# Patient Record
Sex: Male | Born: 1960 | Race: Black or African American | Hispanic: No | Marital: Married | Smoking: Current every day smoker
Health system: Southern US, Community
[De-identification: ages and names within clinical notes are randomized; demographics above are authoritative.]

## PROBLEM LIST (undated history)

## (undated) DIAGNOSIS — G809 Cerebral palsy, unspecified: Secondary | ICD-10-CM

## (undated) DIAGNOSIS — Z9889 Other specified postprocedural states: Secondary | ICD-10-CM

## (undated) DIAGNOSIS — M47812 Spondylosis without myelopathy or radiculopathy, cervical region: Secondary | ICD-10-CM

## (undated) DIAGNOSIS — R7303 Prediabetes: Secondary | ICD-10-CM

## (undated) DIAGNOSIS — Z8719 Personal history of other diseases of the digestive system: Secondary | ICD-10-CM

## (undated) DIAGNOSIS — M419 Scoliosis, unspecified: Secondary | ICD-10-CM

## (undated) DIAGNOSIS — M1990 Unspecified osteoarthritis, unspecified site: Secondary | ICD-10-CM

## (undated) DIAGNOSIS — M5412 Radiculopathy, cervical region: Secondary | ICD-10-CM

## (undated) DIAGNOSIS — M25511 Pain in right shoulder: Secondary | ICD-10-CM

## (undated) DIAGNOSIS — M549 Dorsalgia, unspecified: Secondary | ICD-10-CM

## (undated) DIAGNOSIS — G8929 Other chronic pain: Secondary | ICD-10-CM

## (undated) DIAGNOSIS — R262 Difficulty in walking, not elsewhere classified: Secondary | ICD-10-CM

## (undated) DIAGNOSIS — F17200 Nicotine dependence, unspecified, uncomplicated: Secondary | ICD-10-CM

## (undated) DIAGNOSIS — M791 Myalgia, unspecified site: Secondary | ICD-10-CM

## (undated) DIAGNOSIS — M545 Low back pain, unspecified: Secondary | ICD-10-CM

## (undated) HISTORY — PX: HERNIA REPAIR: SHX51

## (undated) HISTORY — PX: SHOULDER SURGERY: SHX246

## (undated) HISTORY — PX: LEG SURGERY: SHX1003

## (undated) HISTORY — PX: BACK SURGERY: SHX140

## (undated) HISTORY — DX: Unspecified osteoarthritis, unspecified site: M19.90

## (undated) HISTORY — PX: FRACTURE SURGERY: SHX138

## (undated) HISTORY — DX: Dorsalgia, unspecified: M54.9

## (undated) HISTORY — PX: TRACHEOSTOMY: SHX5626

## (undated) HISTORY — PX: HX KNEE SURGERY: 2100001320

## (undated) HISTORY — PX: HX BACK SURGERY: SHX140

---

## 1996-09-26 ENCOUNTER — Emergency Department
Admit: 1996-09-26 | Disposition: A | Discharge status: Home / Self Care | Payer: Self-pay | Admitting: Emergency Medicine

## 1996-12-14 ENCOUNTER — Other Ambulatory Visit: Payer: Self-pay

## 1996-12-14 ENCOUNTER — Ambulatory Visit: Admit: 1996-12-14 | Disposition: A | Discharge status: Home / Self Care | Payer: Self-pay | Admitting: Surgery

## 1997-01-19 ENCOUNTER — Ambulatory Visit: Admit: 1997-01-19 | Disposition: A | Discharge status: Home / Self Care | Payer: Self-pay | Admitting: Family Medicine

## 1997-10-04 ENCOUNTER — Ambulatory Visit: Admit: 1997-10-04 | Disposition: A | Discharge status: Home / Self Care | Payer: Self-pay | Admitting: Urology

## 1997-11-28 ENCOUNTER — Ambulatory Visit: Admit: 1997-11-28 | Disposition: A | Discharge status: Home / Self Care | Payer: Self-pay | Admitting: Urology

## 1998-04-29 ENCOUNTER — Emergency Department: Admit: 1998-04-29 | Disposition: A | Discharge status: Home / Self Care | Payer: Self-pay

## 1999-02-06 ENCOUNTER — Emergency Department
Admit: 1999-02-06 | Disposition: A | Discharge status: Home / Self Care | Payer: Self-pay | Admitting: Emergency Medicine

## 1999-10-07 DIAGNOSIS — Z8719 Personal history of other diseases of the digestive system: Secondary | ICD-10-CM

## 1999-10-07 DIAGNOSIS — Z9889 Other specified postprocedural states: Secondary | ICD-10-CM

## 1999-10-07 HISTORY — DX: Personal history of other diseases of the digestive system: Z87.19

## 1999-10-07 HISTORY — DX: Other specified postprocedural states: Z98.890

## 1999-11-28 ENCOUNTER — Emergency Department
Admit: 1999-11-28 | Disposition: A | Discharge status: Home / Self Care | Payer: Self-pay | Admitting: Emergency Medicine

## 2001-12-23 ENCOUNTER — Emergency Department
Admit: 2001-12-23 | Disposition: A | Discharge status: Home / Self Care | Payer: Self-pay | Admitting: Emergency Medicine

## 2002-03-30 ENCOUNTER — Ambulatory Visit: Admit: 2002-03-30 | Disposition: A | Discharge status: Home / Self Care | Payer: Self-pay | Admitting: Internal Medicine

## 2003-05-24 ENCOUNTER — Ambulatory Visit
Admission: AD | Admit: 2003-05-24 | Disposition: A | Discharge status: Home / Self Care | Payer: Self-pay | Source: Ambulatory Visit | Admitting: Family Medicine

## 2003-12-07 ENCOUNTER — Ambulatory Visit (INDEPENDENT_AMBULATORY_CARE_PROVIDER_SITE_OTHER): Admit: 2003-12-07 | Disposition: A | Discharge status: Home / Self Care | Payer: Self-pay | Source: Ambulatory Visit

## 2004-08-07 ENCOUNTER — Ambulatory Visit (INDEPENDENT_AMBULATORY_CARE_PROVIDER_SITE_OTHER): Admit: 2004-08-07 | Disposition: A | Discharge status: Home / Self Care | Payer: Self-pay | Source: Ambulatory Visit

## 2005-01-28 ENCOUNTER — Ambulatory Visit
Admission: RE | Admit: 2005-01-28 | Disposition: A | Discharge status: Home / Self Care | Payer: Self-pay | Source: Ambulatory Visit | Admitting: Cardiovascular Disease

## 2005-02-12 ENCOUNTER — Ambulatory Visit
Admission: RE | Admit: 2005-02-12 | Disposition: A | Discharge status: Home / Self Care | Payer: Self-pay | Source: Ambulatory Visit

## 2005-03-23 ENCOUNTER — Emergency Department
Admission: EM | Admit: 2005-03-23 | Disposition: A | Discharge status: Home / Self Care | Payer: Self-pay | Source: Ambulatory Visit

## 2005-06-23 ENCOUNTER — Emergency Department
Admission: EM | Admit: 2005-06-23 | Disposition: A | Discharge status: Home / Self Care | Payer: Self-pay | Source: Ambulatory Visit

## 2005-09-11 ENCOUNTER — Ambulatory Visit (INDEPENDENT_AMBULATORY_CARE_PROVIDER_SITE_OTHER): Admit: 2005-09-11 | Disposition: A | Discharge status: Home / Self Care | Payer: Self-pay | Source: Ambulatory Visit

## 2005-09-20 ENCOUNTER — Ambulatory Visit (INDEPENDENT_AMBULATORY_CARE_PROVIDER_SITE_OTHER): Admit: 2005-09-20 | Disposition: A | Discharge status: Home / Self Care | Payer: Self-pay | Source: Ambulatory Visit

## 2005-09-21 ENCOUNTER — Emergency Department
Admission: EM | Admit: 2005-09-21 | Disposition: A | Discharge status: Home / Self Care | Payer: Self-pay | Source: Ambulatory Visit

## 2005-09-22 ENCOUNTER — Emergency Department
Admission: EM | Admit: 2005-09-22 | Disposition: A | Discharge status: Home / Self Care | Payer: Self-pay | Source: Ambulatory Visit

## 2006-04-29 ENCOUNTER — Ambulatory Visit: Admit: 2006-04-29 | Disposition: A | Discharge status: Home / Self Care | Payer: Self-pay | Source: Ambulatory Visit

## 2006-04-29 ENCOUNTER — Ambulatory Visit (INDEPENDENT_AMBULATORY_CARE_PROVIDER_SITE_OTHER): Admit: 2006-04-29 | Disposition: A | Discharge status: Home / Self Care | Payer: Self-pay | Source: Ambulatory Visit

## 2006-06-23 ENCOUNTER — Ambulatory Visit
Admission: RE | Admit: 2006-06-23 | Disposition: A | Discharge status: Home / Self Care | Payer: Self-pay | Source: Ambulatory Visit

## 2008-04-14 ENCOUNTER — Ambulatory Visit: Admit: 2008-04-14 | Disposition: A | Discharge status: Home / Self Care | Payer: Self-pay | Source: Ambulatory Visit

## 2008-09-01 ENCOUNTER — Ambulatory Visit
Admission: RE | Admit: 2008-09-01 | Disposition: A | Discharge status: Home / Self Care | Payer: Self-pay | Source: Ambulatory Visit

## 2009-01-22 ENCOUNTER — Emergency Department
Admission: EM | Admit: 2009-01-22 | Disposition: A | Discharge status: Home / Self Care | Payer: Self-pay | Source: Ambulatory Visit

## 2009-08-15 ENCOUNTER — Ambulatory Visit (INDEPENDENT_AMBULATORY_CARE_PROVIDER_SITE_OTHER): Admit: 2009-08-15 | Disposition: A | Discharge status: Home / Self Care | Payer: Self-pay | Source: Ambulatory Visit

## 2009-11-27 ENCOUNTER — Emergency Department
Admission: EM | Admit: 2009-11-27 | Disposition: A | Discharge status: Home / Self Care | Payer: Self-pay | Source: Ambulatory Visit

## 2010-06-10 ENCOUNTER — Emergency Department
Admission: AD | Admit: 2010-06-10 | Discharge: 2010-06-10 | Disposition: A | Discharge status: Home / Self Care | Payer: Self-pay | Attending: Physician Assistant | Admitting: Physician Assistant

## 2010-08-06 ENCOUNTER — Emergency Department
Admission: EM | Admit: 2010-08-06 | Disposition: A | Discharge status: Home / Self Care | Payer: Self-pay | Source: Ambulatory Visit

## 2010-08-30 ENCOUNTER — Emergency Department
Admission: EM | Admit: 2010-08-30 | Disposition: A | Discharge status: Home / Self Care | Payer: Self-pay | Source: Ambulatory Visit

## 2010-09-10 ENCOUNTER — Ambulatory Visit
Admission: RE | Admit: 2010-09-10 | Disposition: A | Discharge status: Home / Self Care | Payer: Self-pay | Source: Ambulatory Visit

## 2010-10-05 ENCOUNTER — Emergency Department
Admission: EM | Admit: 2010-10-05 | Disposition: A | Discharge status: Home / Self Care | Payer: Self-pay | Source: Ambulatory Visit

## 2011-04-13 ENCOUNTER — Emergency Department
Admission: EM | Admit: 2011-04-13 | Disposition: A | Discharge status: Home / Self Care | Payer: Self-pay | Source: Ambulatory Visit

## 2011-05-24 ENCOUNTER — Emergency Department
Admission: EM | Admit: 2011-05-24 | Disposition: A | Discharge status: Home / Self Care | Payer: Self-pay | Source: Ambulatory Visit

## 2011-10-20 ENCOUNTER — Emergency Department
Admission: EM | Admit: 2011-10-20 | Disposition: A | Discharge status: Home / Self Care | Payer: Self-pay | Source: Ambulatory Visit

## 2011-11-02 ENCOUNTER — Emergency Department
Admission: EM | Admit: 2011-11-02 | Disposition: A | Discharge status: Home / Self Care | Payer: Self-pay | Source: Ambulatory Visit

## 2011-11-03 ENCOUNTER — Emergency Department (HOSPITAL_BASED_OUTPATIENT_CLINIC_OR_DEPARTMENT_OTHER)
Admission: EM | Admit: 2011-11-03 | Discharge: 2011-11-03 | Disposition: A | Discharge status: Home / Self Care | Payer: Self-pay

## 2011-11-03 ENCOUNTER — Emergency Department (HOSPITAL_BASED_OUTPATIENT_CLINIC_OR_DEPARTMENT_OTHER): Payer: Self-pay

## 2011-11-03 ENCOUNTER — Encounter (HOSPITAL_BASED_OUTPATIENT_CLINIC_OR_DEPARTMENT_OTHER): Payer: Self-pay

## 2011-11-03 DIAGNOSIS — W108XXA Fall (on) (from) other stairs and steps, initial encounter: Secondary | ICD-10-CM | POA: Insufficient documentation

## 2011-11-03 DIAGNOSIS — M25559 Pain in unspecified hip: Secondary | ICD-10-CM | POA: Insufficient documentation

## 2011-11-03 MED ORDER — DICLOFENAC SODIUM 75 MG TABLET,DELAYED RELEASE
75.00 mg | DELAYED_RELEASE_TABLET | Freq: Two times a day (BID) | ORAL | Status: DC
Start: 2011-11-03 — End: 2014-09-15

## 2011-11-03 MED ORDER — HYDROCODONE 5 MG-ACETAMINOPHEN 325 MG TABLET
1.00 | ORAL_TABLET | ORAL | Status: DC | PRN
Start: 2011-11-03 — End: 2014-09-15

## 2011-11-03 NOTE — ED Nurses Note (Signed)
NOTIFIED RHONDA IN XRAY

## 2011-11-03 NOTE — ED Nurses Note (Signed)
 Slipped on ice and fell down three steps. Pain to left hip and tailbone. Slight pain in left shoulder. No LOC.

## 2011-11-03 NOTE — ED Nurses Note (Signed)
DISCHARGE INSTRUCTIONS GIVEN TO PATIENT WITH RX AND FOLLOW UP WITH [facility] IF CONDITION WORSENS. PATIENT RECEPTIVE.

## 2011-11-04 NOTE — ED Provider Notes (Signed)
History of Present Illness   Date of Service: 11/04/2011    Patient Identification  Curtis Martin is a 51 y.o. male.    Patient information was obtained from patient  History/Exam limitations: none  Patient presented to the Emergency Department via private vehicle     Chief Complaint   Fall      HPI:    Pt is a 51 yo male who presents to the ED tonight c/o a fall injury today.  Pt tells me that he slipped on some ice and fell down 3 steps today.  He landed on the Left hip area and his buttock region where he is complaining of pain.  He's able to ambulate but with pain.  He denies any numbness, tingling, weakness to the legs.  No loss of bowel or bladder control.  No hematuria or flank pain.            History reviewed. No pertinent past medical history.  Past Surgical History   Procedure Date   . Hx knee surgery      No family history on file.  No current facility-administered medications for this encounter.     Current Outpatient Prescriptions   Medication Sig   . hydrocodone-Acetaminophen (NORCO) 5-325 mg Oral Tablet tablet Take 1-2 Tabs by mouth Every 4 hours as needed for Pain.   . diclofenac sodium (VOLTAREN) 75 mg Oral Tablet, Delayed Release (E.C.) Take 1 Tab (75 mg total) by mouth Twice daily with food. As needed for pain     Not on File  History     Social History   . Marital Status: Single     Spouse Name: N/A     Number of Children: N/A   . Years of Education: N/A     Occupational History   . Not on file.     Social History Main Topics   . Smoking status: Current Everyday Smoker -- 1.0 packs/day   . Smokeless tobacco: Not on file   . Alcohol Use: Yes   . Drug Use: No   . Sexually Active: Not on file     Other Topics Concern   . Not on file     Social History Narrative   . No narrative on file       Review of Systems    All other ROS normal or not mentioned by patient and considered normal unless indicated above within 12 systems.       Physical Exam      BP 154/98   Pulse 68   Temp 36.9 C (98.5 F)   Resp 16   Ht 1.626 m (5\' 4" )   Wt 56.246 kg (124 lb)   BMI 21.28 kg/m2   SpO2 96%    General: Well developed, well nourished; appears in good health    Back:  Examination of the back demonstrates mild diffuse tenderness along the midline lower lumbar spine and the coccyx region.  There is no bruising or swelling or open skin injury noted.  Pt has FROM of the Lumbar spine but with pain at extremes of flexion and extension.  Able to get up/down/ambulate but with some discomfort.       Extremities:  Examination of the left hip demonstrates tenderness over the greater trochanter of the femur.  He does have FROM of the hip and is able to get up/down/ambulate on his own.  He does have a slightly antalgic gait.      Cardiovascular:  pulses 2+  throughout; good cap refill    Skin: Skin warm and dry; no rash    Neurologic:  DTRs grossly normal; sensation intact to lower extremities    Psychiatric: AOx3 and normal mood/affect; behavior nl, thought content nl      Imaging:    Xrays of the Left hip and the Sacrum and Coccyx read by the radiologist demonstrates no acute findings  Xrays of the Lumbar spine read by the radiologist demonstrates:    Warnell Bureau on Accession: [id number] MRN: [id number] OrderingMD: Vanderbilt Ranieri-C Kalayla Shadden D     >>>>>> Prelim: Mild compression T12, age uncertain. Grade I spondylolisthesis L5-S1.      See_Notes     >>>>>> States:  By bsocks @1 /28/2013 5:13:19 PM: Needs Prelim    By jblanco @1 /28/2013 5:24:00 PM: Prelim from Rad - Positive    By Clarene Essex @1 /28/2013 5:27:06 PM: Finding Communicated to Patient/Guardian While in ED      >>>>>> Notes:  By bsocks @1 /28/2013 5:13:29 PM: PT FELL DOWN STAIRS TODAY      >>>>>> Final Communication:           EMERGENCY ROOM COURSE:   I discussed the pt's xray findings with him and re-examined him.  He has little to no tenderness along the lower thoracic spine midline on palpation and is unaware of any previous back injury.      Assessment  1.  Fall injury with Left hip contusion  2.  Sacral contusion  3.  T12 compression fracture - mild - likely chronic  4.  Grade 1 spondylolisthesis of L5-S1      Plan  1.  Ice to areas  2.  Referral to ortho - information given to the patient  3.  Norco 5/325 #20  4.  Voltaren 75mg  #20  5.  Return to ED if symptoms change/worsen            Orders Placed This Encounter   . CANCELED: XR LUMBAR SPINE AP/LAT/SPOT   . XR SACRUM AND COCCYX   . XR HIP LEFT SERIES   . XR LUMBAR SPINE SERIES   . hydrocodone-Acetaminophen (NORCO) 5-325 mg Oral Tablet tablet   . diclofenac sodium (VOLTAREN) 75 mg Oral Tablet, Delayed Release (E.C.)       Medication List  As of 11/04/2011 12:41 AM    START taking these medications           diclofenac sodium 75 mg Tbec    Commonly known as: VOLTAREN    Take 1 Tab (75 mg total) by mouth Twice daily with food. As needed for pain        hydrocodone-Acetaminophen 5-325 mg Tab tablet    Commonly known as: NORCO    Take 1-2 Tabs by mouth Every 4 hours as needed for Pain.               Where to get your medications         Information on where to get these meds is not yet available. Ask your nurse or doctor.           diclofenac sodium 75 mg Tbec    hydrocodone-Acetaminophen 5-325 mg Tab tablet                         Carmela Hurt, PA-C 11/04/2011, 12:41 AM

## 2011-11-08 ENCOUNTER — Emergency Department
Admission: EM | Admit: 2011-11-08 | Disposition: A | Discharge status: Home / Self Care | Payer: Self-pay | Source: Emergency Department | Admitting: Emergency Medicine

## 2012-01-24 ENCOUNTER — Emergency Department
Admission: EM | Admit: 2012-01-24 | Disposition: A | Discharge status: Home / Self Care | Payer: Self-pay | Source: Ambulatory Visit

## 2012-04-25 ENCOUNTER — Emergency Department
Admission: EM | Admit: 2012-04-25 | Disposition: A | Discharge status: Home / Self Care | Payer: Self-pay | Source: Ambulatory Visit

## 2012-05-24 ENCOUNTER — Emergency Department
Admission: EM | Admit: 2012-05-24 | Disposition: A | Discharge status: Home / Self Care | Payer: Self-pay | Source: Ambulatory Visit

## 2012-06-20 ENCOUNTER — Emergency Department
Admission: EM | Admit: 2012-06-20 | Disposition: A | Discharge status: Home / Self Care | Payer: Self-pay | Source: Ambulatory Visit

## 2012-09-03 ENCOUNTER — Ambulatory Visit
Admission: RE | Admit: 2012-09-03 | Disposition: A | Discharge status: Home / Self Care | Payer: Self-pay | Source: Ambulatory Visit

## 2012-10-06 ENCOUNTER — Emergency Department
Admission: EM | Admit: 2012-10-06 | Disposition: A | Discharge status: Home / Self Care | Payer: Self-pay | Source: Ambulatory Visit

## 2012-11-11 LAB — CBC AND DIFFERENTIAL
Basophils %: 0.8 % (ref 0.0–3.0)
Basophils Absolute: 0 10*3/uL (ref 0.0–0.3)
Eosinophils %: 4.9 % (ref 0.0–7.0)
Eosinophils Absolute: 0.2 10*3/uL (ref 0.0–0.8)
Hematocrit: 40.5 % (ref 39.0–52.5)
Hemoglobin: 13.4 gm/dL (ref 13.0–17.5)
Lymphocytes Absolute: 2.5 10*3/uL (ref 0.6–5.1)
Lymphocytes: 52.2 % — ABNORMAL HIGH (ref 15.0–46.0)
MCH: 27 pg — ABNORMAL LOW (ref 28–35)
MCHC: 33 gm/dL (ref 32–36)
MCV: 81 fL (ref 80–100)
MPV: 9.2 fL (ref 6.0–10.0)
Monocytes Absolute: 0.4 10*3/uL (ref 0.1–1.7)
Monocytes: 7.8 % (ref 3.0–15.0)
Neutrophils %: 34.3 % — ABNORMAL LOW (ref 42.0–78.0)
Neutrophils Absolute: 1.7 10*3/uL (ref 1.7–8.6)
PLT CT: 195 10*3/uL (ref 130–440)
RBC: 5.01 10*6/uL (ref 4.00–5.70)
RDW: 12 % (ref 11.0–14.0)
WBC: 4.9 10*3/uL (ref 4.0–11.0)

## 2012-11-11 LAB — COMPREHENSIVE METABOLIC PANEL
ALT: 21 U/L (ref 0–55)
AST (SGOT): 22 U/L (ref 10–42)
Albumin/Globulin Ratio: 1.31 Ratio (ref 0.70–1.50)
Albumin: 3.8 gm/dL (ref 3.5–5.0)
Alkaline Phosphatase: 77 U/L (ref 40–145)
Anion Gap: 13.9 mMol/L (ref 7.0–18.0)
BUN / Creatinine Ratio: 16.7 Ratio (ref 10.0–30.0)
BUN: 15 mg/dL (ref 7–22)
Bilirubin, Total: 0.4 mg/dL (ref 0.1–1.2)
CO2: 24.6 mMol/L (ref 20.0–30.0)
Calcium: 9.4 mg/dL (ref 8.5–10.5)
Chloride: 104 mMol/L (ref 98–110)
Creatinine: 0.9 mg/dL (ref 0.80–1.30)
EGFR: >60 mL/min/{1.73_m2}
Globulin: 2.9 gm/dL (ref 2.0–4.0)
Glucose: 89 mg/dL (ref 70–99)
Osmolality Calc: 278 mOsm/kg (ref 275–300)
Potassium: 3.5 mMol/L (ref 3.5–5.3)
Protein, Total: 6.7 gm/dL (ref 6.0–8.3)
Sodium: 139 mMol/L (ref 136–147)

## 2012-11-11 LAB — URINALYSIS
Bilirubin, UA: NEGATIVE mg/dL
Blood, UA: NEGATIVE mg/dL
Glucose, UA: NEGATIVE mg/dL
Ketones UA: NEGATIVE mg/dL
Leukocyte Esterase, UA: NEGATIVE Leu/uL
Nitrite, UA: NEGATIVE
RBC, UA: 1 /hpf (ref 0–4)
Squam Epithel, UA: <1 /hpf (ref 0–2)
Urine Specific Gravity: 1.017 (ref 1.001–1.040)
Urobilinogen, UA: NORMAL mg/dL
WBC, UA: 1 /hpf (ref 0–4)
pH, Urine: 6.5 pH (ref 5.0–8.0)

## 2012-11-11 LAB — HIV AG/AB 4TH GENERATION: HIV 1/2 Antibody: NONREACTIVE

## 2012-11-11 LAB — THYROID STIMULATING HORMONE (TSH), REFLEX ON ABNORMAL TO FREE T4, SERUM: TSH: 2.73 u[IU]/mL (ref 0.40–4.20)

## 2012-11-11 LAB — LIPID PANEL
Cholesterol: 169 mg/dL (ref 75–199)
Coronary Heart Disease Risk: 3.76
HDL: 45 mg/dL (ref 40–55)
LDL Calculated: 91 mg/dL
Triglycerides: 165 mg/dL — ABNORMAL HIGH (ref 10–150)
VLDL: 33 (ref 0–40)

## 2012-11-11 LAB — HEMOGLOBIN A1C: Hgb A1C, %: 5.5 %

## 2012-11-11 LAB — VH HEPATITIS C RNA QUANTITATIVE PCR
HCV RNA PCR Quantitative: <1.18 log IU/mL (ref <1.18)
HCV RNA, PCR, Quant: <15 IU/mL (ref <15)

## 2012-11-11 LAB — VITAMIN D,25 OH,TOTAL: Vitamin D 25-Hydroxy: 15 ng/mL — ABNORMAL LOW (ref 30–80)

## 2013-05-24 ENCOUNTER — Emergency Department
Admission: EM | Admit: 2013-05-24 | Discharge: 2013-05-24 | Disposition: A | Discharge status: Home / Self Care | Payer: Self-pay | Attending: Emergency Medicine | Admitting: Emergency Medicine

## 2013-05-24 ENCOUNTER — Emergency Department: Payer: Self-pay

## 2013-05-24 DIAGNOSIS — F17200 Nicotine dependence, unspecified, uncomplicated: Secondary | ICD-10-CM | POA: Insufficient documentation

## 2013-05-24 DIAGNOSIS — X58XXXA Exposure to other specified factors, initial encounter: Secondary | ICD-10-CM | POA: Insufficient documentation

## 2013-05-24 DIAGNOSIS — R0789 Other chest pain: Secondary | ICD-10-CM | POA: Insufficient documentation

## 2013-05-24 DIAGNOSIS — S29011A Strain of muscle and tendon of front wall of thorax, initial encounter: Secondary | ICD-10-CM

## 2013-05-24 DIAGNOSIS — S2341XA Sprain of ribs, initial encounter: Secondary | ICD-10-CM | POA: Insufficient documentation

## 2013-05-24 HISTORY — DX: Unspecified osteoarthritis, unspecified site: M19.90

## 2013-05-24 MED ORDER — KETOROLAC TROMETHAMINE 30 MG/ML IJ SOLN
INTRAMUSCULAR | Status: AC
Start: 2013-05-24 — End: ?
  Filled 2013-05-24: qty 2

## 2013-05-24 MED ORDER — DIFLUNISAL 500 MG PO TABS
500.0000 mg | ORAL_TABLET | Freq: Two times a day (BID) | ORAL | Status: DC
Start: 2013-05-24 — End: 2013-05-24

## 2013-05-24 MED ORDER — KETOROLAC TROMETHAMINE 30 MG/ML IJ SOLN
60.0000 mg | Freq: Once | INTRAMUSCULAR | Status: AC
Start: 2013-05-24 — End: 2013-05-24
  Administered 2013-05-24: 60 mg via INTRAMUSCULAR

## 2013-05-24 NOTE — ED Provider Notes (Addendum)
Physician/Midlevel provider first contact with patient: 05/24/13 0907         History     Chief Complaint   Patient presents with   . Rib pain     Patient is a 52 y.o. male presenting with chest pain. The history is provided by the patient.   Chest Pain  Episode onset: patient notes that at 2 AM he awakened to sneeze tried to stay for the sneeze and felt severe right sided rib pain. Chest pain occurs constantly. The chest pain is unchanged. The pain is associated with coughing. The severity of the pain is severe. The quality of the pain is described as sharp. The pain does not radiate. Chest pain is worsened by certain positions and deep breathing (twisting and turning torso). Pertinent negatives for primary symptoms include no fever, no fatigue, no syncope, no shortness of breath, no cough, no wheezing, no palpitations, no abdominal pain, no nausea, no vomiting, no dizziness and no altered mental status. Primary symptoms comment: patient describes an intermittent unchanged smoker's cough   Pertinent negatives for associated symptoms include no claudication, no diaphoresis, no lower extremity edema, no near-syncope, no numbness, no orthopnea, no paroxysmal nocturnal dyspnea and no weakness. He tried nothing for the symptoms. There are no known risk factors.   His past medical history is significant for recent injury.   Pertinent negatives for past medical history include no aneurysm, no anxiety/panic attacks, no aortic aneurysm, no aortic dissection, no CAD, no cancer, no COPD, no CHF, no diabetes, no DVT, no MI, no PE and no seizures. Past medical history comments: patient notes he is chronically disabled from hip and back pain         Past Medical History   Diagnosis Date   . Arthritis        Past Surgical History   Procedure Date   . Hernia repair    . Tracheostomy    . Leg surgery      left leg surgery as child from CP       No family history on file.    Social  History   Substance Use Topics   . Smoking  status: Current Every Day Smoker -- 1.0 packs/day   . Smokeless tobacco: Not on file   . Alcohol Use: No       .     No Known Allergies    Current/Home Medications    NAPROXEN (NAPROSYN) 500 MG TABLET    Take 500 mg by mouth daily as needed.    TRAMADOL (ULTRAM) 50 MG TABLET    Take 50 mg by mouth 2 (two) times daily as needed.        Review of Systems   Constitutional: Negative for fever, diaphoresis and fatigue.   HENT: Negative for ear pain, congestion, sore throat, rhinorrhea and neck stiffness.    Eyes: Negative for discharge and redness.   Respiratory: Negative for cough, chest tightness, shortness of breath and wheezing.    Cardiovascular: Positive for chest pain. Negative for palpitations, orthopnea, claudication, leg swelling, syncope and near-syncope.   Gastrointestinal: Negative for nausea, vomiting, abdominal pain, diarrhea, constipation and blood in stool.   Genitourinary: Negative for dysuria, urgency, frequency, flank pain, decreased urine volume, penile swelling, scrotal swelling and testicular pain.   Musculoskeletal: Negative for myalgias and joint swelling.   Skin: Negative for rash.   Neurological: Negative for dizziness, seizures, weakness, numbness and headaches.   Hematological: Negative for adenopathy. Does not bruise/bleed easily.  Psychiatric/Behavioral: Negative for suicidal ideas.       Physical Exam    BP 111/73  Pulse 78  Temp 98.2 F (36.8 C)  Resp 16  SpO2 100%    Physical Exam   Nursing note and vitals reviewed.  Constitutional: He is oriented to person, place, and time. Vital signs are normal. He appears well-developed and well-nourished. No distress.   HENT:   Head: Normocephalic and atraumatic.   Right Ear: External ear normal.   Left Ear: External ear normal.   Nose: Nose normal.   Mouth/Throat: Oropharynx is clear and moist. No oropharyngeal exudate.   Eyes: Conjunctivae normal and EOM are normal. Pupils are equal, round, and reactive to light. Right eye exhibits no  discharge. Left eye exhibits no discharge. No scleral icterus.   Neck: Normal range of motion. Neck supple. No JVD present. No tracheal deviation present. No mass and no thyromegaly present.   Cardiovascular: Normal rate, regular rhythm, normal heart sounds and intact distal pulses.  Exam reveals no gallop and no friction rub.    No murmur heard.  Pulmonary/Chest: Effort normal and breath sounds normal. No accessory muscle usage or stridor. Not tachypneic. No respiratory distress. He has no wheezes. He has no rales. Chest wall is not dull to percussion. He exhibits tenderness and bony tenderness. He exhibits no mass, no crepitus, no deformity, no swelling and no retraction.       Abdominal: Soft. Bowel sounds are normal. He exhibits no distension and no mass. There is no tenderness. There is no rebound and no guarding.   Musculoskeletal: Normal range of motion. He exhibits no edema and no tenderness.   Lymphadenopathy:     He has no cervical adenopathy.   Neurological: He is alert and oriented to person, place, and time. He has normal strength. No cranial nerve deficit. He exhibits normal muscle tone. Coordination normal.   Skin: Skin is warm and dry. No ecchymosis, no lesion, no petechiae and no rash noted. He is not diaphoretic. No cyanosis or erythema. No pallor. Nails show no clubbing.   Psychiatric: He has a normal mood and affect. His behavior is normal.       MDM and ED Course     ED Medication Orders      Start     Status Ordering Provider    05/24/13 [phone]   ketorolac (TORADOL) injection 60 mg   Once in ED      Route: Intramuscular  Ordered Dose: 60 mg         Last MAR action:  Given Sonia Baller R                 MDM  Number of Diagnoses or Management Options  Acute chest wall pain: new and requires workup  Intercostal muscle strain, initial encounter: new and requires workup  Diagnosis management comments: The patient presented with CP and is clinically well appearing. Symptoms are not suggestive of  pulmonary embolus, cardiac ischemia, aortic dissection, or other serious etiology. These diagnoses have been considered and excluded clinically.  Given the low risk of these diagnoses further testing and evaluation does not appear to be indicated at this time. Diagnostic impression and plan were discussed and agreed upon with the patient and/or family.  Results of lab/radiology tests were reviewed and discussed with the patient and/or family. All questions were answered and concerns addressed. Chest pain precautions were given. I emphasized the need for close follow-up with the patient's primary care physician,  and that should the symptoms worsen or change in anyway that they are to return to the ER immediately for re-evaluation.        Amount and/or Complexity of Data Reviewed  Tests in the radiology section of CPT: ordered and reviewed    Risk of Complications, Morbidity, and/or Mortality  Presenting problems: moderate  Diagnostic procedures: low  Management options: low    Patient Progress  Patient progress: improved        Procedures    Clinical Impression & Disposition     Clinical Impression  Final diagnoses:   Acute chest wall pain   Intercostal muscle strain, initial encounter        ED Disposition     Discharge [name] discharge to home/self care.    Condition at discharge: Good             New Prescriptions    No medications on file               Fabian Sharp, MD  05/24/13 1052    Fabian Sharp, MD  05/24/13 1053

## 2013-05-24 NOTE — Discharge Instructions (Signed)
Chest Pain, Noncardiac    Based on your visit today, the exact cause of your chest pain is not certain. Your condition does not seem serious and your pain does not appear to be coming from your heart. However, sometimes the signs of a serious problem take more time to appear. Therefore, please watch for the warning signs listed below.  Home Care:  1. Rest today and avoid strenuous activity.  2. Take any prescribed medicine as directed.  Follow Up  with your doctor or this facility as instructed or if you do not start to feel better within 24 hours.  Get Prompt Medical Attention  if any of the following occur:   A change in the type of pain: if it feels different, becomes more severe, lasts longer, or begins to spread into your shoulder, arm, neck, jaw or back   Shortness of breath or increased pain with breathing   Cough with dark colored sputum (phlegm) or blood   Weakness, dizziness, or fainting   Fever of 100.4F (38C) or higher, or as directed by your healthcare provider   Swelling, pain or redness in one leg   2000-2014 [employer], [address], [place] [address]. All rights reserved. This information is not intended as a substitute for professional medical care. Always follow your healthcare professional's instructions.      Chest Pain, Uncertain Cause  Chest pain can happen for a number of reasons. Sometimes the cause can not be determined. If yourcondition does not seem serious, and your pain does not appear to be coming from your heart, your doctor may recommend watching it closely. Sometimes the signs of a serious problem take more time to appear. Therefore, watch for the warning signs listed below.  Home care  After your visit, follow these recommendations:   Rest today and avoid strenuous activity.   Take any prescribed medicine as directed.  Follow-up care  Follow up with your doctor or this facility as instructed or if you do not start to feel better within 24 hours.  Call  [phone]  Get immediate medical attention if any of the following occur:   A change in the type of pain: if it feels different, becomes more severe, lasts longer, or begins to spread into your shoulder, arm, neck, jaw or back   Shortness of breath or increased pain with breathing   Weakness, dizziness, or fainting   Rapid heart beat  Get prompt medical ttention  Call your doctor right away if any of the following occur:   Cough with dark colored sputum (phlegm) or blood   Fever of 100.4F(38C) or higher, or as directed by your health care provider   Swelling, pain or redness in one leg   2000-2014 [employer], [address], [place] [address]. All rights reserved. This information is not intended as a substitute for professional medical care. Always follow your healthcare professional's instructions.

## 2013-05-24 NOTE — ED Notes (Signed)
Pt sneezed around 0230 this am and felt a "pop crack" in right rib area, now reports pain in right ribs which increases with movement and deep breathing

## 2013-05-24 NOTE — ED Notes (Signed)
Pt laying on left side with visitor at bedside. Pt reports his pain is improving since his Toradol injection.

## 2013-05-24 NOTE — ED Notes (Signed)
Bed:C11-A<BR> Expected date:<BR> Expected time:<BR> Means of arrival:<BR> Comments:<BR> EMS

## 2013-08-01 ENCOUNTER — Emergency Department
Admission: EM | Admit: 2013-08-01 | Discharge: 2013-08-01 | Disposition: A | Discharge status: Home / Self Care | Payer: Self-pay | Attending: Emergency Medicine | Admitting: Emergency Medicine

## 2013-08-01 ENCOUNTER — Emergency Department: Payer: Self-pay

## 2013-08-01 DIAGNOSIS — F17200 Nicotine dependence, unspecified, uncomplicated: Secondary | ICD-10-CM | POA: Insufficient documentation

## 2013-08-01 DIAGNOSIS — K089 Disorder of teeth and supporting structures, unspecified: Secondary | ICD-10-CM | POA: Insufficient documentation

## 2013-08-01 DIAGNOSIS — K0889 Other specified disorders of teeth and supporting structures: Secondary | ICD-10-CM

## 2013-08-01 HISTORY — DX: Myalgia, unspecified site: M79.10

## 2013-08-01 MED ORDER — PENICILLIN V POTASSIUM 250 MG PO TABS
500.00 mg | ORAL_TABLET | Freq: Once | ORAL | Status: AC
Start: 2013-08-01 — End: 2013-08-01
  Administered 2013-08-01: 500 mg via ORAL

## 2013-08-01 MED ORDER — ACETAMINOPHEN-CODEINE #3 300-30 MG PO TABS
1.00 | ORAL_TABLET | ORAL | Status: DC | PRN
Start: 2013-08-01 — End: 2013-08-26

## 2013-08-01 MED ORDER — PENICILLIN V POTASSIUM 250 MG PO TABS
ORAL_TABLET | ORAL | Status: AC
Start: 2013-08-01 — End: ?
  Filled 2013-08-01: qty 2

## 2013-08-01 MED ORDER — PENICILLIN V POTASSIUM 500 MG PO TABS
500.00 mg | ORAL_TABLET | Freq: Four times a day (QID) | ORAL | Status: AC
Start: 2013-08-01 — End: 2013-08-08

## 2013-08-01 NOTE — Discharge Instructions (Signed)
Dental Pain    A crack or cavity in the tooth, which exposes the sensitive inner area of the tooth can cause tooth pain. An infection in the gum or the root of the tooth can cause pain and swelling. The pain is often made worse by drinking hot or cold fluids, or biting on hard foods. Pain may spread from the tooth to the ear or jaw on the same side.  Home Care:  1. Avoid hot and cold foods and liquids since your tooth may be sensitive to temperature changes.  2. If your tooth is chipped or cracked, or if there is a large open cavity, apply OIL OF CLOVES (available over-the-counter in drug stores) directly to the tooth to reduce pain. Some pharmacies carry an over-the-counter "toothache kit." This contains a paste, which can be applied over the exposed tooth to decrease sensitivity.  3. A cold pack on your jaw over the sore area may help reduce pain.  4. You may use acetaminophen (Tylenol) or ibuprofen (Motrin, Advil) to control pain, unless another medicine was prescribed. [ NOTE: If you have chronic liver or kidney disease or ever had a stomach ulcer or GI bleeding, talk with your doctor before using these medicines.]  5. If you have signs of an infection, an antibiotic will be given. Take it as directed.  Follow-Up  as directed with a dentist. Your pain may go away with the treatment given. However, only a dentist can fully evaluate and treat the cause and prevent the pain from coming back again.  TOOTHACHE IS A SIGN OF DISEASE IN YOUR TOOTH AND SHOULD BE EXAMINED AND TREATED BY A DENTIST.  Get Prompt Medical Attention  if any of the following occur:   Your face becomes swollen or red   Pain worsens or spreads to the neck   Fever over 100.4 F (38.0 C)   Unusual drowsiness; headache or stiff neck; weakness or fainting   Pus drains from the tooth   Difficulty swallowing or breathing   2000-2014 [employer], [address], [place] [address]. All rights reserved. This information is not  intended as a substitute for professional medical care. Always follow your healthcare professional's instructions.

## 2013-08-01 NOTE — ED Provider Notes (Signed)
Physician/Midlevel provider first contact with patient: 08/01/13 1346         [facility]  EMERGENCY DEPARTMENT  History and Physical Exam       Patient Name: [name] [name],[name] [name]  Encounter Date:  08/01/2013  Treating Provider: Boykin Peek, PA-C  Supervising Physician: Raiford Simmonds, MD  PCP: Bobby Rumpf, MD  Patient DOB:  11-21-1960  MRN:  [id number]  Room:  E53/E53-A      History of Presenting Illness     Chief complaint: Dental Pain    HPI/ROS is limited by: none  HPI/ROS given by: patient    Location: L lower 1st bicuspid  Duration: 3 days  Severity: moderate    [name] [name] is a 52 y.o. male who presents with dental pain. Progressive over past 3 days. Radiates to L ear. Contacted [facility] who set up appt with their dental services this Friday. Denies fever, chills or other complaint.       Review of Systems     Review of Systems   Constitutional: Negative for fever and chills.   HENT: Positive for dental problem and ear pain. Negative for facial swelling and trouble swallowing.    Respiratory: Negative for choking.    Musculoskeletal: Negative for neck pain.   Psychiatric/Behavioral: Negative for confusion and agitation.          Allergies & Medications     Pt  has no known allergies.    Current/Home Medications    No medications on file        Past Medical History     Pt has a past medical history of Muscle pain.     Past Surgical History     Pt  has past surgical history that includes Fracture surgery.     Family History     The family history is not on file.     Social History     Pt reports that he has been smoking.  He does not have any smokeless tobacco history on file. He reports that he does not drink alcohol or use illicit drugs.     Physical Exam     Blood pressure 129/81, pulse 99, temperature 97.5 F (36.4 C), resp. rate 20, height 1.6 m, weight 53 kg, SpO2 100.00%.    Constitutional: Well developed, well nourished, active, in no apparent distress.  HENT:   Head: Normocephalic,  atraumatic  Ears: No external lesions.  Nose: No external lesions. No epistaxis or drainage.  Eyes: PERRL. No scleral icterus. No conjunctival injection. EOMI.  Neck: Trachea is midline. No JVD. Normal range of motion. No apparent masses.  Mouth: The L lower 1st premolar is tender to percussion. There is an intact filling in the crown of the tooth. There is no swelling, fluctuance, or drainage from the adjacent periodontal structures. No facial, submandibular, or sublingual swelling noted.   Cardiovascular: Regular rhythm, S1 normal and S2 normal.    No murmur heard.  Pulmonary/Chest: Effort normal. Lungs clear to auscultation bilaterally.   Abdominal: Soft, non-tender, non-distended. No masses.   Genitourinay/Anorectal: Defferred  Musculoskeletal: Normal range of motion. No deformity or apparent injury.   Neurological: Pt is alert. Cranial nerves are grossly intact. Moving all extremities without apparent deficit.   Psychiatric: Affect is appropriate. There is no agitation.   Skin: Skin is warm, dry, well perfused. No rash noted. No cyanosis. No pallor. No apparent wound.       Diagnostic Results     The results of the diagnostic studies  below have been reviewed by myself:    Labs  Results     ** No Results found for the last 24 hours. **          Radiologic Studies  No results found.    EKG: none       Medical Decision Making     Pt will f/u with [facility] dental on Friday as already scheduled.     History and exam suggest pulpitis or possibly periapical abscess. There was no evidence of deep space infection such as trismus, dysphagia, submandibular, sublingual, or pharyngeal swelling, neck pain, or fever. A dental block was offered and was accepted by the patient. The patient was advised to follow up with a dentist as soon as possible, to take antibiotics as directed, and to return for new or worsening symptoms, especially symptoms consistent with abscess development or deep space infection such as trismus, dysphagia,  submandibular, sublingual, or pharyngeal swelling, neck pain, or fever. Pt understands plan and follow up instructions. Questions were invited and answered.     In addition to the above history, please see nursing notes. Allergies, meds, past medical, family, social hx, and the results of the diagnostic studies performed have been reviewed by myself.        This chart was generated by an EMR and may contain errors or omissions not intended by the user.     Procedures / Critical Care     NERVE BLOCK   By Boykin Peek, PA  2:53 PM    Procedure: inferior alveolar nerve block  Location: L inferior alveolar nerve  Indication: severe pain  Ultrasound: no    Verbal consent.  Patient identified and location of block verified.  Appropriate sterile precautions observed with gloves.  Marcaine 0.75% with epi given with 27-ga needle.  Aspiration prior to injection and negative for blood.  Patient tolerated procedure well with no apparent complications.    Good effect       Diagnosis / Disposition     Clinical Impression  1. Pain, dental        Disposition  ED Disposition     Discharge [name] discharge to home/self care.    Condition at disposition: Stable              Follow up for Discharged Patients  [facility]  [address]  [place] [address]      As scheduled      Prescriptions for Discharged Patients  New Prescriptions    ACETAMINOPHEN-CODEINE (TYLENOL #3) 300-30 MG PER TABLET    Take 1 tablet by mouth every 4 (four) hours as needed for Pain.    PENICILLIN V POTASSIUM (VEETID) 500 MG TABLET    Take 1 tablet (500 mg total) by mouth 4 (four) times daily.                  Boykin Peek, [place]  08/01/13 1454

## 2013-08-01 NOTE — ED Notes (Signed)
Pt c/o of LL tooth pain that started 3 days ago. Pt [facility] appt at [facility] on [facility]. Pt states radiates up to ear.

## 2013-08-26 ENCOUNTER — Emergency Department
Admission: EM | Admit: 2013-08-26 | Discharge: 2013-08-26 | Disposition: A | Discharge status: Home / Self Care | Payer: Self-pay | Attending: Emergency Medicine | Admitting: Emergency Medicine

## 2013-08-26 ENCOUNTER — Emergency Department: Payer: Self-pay

## 2013-08-26 DIAGNOSIS — K047 Periapical abscess without sinus: Secondary | ICD-10-CM | POA: Insufficient documentation

## 2013-08-26 MED ORDER — CLINDAMYCIN PHOSPHATE IN D5W 600 MG/50ML IV SOLN
INTRAVENOUS | Status: AC
Start: 2013-08-26 — End: ?
  Filled 2013-08-26: qty 50

## 2013-08-26 MED ORDER — ONDANSETRON HCL 4 MG/2ML IJ SOLN
INTRAMUSCULAR | Status: AC
Start: 2013-08-26 — End: ?
  Filled 2013-08-26: qty 2

## 2013-08-26 MED ORDER — BUPIVACAINE HCL (PF) 0.75 % IJ SOLN
10.00 mL | Freq: Once | INTRAMUSCULAR | Status: AC
Start: 2013-08-26 — End: 2013-08-26
  Administered 2013-08-26: 10 mL via PERINEURAL

## 2013-08-26 MED ORDER — ONDANSETRON HCL 4 MG/2ML IJ SOLN
4.00 mg | Freq: Once | INTRAMUSCULAR | Status: AC
Start: 2013-08-26 — End: 2013-08-26
  Administered 2013-08-26: 4 mg via INTRAVENOUS

## 2013-08-26 MED ORDER — BUPIVACAINE HCL (PF) 0.75 % IJ SOLN
INTRAMUSCULAR | Status: AC
Start: 2013-08-26 — End: ?
  Filled 2013-08-26: qty 10

## 2013-08-26 MED ORDER — CLINDAMYCIN PHOSPHATE IN D5W 600 MG/50ML IV SOLN
600.00 mg | Freq: Once | INTRAVENOUS | Status: AC
Start: 2013-08-26 — End: 2013-08-26
  Administered 2013-08-26: 600 mg via INTRAVENOUS

## 2013-08-26 MED ORDER — KETOROLAC TROMETHAMINE 30 MG/ML IJ SOLN
30.00 mg | Freq: Once | INTRAMUSCULAR | Status: AC
Start: 2013-08-26 — End: 2013-08-26
  Administered 2013-08-26: 30 mg via INTRAVENOUS

## 2013-08-26 MED ORDER — TRAMADOL HCL 50 MG PO TABS
50.00 mg | ORAL_TABLET | Freq: Four times a day (QID) | ORAL | Status: DC | PRN
Start: 2013-08-26 — End: 2014-04-03

## 2013-08-26 MED ORDER — CLINDAMYCIN HCL 300 MG PO CAPS
300.00 mg | ORAL_CAPSULE | Freq: Three times a day (TID) | ORAL | Status: AC
Start: 2013-08-26 — End: 2013-09-02

## 2013-08-26 MED ORDER — KETOROLAC TROMETHAMINE 15 MG/ML IJ SOLN
INTRAMUSCULAR | Status: AC
Start: 2013-08-26 — End: ?
  Filled 2013-08-26: qty 2

## 2013-08-26 NOTE — Discharge Instructions (Signed)
Dental Abscess  An abscess is a sac of pus. A dental abscess forms when a tooth or the tissue around it becomes infected with bacteria. The bacteria can enter through a cavity or a crack in a tooth. It can also infect the gum tissue or bone around a tooth. An untreated abscess can cause the loss of the tooth. It can even spread to other parts of the body and become life threatening.    Symptoms of a Dental Abscess Include:   Toothache, often severe   Tooth pain with hot, cold, or pressure   Pain in the gums, cheek, or jaw   Bad breath or bitter taste in the mouth   Trouble swallowing or opening the mouth   Fever   Swollen or enlarged glands in the neck  Diagnosing a Dental Abscess  An abscess is diagnosed by looking at your teeth and gums. You will be told if any tests, such as dental x-rays, are needed.  Treating a Dental Abscess  Treatments for a dental abscess may include the following:   Antibiotic medications to treat the underlying infection.   Pain relievers to help you feel more comfortable. Your doctor may prescribe a medication for you. Or, use over-the-counter pain relievers such as acetaminophen or ibuprofen.   Warm saltwater rinses to soothe discomfort and help clear away pus.   Root canal surgery if needed to save the tooth. With a root canal, the infected part of the tooth is removed. A special substance is then used to fill the empty space in the tooth.   Drainage of the abscess if needed. Incisions are made to allow the infected material to drain from the tooth.   Removal of the tooth in cases of severe infection that can't be treated another way.  If the infection is severe, has spread, or doesn't respond to treatment, you may need to be admitted to a hospital.      When to Call the Dentist  Call your dentist right away if you have any of the following:   Fever of 100.25F or higher   Increased pain, redness, drainage, or swelling in the treated area   Swelling of the face or  jawbone   Pain that cannot be controlled with medications   Preventing Dental Abscess  To prevent another abscess in the future, keep your teeth clean and healthy. Brush twice a day and floss at least once daily. See your dentist for regular tooth cleanings. And avoid sugary foods and drinks that can lead to tooth decay.   [address] [place], [place] [address]. All rights reserved. This information is not intended as a substitute for professional medical care. Always follow your healthcare professional's instructions.    Local Dental Clinics    [facility]  [address]  [place], [place]  [address]  [phone]      [facility]  [address]  [place], [place]  [address]  [phone]      [facility]  [address] [place], [place] [address]  [phone]    Thank you for choosing [facility] for your emergency care needs. We strive to provide EXCELLENT care to you and your family.      YOUR ACCURATE CONTACT INFORMATION IS VERY IMPORTANT    Before leaving please check with registration to make sure we have an up-to-date contact number. A Toll-free post discharge customer service number is available to  update your registration/insurance information as well as answer any billing questions or concerns. That number is [phone].       IF YOU DO NOT CONTINUE TO IMPROVE OR YOUR CONDITION WORSENS, PLEASE CONTACT YOUR DOCTOR OR RETURN IMMEDIATELY TO THE EMERGENCEY DEPARTMENT.      EXTRA AVAILABLE RESOURCES:    1. DOCTOR REFERRALS  a. Call  our Physician Referral Line @ [phone] If you need  further assistance with referrals to primary care or specialty services our Case Manager may assist at [phone].  2. FREE HEALTH SERVICES  a. www.freemedicalsearch.org  b. http://www.[facility].org  May be utilized if you need help with health or social services, please call 2-1-1 for a free referral to resources in your area. 2-1-1 is a free  service connecting people with information on health insurance, free clinics, pregnancy, mental health, dental care, food assistance, housing, and substance abuse counseling.  3. MEDICAL RECORDS AND TESTS  Certain laboratory test results do not come back the same day, for example: urine cultures may take 3 days. We will attempt to contact you if other important findings are noted. Some lab testing may take 2-5 days. Radiology films are reviewed again to ensure accuracy. If there is any discrepancy, we will notify you. If you have questions or concerns, our Case Manager is available Monday thru Friday, 7:30a-3:00p, for follow up or test result questions. Please call [phone].  4. ED PATIENT [employer] SERVICES: [phone]. Contact for general questions and concerns, daily 11:00a-11:00p.      DISCHARGE MESSAGE:     YOU ARE THE MOST IMPORTANT FACTOR IN YOUR RECOVERY. Follow   the above instructions carefully. Take your medicines as prescribed. Most   important, see your  doctor in follow-up  as recommended by your ED physician.   Call your doctor if your condition worsens or if you have any new, worsening, or   severe symptoms. If you need further care and cannot be seen by your    recommended follow-up doctor, the Emergency Department Case Manager will   be available to help as needed [phone].  If you require immediate    assistance, return to the Emergency Department or call 911.    Pharmacy information  For your convenience please visit [facility] for your prescription needs. [facility] has extended evening and weekend hours, including holidays, to ensure patients can begin taking medications as soon as possible.  Most insurance plans are accepted.    Pharmacy Hours:  Monday - Friday: 8:30a to 8:30p  Saturday: 9a to 1p  Sunday: 9a to 1p and 6p to 9p  Holidays: 9a to 1p    Pharmacy Phone: [phone]      [facility] has been providing home care solutions for independent living since  1984. Servicing [place]. [facility] is a full service home medical provider of home oxygen and respiratory care, medical equipment and supplies. [phone].    Thanks Again, for allowing [facility]   Emergency Department to serve you.

## 2013-08-26 NOTE — ED Provider Notes (Signed)
Physician/Midlevel provider first contact with patient: 08/26/13 [facility]         [facility]  EMERGENCY DEPARTMENT  History and Physical Exam       Patient Name: [name] [name]  Encounter Date:  08/26/2013  Attending Physician: Marcellus Scott, MD  Treating Provider: Rachel Bo, PA  PCP: Bobby Rumpf, MD  Patient DOB:  April 17, 1961  MRN:  [id number]  Room:  E54/E54-A      History of Presenting Illness     Chief complaint: Dental Pain    HPI/ROS is limited by: none  HPI/ROS given by: patient    Location: left lower 1st bicuspid  Duration: worse for 1 day  Severity: severe    [name] [name] is a 52 y.o. male who presents with worsening left lower dental pain and swelling for the past day.  He states he has had pain since his previous ED visit in October.  He did f/u with [facility], but he does not have appointment to get tooth pulled until December.  States he was given refill on Pen VK and has been taking it 4 times daily since 08/01/13.  Denies fever or chills.  No dysphagia, trismus, pharyngitis. C/o left ear pain.         Review of Systems     Review of Systems   Constitutional: Negative for fever and chills.   HENT: Positive for dental problem, ear pain and facial swelling. Negative for drooling, rhinorrhea, sore throat, tinnitus, trouble swallowing and voice change.    Respiratory: Negative for cough and shortness of breath.    Cardiovascular: Negative for chest pain and palpitations.   Gastrointestinal: Negative for nausea, vomiting and abdominal pain.   Musculoskeletal: Negative for arthralgias, myalgias and neck pain.   Skin: Negative for color change and rash.   Neurological: Negative for weakness, numbness and headaches.   Hematological: Negative for adenopathy.          Allergies & Medications     Pt  has no known allergies.    Current/Home Medications    No medications on file         Past Medical, Surgical, Family and Social History     Pt has a past medical history of Arthritis  and Muscle pain.    Pt has past surgical history that includes Hernia repair; TRACHEOSTOMY; Leg Surgery; and Fracture surgery.    The family history is not on file.    Pt reports that he has been smoking.  He does not have any smokeless tobacco history on file. He reports that he does not drink alcohol or use illicit drugs.    In addition to the above history, please see nursing notes.  Allergies, meds, past medical, family and social hx have been REVIEWED BY MYSELF.        Physical Exam     Blood pressure 130/83, pulse 94, temperature 99.1 F (37.3 C), temperature source Oral, resp. rate 18, height 1.626 m, weight 53.5 kg, SpO2 100.00%.    Physical Exam   Nursing note and vitals reviewed.  Constitutional: He is oriented to person, place, and time. He appears well-developed and well-nourished. He is cooperative. He does not appear ill. He appears distressed.        Pt appears to be in moderate discomfort.     HENT:   Head: Normocephalic and atraumatic.   Right Ear: Hearing, tympanic membrane, external ear and ear canal normal.   Left Ear: Hearing, tympanic membrane, external ear  and ear canal normal.   Nose: Nose normal.   Mouth/Throat: Uvula is midline, oropharynx is clear and moist and mucous membranes are normal.            Mild left mandibular swelling without erythema.     Eyes: Conjunctivae normal are normal. Right eye exhibits no discharge. Left eye exhibits no discharge.   Neck: Normal range of motion. Neck supple.   Cardiovascular: Normal rate, regular rhythm, normal heart sounds and intact distal pulses.  Exam reveals no gallop and no friction rub.    No murmur heard.  Pulmonary/Chest: Effort normal and breath sounds normal. No respiratory distress. He has no decreased breath sounds. He has no wheezes. He has no rhonchi. He has no rales. He exhibits no tenderness.   Musculoskeletal: Normal range of motion.   Neurological: He is alert and oriented to person, place, and time. Coordination normal.   Skin:  Skin is warm and dry. No rash noted.   Psychiatric: He has a normal mood and affect.            Diagnostic Results     The results of the diagnostic studies below have been reviewed by myself:    Labs  Results     ** No Results found for the last 24 hours. **          Radiologic Studies  No results found.    EKG: none       Medical Decision Making     Blood pressure 130/83, pulse 94, temperature 99.1 F (37.3 C), temperature source Oral, resp. rate 18, height 1.626 m, weight 53.5 kg, SpO2 100.00%.      Orders Placed This Encounter   Procedures   . Saline lock IV       The results of the diagnostic studies, performed during the timeframe I've seen and evaluated the patient, have been REVIEWED BY MYSELF.    History and exam suggest pulpitis or possibly periapical abscess. There was no evidence of deep space infection such as trismus, dysphagia, submandibular, sublingual, or pharyngeal swelling, neck pain, or fever. A dental block was offered and was accepted by the patient. The patient was advised to follow up with a dentist as soon as possible, to take antibiotics as directed, and to return for new or worsening symptoms, especially symptoms consistent with abscess development or deep space infection such as trismus, dysphagia, submandibular, sublingual, or pharyngeal swelling, neck pain, or fever. Pt understands plan and follow up instructions. Questions were invited and answered.     All questions have been answered. Pt is appreciative of care.      Discussed patient with Marcellus Scott, MD and patient was not directly evaluated by Marcellus Scott, MD.  Marcellus Scott, MD agrees with assessment and plan.         Procedures / Critical Care     NERVE BLOCK   By Rachel Bo, PA  7:10 PM    Procedure: inferior alveolar nerve block  Location: left inferior alveolar nerve  Indication: severe pain  Ultrasound: no    Verbal consent.  Patient identified and location of block verified.  Appropriate sterile precautions  observed with handwashing and gloves.  Marcaine 0.75% given with 27-ga needle.  Aspiration prior to injection and negative for blood.  Patient tolerated procedure well with no apparent complications.       Diagnosis / Disposition     Clinical Impression  1. Dental abscess  Disposition  ED Disposition     Discharge [name] [name] discharge to home/self care.    Condition at disposition: Stable              Follow up for Discharged Patients  [facility]  [address]  [place] [address]    Call in 3 days  Call Monday for appointment as soon as possible    [facility] Emergency Department  [address]  [place] [address]  [phone]    As needed if symptoms worsen      Prescriptions for Discharged Patients  New Prescriptions    CLINDAMYCIN (CLEOCIN) 300 MG CAPSULE    Take 1 capsule (300 mg total) by mouth 3 (three) times daily.          This is note has been created by an Electronic Medical Record that may contain additions or subtractions not intended by myself, Albertine Patricia, PA-C.      Rachel Bo, [place]  08/26/13 1911

## 2014-02-22 ENCOUNTER — Observation Stay: Payer: Self-pay | Admitting: Internal Medicine

## 2014-02-22 ENCOUNTER — Emergency Department: Payer: Self-pay

## 2014-02-22 ENCOUNTER — Encounter: Payer: Self-pay | Admitting: Internal Medicine

## 2014-02-22 ENCOUNTER — Observation Stay
Admission: EM | Admit: 2014-02-22 | Discharge: 2014-02-23 | Disposition: A | Discharge status: Home / Self Care | Payer: Self-pay | Attending: Internal Medicine | Admitting: Internal Medicine

## 2014-02-22 DIAGNOSIS — F17200 Nicotine dependence, unspecified, uncomplicated: Secondary | ICD-10-CM

## 2014-02-22 DIAGNOSIS — R0789 Other chest pain: Principal | ICD-10-CM | POA: Insufficient documentation

## 2014-02-22 DIAGNOSIS — Z833 Family history of diabetes mellitus: Secondary | ICD-10-CM | POA: Insufficient documentation

## 2014-02-22 DIAGNOSIS — R079 Chest pain, unspecified: Secondary | ICD-10-CM | POA: Diagnosis present

## 2014-02-22 HISTORY — DX: Nicotine dependence, unspecified, uncomplicated: F17.200

## 2014-02-22 LAB — ECG 12-LEAD
P Wave Axis: 66 deg
P Wave Axis: 70 deg
P Wave Duration: 108 ms
P Wave Duration: 104 ms
P-R Interval: 136 ms
P-R Interval: 132 ms
Patient Age: 53 years
Patient Age: 53 years
Q-T Dispersion: 18 ms
Q-T Dispersion: 64 ms
Q-T Interval(Corrected): 387 ms
Q-T Interval(Corrected): 425 ms
Q-T Interval: 378 ms
Q-T Interval: 366 ms
QRS Axis: 32 deg
QRS Axis: 42 deg
QRS Duration: 98 ms
QRS Duration: 88 ms
T Axis: 55 deg
T Axis: 57 deg
Ventricular Rate: 63 /min
Ventricular Rate: 81 /min

## 2014-02-22 LAB — CBC AND DIFFERENTIAL
Basophils %: 0.9 % (ref 0.0–3.0)
Basophils Absolute: 0 10*3/uL (ref 0.0–0.3)
Eosinophils %: 4.2 % (ref 0.0–7.0)
Eosinophils Absolute: 0.2 10*3/uL (ref 0.0–0.8)
Hematocrit: 40.2 % (ref 39.0–52.5)
Hemoglobin: 13.9 gm/dL (ref 13.0–17.5)
Lymphocytes Absolute: 1.7 10*3/uL (ref 0.6–5.1)
Lymphocytes: 35.3 % (ref 15.0–46.0)
MCH: 28 pg (ref 28–35)
MCHC: 35 gm/dL (ref 32–36)
MCV: 79 fL — ABNORMAL LOW (ref 80–100)
MPV: 9 fL (ref 6.0–10.0)
Monocytes Absolute: 0.5 10*3/uL (ref 0.1–1.7)
Monocytes: 10.7 % (ref 3.0–15.0)
Neutrophils %: 48.9 % (ref 42.0–78.0)
Neutrophils Absolute: 2.3 10*3/uL (ref 1.7–8.6)
PLT CT: 194 10*3/uL (ref 130–440)
RBC: 5.08 10*6/uL (ref 4.00–5.70)
RDW: 12.4 % (ref 11.0–14.0)
WBC: 4.8 10*3/uL (ref 4.0–11.0)

## 2014-02-22 LAB — PT AND APTT
PT INR: 1 (ref 0.5–1.3)
PT: 10.8 s (ref 9.5–11.5)
aPTT: 27.2 s (ref 24.0–34.0)

## 2014-02-22 LAB — I-STAT CHEM 8 CARTRIDGE
Anion Gap I-Stat: 19 — ABNORMAL HIGH (ref 7–16)
BUN I-Stat: 13 mg/dL (ref 6–20)
Calcium Ionized I-Stat: 4.7 mg/dL (ref 4.35–5.10)
Chloride I-Stat: 105 mMol/L (ref 98–112)
Creatinine I-Stat: 0.7 mg/dL — ABNORMAL LOW (ref 0.90–1.30)
EGFR: >60 mL/min/{1.73_m2}
Glucose I-Stat: 79 mg/dL (ref 70–99)
Hematocrit I-Stat: 39 % (ref 39.0–52.5)
Hemoglobin I-Stat: 13.3 gm/dL (ref 13.0–17.5)
Potassium I-Stat: 3.5 mMol/L (ref 3.5–5.3)
Sodium I-Stat: 140 mMol/L (ref 135–145)
TCO2 I-Stat: 21 mMol/L — ABNORMAL LOW (ref 24–29)

## 2014-02-22 LAB — VH I-STAT CHEM 8 NOTIFICATION

## 2014-02-22 LAB — VH CARDIAC PROF.WITH TROPONIN
Creatine Kinase (CK): 208 U/L (ref 30–230)
Creatinine Kinase MB (CKMB): 2.9 ng/mL (ref 0.1–6.0)
Troponin I: 0.01 ng/mL (ref 0.00–0.02)

## 2014-02-22 LAB — VH I-STAT TROPONIN NOTIFICATION

## 2014-02-22 LAB — I-STAT TROPONIN: Troponin I I-Stat: <0.02 ng/mL (ref 0.00–0.02)

## 2014-02-22 MED ORDER — ASPIRIN 81 MG PO CHEW
CHEWABLE_TABLET | ORAL | Status: AC
Start: 2014-02-22 — End: ?
  Filled 2014-02-22: qty 4

## 2014-02-22 MED ORDER — ASPIRIN 81 MG PO CHEW
324.0000 mg | CHEWABLE_TABLET | Freq: Once | ORAL | Status: AC
Start: 2014-02-22 — End: 2014-02-22
  Administered 2014-02-22: 324 mg via ORAL

## 2014-02-22 MED ORDER — NITROGLYCERIN 0.4 MG SL SUBL
SUBLINGUAL_TABLET | SUBLINGUAL | Status: AC
Start: 2014-02-22 — End: ?
  Filled 2014-02-22: qty 25

## 2014-02-22 MED ORDER — SODIUM CHLORIDE 0.9 % IV BOLUS
1000.0000 mL | Freq: Once | INTRAVENOUS | Status: AC
Start: 2014-02-22 — End: 2014-02-22
  Administered 2014-02-22: 1000 mL via INTRAVENOUS

## 2014-02-22 MED ORDER — NITROGLYCERIN 0.4 MG SL SUBL
0.4000 mg | SUBLINGUAL_TABLET | SUBLINGUAL | Status: AC
Start: 2014-02-22 — End: 2014-02-22
  Administered 2014-02-22 (×3): 0.4 mg via SUBLINGUAL

## 2014-02-22 NOTE — ED Notes (Signed)
Pain 1/10

## 2014-02-22 NOTE — ED Provider Notes (Signed)
[facility] EMERGENCY DEPARTMENT History and Physical Exam      Patient Name: [name] [name],[name] [name]  Encounter Date:  02/22/2014  Attending Physician: Theodora Blow, MD  PCP: Bobby Rumpf, MD  Patient DOB:  04-14-1961  MRN:  [id number]  Room:  N3/N3-A      History of Presenting Illness     Chief complaint: Chest Pain    HPI/ROS is limited by: none  HPI/ROS given by: patient    Location: left chest  Duration: 0330 this morning  Severity: moderate    [name] [name] is a 53 y.o. male who presents with sudden onset of left sided chest discomfort this morning at 0330 while working, which has remained constant since that time. States that discomfort was severe enough upon its onset to cause him to stop working for a short period of time. Describes left chest discomfort as "warm" with "prickling needles" with radiation throughout left upper extremity. No neck pain or back pain. Associated shortness of breath, which has since resolved. No nausea or vomiting. Currently rates his chest discomfort as a 7/10, explaining that his symptoms improved somewhat after taking Aspirin 81 mg at home earlier today. Last cardiac stress test was completed more than 3 years ago.       Review of Systems     Review of Systems   Constitutional: Negative for fever and chills.   HENT: Negative for voice change.    Eyes: Negative for photophobia and discharge.   Respiratory: Positive for shortness of breath. Negative for stridor.    Cardiovascular: Positive for chest pain. Negative for palpitations.   Gastrointestinal: Negative for nausea, vomiting and diarrhea.   Genitourinary: Negative for dysuria and difficulty urinating.   Musculoskeletal: Negative for joint swelling, gait problem and neck stiffness.   Skin: Negative for rash and wound.   Neurological: Negative for seizures and syncope.   Hematological: Negative for adenopathy. Does not bruise/bleed easily.   Psychiatric/Behavioral: Negative for hallucinations and agitation.       Allergies     Pt has No  Known Allergies.    Medications     No current outpatient prescriptions on file.     Past Medical History     Pt has a past medical history of Tobacco dependence.    Past Surgical History     Pt has no past surgical history on file.    Family History     The family history is not on file.    Social History     Pt has no tobacco, alcohol, and drug history on file.    Physical Exam     Blood pressure 101/71, pulse 74, temperature 97.7 F (36.5 C), resp. rate 23, height 1.626 m, weight 50.4 kg, SpO2 100 %.    Physical Exam   Constitutional: He is oriented to person, place, and time. He appears well-developed and well-nourished. No distress.   HENT:   Head: Normocephalic and atraumatic.   Mouth/Throat: Oropharynx is clear and moist.   Eyes: Conjunctivae and EOM are normal. Pupils are equal, round, and reactive to light. Right eye exhibits no discharge. Left eye exhibits no discharge. No scleral icterus.   Neck: Normal range of motion. Neck supple. No JVD present.   Cardiovascular: Normal rate, regular rhythm, normal heart sounds and intact distal pulses.    No murmur heard.  Pulmonary/Chest: Effort normal. He has rales (diffuse rales and rhonchi, which clear with coughing).   Abdominal: Soft. Bowel sounds are normal. He exhibits no distension. There is  no tenderness.   Musculoskeletal: Normal range of motion. He exhibits no edema or tenderness.   Lymphadenopathy:     He has no cervical adenopathy.   Neurological: He is alert and oriented to person, place, and time. No cranial nerve deficit.   Skin: Skin is warm and dry. No rash noted.   Psychiatric: He has a normal mood and affect.   Nursing note and vitals reviewed.       Orders Placed     Orders Placed This Encounter   Procedures   . XR Chest 2 Views   . I-Stat Chem 8   . Cardiac Profile with Troponin   . CBC and differential   . I-Stat Chem 8 Notification   . I-Stat Troponin Notification   . Coag - PT/APTT   . i-Stat Chem 8 CartrIDge   . i-Stat Troponin   . ECG 12  lead   . ECG 12 lead   . Saline lock IV #1   . [facility] ED Bed Request       Diagnostic Results       The results of the diagnostic studies below have been reviewed by myself:    Labs  Results    Procedure Component Value Units Date/Time    Cardiac Profile with Troponin [[id number]] Collected:  02/22/14 2043    Specimen Information:  Plasma Updated:  02/22/14 2206     Creatinine Kinase MB (CKMB) 2.9 ng/mL      Creatine Kinase (CK) 208 U/L      Troponin I 0.01 ng/mL      CKMB Index NI %     Coag - PT/APTT [[id number]] Collected:  02/22/14 2043    Specimen Information:  Blood Updated:  02/22/14 2107     PT 10.8 sec      PT INR 1.0      aPTT 27.2 sec     CBC and differential [[id number]]  (Abnormal) Collected:  02/22/14 2043    Specimen Information:  Blood / Blood Updated:  02/22/14 2103     WBC 4.8 K/cmm      RBC 5.08 M/cmm      Hemoglobin 13.9 gm/dL      Hematocrit 03.4 %      MCV 79 (L) fL      MCH 28 pg      MCHC 35 gm/dL      RDW 74.2 %      PLT CT 194 K/cmm      MPV 9.0 fL      NEUTROPHIL % 48.9 %      Lymphocytes 35.3 %      Monocytes 10.7 %      Eosinophils % 4.2 %      Basophils % 0.9 %      Neutrophils Absolute 2.3 K/cmm      Lymphocytes Absolute 1.7 K/cmm      Monocytes Absolute 0.5 K/cmm      Eosinophils Absolute 0.2 K/cmm      BASO Absolute 0.0 K/cmm     i-Stat Troponin [[id number]] Collected:  02/22/14 2046    Specimen Information:  Blood Updated:  02/22/14 2058     Trop I, ISTAT <0.02 ng/mL     i-Stat Chem 8 CartrIDge [[id number]]  (Abnormal) Collected:  02/22/14 2047    Specimen Information:  Blood Updated:  02/22/14 2051     i-STAT Sodium 140 mMol/L      i-STAT Potassium 3.5 mMol/L      i-STAT Chloride 105  mMol/L      TCO2, ISTAT 21 (L) mMol/L      Ionized Ca, ISTAT 4.70 mg/dL      i-STAT Glucose 79 mg/dL      i-STAT Creatinine 0.70 (L) mg/dL      i-STAT BUN 13 mg/dL      Anion Gap, ISTAT 01.0 (H)      EGFR >60 mL/min/1.14m2      i-STAT Hematocrit 39.0 %      i-STAT Hemoglobin 13.3 gm/dL     I-Stat Chem 8  Notification [[id number]] Collected:  02/22/14 2045    Specimen Information:  ISTAT Updated:  02/22/14 2050     I-STAT Notification Istat Notification     I-Stat Troponin Notification [[id number]] Collected:  02/22/14 2045    Specimen Information:  ISTAT Updated:  02/22/14 2050     I-STAT Notification Istat Notification     I-Stat Chem 8 [[id number]] Collected:  02/22/14 2030    Specimen Information:  ISTAT Updated:  02/22/14 2045     I-STAT Notification Istat Notification           Radiologic Studies  Radiology Results (24 Hour)    Procedure Component Value Units Date/Time    XR Chest 2 Views [[id number]] Collected:  02/22/14 2135    Order Status:  Completed  Updated:  02/22/14 2138    Narrative:      Clinical History:  Chest pain    Examination:  Frontal and lateral views of the chest.    Comparison:  None available.    Findings:  The cardiomediastinal silhouette is within normal limits. A small nodular density projecting over the right base  probably represents a nipple shadow. The lungs appear to be clear. No acute skeletal abnormalities are seen.      Impression:      1.  No acute cardiopulmonary disease.  2.  Probable nipple shadow versus pulmonary nodule: A repeat chest x-ray with nipple markers would be helpful for  verification.    ReadingStation:[id number]          EKG: HR 63. NSR. J point elevation. No acute changes.     Repeat EKG shows:  HR 81. NSR. J point elevation. No acute changes.    ED Course & Treatment     2226: [facility] paged.    2238: Discussed case with [facility], Dr. Barnet Pall, for admission.     MDM / Critical Care     Blood pressure 101/71, pulse 74, temperature 97.7 F (36.5 C), resp. rate 23, height 1.626 m, weight 50.4 kg, SpO2 100 %.    The patient presents with chest pain and appears to be having an acute myocardial infarction.  The differential diagnosis included but was not limited to AAA, MI, dissection, and aneurysm.  Appropriate activation/consultation of the CODE system for this patient was  undertaken either in the field or ER.  Once the EKG was reviewed, the initial evaluation, treatment and stabilization for this patient was started in the Emergency Department and appropriate medications were given.  Contraindications for blood thinners was investigated. The patient was seen by the Cardiologist and taken to the cath lab/ICU for further treatment, management, and stabilization.  The admission plan was discussed with the patient and/or family and they will comply.  The initial/preliminary results of lab/radiology/EKG tests were discussed with the patient and/or family. Questions were answered and concerns addressed.        This chart was generated by an EMR and may contain errors or omissions not  intended by the user.    Procedures     None    Diagnosis / Disposition     Clinical Impression  1. Chest pain        Disposition  ED Disposition    Admit Bed Type: Telemetry [5]  Admitting Physician: Ester Rink [[id number]]  Patient Class: Inpatient [101]            Prescriptions  New Prescriptions    No medications on file       The documentation recorded by my scribe, Kerry Fort, accurately reflects the services I personally performed and the decisions made by me. Theodora Blow MD.                  Theodora Blow, MD  02/23/14 [id number]

## 2014-02-22 NOTE — ED Notes (Signed)
Pain 6/10. Dr Barnet Pall in room with pt.

## 2014-02-22 NOTE — ED Notes (Signed)
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 Pain has been constant. Mild SOB

## 2014-02-22 NOTE — ED Notes (Signed)
Dr Roseanna Rainbow made aware that SBP was 101 after 1st nitro. Ordered to hang NS bolus and give 2nd nitro

## 2014-02-23 ENCOUNTER — Observation Stay: Payer: Self-pay

## 2014-02-23 LAB — VH CARDIAC PROF.WITH TROPONIN
Creatine Kinase (CK): 158 U/L (ref 30–230)
Creatine Kinase (CK): 151 U/L (ref 30–230)
Creatinine Kinase MB (CKMB): 2.1 ng/mL (ref 0.1–6.0)
Creatinine Kinase MB (CKMB): 1.9 ng/mL (ref 0.1–6.0)
Troponin I: 0 ng/mL (ref 0.00–0.02)
Troponin I: 0 ng/mL (ref 0.00–0.02)

## 2014-02-23 LAB — ECG 12-LEAD
P Wave Axis: 73 deg
P Wave Duration: 104 ms
P-R Interval: 142 ms
Patient Age: 53 years
Q-T Interval(Corrected): 367 ms
Q-T Interval: 380 ms
QRS Axis: 55 deg
QRS Duration: 90 ms
T Axis: 65 deg
Ventricular Rate: 56 /min

## 2014-02-23 LAB — LIPID PANEL
Cholesterol: 131 mg/dL (ref 75–199)
Coronary Heart Disease Risk: 3.54
HDL: 37 mg/dL — ABNORMAL LOW (ref 40–55)
LDL Calculated: 77 mg/dL
Triglycerides: 84 mg/dL (ref 10–150)
VLDL: 17 (ref 0–40)

## 2014-02-23 MED ORDER — ACETAMINOPHEN 325 MG PO TABS
650.0000 mg | ORAL_TABLET | Freq: Four times a day (QID) | ORAL | Status: DC | PRN
Start: 2014-02-23 — End: 2014-02-23

## 2014-02-23 MED ORDER — HEPARIN SODIUM (PORCINE) PF 5000 UNIT/0.5ML IJ SOLN
5000.0000 [IU] | Freq: Three times a day (TID) | INTRAMUSCULAR | Status: DC
Start: 2014-02-23 — End: 2014-02-23
  Administered 2014-02-23: 5000 [IU] via SUBCUTANEOUS
  Filled 2014-02-23 (×5): qty 0.5

## 2014-02-23 MED ORDER — ASPIRIN 81 MG PO CHEW
81.0000 mg | CHEWABLE_TABLET | Freq: Every day | ORAL | Status: DC
Start: 2014-02-23 — End: 2014-02-23
  Administered 2014-02-23: 81 mg via ORAL
  Filled 2014-02-23: qty 1

## 2014-02-23 MED ORDER — NITROGLYCERIN 0.4 MG SL SUBL
0.4000 mg | SUBLINGUAL_TABLET | SUBLINGUAL | Status: DC | PRN
Start: 2014-02-23 — End: 2014-02-23

## 2014-02-23 MED ORDER — NICOTINE 21 MG/24HR TD PT24
1.0000 | MEDICATED_PATCH | Freq: Every day | TRANSDERMAL | Status: DC
Start: 2014-02-23 — End: 2014-09-11

## 2014-02-23 MED ORDER — SODIUM CHLORIDE 0.9 % IJ SOLN
3.0000 mL | Freq: Three times a day (TID) | INTRAMUSCULAR | Status: DC
Start: 2014-02-23 — End: 2014-02-23
  Administered 2014-02-23 (×2): 3 mL via INTRAVENOUS

## 2014-02-23 MED ORDER — NITROGLYCERIN 2 % TD OINT
0.5000 [in_us] | TOPICAL_OINTMENT | TRANSDERMAL | Status: DC
Start: 2014-02-23 — End: 2014-02-23
  Filled 2014-02-23 (×4): qty 1

## 2014-02-23 MED ORDER — MORPHINE SULFATE 2 MG/ML IJ/IV SOLN (WRAP)
2.0000 mg | Status: DC | PRN
Start: 2014-02-23 — End: 2014-02-23

## 2014-02-23 NOTE — Discharge Summary (Signed)
DISCHARGE SUMMARY - [facility]    Patient Name: [name] [name],[name] [name]  Attending Physician: Erasmo Leventhal, MD  Primary Care Physician: Bobby Rumpf, MD    Date of Admission: 02/22/2014  Date of Discharge: 02/23/2014  Length of Stay in the Hospital: 1    Discharge Diagnoses:                                                                         Atypical chest pain     Discharge Medications:                                                                         There are no discharge medications for this patient.       Consultations:                                                                                       none    Labs:                                                                                         Recent Labs  Lab 02/22/14  2043   WBC 4.8   RBC 5.08   Hemoglobin 13.9   Hematocrit 40.2   MCV 79*   PLT CT 194         Recent Labs  Lab 02/22/14  2043   PT INR 1.0       Imaging studies:                                                                                       Xr Chest 2 Views    02/22/2014   1.  No acute cardiopulmonary disease. 2.  Probable nipple shadow versus pulmonary nodule: A repeat chest x-ray with nipple markers would be helpful for verification.  ReadingStation:[id number]      Culture results:  Microbiology Results    None          Hospital Course:                                                                                    For full details of the patient's admission please consult the whole medical record including daily progress notes, history and physical, consult notes, lab reports as well as imaging studies.  Briefly Patient was admitted with chest pain and was ruled out for ACS with serial cardiac enzymes. Telemetry did not show any significant arrhythmias. CXR did not show any acute cardio-pulmonary process. Cardiac stress test was done and was reported low risk by  cardiology. Patient's symptoms improved clinically and patient was then discharged home in stable condition.     Discharge Day Exam:  Temp:  [97.4 F (36.3 C)-98 F (36.7 C)] 97.5 F (36.4 C)  Heart Rate:  [56-82] 59  Resp Rate:  [12-23] 17  BP: (96-123)/(55-85) 96/55 mmHg    General: awake, alert, oriented x 3; no acute distress.  Cardiovascular: regular rate and rhythm,S1 and S2 normal.  Respiratory: clear to auscultation bilaterally, no wheezing  Abdomen: soft, Non-tender, non-distended, audible bowel sounds  Extremities: no clubbing, cyanosis, or edema  Neuro: CN II-XII intact, no gross focal deficit    Discharge condition: stable    Discharge instructions:                                                                             Nutrition: Regular diet    Activity: As tolerated     Follow up:  Follow-up Information    Follow up with [facility].    Specialty:  Internal Medicine    Why:  As needed    Contact information:    [address]  [place] [address]  [phone]            Time spent coordinating discharge and reviewing discharge plan: 33 minutes    Signed by:   Deniece Ree Corley Maffeo  02/23/2014  9:34 AM    [facility]    CC: Bobby Rumpf, MD

## 2014-02-23 NOTE — Progress Notes (Signed)
Pt arrived back to rm after stress test. Pt given menu after MD Goyal said okay to feed him. Pt a&o x4 sm chest discomfort. Will cont to monitor.

## 2014-02-23 NOTE — Progress Notes (Signed)
Received pt from ED via w/c in stable condition. Assisted to bed and positioned for comfort. VSS. Denies CP/SOB. Oriented to room, callbell and bed controls. CBIR, Will continue to monitor.

## 2014-02-23 NOTE — H&P (Signed)
02/22/14    PRIMARY CARE PROVIDER: None.    CHIEF COMPLAINT: Chest pain.    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  No particular  aggravating factors, relieved by aspirin and nitroglycerin  sublingually he received in the ER and associated with shortness  of breath and diaphoresis.  The patient states that he had an  exercise stress test done in 2000 at [facility] for chest  pain and he thinks it was normal because there was no other test  done after that.  He has been chest pain-free since then till  now.  He denies any trauma to his chest wall neither has he done  any physical activity involving his upper extremities.  He was  hemodynamically stable in the ER.  Cardiac enzymes are within  normal limits.  A 12-lead EKG did not show any acute ischemic  changes.  Chest pain was mildly relieved by sublingual  nitroglycerin, and [facility] was consulted for  admission and upon review, he will be admitted for cardiac  testing in the morning.    PAST MEDICAL HISTORY: As stated in history of presenting  illness.    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    ALLERGIES: No known drug allergies.    CURRENT OUTPATIENT MEDICATIONS: Reviewed and none.    ER administered medications,    1.  Aspirin 324 mg orally x1.  2.  Sublingual nitroglycerin x1.  3.  One liter normal saline infusion.    FAMILY HISTORY: Reviewed and positive for type 2 diabetes  mellitus in his brother.    SOCIAL HISTORY: The patient currently  smokes one pack of  cigarettes per day.  Has done that since age 51.  Does not drink  alcohol nor use illicit drugs.  He is a FULL CODE with no  advance directive to that effect.  Designated healthcare proxy  is listed as his significant partner, Lurlean Nanny.    REVIEW OF SYSTEMS: Except as stated in the history of presenting  illness, 10-point system reviewed and negative.    PHYSICAL EXAMINATION: VITAL SIGNS:  Blood pressure 122/85, pulse  rate 82, respiratory rate 20, temperature 97.7 degrees  Fahrenheit taken orally, and saturating 100% on room air.  GENERAL:  Younger than stated age, looking African-American  male, lying in bed, not in acute cardiopulmonary, no painful  distress.  HEENT:  Head is normocephalic, atraumatic.  Eyes, pink  conjunctivae, anicteric sclerae.  ENT, moist mucous membranes.  Oropharynx without erythema, exudate, swelling.  NECK:  Supple with range of motion without any jugular venous  distention noted.  No cervical masses palpable.  CARDIOVASCULAR:  S1 and S2 of normal intensity and regular  rhythm without any murmurs, rubs, or gallops appreciated.  No  tenderness to palpation of chest wall.  RESPIRATORY:  Lungs are clear to auscultation bilaterally  without any adventitious sounds heard.  ABDOMEN:  Soft, without tenderness, rebound tenderness,  guarding, or rigidity in any of the quadrants.  No  costovertebral angle tenderness noted.  No organomegaly  appreciated.  Normoactive bowel sounds present.  EXTREMITIES:  2+ pulses without cyanosis, clubbing, or edema.  MUSCULOSKELETAL:  No joint deformities, tenderness, swelling  noted with full range of motion in all the joints examined.  SKIN:  Warm and dry to touch without any lesions seen.  LYMPHATICS:  No lymphadenopathy noted.  NEUROLOGIC:  The patient is awake and alert.  Oriented to time,  place, person, situation.  No gross focal neurological deficit  noted.  PSYCHIATRIC:  Mood and affect normal.    LABORATORY AND DIAGNOSTIC  REVIEW: A 12-lead EKG reviewed by me,  normal sinus rhythm, no acute ischemic changes noted.  Troponin  I 0.01.  Creatine kinase 208.  CK-MB 2.9.  INR of 1.  I-STAT  electrolytes, glucose 79, sodium 140, potassium 3.5, chloride  105, bicarbonate 21, BUN of 13, creatinine 0.7.  WBC 4.8,  hemoglobin 13.9, hematocrit 40.2, platelet count 194.  Chest  x-ray reviewed by me, official report, no acute cardiopulmonary  disease noted.    ASSESSMENT AND PLAN: A 53 year old, African-American male with  medical history as described, presents with chest pain, admitted  to [facility] to be managed as follows:    1.  Chest pain.  The patient has risk factors of gender,      age, and cigarette smoking.  Initial cardiac workup with      respect to cardiac enzymes and 12-lead EKG negative.  The      patient still has mild chest pain.  Would place on      telemetry monitoring class II.  Morphine for chest pain.      Repeat 12-lead EKG, still without any ischemic changes.      Serialize cardiac enzymes and 12-lead EKG.  Oxygen support as      needed.  Nitroglycerin topical and sublingual, aspirin, and      subcutaneous heparin therapy.  Check lipid panel.  Schedule      for cardiac stress testing in the morning and further workup      to depend on results of the stress test.  2.  Tobacco dependence via cigarette smoking.  The patient      counseled on cessation for 3 to 5 minutes.  Will withhold      nicotine replacement therapy because of ongoing chest pain.  3.  Deep venous thrombosis prophylaxis with subcutaneous      heparin.  4.  Peptic ulcer disease prophylaxis.  Not indicated.  5.  Plan of care discussed with the patient and is in      agreement as outlined.  6.  Patient is a FULL CODE.  7.  Patient is seen at 22:40 hours on 02/22/2014.        I hereby certify this patient for hospitalization based upon   medical necessity as noted above.    [id number]  DD: 02/22/2014 23:01:40  DT: 02/23/2014 00:03:44  JOB:  [id number]

## 2014-02-23 NOTE — Discharge Instructions (Signed)
Chest Pain, Uncertain Cause  Chest pain can happen for a number of reasons. Sometimes the cause can not be determined. If yourcondition does not seem serious, and your pain does not appear to be coming from your heart, your doctor may recommend watching it closely. Sometimes the signs of a serious problem take more time to appear. Therefore, watch for the warning signs listed below.  Home care  After your visit, follow these recommendations:   Rest today and avoid strenuous activity.   Take any prescribed medicine as directed.  Follow-up care  Follow up with your doctor or this facility as instructed or if you do not start to feel better within 24 hours.  Call 911  Get immediate medical attention if any of the following occur:   A change in the type of pain: if it feels different, becomes more severe, lasts longer, or begins to spread into your shoulder, arm, neck, jaw or back   Shortness of breath or increased pain with breathing   Weakness, dizziness, or fainting   Rapid heart beat  Get prompt medical ttention  Call your doctor right away if any of the following occur:   Cough with dark colored sputum (phlegm) or blood   Fever of 100.4F(38C) or higher, or as directed by your health care provider   Swelling, pain or redness in one leg   2000-2014 [employer], [address], [place] [address]. All rights reserved. This information is not intended as a substitute for professional medical care. Always follow your healthcare professional's instructions.

## 2014-02-23 NOTE — Progress Notes (Signed)
[facility]   [address]   [place] [address]     INITIAL ASSESSMENT  Case Management / Social Work         Estimated D/C Date: 5/21      PCP/Last visit: [facility]      Home assessment/PLOF: Lives at home with his wife. Pt is independent and works      Recruitment consultant:  No Veterinary surgeon: [facility]       DME's/Supplier: none    Inpatient Plan of Care: Pt was admitted with chest pain. Work up and is now for discharge      CM Interventions: CM met with pt. He states he is likely to afford meds at discharge.       Transportation:family      Barriers to discharge:  none      D/C Plan and Needs:  discharge today to home     Ronette Deter, Charity fundraiser, BSN [facility]  Case Manager  [facility]  Ph [phone]  Fx [phone]

## 2014-02-23 NOTE — Progress Notes (Signed)
MD called for nicotine patch script. Waiting for script to [place] pt.

## 2014-02-23 NOTE — Progress Notes (Signed)
Exercise Stress Test (treadmill only): 7:44min; 88%THR; 8.4 METS; no chest pain; no ST changes; no images pending.    Stanford Scotland, MS

## 2014-02-23 NOTE — UM Notes (Signed)
[facility] Utilization Management Review Sheet    NAME: [name] [name]  MR#: [id number]    CSN#: [id number]    ROOM: 319/319-A AGE: 53 y.o.    ADMIT DATE AND TIME: 02/22/2014  8:32 PM      PATIENT CLASS:    ATTENDING PHYSICIAN: No att. providers found  PAYOR:Payor: /       AUTH #:     DIAGNOSIS:  Chest pain      HISTORY:   Past Medical History   Diagnosis Date   . 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 No particular  aggravating factors, relieved by aspirin and nitroglycerin  sublingually he received in the ER and associated with shortness  of breath and diaphoresis      Admitted to tele serial enzymes which were negative and a stress test was neg  Patient d/c home as obsv  ,

## 2014-02-23 NOTE — Progress Notes (Signed)
Discharge instructions completed with pt, wife and pt have questions. MD Goyal to speak with them. Pt declined a wheel chair and is going to walk out with wife.

## 2014-02-23 NOTE — Plan of Care (Signed)
Problem: Pain  Goal: Patient's pain/discomfort is manageable  Outcome: Progressing

## 2014-03-02 ENCOUNTER — Encounter: Payer: Self-pay | Admitting: Internal Medicine

## 2014-03-02 ENCOUNTER — Ambulatory Visit: Payer: Self-pay | Attending: Family | Admitting: Family

## 2014-03-02 ENCOUNTER — Encounter: Payer: Self-pay | Admitting: Family

## 2014-03-02 VITALS — BP 122/69 | HR 77 | Temp 97.9°F | Resp 20 | Ht 64.0 in | Wt 117.0 lb

## 2014-03-02 DIAGNOSIS — Z720 Tobacco use: Secondary | ICD-10-CM

## 2014-03-02 DIAGNOSIS — Q682 Congenital deformity of knee: Secondary | ICD-10-CM | POA: Insufficient documentation

## 2014-03-02 DIAGNOSIS — Z09 Encounter for follow-up examination after completed treatment for conditions other than malignant neoplasm: Secondary | ICD-10-CM

## 2014-03-02 DIAGNOSIS — R0789 Other chest pain: Secondary | ICD-10-CM | POA: Insufficient documentation

## 2014-03-02 DIAGNOSIS — F17200 Nicotine dependence, unspecified, uncomplicated: Secondary | ICD-10-CM | POA: Insufficient documentation

## 2014-03-02 NOTE — Progress Notes (Signed)
After her recent hospitalization 20th 2015 to Feb 23, 2014 for evaluation of chest pain [name] [name]  is scheduled for follow up care in the [facility] and to help establish care with a PCP.  Chart reviewed to help determine transition to appropriate provider.  Patient lacks PCP and does not have insurance or   Rx coverage. It appears that he was previously seen at the [facility] in 2013  The [facility] financial assistance paper work is incomplete ay need to notify the financial counselor when he arrives for his appointment Discussed w/ the FNP  Plan is for transition to the [facility]) if still eligible   [name] [name] presented for his appointment and he brought with him as FIS and proof of income and reports that he has an appointment at the free medical clinic. The nurse practitioner called and verified that he has an appointment on June 8 at 4 PM. He also reports that his Medicare will be effective in 2016. Copies of his FIS proof of income was sent to the business office the interoffice mail. No additional follow-up is needed in the transition clinic at this time  Chronic Care Management Progress Note     03/02/2014  12:03 PM  Care Team Member: [name] Lombard Zakee Deerman, RN  Patient Active Problem List    Diagnosis Date Noted   . 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 Tobacco dependence 02/22/2014

## 2014-03-02 NOTE — Patient Instructions (Signed)
You need to secure an appointment with a Primary Care Provider (PCP) for your health care follow-up, routine health screenings, and possibly refills on prescriptions.  If you need assistance with this, our Nurse Navigator, Pension scheme manager, RN can assist you.  You can call the Transition Clinic at [phone].       You are currently a patient at the [facility].  They are your primary care provider.  Your next appointment at the [facility] is March 13, 2014 at 4:00pm.  Please arrive 15 minutes early for this appointment and do not miss this appointment.  There is a 10 dollar fee if you don't show for this appointment.  The phone number to the [facility] is :[phone].    Please follow your hospital discharge instructions.    You can call the Transition Clinic if you have questions:  The phone number is:  [phone].    Please review the following:      Insomnia  Insomnia refers to a difficulty going to sleep or staying asleep, or both. Insomnia has many causes, including anxiety, stress, depression, chronic pain, sleeping cycles out of balance due to working night shifts or excess napping during the day, and a condition called "sleep apnea". Insomnia can be a side effect from stimulant medicines such as decongestants, asthma inhalers and pills, diet pills, and illegal drugs such as speed, crank, crack, and PCP.  Home Care:  1. Review your medicines with your doctor or pharmacist to find out if they can cause insomnia.  2. Caffeine, smoking and alcohol also affect sleep. Limit your daily use and do not use these before bedtime. Alcohol may make you sleepy at first, but as its effects wear off, you may awaken a few hours later and have trouble returning to sleep.  3. Do not exercise, eat or drink large amounts of liquid within 2 hours of your bedtime.  4. Improve your sleep habits. Have a fixed bed and wake-up time. Try to keep noise, light and heat in your bedroom at a comfortable  level. Try using earplugs or eyeshades if needed.  5. Avoid watching TV in bed.  6. If you do not fall asleep within 30 minutes, try to relax by reading or listening to soft music.  7. Limit daytime napping to one 30 minute period, early in the day.  8. Get regular exercise. Find other ways to lessen your stress level.  9. If a medicine was prescribed to help reset your sleep patterns, take it as directed. Sleeping pills are intended for short-term use, only. If taken for too long, the effect wears off while the risk of physical addiction and psychological dependence increases.  Follow-Up  with your doctor or as directed by our staff if you feel that your insomnia is not responding to the above measures.  Get Prompt Medical Attention  if any of the following occur:   Extreme restlessness or irritability   Confusion or hallucinations (seeing or hearing things that are not there)   Anxiety, depression   Several days without sleeping   [address] [place], [place] [address]. All rights reserved. This information is not intended as a substitute for professional medical care. Always follow your healthcare professional's instructions.

## 2014-03-02 NOTE — Progress Notes (Signed)
Subjective:    Patient ID: [name] [name] is a 53 y.o. male. He presents to the transition clinic today for assistance with finding a primary care provider. He was recently hospitalized from Feb 22, 2014 through Feb 23, 2014 with chest pain. His cardiac enzymes were negative.  His CV stress test on 02-23-14 showed:  INTERPRETATION: Baseline EKG demonstrates sinus rhythm with normal axis and intervals.   The patient exercised according to a Bruce protocol, achieving an estimated workload of 8.4 METS and 88% maximum age predicted heart rate after 7 minutes. Resting heart rate was 79 beats per   minute increased to 148 beats per minute. Resting blood pressure is 94/60, increased to 160/60.   The patient noted no chest discomfort. There are no ischemic ST-segment changes. No arrhythmias are noted. IMPRESSION: Clinically and electrographically normal exercise stress test at a moderate workload and adequate heart rate.     Since his hospital discharge, he denies any dizziness, falls, chest pain, SOB, or peripheral edema.  He reports if he drinks too much caffeine, he has palpitations; not associated with chest pain or SOB.  He has decreased his caffeine intake to 1 cup of coffee a day, and no longer has the palpitations.    He reports he smokes 1/2 PPD of cigarettes. Reports he quit drinking alcohol two years ago. Denies illicit drug use.      Reports [facility] and thinks that he has an upcoming appointment there.    HPI    Review of Systems   Constitutional: Negative for fever, chills and diaphoresis.        Denies any bleeding.    HENT: Negative for trouble swallowing.    Eyes: Negative for visual disturbance.   Respiratory: Negative for cough, chest tightness, shortness of breath and wheezing.    Cardiovascular: Negative for chest pain and leg swelling.   Gastrointestinal: Negative for nausea, vomiting, abdominal pain, diarrhea, constipation and blood in stool.   Endocrine: Negative  for polyuria.   Genitourinary: Negative for dysuria and hematuria.   Neurological: Negative for dizziness, syncope and light-headedness.        Denies any falls.          Objective:    BP 122/69   Pulse 77   Temp(Src) 97.9 F (36.6 C) (Oral)   Resp 20   Ht 1.626 m (5\' 4" )   Wt 53.071 kg (117 lb)   BMI 20.07 kg/m2     SpO2 100%    On 09-03-12, A1c: 5.5  On 02-23-14: cholesterol: 131; triglycerides: 84; HDL: 37 and LDL:  77    Physical Exam   Constitutional: He is oriented to person, place, and time. He appears well-developed and well-nourished. No distress.   HENT:   Head: Normocephalic and atraumatic.   Neck:   No carotid bruits heard with auscultation.    Cardiovascular: Normal rate, regular rhythm, normal heart sounds and intact distal pulses.    Pulmonary/Chest: Effort normal. No respiratory distress. He has wheezes. He has no rales.   Abdominal: Soft. Bowel sounds are normal. There is no guarding.   Musculoskeletal: Normal range of motion.   Ambulates with a limp; reports congenital knee deformed with surgical repair as a child.   Neurological: He is alert and oriented to person, place, and time.   Skin: Skin is warm and dry. He is not diaphoretic.   Psychiatric: He has a normal mood and affect. His behavior is  normal. Judgment and thought content normal.         Assessment:       1. Tobacco use    2. Hospital discharge follow-up    3. Chest pain, atypical          Plan:       Met with patient.  Reviewed what a primary care provider is, and the rationale for having a PCP for consistent health care follow up, coordination of health care services, lab work, routine health screenings and prescription refills. He verbalizes understanding.  [facility] the [facility] and confirmed that patient is currently a patient there, eligible for services there and has an upcoming appointment scheduled for March 13, 2014 at 4pm.  I reviewed this with the patient; instructed him to arrive 15 minutes early for  his appointment, and to review his recent hospitalization with the [facility].  Instructed on ill effects of nicotine and need for smoking cessation.  Encouraged to limit caffeine intake.  Reviewed signs to seek medical attention. Printed out his AVS; reviewed this with him and provided him with a copy.  Ensured that he has phone number to Transition [facility], in case questions arise.  He verbalizes understanding of the plan of care.     Procedures

## 2014-04-03 ENCOUNTER — Emergency Department: Payer: Self-pay

## 2014-04-03 ENCOUNTER — Emergency Department
Admission: EM | Admit: 2014-04-03 | Discharge: 2014-04-03 | Disposition: A | Discharge status: Home / Self Care | Payer: Self-pay | Attending: Emergency Medicine | Admitting: Emergency Medicine

## 2014-04-03 DIAGNOSIS — S5000XA Contusion of unspecified elbow, initial encounter: Secondary | ICD-10-CM | POA: Insufficient documentation

## 2014-04-03 DIAGNOSIS — S5001XA Contusion of right elbow, initial encounter: Secondary | ICD-10-CM

## 2014-04-03 DIAGNOSIS — F17200 Nicotine dependence, unspecified, uncomplicated: Secondary | ICD-10-CM | POA: Insufficient documentation

## 2014-04-03 DIAGNOSIS — S9031XA Contusion of right foot, initial encounter: Secondary | ICD-10-CM

## 2014-04-03 DIAGNOSIS — S9030XA Contusion of unspecified foot, initial encounter: Secondary | ICD-10-CM | POA: Insufficient documentation

## 2014-04-03 DIAGNOSIS — W19XXXA Unspecified fall, initial encounter: Secondary | ICD-10-CM | POA: Insufficient documentation

## 2014-04-03 MED ORDER — IBUPROFEN 400 MG PO TABS
400.0000 mg | ORAL_TABLET | Freq: Four times a day (QID) | ORAL | Status: DC | PRN
Start: 2014-04-03 — End: 2014-09-11

## 2014-04-03 NOTE — Discharge Instructions (Signed)
Contusion, Elbow  A contusion of your elbow causes local pain, swelling and sometimes bruising. There are no broken bones. This injury takes a few days to a few weeks to heal. A sling may be provided for comfort and arm support  Home Care  :  1. Keep your arm elevated to reduce pain and swelling.This is most important during the first 48 hours after injury.  2. Apply an ice pack (ice cubes in a plastic bag, wrapped in a towel) over the injured area for 20 minutes every 1-2 hours the first day. You should continue with ice packs 3-4 times a day for the next two days. Continue the use of ice packs for relief of pain and swelling as needed.  3. You may use acetaminophen (Tylenol) or ibuprofen (Motrin, Advil) to control pain, unless another pain medicine was prescribed. [NOTE: If you have chronic liver or kidney disease or ever had a stomach ulcer or GI bleeding, talk with your doctor before using these medicines.]  4. If a sling was provided, you may remove it to shower or bathe. Do not wear it for more than one week or it may cause joint stiffness.  Follow Up  with your doctor or as advised by our staff if you are not starting to improve within the nextthree days.  Get Prompt Medical Attention  if any of the following occur:   Pain or swelling increases   Redness, warmth or drainage   Hand or fingers becomes cold, blue, numb or tingly   [address], [address] [place], [place] [address]. All rights reserved. This information is not intended as a substitute for professional medical care. Always follow your healthcare professional's instructions.        Contusion: Foot  You have a CONTUSION of your foot. This causes local pain, swelling and sometimes bruising. There are no broken bones. This injury may take from a few days to a few weeks to heal.  Home Care:  1) Keep your LEG elevated to reduce pain and swelling. This is very important during the first 48 hours. If walking causes pain, stay off  the injured leg until you can walk without pain.  2) If CRUTCHES have been advised, do not bear full weight on the injured leg until you can do so without pain. You may return to sports when you are able to hop and run on the injured leg without pain.  3) Make an ice pack (ice cubes in a plastic bag, wrapped in a towel) and apply for 20 minutes every 1-2 hours the first day. Continue this 3-4 times a day until the swelling goes down.  4) You may use acetaminophen (Tylenol) or ibuprofen (Motrin, Advil) to control pain, unless another pain medicine was prescribed. [ NOTE : If you have chronic liver or kidney disease or ever had a stomach ulcer or GI bleeding, talk with your doctor before using these medicines.]  Follow Up  with your doctor or this facility if you are not starting to improve within the next THREE days.  [NOTE: If X-rays were taken, they will be reviewed by a radiologist. You will be notified of any new findings that may affect your care.]  Get Prompt Medical Attention  if any of the following occur:  -- Pain or swelling increases  -- Toes become cold, blue, numb or tingly  -- Redness, warmth or drainage from the skin   [address] [place], [place]  [address]. All rights reserved. This information is not intended as a substitute for professional medical care. Always follow your healthcare professional's instructions.    Thank you for choosing [facility] for your emergency care needs. We strive to provide EXCELLENT care to you and your family.      YOUR ACCURATE CONTACT INFORMATION IS VERY IMPORTANT    Before leaving please check with registration to make sure we have an up-to-date contact number. A Toll-free post discharge customer service number is available to update your registration/insurance information as well as answer any billing questions or concerns. That number is [phone].       IF YOU DO NOT CONTINUE TO IMPROVE OR YOUR CONDITION WORSENS,  PLEASE CONTACT YOUR DOCTOR OR RETURN IMMEDIATELY TO THE EMERGENCEY DEPARTMENT.      EXTRA AVAILABLE RESOURCES:    1. DOCTOR REFERRALS  a. Call  our Physician Referral Line @ [phone] If you need  further assistance with referrals to primary care or specialty services our Case Manager may assist at [phone].  2. FREE HEALTH SERVICES  a. www.freemedicalsearch.org  b. http://www.[employer].org  May be utilized if you need help with health or social services, please call 2-1-1 for a free referral to resources in your area. 2-1-1 is a free service connecting people with information on health insurance, free clinics, pregnancy, mental health, dental care, food assistance, housing, and substance abuse counseling.  3. MEDICAL RECORDS AND TESTS  Certain laboratory test results do not come back the same day, for example: urine cultures may take 3 days. We will attempt to contact you if other important findings are noted. Some lab testing may take 2-5 days. Radiology films are reviewed again to ensure accuracy. If there is any discrepancy, we will notify you. If you have questions or concerns, our Case Manager is available Monday thru Friday, 7:30a-3:00p, for follow up or test result questions. Please call [phone].  4. ED PATIENT [employer] SERVICES: [phone]. Contact for general questions and concerns, daily 11:00a-11:00p.      DISCHARGE MESSAGE:     YOU ARE THE MOST IMPORTANT FACTOR IN YOUR RECOVERY. Follow   the above instructions carefully. Take your medicines as prescribed. Most   important, see your  doctor in follow-up  as recommended by your ED physician.   Call your doctor if your condition worsens or if you have any new, worsening, or   severe symptoms. If you need further care and cannot be seen by your    recommended follow-up doctor, the Emergency Department Case Manager will   be available to help as needed [phone].  If you require immediate    assistance, return to the Emergency  Department or call 911.    Pharmacy information  For your convenience please visit [facility] for your prescription needs. [facility] has extended evening and weekend hours, including holidays, to ensure patients can begin taking medications as soon as possible.  Most insurance plans are accepted.    Pharmacy Hours:  Monday - Friday: 8:30a to 8:30p  Saturday: 9a to 1p  Sunday: 9a to 1p and 6p to 9p  Holidays: 9a to 1p    Pharmacy Phone: [phone]      [facility] has been providing home care solutions for independent living since 1984. Servicing [place]. [facility] is a full service home medical provider of home oxygen and respiratory care, medical equipment and supplies. [phone].  Thanks Again, for allowing [facility]   Emergency Department to serve you.

## 2014-04-03 NOTE — ED Notes (Signed)
This nurse reviewed discharge instructions with pt verbally and visually.  Pt verbalized understanding of discharge instructions, questions encouraged. Copy of discharge instructions with prescriptions handed to patient by this nurse.

## 2014-04-03 NOTE — ED Provider Notes (Signed)
Physician/Midlevel provider first contact with patient: 04/03/14 1400         [facility]  EMERGENCY DEPARTMENT  History and Physical Exam       Patient Name: [name] [name],[name] [name]  Encounter Date:  04/03/2014  Attending Physician: Ermalene Postin, MD  Treating Provider: Rachel Bo, PA  PCP: Bobby Rumpf, MD (General)  Patient DOB:  1960/12/23  MRN:  [id number]  Room:  E51/E51-A      History of Presenting Illness     Chief complaint: Fall    HPI/ROS is limited by: none  HPI/ROS given by: patient    Location: right elbow and right foot  Duration: 2 days ago  Severity: moderate    [name] [name] is a 53 y.o. male who presents with right elbow pain and pain in his right lateral foot and 5th toe since a fall in his home 2 days ago.  States he was coming up the wooden stairs in his basement after doing laundry when the stair broke causing him to fall onto concrete.  Denies head contusion, LOC, neck or back pain.  States he tried to go to work today, but that he had pain with repetitive movements of his right elbow.  Denies numbness or weakness.       Review of Systems     Review of Systems   Constitutional: Negative for fever and chills.   HENT: Negative for rhinorrhea and sore throat.    Respiratory: Negative for cough and shortness of breath.    Cardiovascular: Negative for chest pain and leg swelling.   Gastrointestinal: Negative for nausea, vomiting and abdominal pain.   Musculoskeletal: Positive for arthralgias. Negative for myalgias, back pain, joint swelling, gait problem and neck pain.   Skin: Negative for color change and wound.   Neurological: Negative for weakness, numbness and headaches.        Allergies & Medications     Pt is allergic to tramadol.    Current/Home Medications    NICOTINE (NICODERM CQ) 21 MG/24HR    Place 1 patch onto the skin daily.         Past Medical, Surgical, Family and Social History     Pt has a past medical history of Arthritis; Muscle pain; and Tobacco dependence.    Pt  has past surgical history that includes TRACHEOSTOMY; Leg Surgery; Fracture surgery; Hernia repair; and Fracture surgery.    The family history includes Diabetes in his brother.    Pt reports that he has been smoking.  He does not have any smokeless tobacco history on file. He reports that he does not drink alcohol or use illicit drugs.    In addition to the above history, please see nursing notes.  Allergies, meds, past medical, family and social hx have been REVIEWED BY MYSELF.        Physical Exam     Blood pressure 113/80, pulse 89, temperature 98.1 F (36.7 C), temperature source Oral, resp. rate 14, weight 51.3 kg, SpO2 100 %.    Physical Exam   Constitutional: He is oriented to person, place, and time. He appears well-developed and well-nourished. No distress.   HENT:   Head: Normocephalic and atraumatic.   Neck: Normal range of motion. Neck supple.   Cardiovascular: Normal rate, regular rhythm, normal heart sounds and intact distal pulses.  Exam reveals no gallop and no friction rub.    No murmur heard.  Pulses:       Radial pulses are 2+ on  the right side, and 2+ on the left side.        Dorsalis pedis pulses are 2+ on the right side, and 2+ on the left side.   Pulmonary/Chest: Effort normal and breath sounds normal. No respiratory distress. He has no decreased breath sounds. He has no wheezes. He has no rhonchi. He has no rales. He exhibits no tenderness.   Musculoskeletal: Normal range of motion. He exhibits tenderness.        Right shoulder: Normal.        Right elbow: He exhibits normal range of motion, no swelling, no effusion, no deformity and no laceration. Tenderness found. Lateral epicondyle and olecranon process tenderness noted. No radial head and no medial epicondyle tenderness noted.        Right wrist: Normal.        Right hand: Normal.        Left hand: Normal.        Right foot: There is tenderness and bony tenderness. There is normal range of motion, no swelling, normal capillary refill,  no crepitus, no deformity and no laceration.        Left foot: Normal.        Feet:    Neurological: He is alert and oriented to person, place, and time. He has normal strength. He exhibits normal muscle tone. Coordination and gait normal.   Skin: Skin is warm, dry and intact. No abrasion, no bruising, no ecchymosis and no rash noted.   Psychiatric: He has a normal mood and affect.   Nursing note and vitals reviewed.           Diagnostic Results     The results of the diagnostic studies below have been reviewed by myself:    Labs  Results    ** No results found for the last 24 hours. **          Radiologic Studies  Xr Elbow Right Ap Lateral And Obliques    04/03/2014   Mild soft tissue thickening at the olecranon. Chronic triceps enthesopathy. Correlation for superimposed avulsion injury or contusion suggested.  ReadingStation:[email or web address]    Xr Foot Right Ap Lateral And Oblique    04/03/2014   No observed fracture or dislocation of the right foot.  ReadingStation:[id number]      EKG: none       Medical Decision Making     Blood pressure 113/80, pulse 89, temperature 98.1 F (36.7 C), temperature source Oral, resp. rate 14, weight 51.3 kg, SpO2 100 %.      Orders Placed This Encounter   Procedures   . XR FOOT RIGHT AP LATERAL AND OBLIQUE   . XR ELBOW RIGHT AP LATERAL AND OBLIQUES       The results of the diagnostic studies, performed during the timeframe I've seen and evaluated the patient, have been REVIEWED BY MYSELF.    This patient presents to the Emergency Department following a fall or traumatic event.   Evaluation, examination and treatment for this patient was performed and revealed that there were no serious injuries identified in the ED.  In addition this patient seems to be very low risk for any delayed serious injury. Many sequelae of their event were considered in the differential diagnosis including fracture, closed head injury, contusion, abrasion, laceration and organ injury. Any serious sequelae that would  require admission were thought unlikely. The patient was felt to be stable for discharge home.  The diagnostic impression and plan and appropriate follow-up were  discussed and agreed upon with the patient and/or family.  If performed the results of lab/radiology tests were reviewed and discussed with the patient and/or family. All questions were answered and concerns addressed.  The patient was warned to return immediately for worsening symptoms or any acute concerns.    Labs / imaging were reviewed and explained to the patient. All questions have been answered. Pt is appreciative of care. Patient understand risks and benefits of the various choices available.    Discussed patient with Ermalene Postin, MD and patient was not directly evaluated by Ermalene Postin, MD.  Ermalene Postin, MD agrees with assessment and plan.         Procedures / Critical Care     none     Diagnosis / Disposition     Clinical Impression  1. Elbow contusion, right, initial encounter    2. Foot contusion, right, initial encounter    3. Fall, initial encounter        Disposition  ED Disposition    Discharge [name] [name] discharge to home/self care.    Condition at disposition: Stable              Follow up for Discharged Patients  Glo Herring, DO  [address]  [place] [address]  [phone]      As needed, If symptoms worsen    Pcp, [facility], MD          [facility] Emergency Department  [address]  [place] [address]  [phone]    As needed, If symptoms worsen      Prescriptions for Discharged Patients  New Prescriptions    IBUPROFEN (ADVIL,MOTRIN) 400 MG TABLET    Take 1 tablet (400 mg total) by mouth every 6 (six) hours as needed for Pain.          This is note has been created by an Electronic Medical Record that may contain additions or subtractions not intended by myself, Albertine Patricia, PA-C.      Rachel Bo, [place]  04/03/14 1628    Ermalene Postin, MD  04/03/14 [phone]

## 2014-05-11 ENCOUNTER — Emergency Department: Payer: Self-pay

## 2014-05-11 ENCOUNTER — Emergency Department
Admission: EM | Admit: 2014-05-11 | Discharge: 2014-05-11 | Disposition: A | Discharge status: Home / Self Care | Payer: Self-pay | Attending: General Practice | Admitting: General Practice

## 2014-05-11 DIAGNOSIS — M25511 Pain in right shoulder: Secondary | ICD-10-CM

## 2014-05-11 DIAGNOSIS — M25519 Pain in unspecified shoulder: Secondary | ICD-10-CM | POA: Insufficient documentation

## 2014-05-11 MED ORDER — HYDROCODONE-ACETAMINOPHEN 5-325 MG PO TABS
1.0000 | ORAL_TABLET | Freq: Four times a day (QID) | ORAL | Status: DC | PRN
Start: 2014-05-11 — End: 2014-09-11

## 2014-05-12 NOTE — ED Provider Notes (Addendum)
Physician/Midlevel provider first contact with patient: 05/11/14 1701         History     Chief Complaint   Patient presents with   . Shoulder Pain     HPI     53 year old male came to emergency room complaining of right shoulder pain. This is a chronic situation he has a muscle tear on the right side. Difficulty moving his right shoulder. Patient is taking Naprosyn and tramadol for pain.     Past Medical History   Diagnosis Date   . Arthritis    . Muscle pain      torn bicep    . Tobacco dependence        Past Surgical History   Procedure Laterality Date   . Tracheostomy     . Leg surgery       left leg surgery as child from CP   . Fracture surgery       hip surgery    . Hernia repair     . Fracture surgery         Family History   Problem Relation Age of Onset   . Diabetes Brother        Social  History   Substance Use Topics   . Smoking status: Current Every Day Smoker -- 1.00 packs/day   . Smokeless tobacco: Not on file   . Alcohol Use: No       .     Allergies   Allergen Reactions   . Naprosyn [Naproxen]    . Tramadol        Discharge Medication List as of 05/11/2014  6:14 PM      CONTINUE these medications which have NOT CHANGED    Details   ibuprofen (ADVIL,MOTRIN) 400 MG tablet Take 1 tablet (400 mg total) by mouth every 6 (six) hours as needed for Pain., Starting 04/03/2014, Until Discontinued, Print      nicotine (NICODERM CQ) 21 MG/24HR Place 1 patch onto the skin daily., Starting 02/23/2014, Until Discontinued, OTC              Review of Systems   All other systems reviewed and are negative.      Physical Exam    BP: 104/84 mmHg, Heart Rate: 84, Temp: 97.5 F (36.4 C), Resp Rate: 20, SpO2: 100 %    Physical Exam   Constitutional: He is oriented to person, place, and time. He appears well-developed and well-nourished. No distress.   HENT:   Head: Normocephalic and atraumatic.   Right Ear: External ear normal.   Left Ear: External ear normal.   Nose: Nose normal.   Mouth/Throat: Oropharynx is clear and  moist. No oropharyngeal exudate.   Neck: Normal range of motion. Neck supple. No JVD present. No tracheal deviation present. No thyromegaly present.   Cardiovascular: Normal rate, regular rhythm and normal heart sounds.    No murmur heard.  Pulmonary/Chest: Effort normal and breath sounds normal. No stridor. No respiratory distress. He has no wheezes. He has no rales. He exhibits no tenderness.   Abdominal: Soft. Bowel sounds are normal. He exhibits no distension and no mass. There is no tenderness. There is no rebound and no guarding.   Musculoskeletal: He exhibits tenderness.   Tender right shoulder deformity upper arm especially upper third. Muscle deformity.   Lymphadenopathy:     He has no cervical adenopathy.   Neurological: He is alert and oriented to person, place, and time.   Skin: Skin  is warm. No rash noted. He is not diaphoretic. No erythema. No pallor.   Nursing note and vitals reviewed.      MDM and ED Course     ED Medication Orders    None           MDM      Procedures    Clinical Impression & Disposition     Clinical Impression  Final diagnoses:   Right shoulder pain        ED Disposition    Discharge [name] discharge to home/self care.    Condition at disposition: Stable             Discharge Medication List as of 05/11/2014  6:14 PM      START taking these medications    Details   HYDROcodone-acetaminophen (NORCO) 5-325 MG per tablet Take 1 tablet by mouth every 6 (six) hours as needed for Pain., Starting 05/11/2014, Until Discontinued, Print                       Buddie Marston, Jean Rosenthal, MD  05/12/14 2020    Fredrik Rigger, MD  05/12/14 2020    Fredrik Rigger, MD  05/12/14 2024

## 2014-05-25 ENCOUNTER — Ambulatory Visit
Admission: RE | Admit: 2014-05-25 | Discharge: 2014-05-25 | Disposition: A | Discharge status: Home / Self Care | Payer: Self-pay | Source: Ambulatory Visit | Attending: Nurse Practitioner | Admitting: Nurse Practitioner

## 2014-05-25 ENCOUNTER — Other Ambulatory Visit: Payer: Self-pay | Admitting: Nurse Practitioner

## 2014-05-25 DIAGNOSIS — M25552 Pain in left hip: Secondary | ICD-10-CM

## 2014-05-25 LAB — LIPID PANEL
Cholesterol: 180 mg/dL (ref 75–199)
Coronary Heart Disease Risk: 3.46
HDL: 52 mg/dL (ref 40–55)
LDL Calculated: 106 mg/dL
Triglycerides: 110 mg/dL (ref 10–150)
VLDL: 22 (ref 0–40)

## 2014-05-25 LAB — URINALYSIS
Bilirubin, UA: NEGATIVE
Blood, UA: NEGATIVE
Glucose, UA: NEGATIVE mg/dL
Ketones UA: NEGATIVE mg/dL
Leukocyte Esterase, UA: NEGATIVE Leu/uL
Nitrite, UA: NEGATIVE
Protein, UR: NEGATIVE mg/dL
RBC, UA: 1 /hpf (ref 0–4)
Squam Epithel, UA: <1 /hpf (ref 0–2)
Urine Specific Gravity: 1.016 (ref 1.001–1.040)
Urobilinogen, UA: NORMAL mg/dL
WBC, UA: 2 /hpf (ref 0–4)
pH, Urine: 5 pH (ref 5.0–8.0)

## 2014-05-25 LAB — BASIC METABOLIC PANEL
Anion Gap: 13.8 mMol/L (ref 7.0–18.0)
BUN / Creatinine Ratio: 16.9 Ratio (ref 10.0–30.0)
BUN: 13 mg/dL (ref 7–22)
CO2: 26.6 mMol/L (ref 20.0–30.0)
Calcium: 9.5 mg/dL (ref 8.5–10.5)
Chloride: 102 mMol/L (ref 98–110)
Creatinine: 0.77 mg/dL — ABNORMAL LOW (ref 0.80–1.30)
EGFR: >60 mL/min/{1.73_m2}
Glucose: 53 mg/dL — ABNORMAL LOW (ref 70–99)
Osmolality Calc: 273 mOsm/kg — ABNORMAL LOW (ref 275–300)
Potassium: 4.4 mMol/L (ref 3.5–5.3)
Sodium: 138 mMol/L (ref 136–147)

## 2014-05-25 LAB — CBC AND DIFFERENTIAL
Basophils %: 1.2 % (ref 0.0–3.0)
Basophils Absolute: 0 10*3/uL (ref 0.0–0.3)
Eosinophils %: 5.4 % (ref 0.0–7.0)
Eosinophils Absolute: 0.2 10*3/uL (ref 0.0–0.8)
Hematocrit: 44 % (ref 39.0–52.5)
Hemoglobin: 14.9 gm/dL (ref 13.0–17.5)
Lymphocytes Absolute: 1.6 10*3/uL (ref 0.6–5.1)
Lymphocytes: 42.4 % (ref 15.0–46.0)
MCH: 27 pg — ABNORMAL LOW (ref 28–35)
MCHC: 34 gm/dL (ref 32–36)
MCV: 80 fL (ref 80–100)
MPV: 9.7 fL (ref 6.0–10.0)
Monocytes Absolute: 0.4 10*3/uL (ref 0.1–1.7)
Monocytes: 11 % (ref 3.0–15.0)
Neutrophils %: 40 % — ABNORMAL LOW (ref 42.0–78.0)
Neutrophils Absolute: 1.5 10*3/uL — ABNORMAL LOW (ref 1.7–8.6)
PLT CT: 157 10*3/uL (ref 130–440)
RBC: 5.51 10*6/uL (ref 4.00–5.70)
RDW: 13.4 % (ref 11.0–14.0)
WBC: 3.9 10*3/uL — ABNORMAL LOW (ref 4.0–11.0)

## 2014-05-25 LAB — THYROID STIMULATING HORMONE (TSH), REFLEX ON ABNORMAL TO FREE T4, SERUM: TSH: 1.18 u[IU]/mL (ref 0.40–4.20)

## 2014-05-25 LAB — VITAMIN D,25 OH,TOTAL: Vitamin D 25-Hydroxy: 27 ng/mL — ABNORMAL LOW (ref 30–80)

## 2014-07-13 ENCOUNTER — Other Ambulatory Visit: Payer: Self-pay | Admitting: Nurse Practitioner

## 2014-07-13 DIAGNOSIS — M48061 Spinal stenosis, lumbar region without neurogenic claudication: Secondary | ICD-10-CM

## 2014-07-15 ENCOUNTER — Ambulatory Visit
Admission: RE | Admit: 2014-07-15 | Discharge: 2014-07-15 | Disposition: A | Discharge status: Home / Self Care | Payer: Self-pay | Source: Ambulatory Visit | Attending: Nurse Practitioner | Admitting: Nurse Practitioner

## 2014-07-15 DIAGNOSIS — M48061 Spinal stenosis, lumbar region without neurogenic claudication: Secondary | ICD-10-CM

## 2014-07-15 DIAGNOSIS — M5134 Other intervertebral disc degeneration, thoracic region: Secondary | ICD-10-CM | POA: Insufficient documentation

## 2014-07-15 DIAGNOSIS — M5116 Intervertebral disc disorders with radiculopathy, lumbar region: Secondary | ICD-10-CM | POA: Insufficient documentation

## 2014-09-11 ENCOUNTER — Emergency Department: Payer: Self-pay

## 2014-09-11 ENCOUNTER — Emergency Department
Admission: EM | Admit: 2014-09-11 | Discharge: 2014-09-11 | Disposition: A | Discharge status: Home / Self Care | Payer: Self-pay | Attending: Emergency Medicine | Admitting: Emergency Medicine

## 2014-09-11 DIAGNOSIS — M25511 Pain in right shoulder: Secondary | ICD-10-CM | POA: Insufficient documentation

## 2014-09-11 DIAGNOSIS — S46111S Strain of muscle, fascia and tendon of long head of biceps, right arm, sequela: Secondary | ICD-10-CM

## 2014-09-11 DIAGNOSIS — G8929 Other chronic pain: Secondary | ICD-10-CM | POA: Insufficient documentation

## 2014-09-11 DIAGNOSIS — X58XXXS Exposure to other specified factors, sequela: Secondary | ICD-10-CM | POA: Insufficient documentation

## 2014-09-11 DIAGNOSIS — S46211S Strain of muscle, fascia and tendon of other parts of biceps, right arm, sequela: Secondary | ICD-10-CM

## 2014-09-11 HISTORY — DX: Pain in right shoulder: M25.511

## 2014-09-11 MED ORDER — HYDROCODONE-ACETAMINOPHEN 5-325 MG PO TABS
ORAL_TABLET | ORAL | Status: AC
Start: 2014-09-11 — End: ?
  Filled 2014-09-11: qty 1

## 2014-09-11 MED ORDER — HYDROCODONE-ACETAMINOPHEN 5-300 MG PO TABS
1.0000 | ORAL_TABLET | Freq: Four times a day (QID) | ORAL | Status: DC | PRN
Start: 2014-09-11 — End: 2014-09-21

## 2014-09-11 MED ORDER — HYDROCODONE-ACETAMINOPHEN 5-325 MG PO TABS
1.0000 | ORAL_TABLET | Freq: Once | ORAL | Status: AC
Start: 2014-09-11 — End: 2014-09-11
  Administered 2014-09-11: 1 via ORAL

## 2014-09-11 NOTE — ED Provider Notes (Signed)
Physician/Midlevel provider first contact with patient: 09/11/14 0052         History     Chief Complaint   Patient presents with   . Shoulder Pain     RIGHT SHOULDER PAIN X 2 YEARS -- PT STATES [facility]    pt asleep upon my entering the room.  Pt awakened, the pt reports pain in the right shoulder.  The pt reports a torn bicep tendon on the right shoulder.  The pt reports excruciating pain.  The pain became worse about 1030 pm.  He was a work Art therapist work).  The states his shoulder was injured in 2012, and was told he had a torn biceps tendon.            Past Medical History   Diagnosis Date   . Arthritis    . Muscle pain      torn bicep    . Tobacco dependence    . Chronic shoulder pain, right        Past Surgical History   Procedure Laterality Date   . Tracheostomy     . Leg surgery       left leg surgery as child from CP   . Fracture surgery       hip surgery    . Hernia repair     . Fracture surgery         Family History   Problem Relation Age of Onset   . Diabetes Brother        Social  History   Substance Use Topics   . Smoking status: Current Every Day Smoker -- 1.00 packs/day   . Smokeless tobacco: Not on file   . Alcohol Use: No       .     Allergies   Allergen Reactions   . Naprosyn [Naproxen]    . Tramadol        Current/Home Medications    No medications on file        Review of Systems   Musculoskeletal:        Chronic right shoulder pain   Neurological: Positive for weakness (right shoulder 2 years).       Physical Exam    BP: 126/68 mmHg, Heart Rate: 86, Temp: 97.9 F (36.6 C), Resp Rate: 18, SpO2: 99 %     Physical Exam   Constitutional: He is oriented to person, place, and time. He appears well-developed and well-nourished.   HENT:   Head: Normocephalic and atraumatic.   Musculoskeletal:   Cristobal Goldmann of proximal biceps tendon rupture which the patient tells me is many years old. Patient has abduction to about 90 unable to raise his arm above that. It is a  good radial pulse.   Neurological: He is alert and oriented to person, place, and time.   Skin: Skin is warm and dry.   Nursing note and vitals reviewed.        MDM and ED Course     ED Medication Orders     Start     Status Ordering Provider    09/11/14 0108  HYDROcodone-acetaminophen (NORCO) 5-325 MG per tablet 1 tablet   Once in ED     Route: Oral  Ordered Dose: 1 tablet     Ordered Brance Dartt, PRESTON              MDM       Procedures    Clinical Impression &  Disposition     Clinical Impression  Final diagnoses:   Chronic right shoulder pain   Biceps tendon rupture, proximal, right, sequela        ED Disposition     Discharge [name] discharge to home/self care.    Condition at disposition: Stable             New Prescriptions    HYDROCODONE-ACETAMINOPHEN 5-300 MG TAB    Take 1 tablet by mouth every 6 (six) hours as needed.                 Yevette Edwards, MD  09/11/14 [phone]

## 2014-09-11 NOTE — Discharge Instructions (Signed)
The Shoulder Joint    The shoulder is made up of bones, muscles, ligaments, and tendons. They work together so you can reach, swing, and lift in comfort. Learning about the parts of the shoulder and joint will help you to understand your shoulder problem.  The Parts of the Joint    The shoulder joint is where the humerus (upper arm bone) meets the scapula (shoulder blade).   Muscles and ligaments help make up the joint. They attach to the shoulder blade and upper arm bone.   At the top of the shoulder blade are two bony knobs called the acromion and coracoid process.   The subacromial space is between the top of the humerus and the acromion. This space is filled with tendons,muscles, and the subacromial bursa.   The bursa is a sac of fluid that cushions shoulder parts as they move.    The supraspinatus muscle and tendon are located in the subacromial space. They help form the rotator cuff and are commonly injured in a rotator cuff tear.       2000-2015 [employer] [address], [place] [address]. All rights reserved. This information is not intended as a substitute for professional medical care. Always follow your healthcare professional's instructions.

## 2014-09-15 ENCOUNTER — Emergency Department (HOSPITAL_BASED_OUTPATIENT_CLINIC_OR_DEPARTMENT_OTHER): Payer: Self-pay

## 2014-09-15 ENCOUNTER — Emergency Department (HOSPITAL_BASED_OUTPATIENT_CLINIC_OR_DEPARTMENT_OTHER)
Admission: EM | Admit: 2014-09-15 | Discharge: 2014-09-15 | Disposition: A | Discharge status: Home / Self Care | Payer: Self-pay | Attending: Emergency Medicine | Admitting: Emergency Medicine

## 2014-09-15 ENCOUNTER — Encounter (HOSPITAL_BASED_OUTPATIENT_CLINIC_OR_DEPARTMENT_OTHER): Payer: Self-pay

## 2014-09-15 DIAGNOSIS — S40011A Contusion of right shoulder, initial encounter: Secondary | ICD-10-CM | POA: Insufficient documentation

## 2014-09-15 DIAGNOSIS — M1612 Unilateral primary osteoarthritis, left hip: Secondary | ICD-10-CM | POA: Insufficient documentation

## 2014-09-15 DIAGNOSIS — M5137 Other intervertebral disc degeneration, lumbosacral region: Secondary | ICD-10-CM | POA: Insufficient documentation

## 2014-09-15 DIAGNOSIS — G809 Cerebral palsy, unspecified: Secondary | ICD-10-CM | POA: Insufficient documentation

## 2014-09-15 DIAGNOSIS — M5135 Other intervertebral disc degeneration, thoracolumbar region: Secondary | ICD-10-CM | POA: Insufficient documentation

## 2014-09-15 DIAGNOSIS — Y92411 Interstate highway as the place of occurrence of the external cause: Secondary | ICD-10-CM | POA: Insufficient documentation

## 2014-09-15 DIAGNOSIS — S39012A Strain of muscle, fascia and tendon of lower back, initial encounter: Secondary | ICD-10-CM | POA: Insufficient documentation

## 2014-09-15 DIAGNOSIS — S7002XA Contusion of left hip, initial encounter: Secondary | ICD-10-CM | POA: Insufficient documentation

## 2014-09-15 DIAGNOSIS — F17210 Nicotine dependence, cigarettes, uncomplicated: Secondary | ICD-10-CM | POA: Insufficient documentation

## 2014-09-15 HISTORY — DX: Cerebral palsy, unspecified (CMS HCC): G80.9

## 2014-09-15 MED ORDER — DICLOFENAC SODIUM 50 MG TABLET,DELAYED RELEASE
50.00 mg | DELAYED_RELEASE_TABLET | Freq: Two times a day (BID) | ORAL | Status: DC
Start: 2014-09-15 — End: 2014-11-07

## 2014-09-15 MED ORDER — CYCLOBENZAPRINE 10 MG TABLET
10.00 mg | ORAL_TABLET | Freq: Three times a day (TID) | ORAL | Status: DC
Start: 2014-09-15 — End: 2014-09-15

## 2014-09-15 MED ORDER — DIAZEPAM 10 MG TABLET
10.00 mg | ORAL_TABLET | Freq: Three times a day (TID) | ORAL | Status: DC | PRN
Start: 2014-09-15 — End: 2014-11-07
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
## 2014-09-15 NOTE — ED Nurses Note (Signed)
Patient discharged home with family.  AVS reviewed with patient/care giver.  A written copy of the AVS and discharge instructions was given to the patient/care giver.  Questions sufficiently answered as needed.  Patient/care giver encouraged to follow up with PCP as indicated.  In the event of an emergency, patient/care giver instructed to call 911 or go to the nearest emergency room.     Pt verbalized understanding of medication administration and of need for follow up with PCP. Denies questions. Pt ambulatory out of room without assistance.        Current Discharge Medication List      START taking these medications.       Details    diazepam 10 mg Tablet   Commonly known as:  VALIUM    10 mg, Oral, EVERY 8 HOURS PRN   Qty:  12 Tab   Refills:  0       diclofenac sodium 50 mg Tablet, Delayed Release (E.C.)   Commonly known as:  VOLTAREN    50 mg, Oral, 2 TIMES DAILY   Qty:  30 Tab   Refills:  0

## 2014-09-15 NOTE — ED Nurses Note (Signed)
Pt ambulatory to bathroom without assistance.
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
## 2014-09-21 ENCOUNTER — Emergency Department
Admission: EM | Admit: 2014-09-21 | Discharge: 2014-09-21 | Disposition: A | Discharge status: Home / Self Care | Payer: Self-pay | Attending: Emergency Medicine | Admitting: Emergency Medicine

## 2014-09-21 ENCOUNTER — Emergency Department: Payer: Self-pay

## 2014-09-21 DIAGNOSIS — G8929 Other chronic pain: Secondary | ICD-10-CM | POA: Insufficient documentation

## 2014-09-21 DIAGNOSIS — M549 Dorsalgia, unspecified: Secondary | ICD-10-CM

## 2014-09-21 DIAGNOSIS — Y9371 Activity, boxing: Secondary | ICD-10-CM | POA: Insufficient documentation

## 2014-09-21 DIAGNOSIS — M545 Low back pain: Secondary | ICD-10-CM | POA: Insufficient documentation

## 2014-09-21 DIAGNOSIS — X58XXXA Exposure to other specified factors, initial encounter: Secondary | ICD-10-CM | POA: Insufficient documentation

## 2014-09-21 MED ORDER — HYDROCODONE-ACETAMINOPHEN 5-325 MG PO TABS
1.0000 | ORAL_TABLET | Freq: Three times a day (TID) | ORAL | Status: DC | PRN
Start: 2014-09-21 — End: 2014-11-19

## 2014-09-21 MED ORDER — DIAZEPAM 5 MG PO TABS
5.0000 mg | ORAL_TABLET | Freq: Once | ORAL | Status: AC
Start: 2014-09-21 — End: 2014-09-21
  Administered 2014-09-21: 5 mg via ORAL

## 2014-09-21 MED ORDER — DIAZEPAM 5 MG PO TABS
ORAL_TABLET | ORAL | Status: AC
Start: 2014-09-21 — End: ?
  Filled 2014-09-21: qty 1

## 2014-09-21 MED ORDER — HYDROCODONE-ACETAMINOPHEN 5-325 MG PO TABS
1.0000 | ORAL_TABLET | Freq: Once | ORAL | Status: AC
Start: 2014-09-21 — End: 2014-09-21
  Administered 2014-09-21: 1 via ORAL

## 2014-09-21 MED ORDER — PREDNISONE 20 MG PO TABS
ORAL_TABLET | ORAL | Status: AC
Start: 2014-09-21 — End: ?
  Filled 2014-09-21: qty 3

## 2014-09-21 MED ORDER — DIAZEPAM 5 MG PO TABS
5.0000 mg | ORAL_TABLET | Freq: Two times a day (BID) | ORAL | Status: DC | PRN
Start: 2014-09-21 — End: 2014-11-19

## 2014-09-21 MED ORDER — PREDNISONE 20 MG PO TABS
ORAL_TABLET | ORAL | Status: DC
Start: 2014-09-21 — End: 2014-10-15

## 2014-09-21 MED ORDER — PREDNISONE 20 MG PO TABS
60.0000 mg | ORAL_TABLET | Freq: Once | ORAL | Status: AC
Start: 2014-09-21 — End: 2014-09-21
  Administered 2014-09-21: 60 mg via ORAL

## 2014-09-21 MED ORDER — HYDROCODONE-ACETAMINOPHEN 5-325 MG PO TABS
ORAL_TABLET | ORAL | Status: AC
Start: 2014-09-21 — End: ?
  Filled 2014-09-21: qty 1

## 2014-09-21 NOTE — Discharge Instructions (Signed)
Chronic Pain  Pain of recent onset ("acute pain") serves an important function. It lets you know something is wrong that needs your attention. When the body heals, acute pain goes away.  When pain lasts longer than six months, it is called "chronic pain." It may be present even after the body has healed. Chronic pain has both a physical and a psychological component. It may cause low self-esteem, depression and irritability. And, it can interfere with daily activities.  Treatment:  Chronic pain is treated with a combination of medicines, therapy and lifestyle changes.  Medicines may include pain relievers and antidepressants. It is best not to rely on regular use of narcotics for chronic pain. This leads to physical addiction. If narcotics are used at all, they are best limited to acute, breakthrough pain. Medicines used for seizures also help in certain types of chronic pain.  Physical Therapy can offer stretching and strengthening activities as well as low-impact exercise. This can reduce certain types of chronic pain.  Occupational Therapy teaches you how to do routine tasks of daily living in ways that minimize your discomfort.  Psychological Therapy can help you deal with the stress in your life so you feel more at ease.  Other Modalities such as meditation, yoga, biofeedback, massage and acupuncture can also help manage chronic pain.  Lifestyle Habits can affect chronic pain. The following should be part of any chronic pain treatment plan.  1. Eat healthy  2. Develop an exercise routine  3. Get enough sleep at night  4. Stop smoking and limit alcohol use  5. Start a weight loss program if you are overweight  Many patients can be free from chronic pain. But at the very least, you should expect your pain to become less severe, occur less often and interfere less with your daily life.  Follow Up  with your doctor or as advised by our staff. Let your doctor know if your current treatment plan is successful or if  changes are needed.  Resources:  American Council for Headache Society www.achenet.org  American Chronic Pain Association www.theacpa.org 800-533-3231   2000-2015 [employer] [address], [place] [address]. All rights reserved. This information is not intended as a substitute for professional medical care. Always follow your healthcare professional's instructions.          Back Pain [Acute Or Chronic]    Back pain is usually caused by an injury to the muscles or ligaments of the spine. Sometimes the disks that separate each bone in the spine may bulge and cause pain by pressing on a nearby nerve. Back pain may also appear after a sudden twisting/bending force (such as in a car accident), after a simple awkward movement, or lifting something heavy with poor body positioning. In either case, muscle spasm is often present and adds to the pain.  Acute back pain usually gets better in one to two weeks. Back pain related to disk disease, arthritis in the spinal joints or spinal stenosis (narrowing of the spinal canal) can become chronic and last for months or years.  Unless you had a physical injury (for example, a car accident or fall) X-rays are usually not ordered for the initial evaluation of back pain. If pain continues and does not respond to medical treatment, x-rays and other tests may be performed at a later time.  Home Care:  6. You may need to stay in bed the first few days. But, as soon as possible, begin sitting   or walking to avoid problems with prolonged bed rest (muscle weakness, worsening back stiffness and pain, blood clots in the legs).  7. When in bed, try to find a position of comfort. A firm mattress is best. Try lying flat on your back with pillows under your knees. You can also try lying on your side with your knees bent up towards your chest and a pillow between your knees.  8. Avoid prolonged sitting. This puts more stress on the lower back than standing or  walking.  9. During the first two days after injury, apply an ICE PACK to the painful area for 20 minutes every 2-4 hours. This will reduce swelling and pain. HEAT (hot shower, hot bath or heating pad) works well for muscle spasm. You can start with ice, then switch to heat after two days. Some patients feel best alternating ice and heat treatments. Use the one method that feels the best to you.  10. You may use acetaminophen (Tylenol) or ibuprofen (Motrin, Advil) to control pain, unless another pain medicine was prescribed. [NOTE: If you have chronic liver or kidney disease or ever had a stomach ulcer or GI bleeding, talk with your doctor before using these medicines.]  11. Be aware of safe lifting methods and do not lift anything over 15 pounds until all the pain is gone.  Follow Up  with your doctor or this facility if your symptoms do not start to improve after one week. Physical therapy may be needed.  [NOTE: If X-rays were taken, they will be reviewed by a radiologist. You will be notified of any new findings that may affect your care.]  Get Prompt Medical Attention  if any of the following occur:   Pain becomes worse or spreads to your legs   Weakness or numbness in one or both legs   Loss of bowel or bladder control   Numbness in the groin or genital area   2000-2015 [employer]. [address], [place] [address]. All rights reserved. This information is not intended as a substitute for professional medical care. Always follow your healthcare professional's instructions.

## 2014-09-21 NOTE — ED Provider Notes (Signed)
[facility] EMERGENCY DEPARTMENT History and Physical Exam      Patient Name: [name] [name],[name] [name]  Encounter Date:  09/21/2014  Attending Physician: Justice Britain, *  Nurse Practitioner: Rulon Sera, NP  PCP: Yevette Edwards, MD  Patient DOB:  May 01, 1961  MRN:  [id number]  Room:  E5/ED5-A      History of Presenting Illness     Chief complaint: Back Pain    HPI/ROS is limited by: none  HPI/ROS given by: patient    Location: mid to lower back pain  Duration: several years, but worse today after helping a friend move some boxes around  Severity: moderate  Quality: "spasms"  Timing: intermittent  Modifying Factors: pain worse with movement  Associated Symptoms: none      [name] [name] is a 53 y.o. male who presents with mid and lower back pain. He has a history of chronic back pain. He is trying to follow up with pain clinic. He is seen by [facility] in [place]. He states he was in the area helping a friend move some boxes and felt a "pull" when he was lifting a box. His pain in similar to pain in the past. He denies loss of bowel or bladder.     Review of Systems   Review of Systems   Constitutional: Negative for fever and chills.   Cardiovascular: Negative for chest pain.   Gastrointestinal: Negative for nausea, vomiting, abdominal pain and diarrhea.   Genitourinary: Negative for dysuria, urgency, frequency and flank pain.   Musculoskeletal: Positive for back pain. Negative for joint pain and falls.        Mid and lower back pain   Skin: Negative for rash.   Neurological: Negative for dizziness and headaches.          Allergies     Pt is allergic to naprosyn and tramadol.    Medications     Current Outpatient Rx   Name  Route  Sig  Dispense  Refill   . diazepam (VALIUM) 5 MG tablet    Oral    Take 1 tablet (5 mg total) by mouth every 12 (twelve) hours as needed.    10 tablet    0     . HYDROcodone-acetaminophen (NORCO) 5-325 MG per tablet    Oral    Take 1 tablet by mouth every 8 (eight) hours as needed for Pain.     12 tablet    0     . predniSONE (DELTASONE) 20 MG tablet        Take 50mg  (2.5 tabs) day 1, take 40mg  (2 tabs) day 2, take 30mg  (1.5 tabs) day 3, take 20mg  (1 tab) day 4, take 10mg  (1/2) day 5 & 6.    8 tablet    0     . DISCONTD: Hydrocodone-Acetaminophen 5-300 MG Tab    Oral    Take 1 tablet by mouth every 6 (six) hours as needed.    12 tablet    0          Past Medical History     Pt has a past medical history of Arthritis; Muscle pain; Tobacco dependence; and Chronic shoulder pain, right.    Past Surgical History     Pt has past surgical history that includes TRACHEOSTOMY; Leg Surgery; Fracture surgery; Hernia repair; and Fracture surgery.    Family History     The family history includes Diabetes in his brother.    Social History  Pt reports that he has been smoking.  He does not have any smokeless tobacco history on file. He reports that he does not drink alcohol or use illicit drugs.    Physical Exam     Blood pressure 135/84, pulse 70, temperature 97.8 F (36.6 C), temperature source Oral, resp. rate 18, height 1.626 m, weight 53.524 kg, SpO2 99 %.    Physical Exam   Constitutional: He is oriented to person, place, and time. He appears well-developed.   HENT:   Head: Normocephalic.   Cardiovascular: Normal rate, regular rhythm and normal heart sounds.    Pulmonary/Chest: Effort normal and breath sounds normal. No respiratory distress.   Musculoskeletal:        Thoracic back: He exhibits decreased range of motion, tenderness and pain. He exhibits no bony tenderness, no swelling, no edema, no deformity, no laceration, no spasm and normal pulse.        Lumbar back: He exhibits decreased range of motion, tenderness and pain. He exhibits no bony tenderness, no swelling, no edema, no deformity, no laceration, no spasm and normal pulse.        Back:    Neurological: He is alert and oriented to person, place, and time.   Skin: Skin is warm and dry.   Psychiatric: He has a normal mood and affect.   Nursing note  and vitals reviewed.       Orders Placed     No orders of the defined types were placed in this encounter.       Diagnostic Results       The results of the diagnostic studies below have been reviewed by myself:    Labs  Results     ** No results found for the last 24 hours. **          Radiologic Studies  Radiology Results (24 Hour)     ** No results found for the last 24 hours. **          EKG: none     MDM / Critical Care     Blood pressure 135/84, pulse 70, temperature 97.8 F (36.6 C), temperature source Oral, resp. rate 18, height 1.626 m, weight 53.524 kg, SpO2 99 %.    I reviewed the vital signs, nursing notes, past medical history, past surgical history, family history and social history.   I have reviewed the patient's previous charts.   O2 sat- saturation: 99 ; Oxygen use: RA Interpretation: Normal    ED Course:    This patient presents with back/neck pain that seems musculoskeletal in origin. Based on my history and examination, several diagnoses including cauda equina syndrome, infection, AAA, kidney stones, have been considered and are unlikely. No life-threatening etiologies of back pain are discernible at this time, and the patient's symptoms will be managed with symptomatic care. I advised the patient to return to the emergency department immediately should they develop worsening pain, fever, weakness, bowel or bladder symptoms, or any acute concerns .  The patient was deemed well enough for discharge.  Diagnostic impression and plan were discussed with the patient and/or family.  If ordered, results of lab/radiology tests were discussed with the patient and/or family. Questions were answered and concerns were addressed.  The patient was encouraged to follow-up with their primary care provider or specialist if not improved.    Patient verbalized understanding and was agreeable to plan.     I discussed this case with the attending physician in the emergency  department and they agree with the  assessment and treatment plan.     Follow-up Information     Follow up with Yevette Edwards, MD. Schedule an appointment as soon as possible for a visit in 3 days.    Specialty:  Emergency Medicine    Why:  As needed    Contact information:    [address] [facility]  Emergency Department  [place] [address]  [phone]            Procedures     Diagnosis / Disposition     Clinical Impression  1. Chronic back pain        Medications Given in ED:  E5-ED5-A - MAR ACTION REPORT  (last 24 hrs)         FITZGERALD, Eda Paschal, RN       Medication Name Action Time Action Site Route Rate Dose Reason Comments User     HYDROcodone-acetaminophen (NORCO) 5-325 MG per tablet 1 tablet 09/21/14 1251 Given  Oral  1 tablet   Burtis Junes, RN     diazepam (VALIUM) tablet 5 mg 09/21/14 1251 Given  Oral  5 mg   Burtis Junes, RN     predniSONE (DELTASONE) tablet 60 mg 09/21/14 1249 Given  Oral  60 mg   Burtis Junes, RN                Disposition  ED Disposition     Discharge [name] discharge to home/self care.    Condition at disposition: Stable            Prescriptions  Discharge Medication List as of 09/21/2014 12:44 PM      START taking these medications    Details   diazepam (VALIUM) 5 MG tablet Take 1 tablet (5 mg total) by mouth every 12 (twelve) hours as needed., Starting 09/21/2014, Until Discontinued, Print      HYDROcodone-acetaminophen (NORCO) 5-325 MG per tablet Take 1 tablet by mouth every 8 (eight) hours as needed for Pain., Starting 09/21/2014, Until Discontinued, Print      predniSONE (DELTASONE) 20 MG tablet Take 50mg  (2.5 tabs) day 1, take 40mg  (2 tabs) day 2, take 30mg  (1.5 tabs) day 3, take 20mg  (1 tab) day 4, take 10mg  (1/2) day 5 & 6., Print               Rulon Sera, NP  12:33 PM      This chart was generated by an EMR and may contain errors, including typographical, or omissions not intended by the user.         Rulon Sera, NP  09/21/14 [phone]

## 2014-10-15 ENCOUNTER — Emergency Department: Payer: Self-pay

## 2014-10-15 ENCOUNTER — Emergency Department
Admission: EM | Admit: 2014-10-15 | Discharge: 2014-10-15 | Disposition: A | Discharge status: Home / Self Care | Payer: Self-pay | Attending: Emergency Medicine | Admitting: Emergency Medicine

## 2014-10-15 DIAGNOSIS — M5442 Lumbago with sciatica, left side: Secondary | ICD-10-CM | POA: Insufficient documentation

## 2014-10-15 DIAGNOSIS — M5440 Lumbago with sciatica, unspecified side: Secondary | ICD-10-CM

## 2014-10-15 DIAGNOSIS — G894 Chronic pain syndrome: Secondary | ICD-10-CM | POA: Insufficient documentation

## 2014-10-15 DIAGNOSIS — M5441 Lumbago with sciatica, right side: Secondary | ICD-10-CM | POA: Insufficient documentation

## 2014-10-15 HISTORY — DX: Other chronic pain: G89.29

## 2014-10-15 MED ORDER — LORAZEPAM 0.5 MG PO TABS
1.0000 mg | ORAL_TABLET | Freq: Once | ORAL | Status: AC
Start: 2014-10-15 — End: 2014-10-15
  Administered 2014-10-15: 1 mg via ORAL

## 2014-10-15 MED ORDER — KETOROLAC TROMETHAMINE 10 MG PO TABS
ORAL_TABLET | ORAL | Status: AC
Start: 2014-10-15 — End: ?
  Filled 2014-10-15: qty 1

## 2014-10-15 MED ORDER — KETOROLAC TROMETHAMINE 60 MG/2ML IM SOLN
60.0000 mg | Freq: Once | INTRAMUSCULAR | Status: DC
Start: 2014-10-15 — End: 2014-10-15

## 2014-10-15 MED ORDER — PREDNISONE 20 MG PO TABS
60.0000 mg | ORAL_TABLET | Freq: Every day | ORAL | Status: AC
Start: 2014-10-15 — End: 2014-10-20

## 2014-10-15 MED ORDER — KETOROLAC TROMETHAMINE 10 MG PO TABS
10.0000 mg | ORAL_TABLET | Freq: Once | ORAL | Status: AC
Start: 2014-10-15 — End: 2014-10-15
  Administered 2014-10-15: 10 mg via ORAL

## 2014-10-15 MED ORDER — LORAZEPAM 0.5 MG PO TABS
ORAL_TABLET | ORAL | Status: AC
Start: 2014-10-15 — End: ?
  Filled 2014-10-15: qty 2

## 2014-10-15 NOTE — Discharge Instructions (Signed)
Back Care Tips    These are things you can do to prevent a recurrence of acute back pain and to reduce symptoms from chronic back pain:   Maintain a healthy weight. If you are overweight, losing weight will help most types of back pain.   Exercise is an important part of recovery from most types of back pain. The back is supported by the muscles behind and in front of the spine. This means both the back muscles and the abdominal muscles must be strengthened to provide better support for your spine.   Swimming and brisk walking are good overall exercises to improve your fitness level.   Practice safe lifting methods (below).   Practice good posture when sitting, standing and walking. Avoid prolonged sitting. This puts more stress on the lower back than standing or walking.   Wear quality shoes with sufficient arch support. Foot and ankle alignment can affect back symptoms. Women should avoid high heels.   Therapeutic massage can help relieve acute and chronic back pain.   During the first two days after an acute injury or flare-up of chronic back pain, apply anice packto the painful area for 20 minutes every 2-4 hours. This will reduce swelling and pain. Heat (hot shower, hot bath, or heating pad) works well for muscle spasm. You can start with ice, then switch to heat after two days. Some patients feel best alternating ice and heat treatments. Use the one method that feels the best to you.   You may use acetaminophen (Tylenol) or ibuprofen (Motrin, Advil) to control pain, unless another medicine was prescribed. [NOTE: If you have chronic liver or kidney disease or ever had a stomach ulcer or GI bleeding, talk with your doctor before using these medicines.]  Lumbar Stretch   Here is a simple stretching exercise that will help relax muscle spasm and keep your back more limber. If exercise makes your back pain worse, don't do it.   Lie on your back with your knees bent and both feet on the  ground.   Slowly raise your left knee to your chest as you flatten your lower back against the floor. Hold for 5 seconds.   Relax and repeat the exercise with your right knee.   Do 10 of these exercises for each leg.  Safe Lifting Method    Don't bend over at the waist to lift an object off the floor. Instead, bend your knees and hips in a squat.   Keep your back and head upright.   Hold the object close to your body, directly in front of you.   Straighten your legs to lift the object.   Lower the object to the floor in the reverse fashion.   If you must slide something across the floor, push it.  Posture Tips   SITTING  Sit in chairs with straight backs or low-back support. Keep your knees a little higher than your hips. If necessary, use a low stool to prop your feet on, so your feet are resting on a solid surface.  When driving, sit up straight. Adjust the seat forward so you are not leaning toward the steering wheel. A small pillow or rolled towel behind your lower back may help if you are driving long distances.  STANDING  When standing for long periods, shift most of your weight to one leg at a time. Alternate legs every few minutes.  SLEEPING  The best way to sleep is on your side with your knees   bent. Put a low pillow under your head to support your neck in a neutral spine position. Avoid thick pillows that bend your neck to one side. Put a pillow between your legs to further relax your lower back. If you sleep on your back, put pillows under your knees to support your legs in a slightly flexed position. Use a firm mattress. If your mattress sags, replace it, or use a 1/2-inch plywood board under the mattress to add support.  Follow Up with your doctor or as directed by our staff.  [NOTE: If X-rays, a CT scan or an MRI scan were taken, they will be reviewed by a radiologist. You will be notified of any new findings that may affect your care.]  Return Promptly or contact your doctor if any of  the following occur:   Pain becomes worse or spreads to your arms or legs   Weakness or numbness in one or both arms or legs   Loss of bowel or bladder control   Numbness in the groin area   2000-2015 [employer] [address], [place] [address]. All rights reserved. This information is not intended as a substitute for professional medical care. Always follow your healthcare professional's instructions.      Thank you for choosing [facility] for your emergency care needs. We strive to provide EXCELLENT care to you and your family.      YOUR ACCURATE CONTACT INFORMATION IS VERY IMPORTANT    Before leaving please check with registration to make sure we have an up-to-date contact number. A Toll-free post discharge customer service number is available to update your registration/insurance information as well as answer any billing questions or concerns. That number is [phone].       IF YOU DO NOT CONTINUE TO IMPROVE OR YOUR CONDITION WORSENS, PLEASE CONTACT YOUR DOCTOR OR RETURN IMMEDIATELY TO THE EMERGENCEY DEPARTMENT.      EXTRA AVAILABLE RESOURCES:    1. DOCTOR REFERRALS  a. Call  our Physician Referral Line @ [phone] If you need  further assistance with referrals to primary care or specialty services our Case Manager may assist at [phone].  2. FREE HEALTH SERVICES  a. www.freemedicalsearch.org  b. http://www.[facility].org  May be utilized if you need help with health or social services, please call 2-1-1 for a free referral to resources in your area. 2-1-1 is a free service connecting people with information on health insurance, free clinics, pregnancy, mental health, dental care, food assistance, housing, and substance abuse counseling.  3. MEDICAL RECORDS AND TESTS  Certain laboratory test results do not come back the same day, for example: urine cultures may take 3 days. We will attempt to contact you if other important findings are noted. Some lab  testing may take 2-5 days. Radiology films are reviewed again to ensure accuracy. If there is any discrepancy, we will notify you. If you have questions or concerns, our Case Manager is available Monday thru Friday, 7:30a-3:00p, for follow up or test result questions. Please call [phone].  4. ED PATIENT ADVOCATE SERVICES: [phone]. Contact for general questions and concerns, daily 11:00a-11:00p.      DISCHARGE MESSAGE:     YOU ARE THE MOST IMPORTANT FACTOR IN YOUR RECOVERY. Follow the above instructions carefully. Take your medicines as prescribed. Most important, see your doctor in follow-up as recommended by your ED physician.  Call your doctor if your condition worsens or if you have any new, worsening, or severe   symptoms. If you need further care and cannot be seen by your recommended follow-up doctor, the Emergency Department Case Manager will be available to help as needed [phone].  If you require immediate assistance, return to the Emergency Department or call 911.    Pharmacy information  For your convenience please visit [facility] for your prescription needs. [facility] has extended evening and weekend hours, including holidays, to ensure patients can begin taking medications as soon as possible.  Most insurance plans are accepted.    Pharmacy Hours:  Monday - Friday: 8:30a to 8:30p  Saturday: 9a to 1p  Sunday: 9a to 1p and 6p to 9p  Holidays: 9a to 1p    Pharmacy Phone: [phone]      [facility] has been providing home care solutions for independent living since 1984. Servicing [place] and [place]. [facility] is a full service home medical provider of home oxygen and respiratory care, medical equipment and supplies. [phone].    Thanks Again, for allowing [facility]   Emergency Department to serve you.

## 2014-10-15 NOTE — ED Provider Notes (Signed)
[name]  EMERGENCY DEPARTMENT  History and Physical Exam       Patient Name: [name] [name]  Encounter Date:  10/15/2014  Treating Provider: Arlice Colt, PA-C  Supervising Physician: [name] Furlong, MD  [name] [name]: [name] Edwards, MD  Patient DOB:  [birth year]  MRN:  [id number]  Room:  E55/E55-A      History of Presenting Illness     Chief complaint: Back Pain      [name] [name] is a 54 y.o. male who presents with chronic back pain.  States that it has gotten worse lately. States pain is from his neck to the tailbone and radiates to bilateral posterior legs to the knee on the left and to the foot on the right. Denies any known recent injury.  Denies weakness, bowel/bladder dysfunction, saddle anesthesia, fever, weight loss, night sweats, IVDA, or personal h/o cancer.          Review of Systems     Review of systems is otherwise negative except as indicated in HPI.        Allergies & Medications     Pt is allergic to naprosyn and tramadol.    Discharge Medication List as of 10/15/2014  7:19 PM      CONTINUE these medications which have NOT CHANGED    Details   diazepam (VALIUM) 5 MG tablet Take 1 tablet (5 mg total) by mouth every 12 (twelve) hours as needed., Starting 09/21/2014, Until Discontinued, Print      HYDROcodone-acetaminophen (NORCO) 5-325 MG per tablet Take 1 tablet by mouth every 8 (eight) hours as needed for Pain., Starting 09/21/2014, Until Discontinued, Print              Past Medical History     Pt has a past medical history of Arthritis; Muscle pain; Tobacco dependence; Chronic shoulder pain, right; and Chronic back pain.  Reviewed and otherwise negative.     Past Surgical History     Pt  has past surgical history that includes TRACHEOSTOMY; Leg Surgery; Fracture surgery; Hernia repair; and Fracture surgery.       Family History     The family history includes Diabetes in his brother.       Social History     Pt reports that he has been smoking.  He does not have any smokeless tobacco  history on file. He reports that he does not drink alcohol or use illicit drugs.       Physical Exam     Blood pressure 111/79, pulse 95, temperature 98.1 F (36.7 C), resp. rate 16, height 1.626 m, weight 51.3 kg, SpO2 100 %.      Constitutional: Well developed, well nourished, active, in no apparent distress.  HEENT:  NCAT.  No oropharyngeal lesions, moist mucous membranes.  Cervical AROM WNL.   Eyes: PERRL. EOMI.  No conjunctivitis or discharge.  Visual acuity grossly intact.    Respiratory: Effort normal without accessory muscle use.   Musculoskeletal: Diffuse TTP throughout cervical, thoracic and lumbar spine.   Strength is 5/5 in quads, hams, iliopsoas, EHL, ankle dorsiflexion and plantarflexion.  Sensation is intact to light touch in L1-S2 dermatomes  Negative straight leg raise.   Patellar reflexes are 2/4 on the right side and 2/4 on the left.  Achilles reflexes are 2/4 on the right side and 2/4 on the left.    Neurological: Pt is alert. CN II-XII grossly intact. Moving all extremities without apparent deficit.   Psychiatric: Affect is appropriate. There is  no agitation.   Skin: Skin is warm and dry.  No cyanosis, jaundice or pallor. No rash or lesions noted.       Diagnostic Results     The results of the diagnostic studies below have been reviewed by myself:    Labs  Results     ** No results found for the last 24 hours. **          Radiologic Studies  No results found.         ED Course and Medical Decision Making     ED Medication Orders     Start     Status Ordering Provider    10/15/14 1926  ketorolac (TORADOL) tablet 10 mg   Once in ED     Route: Oral  Ordered Dose: 10 mg     Last MAR action:  Given [name] [name]    10/15/14 1923     Once in ED,   Status:  Discontinued     Route: Intramuscular  Ordered Dose: 60 mg     Discontinued [name] [name]    10/15/14 1923  LORazepam (ATIVAN) tablet 1 mg   Once in ED     Route: Oral  Ordered Dose: 1 mg     Last MAR action:  Given [name] [name]               Differential diagnosis includes mechanical back pain, lumbosacral radiculopathy, vertebral fracture, epidural compression syndrome/cauda equina syndrome, myelopathy, epidural abscess, diskitis, vertebral osteomyelitis, renal colic, pyelonephritis, AAA, psoas abscess, malignancy.    History and exam consistent with mechanical low back pain. No suggestion of malignancy, infection, fracture, epidural compression syndrome or other diseases considered above. Pt advised to avoid activities that exacerbate pain, but to remain as active as possible. Advised to return for new or worsening symptoms, bowel/bladder dysfunction, weakness. Pt understands and agrees with treatment plan.     In addition to the above history, please see nursing notes. Allergies, meds, past medical, family, social hx, and the results of the diagnostic studies performed have been reviewed by myself.  This chart was generated by an EMR and may contain errors or omissions not intended by the user.     Procedures / Critical Care     None     Diagnosis / Disposition     Clinical Impression  1. Bilateral low back pain with sciatica, sciatica laterality unspecified    2. Chronic pain syndrome        Disposition  ED Disposition     Discharge [name] [name] discharge to home/self care.    Condition at disposition: Stable              Follow up for Discharged Patients  [name] Edwards, MD  [address] [name]  Emergency Department  [place] [address]  [phone]          [name]  [address]  [place] [address]  [phone]        [name]  [address]  [place] [address]  [phone]    As needed      Prescriptions for Discharged Patients  Discharge Medication List as of 10/15/2014  7:19 PM                   [name] [name], [place]  10/16/14 1627

## 2014-10-15 NOTE — ED Notes (Signed)
Pt states, "I have back problems". Pt reports a chronic back problem that his PCP "usualy gives me Norco for but I don't have insurance so I can't go to him". Pt states that he took a long drive today and it made his back pin worse. Pain 8/10. FULL ROM of back with pain according to pt.

## 2014-10-17 ENCOUNTER — Emergency Department
Admission: EM | Admit: 2014-10-17 | Discharge: 2014-10-18 | Disposition: A | Discharge status: Home / Self Care | Payer: Self-pay | Attending: Emergency Medicine | Admitting: Emergency Medicine

## 2014-10-17 ENCOUNTER — Emergency Department: Payer: Self-pay

## 2014-10-17 DIAGNOSIS — W19XXXA Unspecified fall, initial encounter: Secondary | ICD-10-CM | POA: Insufficient documentation

## 2014-10-17 DIAGNOSIS — R0781 Pleurodynia: Secondary | ICD-10-CM | POA: Insufficient documentation

## 2014-10-17 MED ORDER — HYDROMORPHONE HCL 2 MG/ML IJ SOLN
INTRAMUSCULAR | Status: AC
Start: 2014-10-17 — End: ?
  Filled 2014-10-17: qty 1

## 2014-10-17 MED ORDER — ONDANSETRON 4 MG PO TBDP
ORAL_TABLET | ORAL | Status: AC
Start: 2014-10-17 — End: ?
  Filled 2014-10-17: qty 1

## 2014-10-17 MED ORDER — LORAZEPAM 0.5 MG PO TABS
1.0000 mg | ORAL_TABLET | Freq: Once | ORAL | Status: DC
Start: 2014-10-17 — End: 2014-10-18

## 2014-10-17 MED ORDER — VH HYDROMORPHONE HCL PF 1 MG/ML CARPUJECT
0.5000 mg | Freq: Once | INTRAMUSCULAR | Status: DC
Start: 2014-10-17 — End: 2014-10-18

## 2014-10-17 MED ORDER — VH HYDROMORPHONE HCL PF 1 MG/ML CARPUJECT
2.0000 mg | Freq: Once | INTRAMUSCULAR | Status: AC
Start: 2014-10-17 — End: 2014-10-17
  Administered 2014-10-17: 2 mg via INTRAMUSCULAR

## 2014-10-17 MED ORDER — VH HYDROMORPHONE HCL 1 MG/ML (NARRATOR)
INTRAMUSCULAR | Status: AC
Start: 2014-10-17 — End: ?
  Filled 2014-10-17: qty 1

## 2014-10-17 MED ORDER — ONDANSETRON 4 MG PO TBDP
8.0000 mg | ORAL_TABLET | Freq: Once | ORAL | Status: AC
Start: 2014-10-17 — End: 2014-10-17
  Administered 2014-10-17: 8 mg via ORAL

## 2014-10-17 NOTE — ED Notes (Signed)
Pt resting w/ eyes closed, aroused to be medicated; VS updated;

## 2014-10-17 NOTE — ED Notes (Signed)
Contacted Darl Pikes at [facility] for referral for this patient to have ESI through [facility] or [facility].  Faxed her MRI report and record to schedule ESI for patient.

## 2014-10-17 NOTE — ED Notes (Signed)
Pt w/ hx chronic back pain; reports he fell tonight on icy steps, increasing pain in Left lower side of back;

## 2014-10-17 NOTE — ED Provider Notes (Signed)
[facility]  EMERGENCY DEPARTMENT  History and Physical Exam     Patient Name: [name] [name],[name] [name]  Encounter Date:  10/17/2014  Attending Physician: Elmon Else. Grace Isaac, M.D.   Room:  C19/C19-A  Patient DOB:  05/31/1961  Age: 54 y.o. male  MRN:  [id number]  PCP: Rosaura Carpenter, NP      MDM:  Diagnosis / Disposition:     10:53 PM -- Advised of assessment and of treatment plan to be employed while in ER. Answered all questions.    This patient presents to the Emergency Department following a fall or traumatic event.   Evaluation, examination and treatment for this patient was performed and revealed that there were no serious injuries identified in the ED.  In addition this patient seems to be very low risk for any delayed serious injury. Many sequelae of their event were considered in the differential diagnosis including fracture, closed head injury, contusion, abrasion, laceration and organ injury. Any serious sequelae that would require admission were thought unlikely. The patient was felt to be stable for discharge home.  The diagnostic impression and plan and appropriate follow-up were discussed and agreed upon with the patient and/or family.  If performed the results of lab/radiology tests were reviewed and discussed with the patient and/or family. All questions were answered and concerns addressed.  MVA precautions have been given and the patient was warned to return immediately for worsening symptoms or any acute concerns.    In addition to the above history, please see nursing notes. Allergies, meds, past medical, family, social hx, and the results of the diagnostic studies performed have been reviewed by myself.      Final Impression  1. Fall, initial encounter    2. Rib pain on left side        Disposition  ED Disposition     Discharge [name] discharge to home/self care.    Condition at disposition: Stable            Follow up  Rosaura Carpenter, NP  [address]  [place]  [address]  [phone]            Prescriptions  Discharge Medication List as of 10/18/2014  1:25 AM      START taking these medications    Details   albuterol (PROVENTIL HFA;VENTOLIN HFA) 108 (90 BASE) MCG/ACT inhaler Inhale 1-2 puffs into the lungs every 4 (four) hours as needed for Wheezing or Shortness of Breath., Starting 10/18/2014, Until Discontinued, Print      !! HYDROcodone-acetaminophen (NORCO) 5-325 MG per tablet Take 1 tablet by mouth every 6 (six) hours as needed for Pain., Starting 10/18/2014, Until Discontinued, Print      lidocaine (LIDODERM) 5 % Place 1 patch onto the skin daily. Remove & Discard patch within 12 hours or as directed by MD, Starting 10/18/2014, Until Tue 10/24/14, Print       !! - Potential duplicate medications found. Please discuss with provider.            History of Presenting Illness:     Vitals: Blood pressure 110/64, pulse 70, temperature 98.1 F (36.7 C), temperature source Oral, resp. rate 20, height 1.626 m, weight 51 kg, SpO2 99 %.    Chief complaint: Rib pain and Fall    HPI/ROS is limited by: none  HPI/ROS given by: patient    [name] [name] is a 54 y.o. male who presents to the ED with complaint of fall.  Patient was walking down icy steps tonight when he slipped and fell.  Patient hit his left ribs.  Patient notes severe pain.  Patient's significant other states that the patient usually does not complain about pain.  Patient was seen two days ago for chronic back pain.  Patient notes no relief with the side pain.        Review of Systems:  Physical Exam:     Review of Systems   Constitutional: Negative for fever, chills and diaphoresis.   HENT: Negative for congestion, ear pain, rhinorrhea and sore throat.    Eyes: Negative.    Respiratory: Positive for shortness of breath. Negative for cough, chest tightness and wheezing.    Cardiovascular: Positive for chest pain. Negative for palpitations and leg swelling.   Gastrointestinal: Negative for nausea, vomiting, abdominal pain,  diarrhea and blood in stool.   Endocrine: Negative.    Genitourinary: Negative for dysuria, urgency, frequency, hematuria, flank pain, scrotal swelling, penile pain and testicular pain.   Musculoskeletal: Negative for myalgias, back pain and neck pain.   Skin: Negative for rash and wound.   Allergic/Immunologic: Negative.    Neurological: Negative for dizziness, weakness, light-headedness and headaches.   Psychiatric/Behavioral: Negative for suicidal ideas, self-injury and dysphoric mood. The patient is not nervous/anxious.    All other systems reviewed and are negative.      Physical Exam   Constitutional: He is oriented to person, place, and time. He appears well-developed and well-nourished. No distress.   HENT:   Head: Normocephalic and atraumatic.   Nose: No rhinorrhea.   Eyes: Right eye exhibits no discharge. Left eye exhibits no discharge. Right conjunctiva is not injected. Left conjunctiva is not injected. No scleral icterus. Right pupil is round. Left pupil is round. Pupils are equal.   Pulmonary/Chest: Effort normal. He exhibits tenderness.       Abdominal: He exhibits no distension.   Musculoskeletal: Normal range of motion.   Neurological: He is alert and oriented to person, place, and time.   Skin: Skin is warm and dry. No rash noted. He is not diaphoretic. No erythema. No pallor.   Psychiatric: He has a normal mood and affect. His behavior is normal. Judgment and thought content normal.   Nursing note and vitals reviewed.         Allergies / Medications:     Pt is allergic to naprosyn and tramadol.    Discharge Medication List as of 10/18/2014  1:25 AM      CONTINUE these medications which have NOT CHANGED    Details   predniSONE (DELTASONE) 20 MG tablet Take 3 tablets (60 mg total) by mouth daily., Starting 10/15/2014, Until Fri 10/20/14, Print      diazepam (VALIUM) 5 MG tablet Take 1 tablet (5 mg total) by mouth every 12 (twelve) hours as needed., Starting 09/21/2014, Until Discontinued, Print      !!  HYDROcodone-acetaminophen (NORCO) 5-325 MG per tablet Take 1 tablet by mouth every 8 (eight) hours as needed for Pain., Starting 09/21/2014, Until Discontinued, Print       !! - Potential duplicate medications found. Please discuss with provider.            Past History:     Medical: Pt has a past medical history of Arthritis; Muscle pain; Tobacco dependence; Chronic shoulder pain, right; and Chronic back pain.    Surgical: Pt  has past surgical history that includes TRACHEOSTOMY; Leg Surgery; Fracture surgery; Hernia repair; and Fracture surgery.  Family: The family history includes Diabetes in his brother.    Social: Pt reports that he has been smoking.  He does not have any smokeless tobacco history on file. He reports that he does not drink alcohol or use illicit drugs.        Diagnostic Results:     The results of the diagnostic studies have been reviewed by myself:    Radiologic Studies  US Abdomen Complete    10/17/2014    Unremarkable complete abdominal ultrasound. No sonographic evidence of cholelithiasis, acute cholecystitis, nephrolithiasis, or hydronephrosis.  ReadingStation:[id number]      Lab Studies  Labs Reviewed - No data to display        Procedure / EKG:     none        ATTESTATIONS     This chart was generated by an EMR and may contain errors or additions/omissions not intended by the user.    Elmon Else. Grace Isaac, M.D.         Etter Sjogren, MD  10/18/14 [phone]

## 2014-10-18 MED ORDER — ALBUTEROL SULFATE HFA 108 (90 BASE) MCG/ACT IN AERS
1.0000 | INHALATION_SPRAY | RESPIRATORY_TRACT | Status: DC | PRN
Start: 2014-10-18 — End: 2014-11-19

## 2014-10-18 MED ORDER — LIDOCAINE 5 % EX PTCH
1.0000 | MEDICATED_PATCH | Freq: Every day | CUTANEOUS | Status: AC
Start: 2014-10-18 — End: 2014-10-24

## 2014-10-18 MED ORDER — HYDROCODONE-ACETAMINOPHEN 5-325 MG PO TABS
1.0000 | ORAL_TABLET | Freq: Four times a day (QID) | ORAL | Status: DC | PRN
Start: 2014-10-18 — End: 2014-12-15

## 2014-10-18 NOTE — Discharge Instructions (Signed)
Mechanical Fall  You have had a fall today. It appears that the cause is "mechanical". That means that you slipped, tripped or lost your balance. If your fall had been due to fainting or a seizure, further tests would be required.  Home Care:  1. Rest today and resume your normal activities when you are feeling back to normal.  2. If you were injured during the fall, follow the advice from your doctor regarding care of your injury.  3. You may use acetaminophen (Tylenol) or ibuprofen (Motrin, Advil) to control pain, unless another pain medicine was prescribed. [NOTE: If you have chronic liver or kidney disease or ever had a stomach ulcer or GI bleeding, talk with your doctor before using these medicines.]  Fall Prevention:   Was there anything that caused your fall that can be fixed, removed, or replaced?   Make your home safe by keeping walkways clear of objects you may trip over.   Use non-slip pads under rugs.   Do not walk in poorly lit areas.   Do not stand on chairs or wobbly ladders.   Use caution when reaching overhead or looking upward. This position can cause a loss of balance.   Be sure your shoes fit properly, have non-slip bottoms and are in good condition.   Be cautious when going up and down curbs, and walking on uneven sidewalks.   If your balance is poor, consider using a cane or walker.   Stay as active as you can. Balance, flexibility, strength, and endurance all come from exercise. They all play a role in preventing falls.  Follow Up  with your doctor or as advised by our staff.  Get Prompt Medical Attention  if any of the following occur:   Repeated mechanical falls, or unexplained falls   Dizziness, fainting or seizure   Severe headache   Chest pain or shortness of breath   Palpitations (very rapid or very slow or irregular heartbeat)   Blood in vomit, stools (black or red color)   Weakness of an arm or leg or one side of the face   Difficulty with speech or vision    2000-2015 [employer] [address], [place] [address]. All rights reserved. This information is not intended as a substitute for professional medical care. Always follow your healthcare professional's instructions.

## 2014-10-18 NOTE — ED Notes (Signed)
Pt returned from xray; ambulated to BR w/out difficulty;

## 2014-10-30 ENCOUNTER — Emergency Department: Payer: Self-pay

## 2014-10-30 ENCOUNTER — Emergency Department
Admission: EM | Admit: 2014-10-30 | Discharge: 2014-10-30 | Disposition: A | Discharge status: Home / Self Care | Payer: Self-pay | Attending: Emergency Medicine | Admitting: Emergency Medicine

## 2014-10-30 DIAGNOSIS — M545 Low back pain, unspecified: Secondary | ICD-10-CM

## 2014-10-30 DIAGNOSIS — W001XXA Fall from stairs and steps due to ice and snow, initial encounter: Secondary | ICD-10-CM | POA: Insufficient documentation

## 2014-10-30 DIAGNOSIS — F17200 Nicotine dependence, unspecified, uncomplicated: Secondary | ICD-10-CM | POA: Insufficient documentation

## 2014-10-30 DIAGNOSIS — S3992XA Unspecified injury of lower back, initial encounter: Secondary | ICD-10-CM | POA: Insufficient documentation

## 2014-10-30 MED ORDER — OXYCODONE-ACETAMINOPHEN 5-325 MG PO TABS
ORAL_TABLET | ORAL | Status: DC
Start: 2014-10-30 — End: 2014-11-19

## 2014-10-30 MED ORDER — METAXALONE 800 MG PO TABS
800.0000 mg | ORAL_TABLET | Freq: Three times a day (TID) | ORAL | Status: DC
Start: 2014-10-30 — End: 2014-11-19

## 2014-10-30 NOTE — Discharge Instructions (Signed)
Back Pain NOS     You have been seen for back pain.     Back pain can happen anywhere from the neck down to the low back. Back pain has many different causes. Some of the more common are: Bone pain, muscle strain, muscle spasm, pain from overuse, and pinched nerves. Other problems can cause what feels like back pain. But the pain is really coming from another organ. A kidney infection can cause lower back pain.     Your doctor did not find any pain over the bones in your back (even though you might have pain in the muscles of the back). This means it is very unlikely that you have a broken bone (fracture) in your back. Your doctor did not think it was necessary to take an x-ray.     The doctor still does not know the exact cause of your pain. Your problem does not seem to be from a dangerous cause. It is OK for you to go home today.     Some things you can try to help your back feel better are:  · Apply a warm damp washcloth to the back where you have pain for 20 minutes at a time, at least 4 times per day.  · Have someone massage the sore parts of your back.  · Don t do any heavy lifting or bending. You can go back to normal daily activities if they don t make the pain worse.  · You can use anti-inflammatory pain medicine for your pain. This could be Ibuprofen (Advil® or Motrin®). You can buy these at most stores. Follow the directions on the package.     This pain may last for the next few days. If your pain gets better, you probably do not need to see a doctor. However, if your symptoms get worse or you have new symptoms, you should return here or go to the nearest Emergency Department.     Call your doctor or go to the nearest Emergency Department if you your pain does not improve within 4 weeks or your pain is bad enough to seriously limit your normal activities.     YOU SHOULD SEEK MEDICAL ATTENTION IMMEDIATELY, EITHER HERE OR AT THE NEAREST EMERGENCY DEPARTMENT, IF ANY OF THE FOLLOWING OCCURS:  · You think  the pain is coming from somewhere other than your back. This can include chest pain. This is sometimes from angina (heart pains) or other dangerous causes.  · You have shortness of breath, sweating, chest pain (or pressure, heaviness, indigestion, etc).  · You have abdominal (belly) pain that goes through to your back.  · Your arms and legs tingle or get numb (lose feeling).  · Your arms or legs are weak.  · You lose control of your bladder or bowels. If this were to happen, it may cause you to wet or soil yourself.  · You have problems urinating (peeing).  · You have fever (temperature higher than 100.4ºF / 38ºC).  · Your pain gets worse.

## 2014-10-30 NOTE — ED Provider Notes (Signed)
Physician/Midlevel provider first contact with patient: 10/30/14 2207         History     Chief Complaint   Patient presents with   . Fall   . Rib pain   . Back Pain     Historian: Patient    Chief Complaint: Back pain, left side pain   Location:  Back and left side  Timing:  Just prior to arrival  Severity:  Moderate  Quality:  Aching  Modifying Factors:  Worse with movement  Associated signs and symptoms:  No bowel bladder incontinence, no motor or sensory deficits  Context:      HPI:  54 year old male presented with complaint of low back pain and left side pain after slipping and falling again today.  Patient also has similar injury on October 17, 2014 and was seen at [facility] at that time.  Patient has had an MRI in October 2015 lumbar spine indicating multiple areas of bulging disc with foraminal narrowing.  Denies any motor or sensory deficits at this time.  Patient took Motrin prior to arrival without significant relief in symptoms.          Past Medical History   Diagnosis Date   . Arthritis    . Muscle pain      torn bicep    . Tobacco dependence    . Chronic shoulder pain, right    . Chronic back pain        Past Surgical History   Procedure Laterality Date   . Tracheostomy     . Leg surgery       left leg surgery as child from CP   . Fracture surgery       hip surgery    . Hernia repair     . Fracture surgery         Family History   Problem Relation Age of Onset   . Diabetes Brother        Social  History   Substance Use Topics   . Smoking status: Current Every Day Smoker -- 1.00 packs/day   . Smokeless tobacco: Not on file   . Alcohol Use: No       .     Allergies   Allergen Reactions   . Flexeril [Cyclobenzaprine]    . Naprosyn [Naproxen]    . Tramadol        Discharge Medication List as of 10/30/2014 10:38 PM      CONTINUE these medications which have NOT CHANGED    Details   ibuprofen (ADVIL,MOTRIN) 200 MG tablet Take 200 mg by mouth every 6 (six) hours as needed for Pain., Until  Discontinued, Historical Med      albuterol (PROVENTIL HFA;VENTOLIN HFA) 108 (90 BASE) MCG/ACT inhaler Inhale 1-2 puffs into the lungs every 4 (four) hours as needed for Wheezing or Shortness of Breath., Starting 10/18/2014, Until Discontinued, Print      diazepam (VALIUM) 5 MG tablet Take 1 tablet (5 mg total) by mouth every 12 (twelve) hours as needed., Starting 09/21/2014, Until Discontinued, Print      !! HYDROcodone-acetaminophen (NORCO) 5-325 MG per tablet Take 1 tablet by mouth every 8 (eight) hours as needed for Pain., Starting 09/21/2014, Until Discontinued, Print      !! HYDROcodone-acetaminophen (NORCO) 5-325 MG per tablet Take 1 tablet by mouth every 6 (six) hours as needed for Pain., Starting 10/18/2014, Until Discontinued, Print       !! - Potential duplicate medications  found. Please discuss with provider.           Review of Systems   Constitutional: Negative for fever and chills.   Respiratory: Negative for cough and shortness of breath.    Cardiovascular: Negative for chest pain and palpitations.   Gastrointestinal: Negative for nausea, vomiting and abdominal pain.   Genitourinary: Negative for dysuria and frequency.   Musculoskeletal: Positive for back pain. Negative for neck pain and neck stiffness.   Neurological: Negative for weakness and numbness.       Physical Exam    BP: 119/77 mmHg, Heart Rate: 82, Temp: 97.8 F (36.6 C), Resp Rate: 18, SpO2: 99 %, Weight: 52.164 kg     Physical Exam   Constitutional: He is oriented to person, place, and time. He appears well-developed and well-nourished.   HENT:   Head: Normocephalic and atraumatic.   Eyes: Right eye exhibits no discharge. Left eye exhibits no discharge. No scleral icterus.   Neck: No JVD present. No tracheal deviation present.   Cardiovascular: Normal rate and regular rhythm.  Exam reveals no gallop and no friction rub.    No murmur heard.  Pulmonary/Chest: No stridor. No respiratory distress. He has no wheezes. He has no rales. He  exhibits no tenderness.   Abdominal: Soft. He exhibits no distension. There is no tenderness. There is no rebound and no guarding.   Musculoskeletal:        Back:    Neurological: He is alert and oriented to person, place, and time.   Skin: Skin is warm and dry.   Psychiatric: He has a normal mood and affect. His behavior is normal. Judgment and thought content normal.         MDM and ED Course     ED Medication Orders     None              MDM  Number of Diagnoses or Management Options  Midline low back pain without sciatica:   Diagnosis management comments: I, Earline Mayotte MD, have been the primary provider for [name] [name] during this Emergency Dept visit.      DDx includes, but is not limited to: Low back pain, strain, contusion    Initial Plan: Analgesia, muscle relaxants, follow-up with PCP           Procedures    Clinical Impression & Disposition     Clinical Impression  Final diagnoses:   Midline low back pain without sciatica        ED Disposition     Discharge [name] [name] discharge to home/self care.    Condition at disposition: Stable             Discharge Medication List as of 10/30/2014 10:38 PM      START taking these medications    Details   metaxalone (SKELAXIN) 800 MG tablet Take 1 tablet (800 mg total) by mouth 3 (three) times daily., Starting 10/30/2014, Until Discontinued, Print      oxyCODONE-acetaminophen (PERCOCET) 5-325 MG per tablet 1-2 tablets by mouth every 4-6 hours as needed for pain;  Do not drive or operate machinery while taking this medicine, Print                       Earline Mayotte, MD  10/30/14 2257

## 2014-10-30 NOTE — ED Notes (Signed)
Pt states he was shovelling snow and slipped and fell down approx 7 cement steps at 2030. Pt c/o lt side, rib and back pain. Ambulatory to triage. Denies shortness of breath.

## 2014-11-07 ENCOUNTER — Emergency Department (HOSPITAL_BASED_OUTPATIENT_CLINIC_OR_DEPARTMENT_OTHER)
Admission: EM | Admit: 2014-11-07 | Discharge: 2014-11-07 | Disposition: A | Discharge status: Home / Self Care | Payer: Self-pay | Attending: Physician Assistant | Admitting: Physician Assistant

## 2014-11-07 ENCOUNTER — Encounter (HOSPITAL_BASED_OUTPATIENT_CLINIC_OR_DEPARTMENT_OTHER): Payer: Self-pay

## 2014-11-07 ENCOUNTER — Emergency Department (HOSPITAL_BASED_OUTPATIENT_CLINIC_OR_DEPARTMENT_OTHER): Payer: Self-pay

## 2014-11-07 DIAGNOSIS — M25519 Pain in unspecified shoulder: Secondary | ICD-10-CM | POA: Insufficient documentation

## 2014-11-07 DIAGNOSIS — G8929 Other chronic pain: Secondary | ICD-10-CM | POA: Insufficient documentation

## 2014-11-07 DIAGNOSIS — S61210A Laceration without foreign body of right index finger without damage to nail, initial encounter: Secondary | ICD-10-CM | POA: Insufficient documentation

## 2014-11-07 DIAGNOSIS — F17210 Nicotine dependence, cigarettes, uncomplicated: Secondary | ICD-10-CM | POA: Insufficient documentation

## 2014-11-07 MED ORDER — DIPHTH,PERTUSSIS(ACELL),TETANUS 2.5 LF UNIT-8 MCG-5 LF/0.5 ML IM SUSP
0.5000 mL | INTRAMUSCULAR | Status: AC
Start: 2014-11-07 — End: 2014-11-07
  Administered 2014-11-07: 0.5 mL via INTRAMUSCULAR
  Filled 2014-11-07: qty 0.5

## 2014-11-07 MED ORDER — CYCLOBENZAPRINE 10 MG TABLET
10.00 mg | ORAL_TABLET | Freq: Three times a day (TID) | ORAL | Status: DC | PRN
Start: 2014-11-07 — End: 2016-04-15

## 2014-11-07 MED ORDER — DICLOFENAC SODIUM 75 MG TABLET,DELAYED RELEASE
75.00 mg | DELAYED_RELEASE_TABLET | Freq: Two times a day (BID) | ORAL | Status: DC
Start: 2014-11-07 — End: 2016-04-15

## 2014-11-07 NOTE — Discharge Instructions (Signed)
Shoulder Pain  The shoulder is the joint that connects your arms to your body. The bones that form the shoulder joint include the upper arm bone (humerus), the shoulder blade (scapula), and the collarbone (clavicle). The top of the humerus is shaped like a ball and fits into a rather flat socket on the scapula (glenoid cavity). A combination of muscles and strong, fibrous tissues that connect muscles to bones (tendons) support your shoulder joint and hold the ball in the socket. Small, fluid-filled sacs (bursae) are located in different areas of the joint. They act as cushions between the bones and the overlying soft tissues and help reduce friction between the gliding tendons and the bone as you move your arm. Your shoulder joint allows a wide range of motion in your arm. This range of motion allows you to do things like scratch your back or throw a ball. However, this range of motion also makes your shoulder more prone to pain from overuse and injury.  Causes of shoulder pain can originate from both injury and overuse and usually can be grouped in the following four categories:  · Redness, swelling, and pain (inflammation) of the tendon (tendinitis) or the bursae (bursitis).  · Instability, such as a dislocation of the joint.  · Inflammation of the joint (arthritis).  · Broken bone (fracture).  HOME CARE INSTRUCTIONS   · Apply ice to the sore area.  · Put ice in a plastic bag.  · Place a towel between your skin and the bag.  · Leave the ice on for 15-20 minutes, 3-4 times per day for the first 2 days, or as directed by your health care provider.  · Stop using cold packs if they do not help with the pain.  · If you have a shoulder sling or immobilizer, wear it as long as your caregiver instructs. Only remove it to shower or bathe. Move your arm as little as possible, but keep your hand moving to prevent swelling.  · Squeeze a soft ball or foam pad as much as possible to help prevent swelling.  · Only take  over-the-counter or prescription medicines for pain, discomfort, or fever as directed by your caregiver.  SEEK MEDICAL CARE IF:   · Your shoulder pain increases, or new pain develops in your arm, hand, or fingers.  · Your hand or fingers become cold and numb.  · Your pain is not relieved with medicines.  SEEK IMMEDIATE MEDICAL CARE IF:   · Your arm, hand, or fingers are numb or tingling.  · Your arm, hand, or fingers are significantly swollen or turn white or blue.  MAKE SURE YOU:   · Understand these instructions.  · Will watch your condition.  · Will get help right away if you are not doing well or get worse.     This information is not intended to replace advice given to you by your health care provider. Make sure you discuss any questions you have with your health care provider.     Document Released: 07/02/2005 Document Revised: 07/11/2014 Document Reviewed: 09/06/2011  ExitCare® Patient Information ©2015 [employer].  Shoulder Range of Motion Exercises  The shoulder is the most flexible joint in the human body. Because of this it is also the most unstable joint in the body. All ages can develop shoulder problems. Early treatment of problems is necessary for a good outcome. People react to shoulder pain by decreasing the movement of the joint. After a brief period of time,   the shoulder can become "frozen". This is an almost complete loss of the ability to move the damaged shoulder. Following injuries your caregivers can give you instructions on exercises to keep your range of motion (ability to move your shoulder freely), or regain it if it has been lost.   EXERCISES  EXERCISES TO MAINTAIN THE MOBILITY OF YOUR SHOULDER:  Codman's Exercise or Pendulum Exercise  · This exercise may be performed in a prone (face-down) lying position or standing while leaning on a chair with the opposite arm. Its purpose is to relax the muscles in your shoulder and slowly but surely increase the range of motion and to relieve  pain.  ¨ Lie on your stomach close to the side edge of the bed. Let your weak arm hang over the edge of the bed. Relax your shoulder, arm and hand. Let your shoulder blade relax and drop down.  ¨ Slowly and gently swing your arm forward and back. Do not use your neck muscles; relax them. It might be easier to have someone else gently start swinging your arm.  ¨ As pain decreases, increase your swing. To start, arm swing should begin at 15 degree angles. In time and as pain lessens, move to 30-45 degree angles. Start with swinging for about 15 seconds, and work towards swinging for 3 to 5 minutes.  · This exercise may also be performed in a standing/bent over position.  ¨ Stand and hold onto a sturdy chair with your good arm. Bend forward at the waist and bend your knees slightly to help protect your back. Relax your weak arm, let it hang limp. Relax your shoulder blade and let it drop.  ¨ Keep your shoulder relaxed and use body motion to swing your arm in small circles.  ¨ Stand up tall and relax.  ¨ Repeat motion and change direction of circles.  ¨ Start with swinging for about 30 seconds, and work towards swinging for 3 to 5 minutes.  STRETCHING EXERCISES:  · Lift your arm out in front of you with the elbow bent at 90 degrees. Using your other arm gently pull the elbow forward and across your body.  · Bend one arm behind you with the palm facing outward. Using the other arm, hold a towel or rope and reach this arm up above your head, then bend it at the elbow to move your wrist to behind your neck. Grab the free end of the towel with the hand behind your back. Gently pull the towel up with the hand behind your neck, gradually increasing the pull on the hand behind the small of your back. Then, gradually pull down with the hand behind the small of your back. This will pull the hand and arm behind your neck further. Both shoulders will have an increased range of motion with repetition of this  exercise.  STRENGTHENING EXERCISES:  · Standing with your arm at your side and straight out from your shoulder with the elbow bent at 90 degrees, hold onto a small weight and slowly raise your hand so it points straight up in the air. Repeat this five times to begin with, and gradually increase to ten times. Do this four times per day. As you grow stronger you can gradually increase the weight.  · Repeat the above exercise, only this time using an elastic band. Start with your hand up in the air and pull down until your hand is by your side. As you grow stronger, gradually increase   the amount you pull by increasing the number or size of the elastic bands. Use the same amount of repetitions.  · Standing with your hand at your side and holding onto a weight, gradually lift the hand in front of you until it is over your head. Do the same also with the hand remaining at your side and lift the hand away from your body until it is again over your head. Repeat this five times to begin with, and gradually increase to ten times. Do this four times per day. As you grow stronger you can gradually increase the weight.     This information is not intended to replace advice given to you by your health care provider. Make sure you discuss any questions you have with your health care provider.     Document Released: 06/21/2003 Document Revised: 09/27/2013 Document Reviewed: 09/22/2005  ExitCare® Patient Information ©2015 [employer].

## 2014-11-07 NOTE — ED Nurses Note (Signed)
Non tramatic right shoulder pain with extension, posterior movement.

## 2014-11-07 NOTE — ED Provider Notes (Signed)
Pryor MontesMelinda R Angelina Venard, PA-C  [facility]  Emergency Department Visit Note    Date: 11/07/2014  Primary care provider: Pcp Not In System  Means of arrival: private car  History obtained by: patient  History limited by: none      Chief Complaint: shoulder pain    History of Present Illness     Curtis Martin, date of birth 10/25/1960, is a 54 y.o. male who presents to the Emergency Department complaining of shoulder pain.     The patient relates that 2 days ago he started noticing recurrence of pain in his right shoulder.  He notes that in 2011 he was diagnosed with a torn biceps tendon.  He did not have any insurance at that point in time to have the surgery done and the doctor at that point told him that he would have periodic pain.  He denies any new trauma or injury.  He does lift boxes sometimes and crates, but does not recall specifically having discomfort when he lifted one.  He also notes that he cut his right index finger a couple days ago and he is unsure of his last tetanus, so this will be updated for him today.  He describes the pain as a throbbing sensation.  He rates it a 9/10.  This is worse with lifting.  He indicates that the right anterior shoulder is the primary area of discomfort.  He has tried some over-the-counter Aleve without much improvement.  He does have an appointment to see his primary care provider on November 13, 2014, and [facility] his back and sciatica on November 30, 2014.  Currently, he is not on any medications.  He denies any fevers, chills, chest pain, difficulty breathing or belly pain.          Review of Systems     The pertinent positive and negative symptoms are as per HPI. All other systems reviewed and are negative.    Patient History      Past Medical History:  Past Medical History   Diagnosis Date    CP (cerebral palsy)            Past Surgical History:  Past Surgical History   Procedure Laterality Date     Hx knee surgery             Family History:  No family history on file.        Social History:  History   Substance Use Topics    Smoking status: Current Every Day Smoker -- 1.00 packs/day    Smokeless tobacco: Not on file    Alcohol Use: No      Comment: hasn't drank for three years.     History   Drug Use No       Medications:  Previous Medications    No medications on file       Allergies:   No Known Allergies    Physical Exam     Vital Signs:  Filed Vitals:    11/07/14 1200   BP: 112/70   Pulse: 78   Temp: 36.6 C (97.9 F)   Resp: 18   SpO2: 99%       The initial visit vital signs are reviewed as above.     Pulse Ox: 99% on None (Room Air); interpreted by me ZO:XWRUEAas:Normal    GENERAL:  This is a well appearing  54 y.o.  male  who is interactive, appropriate and showing no outward signs of distress.    HEAD:  Atraumatic, normocephalic.  HEENT:  Conjunctiva normal in appearance, no gross deformity noted.   NECK:  Supple, no meningeal signs.  No cervical adenopathy.  HEART:  Regular rate and rhythm without murmurs, rubs, or gallops.  LUNGS:  Clear to ascultation bilaterally  SKIN:  Warm, good color.  No rashes noted. Small healing laceration to the right index finger.  No evidence of infection.   NEURO/PSYCH:  Patient is interactive, appropriate and grossly intact.  VASCULAR: Radial pulses are intact and symmetrical.    EXTREMITIES:  Right shoulder examination reveals no obvious deformities.  It is tender along the anterior shoulder where the biceps tendon would insert.  He does have some right upper trapezius tenderness as well.  He actually has fairly good range of motion, but indicates discomfort with full abduction as well as extension.  He declines to try the supraspinatus testing as he states he knows that will hurt too much.  Other rotator cuff muscle testing is intact.  Pincer strength intact.  He has no bony tenderness to the elbow, wrist or hand.  Deltoid sensation is intact.            Diagnostics          Labs:  No results found for any visits on 11/07/14.  Labs reviewed and interpreted by me.    Radiology:   XR SHOULDER RIGHT SERIES  Interpreted by radiologist and independently reviewed by me.  no acute findings.       ED Progress Note/Medical Decision Making     Discussed with the patient at length that at this point in time we are not seeing anything on the x-ray.  I recommended followup with the primary care provider for further evaluation and potential physical therapy.  In the meantime, we will start him on some anti-inflammatories and muscle relaxers.  I encouraged him to do gentle range of motion exercises.  Certainly should things change or worsen in any way, he may return here for reevaluation.  The patient verbalized understanding and had no questions or concerns.          Orders Placed This Encounter    XR SHOULDER RIGHT SERIES    diclofenac sodium (VOLTAREN) 75 mg Oral Tablet, Delayed Release (E.C.)    cyclobenzaprine (FLEXERIL) 10 mg Oral Tablet       Pre-Disposition Vitals:  Filed Vitals:    11/07/14 1200   BP: 112/70   Pulse: 78   Temp: 36.6 C (97.9 F)   Resp: 18   SpO2: 99%       Clinical Impression      1. Acute exacerbation of chronic shoulder pain.  2. Acute laceration to the right index finger- no repair required, healing well.     Plan/Disposition     Discharged    Prescriptions:  New Prescriptions    CYCLOBENZAPRINE (FLEXERIL) 10 MG ORAL TABLET    Take 1 Tab (10 mg total) by mouth Three times a day as needed for Muscle spasms    DICLOFENAC SODIUM (VOLTAREN) 75 MG ORAL TABLET, DELAYED RELEASE (E.C.)    Take 1 Tab (75 mg total) by mouth Twice daily       Follow Up:        your regular physician.       Condition on Disposition: Stable    Supervising physician: Dr. Guinevere Scarlet.

## 2014-11-07 NOTE — ED Nurses Note (Signed)
Discharge instructions reviewed with patient, no questions asked by patient. Patient states understanding of instructions.      Current Discharge Medication List      START taking these medications.       Details    cyclobenzaprine 10 mg Tablet   Commonly known as:  FLEXERIL    10 mg, Oral, 3 TIMES DAILY PRN   Qty:  20 Tab   Refills:  0       diclofenac sodium 75 mg Tablet, Delayed Release (E.C.)   Commonly known as:  VOLTAREN    75 mg, Oral, 2 TIMES DAILY   Qty:  15 Tab   Refills:  0

## 2014-11-19 ENCOUNTER — Emergency Department: Payer: Worker's Comp, Other unspecified

## 2014-11-19 ENCOUNTER — Emergency Department: Payer: Self-pay

## 2014-11-19 ENCOUNTER — Emergency Department
Admission: EM | Admit: 2014-11-19 | Discharge: 2014-11-19 | Disposition: A | Discharge status: Home / Self Care | Payer: Worker's Comp, Other unspecified | Attending: Emergency Medicine | Admitting: Emergency Medicine

## 2014-11-19 DIAGNOSIS — S0181XA Laceration without foreign body of other part of head, initial encounter: Secondary | ICD-10-CM | POA: Insufficient documentation

## 2014-11-19 DIAGNOSIS — W010XXA Fall on same level from slipping, tripping and stumbling without subsequent striking against object, initial encounter: Secondary | ICD-10-CM | POA: Insufficient documentation

## 2014-11-19 DIAGNOSIS — S40012A Contusion of left shoulder, initial encounter: Secondary | ICD-10-CM | POA: Insufficient documentation

## 2014-11-19 DIAGNOSIS — S0990XA Unspecified injury of head, initial encounter: Secondary | ICD-10-CM | POA: Insufficient documentation

## 2014-11-19 MED ORDER — LET SOLUTION
Freq: Once | TOPICAL | Status: AC
Start: 2014-11-19 — End: 2014-11-19
  Administered 2014-11-19: 3 mL via TOPICAL

## 2014-11-19 MED ORDER — ACETAMINOPHEN 325 MG PO TABS
650.0000 mg | ORAL_TABLET | Freq: Once | ORAL | Status: AC
Start: 2014-11-19 — End: 2014-11-19
  Administered 2014-11-19: 650 mg via ORAL

## 2014-11-19 MED ORDER — HYDROCODONE-ACETAMINOPHEN 5-325 MG PO TABS
1.0000 | ORAL_TABLET | Freq: Four times a day (QID) | ORAL | Status: AC | PRN
Start: 2014-11-19 — End: 2014-11-21

## 2014-11-19 MED ORDER — ACETAMINOPHEN 325 MG PO TABS
ORAL_TABLET | ORAL | Status: AC
Start: 2014-11-19 — End: ?
  Filled 2014-11-19: qty 2

## 2014-11-19 MED ORDER — LET SOLUTION
TOPICAL | Status: AC
Start: 2014-11-19 — End: ?
  Filled 2014-11-19: qty 3

## 2014-11-19 MED ORDER — LIDOCAINE-EPINEPHRINE 1 %-1:100000 IJ SOLN
INTRAMUSCULAR | Status: AC
Start: 2014-11-19 — End: ?
  Filled 2014-11-19: qty 20

## 2014-11-19 NOTE — ED Notes (Signed)
Pt states "I fell on a wet floor at work coming from the bathroom.  My left shoulder hurts and my head, I busted it open."

## 2014-11-19 NOTE — ED Notes (Signed)
Pt discharged awaiting registration to finish

## 2014-11-19 NOTE — Discharge Instructions (Signed)
Shoulder Contusion  You have a shoulder injury called a contusion. This causes pain, swelling, and sometimes bruising on the skin. You don't have any broken bones. This injury will take from a few days to 6 weeks to heal, depending on how severe it is. Moderate to severe shoulder contusions are treated with a sling or shoulder immobilizer. Minor contusions can be treated without any special support.  Home care  Follow these tips when caring for yourself at home:   If you were given a sling to use, leave it in place for the time advised by your health care provider. If you aren't sure how long to wear it, ask for advice. If the sling becomes loose, adjust it so that your forearm is level with the ground. Your shoulder should feel well supported.   Put an ice pack on the injured area for 20 minutes every 1 to 2 hours the first day. You can make your own ice pack by putting ice cubes in a plastic bag. Wrap the bag in a thin towel. Continue with ice packs 3 to 4 times a day for the next 2 days. Then use the pack as needed to ease pain and swelling.   You may use acetaminophen or ibuprofen to control pain, unless another pain medicine was prescribed.If you have chronic liver or kidney disease, talk with your health care provider before using these medicines. Also talk with your provider if you've had a stomach ulcer or GI bleeding.   Shoulder joints become stiff if left in a sling for too long. You should start range of motion exercises about 10 days after the injury. Talk with your provider to find out what type of exercises to do and how soon to start.   Unless your provider told you otherwise, you can take the sling off to shower or bathe.  Follow-up care  Follow up with your health care provider if you don't start getting better in the next 5 days.  When to seek medical advice  Call your health care provider right awayif any of these occur:   Pain or swelling gets worseor continues for more than a few  days   Large amount of bruising on your shoulder or upper arm   Your hand or fingers become cold, blue, numb, or tingly   Difficulty moving your hand or fingers   Weakness in your hand or fingers   Your shoulder becomes stiff   Your shoulder feels like it is popping out   You aren't able to do your daily activities     2000-2015 [employer] [address], [place] [address]. All rights reserved. This information is not intended as a substitute for professional medical care. Always follow your healthcare professional's instructions.

## 2014-11-19 NOTE — ED Provider Notes (Signed)
[facility] EMERGENCY DEPARTMENT   History and Physical Exam      Patient Name: [name] [name],[name] [name]  Encounter Date:  11/19/2014  Attending Physician: Marsa Aris, MD  PCP: Rosaura Carpenter, NP  Patient DOB:  January 17, 1961  MRN:  [id number]  Room:  V40/J81-X      History of Presenting Illness     Chief complaint: Facial Laceration    HPI/ROS is limited by: none  HPI/ROS given by: patient    Location: Just above left eye  Duration: just PTA  Severity: moderate          Nursing Notes reviewed and acknowledged.    [name] [name] is a 54 y.o. male who presents with laceration. Pt states he slipped while coming to or from the bathroom and landed on his left side. He is unsure exactly what happened, just stating it happened very fast. He has a laceration right above his left eye and is c/o some left shoulder pain but full ROM. He has a hx of torn bicep tendon in his left shoulder. Pt's tetanus is UTD.      Review of Systems     Review of Systems   Constitutional: Negative for fever, chills, diaphoresis, activity change and appetite change.   HENT: Negative for congestion, ear pain, rhinorrhea, sinus pressure, sneezing, sore throat and trouble swallowing.    Eyes: Negative for pain, discharge, itching and visual disturbance.   Respiratory: Negative for cough, chest tightness, shortness of breath and wheezing.    Cardiovascular: Negative for chest pain and leg swelling.   Gastrointestinal: Negative for nausea, vomiting, abdominal pain, diarrhea and abdominal distention.   Genitourinary: Negative for dysuria, frequency, hematuria, flank pain, decreased urine volume, difficulty urinating and testicular pain.   Musculoskeletal: Positive for arthralgias (left shoulder). Negative for myalgias, back pain and neck pain.   Skin: Positive for wound. Negative for color change, pallor and rash.   Neurological: Negative for dizziness, weakness, light-headedness, numbness and headaches.   Hematological: Does not bruise/bleed easily.    Psychiatric/Behavioral: Negative for suicidal ideas and behavioral problems.   All other systems reviewed and are negative.       Allergies     Pt is allergic to flexeril; naprosyn; and tramadol.    Medications     Current Outpatient Rx   Name  Route  Sig  Dispense  Refill   . naproxen sodium (ANAPROX) 220 MG tablet    Oral    Take 220 mg by mouth 2 (two) times daily with meals.             Marland Kitchen oxyCODONE-acetaminophen (PERCOCET) 10-325 MG per tablet    Oral    Take 1 tablet by mouth every 4 (four) hours as needed for Pain.             Marland Kitchen HYDROcodone-acetaminophen (NORCO) 5-325 MG per tablet    Oral    Take 1 tablet by mouth every 6 (six) hours as needed for Pain.    18 tablet    0     . HYDROcodone-acetaminophen (NORCO) 5-325 MG per tablet    Oral    Take 1 tablet by mouth every 6 (six) hours as needed.    8 tablet    0     . DISCONTD: albuterol (PROVENTIL HFA;VENTOLIN HFA) 108 (90 BASE) MCG/ACT inhaler    Inhalation    Inhale 1-2 puffs into the lungs every 4 (four) hours as needed for Wheezing or Shortness of Breath.  1 Inhaler    0     . DISCONTD: diazepam (VALIUM) 5 MG tablet    Oral    Take 1 tablet (5 mg total) by mouth every 12 (twelve) hours as needed.    10 tablet    0     . DISCONTD: HYDROcodone-acetaminophen (NORCO) 5-325 MG per tablet    Oral    Take 1 tablet by mouth every 8 (eight) hours as needed for Pain.    12 tablet    0     . DISCONTD: ibuprofen (ADVIL,MOTRIN) 200 MG tablet    Oral    Take 200 mg by mouth every 6 (six) hours as needed for Pain.             Marland Kitchen DISCONTD: metaxalone (SKELAXIN) 800 MG tablet    Oral    Take 1 tablet (800 mg total) by mouth 3 (three) times daily.    21 tablet    0     . DISCONTD: oxyCODONE-acetaminophen (PERCOCET) 5-325 MG per tablet        1-2 tablets by mouth every 4-6 hours as needed for pain;  Do not drive or operate machinery while taking this medicine    15 tablet    0          Past Medical History     Pt has a past medical history of Arthritis; Muscle pain;  Tobacco dependence; Chronic shoulder pain, right; and Chronic back pain.    Past Surgical History     Pt has past surgical history that includes TRACHEOSTOMY; Leg Surgery; Fracture surgery; Hernia repair; and Fracture surgery.    Family History     The family history includes Diabetes in his brother.    Social History     Pt reports that he has been smoking.  He does not have any smokeless tobacco history on file. He reports that he does not drink alcohol or use illicit drugs.    Physical Exam     Blood pressure 122/79, pulse 62, temperature 97.9 F (36.6 C), resp. rate 20, height 1.524 m, weight 51.3 kg, SpO2 100 %.    Constitutional: Vital signs reviewed. Well appearing. Well nourished. No acute distress  Head: Normocephalic, 2 CM HORIZONTAL LAC ABOVE LEFT EYE NOT IN EYEBROW.   Eyes: PERRL. EOMI. Conjunctiva and sclera are normal.  No injection or discharge.  Ears, Nose, Throat:  Airway Patent. Normal external examination of the nose and ears. TMs WNL bilaterally. No tonsillar edema, erythema or exudates. Uvula midline.  Neck: Supple. Trachea midline. Normal range of motion. Non-tender.   Back: No deformity, No CVAT, No Spinal or Paraspinous tenderness to palpation.  Extremities: TENDERNESS LEFT SHOULDER W/O DEFORMITY OR CREPITUS. FULL ROM. No cyanosis. No edema. Positive pulses with good capillary refill bilaterally and symmetrically.  Neurological: Alert and conversant.  Normal mental status. Visual fields intact. Face symmetric.  Speech fluid.  No drift.  Strength 5/5 UE and LE bilaterally.  Normal finger-nose-finger.  2+LE DTRs.  Normal FNF.  Skin: Warm, dry and intact. No rash.  Psychiatric: Alert and conversant.  Normal affect.  Normal insight.    Orders Placed     Orders Placed This Encounter   Procedures   . Apply sling   . CT Head WO-Headache (Rad read)   . CT Cervical Spine WO   . XR Shoulder Left   . Suture Tray Set up       Diagnostic Results  The results of the diagnostic studies below have been  reviewed by myself:    Labs  Results     ** No results found for the last 24 hours. **          Radiologic Studies  Radiology Results (24 Hour)     Procedure Component Value Units Date/Time    XR Shoulder Left [[id number]] Collected:  11/19/14 1221    Order Status:  Completed Updated:  11/19/14 1224    Narrative:      Clinical History:  Pain    Examination:  AP internal rotation, AP external rotation and scapular Y views of the left shoulder.    Comparison:  March 23, 2005    Findings:  There is no evidence of fracture or dislocation. The a.c. joint is normal in appearance. Glenohumeral joint appears  unremarkable.      Impression:      Normal left shoulder.    ReadingStation:[id number]    CT Cervical Spine WO [[id number]] Collected:  11/19/14 1155    Order Status:  Completed Updated:  11/19/14 1201    Narrative:      Clinical History:  54 year old with neck pain following fall and head injury.    Examination:  CT cervical spine without contrast November 19, 2014.    Technique:  Noncontrast CT of the cervical spine was performed with contiguous axial images. Multiplanar reformations are provided.    Comparison:  Correlation with radiographs June 20, 2012 and November 27, 2009.    Findings:  There is straightening of the normal cervical lordosis, associated with marked loss of disc height from C3-4 to C6-7.  There is diffuse endplate sclerosis, accompanied by osteophytes and subchondral cysts. There is vacuum phenomenon within  the disc spaces at C4-5 and C5-6, and in the superior endplates at C5 and C7. Vertebral body heights are preserved, with  no evidence of acute fracture or new subluxation. The dens is intact. The craniocervical and cervicothoracic junctions  are intact.    There is ventral CSF effacement, most pronounced at C3-4. There is relative central canal narrowing. There is no  evidence of epidural hematoma. There is a small amount of gas associated with vacuum phenomenon extending into  the  anterior epidural space at C4-5. There is moderate degenerative change of the facet joints, most pronounced on the right  at C2-3. This contributes to significant right foraminal stenosis.    Paravertebral soft tissues are unremarkable. There is no evidence of apical pneumothorax. There is pleural and  parenchymal scarring.      Impression:      Advanced cervical spondylosis without evidence of acute fracture.        ReadingStation:[id number]    CT Head WO-Headache (Rad read) [[id number]] Collected:  11/19/14 1149    Order Status:  Completed Updated:  11/19/14 1156    Narrative:      Clinical History:  54 year old with headache. Technologist states fall with laceration above left eye. Loss of consciousness.    Examination:  CT head without contrast, November 19, 2014.    Technique:  Noncontrast head CT was performed with 2.5 mm axial images. Multiplanar reformations are provided.    Comparison:  March 23, 2005    Findings:  There is ventral soft tissue convexity in the left supraorbital region, which may correspond with reported laceration.  There is a bandage or dressing on the skin surface in this region. There is no soft tissue gas or radiopaque foreign  body. Tiny subcutaneous calcifications  over the frontal sinuses are unchanged since 2006. The globes and orbits are  symmetric and unremarkable. No sinus air-fluid levels are seen. The mastoid air cells are clear. There is no acute skull  fracture, and no sutural diastasis.    There is no acute intracranial hemorrhage, mass effect, or territorial infarct. The sulci, ventricles, and cisterns are  symmetric and unchanged. Midline structures are in normal position. The gray-white matter differentiation is preserved.  The brainstem and posterior fossa are grossly unremarkable.      Impression:      1.  Left supraorbital soft tissue thickening with bandage or dressing in place.  2.  No evidence of acute skull fracture or intracranial hemorrhage.    ReadingStation:[id number]          EKG: none        Medications Given In ED       ED Medication Orders     Start     Status Ordering Provider    11/19/14 1212  acetaminophen (TYLENOL) tablet 650 mg   Once in ED     Route: Oral  Ordered Dose: 650 mg     Last MAR action:  Given Gopal Malter MARIE    11/19/14 1120  LET solution (lidocaine-epi-tetracaine)   Once     Route: Topical     Last MAR action:  Given Bev Drennen MARIE              MDM and Assessment   He was given an arm sling for his left shoulder injury. He was advised on RICE. He will need to have his sutures removed in 5 days. Patient will follow up with their PCP. If symptoms worsen, they will return to the ED. Patient is in agreement with this plan.            Procedures     LACERATION REPAIR   By Marsa Aris, MD  12:22 PM    Location: Just above left eye, not intruding into eyebrow  Length: 2 cm  Layers: single  Contamination: clean  Suture(s): #5 x 6-0 nylon    Verbal consent.  Patient identified and location of wound verified.  Tetanus is up-to-date.  Appropriate sterile precautions observed with Shur-Clens, gloves and sterile drape.  LAT gel and Lidocaine 1% with epi given locally.  Wound and surrounding structures checked and no further trauma found.  Patient tolerated procedure well with no apparent complications.      Diagnosis / Disposition     Clinical Impression  1. Facial laceration, initial encounter    2. Closed head injury, initial encounter    3. Contusion of left shoulder, initial encounter        Disposition  ED Disposition     Discharge [name] discharge to home/self care.    Condition at disposition: Stable            Prescriptions  New Prescriptions    HYDROCODONE-ACETAMINOPHEN (NORCO) 5-325 MG PER TABLET    Take 1 tablet by mouth every 6 (six) hours as needed.       Scribe Attestation statement    I, Marsa Aris, personally performed the services documented. Reinaldo Berber is scribing for me  on [name] [name],[name] [name]. I reviewed and confirm the accuracy of the information in this medical record.           Richardo Hanks, MD  Marsa Aris, MD  11/19/14 [phone]

## 2014-11-19 NOTE — ED Notes (Signed)
Pt left arm placed in sling.  Pt tol well instructed to take arm out of sling to keep moving it several times a day to avoid "frozen shoulder'

## 2014-11-19 NOTE — ED Notes (Signed)
MD to bedside to suture

## 2014-11-25 ENCOUNTER — Emergency Department: Payer: Worker's Comp, Other unspecified

## 2014-11-25 ENCOUNTER — Emergency Department
Admission: EM | Admit: 2014-11-25 | Discharge: 2014-11-25 | Disposition: A | Discharge status: Home / Self Care | Payer: Worker's Comp, Other unspecified | Attending: Emergency Medicine | Admitting: Emergency Medicine

## 2014-11-25 DIAGNOSIS — Z4802 Encounter for removal of sutures: Secondary | ICD-10-CM | POA: Insufficient documentation

## 2014-11-25 MED ORDER — IBUPROFEN 600 MG PO TABS
600.0000 mg | ORAL_TABLET | Freq: Once | ORAL | Status: AC
Start: 2014-11-25 — End: 2014-11-25
  Administered 2014-11-25: 600 mg via ORAL

## 2014-11-25 MED ORDER — IBUPROFEN 600 MG PO TABS
ORAL_TABLET | ORAL | Status: AC
Start: 2014-11-25 — End: ?
  Filled 2014-11-25: qty 1

## 2014-11-25 NOTE — ED Notes (Signed)
Sutures over l eye intact without any sign of infection

## 2014-11-25 NOTE — ED Provider Notes (Signed)
EMERGENCY DEPARTMENT  History and Physical Exam       Patient Name: [name] [name],[name] [name]  Encounter Date:  11/25/2014  Attending Physician: Rosalyn Gess. Grace Isaac, M.D.  PCP: Rosaura Carpenter, NP  Patient DOB:  [birth year]  MRN:  [id number]  Room:  W40/W40-A    Medical Decision Making     Here for suture removal. Patient has no concerns or complications. Wound is healing well. Sutures removed by others. Advised to apply antibiotic ointment twice a day for a day or 2.    In addition to the above history, please see nursing notes. Allergies, meds, past medical, family, social hx, and the results of the diagnostic studies performed have been reviewed by myself.         Diagnosis / Disposition     Clinical Impression  1. Visit for suture removal        Disposition  ED Disposition     Discharge [name] [name] discharge to home/self care.    Condition at disposition: Stable              Follow up for Discharged Patients  Rosaura Carpenter, NP  [address]  [place] [address]  [phone]      As needed. Continue putting ibuprofen on the area as directed.      Prescriptions for Discharged Patients  New Prescriptions    No medications on file                   History of Presenting Illness     Chief complaint: Suture / Staple Removal    HPI/ROS is limited by: none  HPI/ROS given by: patient    [name] [name] is a 54 y.o. male who presents to the ED with complaint of needing stitches removed from on left eyebrow. Patient denies any problems with the area.       Review of Systems   Constitutional: No fever.  No chills.     All other pertinent findings and ROS in HPI.     Allergies & Medications     Pt is allergic to flexeril; naprosyn; and tramadol.    Current/Home Medications    HYDROCODONE-ACETAMINOPHEN (NORCO) 5-325 MG PER TABLET    Take 1 tablet by mouth every 6 (six) hours as needed for Pain.        Past Medical History     Pt   Past Medical History   Diagnosis Date   . Arthritis    . Muscle pain      torn bicep    .  Tobacco dependence    . Chronic shoulder pain, right    . Chronic back pain         Past Surgical History     Pt   Past Surgical History   Procedure Laterality Date   . Tracheostomy     . Leg surgery       left leg surgery as child from CP   . Fracture surgery       hip surgery    . Hernia repair     . Fracture surgery          Family History     The family history includes Diabetes in his brother.     Social History     Pt reports that he has been smoking.  He does not have any smokeless tobacco history on file. He reports that he does not drink alcohol or use illicit drugs.  Physical Exam     Temperature 98.8 F (37.1 C).    Vitals signs reviewed.    Constitutional:  Well-appearing.  Well-nourished. No acute distress.  Skin:  Warm.  Dry.  No pallor.  No rashes.  No lesions.  No bruises. Well healing sutured laceration on left eyebrow.   Neurological:  Alert. Oriented to person,place, time.          Diagnostic Results     The results of the diagnostic studies have been reviewed by myself:    Radiologic Studies  No results found.         Consultation          Procedures / EKG     None      Rosalyn Gess. Grace Isaac, M.D.      ATTESTATIONS      The documentation recorded by my scribe, Charlsie Merles, accurately reflects the services I personally performed and the decisions made by me.  Rosalyn Gess Grace Isaac, M.D.       Tori Milks, MD  11/25/14 [phone]

## 2014-11-25 NOTE — Discharge Instructions (Signed)
Suture Removal(No Complication)  You were seen today for a suture removal. Your wound is healing as expected. It is unlikely that you will have any further problem.  Home Care:   Keep the wound clean and dry. Use a Band-Aid, if needed, to keep the wound from getting dirty for the next week.   Wash the wound carefully with soap and water each day during the next week.   You may shower and bathe as usual. Swimming is now permitted.  Follow Up  for any problems with your own doctor.  Get Prompt Medical Attention  if any of the following occur:   Increasing pain in the wound   Redness, swelling or pus coming from the wound   Fever of 100.4F (38C) or higher, or as directed by your healthcare provider   If the wound edges re-open   2000-2015 [employer] [address], [place] [address]. All rights reserved. This information is not intended as a substitute for professional medical care. Always follow your healthcare professional's instructions.

## 2014-11-26 ENCOUNTER — Emergency Department: Payer: Self-pay

## 2014-11-26 ENCOUNTER — Emergency Department
Admission: EM | Admit: 2014-11-26 | Discharge: 2014-11-26 | Disposition: A | Discharge status: Home / Self Care | Payer: Self-pay | Attending: General Practice | Admitting: General Practice

## 2014-11-26 DIAGNOSIS — W1830XA Fall on same level, unspecified, initial encounter: Secondary | ICD-10-CM | POA: Insufficient documentation

## 2014-11-26 DIAGNOSIS — M545 Low back pain, unspecified: Secondary | ICD-10-CM

## 2014-11-26 DIAGNOSIS — R0781 Pleurodynia: Secondary | ICD-10-CM | POA: Insufficient documentation

## 2014-11-26 MED ORDER — HYDROCODONE-ACETAMINOPHEN 5-325 MG PO TABS
ORAL_TABLET | ORAL | Status: AC
Start: 2014-11-26 — End: ?
  Filled 2014-11-26: qty 3

## 2014-11-26 MED ORDER — HYDROCODONE-ACETAMINOPHEN 5-325 MG PO TABS
ORAL_TABLET | Freq: Once | ORAL | Status: AC
Start: 2014-11-26 — End: 2014-11-26

## 2014-11-26 NOTE — ED Notes (Signed)
States ribs have been hurting all day and right hip hurting all the way to calf and denies any recent injuries

## 2014-11-26 NOTE — Discharge Instructions (Signed)
Neck/Back Pain [General]    Both neck and back pain are usually caused by injury to the muscles or ligaments of the spine. Sometimes the disks that separate each bone of the spine may cause pain by putting pressure on a nearby nerve. Back and neck pain may appear after a sudden twisting/bending force (such as in a car accident), or sometimes after a simple awkward movement. In either case, muscle spasm is often present and adds to the pain.  Acute neck and back pain usually gets better in one to two weeks. Pain related to disk disease, arthritis in the spinal joints or spinal stenosis (narrowing of the spinal canal) can become chronic and last for months or years.  Home Care:   FOR NECK PAIN:Use a comfortable pillow that supports the head and keeps the spine in a neutral position. The position of the head should not be tilted forward or backward.   FOR BACK PAIN: You may need to stay in bed the first few days. But, as soon as possible, begin sitting or walking to avoid problems with prolonged bed rest (muscle weakness, worsening back stiffness and pain, blood clots in the legs).   When in bed, try to find a position of comfort. A firm mattress is best. Try lying flat on your back with pillows under your knees. You can also try lying on your side with your knees bent up towards your chest and a pillow between your knees.   Avoid prolonged sitting. This puts more stress on the lower back than standing or walking.   During the first two days after injury, apply an ICE PACK to the painful area for 20 minutes every 2-4 hours. This will reduce swelling and pain. HEAT (hot shower, hot bath or heating pad) works well for muscle spasm. You can start with ice, then switch to heat after two days. Some patients feel best alternating ice and heat treatments. Use the one method that feels the best to you.   You may use acetaminophen (Tylenol) or ibuprofen (Motrin, Advil) to control pain, unless another pain medicine was  prescribed. [NOTE: If you have chronic liver or kidney disease or ever had a stomach ulcer or GI bleeding, talk with your doctor before using these medicines.]   Be aware of safe lifting methods and do not lift anything over 15 pounds until all the pain is gone.  Follow Up  with your physician or this facility if your symptoms do not start to improve after one week. Physical therapy or further tests may be needed.  [NOTE: If X-rays were taken, they will be reviewed by a radiologist. You will be notified of any new findings that may affect your care.]  Get Prompt Medical Attention  if any of the following occur:   Pain becomes worse or spreads into your arms or legs   Weakness, numbness or pain in one or both arms or legs   Loss of bowel or bladder control   Numbness in the groin area   Difficulty walking   Fever of 100.4F (38C) or higher, or as directed by your healthcare provider   2000-2015 [employer] [address], [place] [address]. All rights reserved. This information is not intended as a substitute for professional medical care. Always follow your healthcare professional's instructions.

## 2014-11-26 NOTE — ED Provider Notes (Signed)
Physician/Midlevel provider first contact with patient: 11/26/14 2044         History     Chief Complaint   Patient presents with   . Leg Pain   . Rib pain     HPI   54 year old male came to emergency room because of right sided back pain and right hip pain. Patient fell 3 weeks ago and landed on his right side. Patient still complaining of pain pain starts from his right side of back and radiated to his head and his leg. No GI or urinary symptoms. Patient has history of cerebral palsy and chronic back problem.  Nursing (triage) note reviewed for the following pertinent information:    Pt fell 3 weeks, on right side, still having pain in right hip radating down legs,  right side rib pain as well; pain is much worse today    Past Medical History   Diagnosis Date   . Arthritis    . Muscle pain      torn bicep    . Tobacco dependence    . Chronic shoulder pain, right    . Chronic back pain        Past Surgical History   Procedure Laterality Date   . Tracheostomy     . Leg surgery       left leg surgery as child from CP   . Fracture surgery       hip surgery    . Hernia repair     . Fracture surgery         Family History   Problem Relation Age of Onset   . Diabetes Brother        Social  History   Substance Use Topics   . Smoking status: Current Every Day Smoker -- 1.00 packs/day   . Smokeless tobacco: Not on file   . Alcohol Use: No       .     Allergies   Allergen Reactions   . Flexeril [Cyclobenzaprine]    . Naprosyn [Naproxen]    . Tramadol        Discharge Medication List as of 11/26/2014 10:22 PM      CONTINUE these medications which have NOT CHANGED    Details   HYDROcodone-acetaminophen (NORCO) 5-325 MG per tablet Take 1 tablet by mouth every 6 (six) hours as needed for Pain., Starting 10/18/2014, Until Discontinued, Print              Review of Systems   Constitutional: Positive for activity change.   Musculoskeletal: Positive for myalgias and back pain.       Physical Exam    BP: 137/76 mmHg, Heart Rate: 80,  Temp: 97.9 F (36.6 C), Resp Rate: 16, SpO2: 100 %, Weight: 52.164 kg    Physical Exam   Constitutional: He is oriented to person, place, and time. He appears well-developed and well-nourished. No distress.   HENT:   Head: Normocephalic.   Right Ear: External ear normal.   Left Ear: External ear normal.   Mouth/Throat: Oropharynx is clear and moist.   Neck: Normal range of motion. Neck supple. No JVD present. No tracheal deviation present. No thyromegaly present.   Cardiovascular: Normal rate, regular rhythm and normal heart sounds.    Pulmonary/Chest: Effort normal and breath sounds normal. No stridor. No respiratory distress. He has no wheezes. He has no rales. He exhibits tenderness.   Tender right side of chest wall. No deformity   Abdominal: Soft.  Bowel sounds are normal. He exhibits no distension and no mass. There is no tenderness. There is rebound. There is no guarding. No hernia.   Musculoskeletal: He exhibits tenderness. He exhibits no edema.   Tender right paravertebral area from L1-S1. No deformity.   Lymphadenopathy:     He has no cervical adenopathy.   Neurological: He is alert and oriented to person, place, and time.   Skin: Skin is warm. No rash noted. He is not diaphoretic. No erythema. No pallor.   Nursing note and vitals reviewed.        MDM and ED Course     ED Medication Orders     Start     Status Ordering Provider    11/26/14 2223  HYDROcodone-acetaminophen (NORCO) 5-325 MG 3 tablet (take home medication)   Once     Route: Oral     Last MAR action:  Meds to Go - ED Use Only Hatim Homann, Jean Rosenthal              MDM    x-ray was reviewed with patient and patient was advised to rest. He was advised to continue with his pain medication.    Procedures    Clinical Impression & Disposition     Clinical Impression  Final diagnoses:   Right-sided low back pain without sciatica   Rib pain on right side        ED Disposition     Discharge [name] discharge to home/self care.    Condition at disposition:  Stable             Discharge Medication List as of 11/26/2014 10:22 PM                      Rhegan Trunnell, Jean Rosenthal, MD  11/28/14 [phone]

## 2014-12-15 ENCOUNTER — Emergency Department
Admission: EM | Admit: 2014-12-15 | Discharge: 2014-12-15 | Disposition: A | Discharge status: Home / Self Care | Payer: Self-pay | Attending: Emergency Medicine | Admitting: Emergency Medicine

## 2014-12-15 ENCOUNTER — Emergency Department: Payer: Self-pay

## 2014-12-15 DIAGNOSIS — N451 Epididymitis: Secondary | ICD-10-CM | POA: Insufficient documentation

## 2014-12-15 DIAGNOSIS — N508 Other specified disorders of male genital organs: Secondary | ICD-10-CM | POA: Insufficient documentation

## 2014-12-15 LAB — VH URINALYSIS WITH MICROSCOPIC AND CULTURE IF INDICATED
Bilirubin, UA: NEGATIVE
Blood, UA: NEGATIVE
Glucose, UA: NEGATIVE mg/dL
Ketones UA: NEGATIVE mg/dL
Leukocyte Esterase, UA: NEGATIVE Leu/uL
Nitrite, UA: NEGATIVE
Protein, UR: NEGATIVE mg/dL
Urine Specific Gravity: 1.013 (ref 1.001–1.040)
Urobilinogen, UA: NORMAL mg/dL
pH, Urine: 7 pH (ref 5.0–8.0)

## 2014-12-15 MED ORDER — CIPROFLOXACIN HCL 500 MG PO TABS
ORAL_TABLET | ORAL | Status: AC
Start: 2014-12-15 — End: ?
  Filled 2014-12-15: qty 1

## 2014-12-15 MED ORDER — CIPROFLOXACIN HCL 500 MG PO TABS
500.0000 mg | ORAL_TABLET | Freq: Two times a day (BID) | ORAL | Status: AC
Start: 2014-12-15 — End: 2014-12-25

## 2014-12-15 MED ORDER — IBUPROFEN 600 MG PO TABS
ORAL_TABLET | ORAL | Status: AC
Start: 2014-12-15 — End: ?
  Filled 2014-12-15: qty 1

## 2014-12-15 MED ORDER — CIPROFLOXACIN HCL 500 MG PO TABS
500.0000 mg | ORAL_TABLET | Freq: Once | ORAL | Status: AC
Start: 2014-12-15 — End: 2014-12-15
  Administered 2014-12-15: 500 mg via ORAL

## 2014-12-15 MED ORDER — IBUPROFEN 600 MG PO TABS
600.0000 mg | ORAL_TABLET | Freq: Once | ORAL | Status: AC
Start: 2014-12-15 — End: 2014-12-15
  Administered 2014-12-15: 600 mg via ORAL

## 2014-12-15 MED ORDER — HYDROCODONE-ACETAMINOPHEN 5-325 MG PO TABS
1.0000 | ORAL_TABLET | Freq: Four times a day (QID) | ORAL | Status: DC | PRN
Start: 2014-12-15 — End: 2015-01-05

## 2014-12-15 NOTE — ED Notes (Signed)
Patient reports constant right testicular pain. States any time he stands, walks, or shifts in his seats he is in severe pain. Denies any itching. States he has been urinating more often, but denies burning sensation. Reports urgency, but states he has had that for years. Reports chills. Denies fever, nausea, vomiting, or diarrhea. Also reports right flank pain. States he normally takes Norco for pain, but has ran out. States he is to have back surgery on the 30th.

## 2014-12-15 NOTE — ED Provider Notes (Addendum)
Physician/Midlevel provider first contact with patient: 12/15/14 2034         History     Chief Complaint   Patient presents with   . Testicle Pain     HPI     Patient here for evaluation of testicular pain. Patient states that for last 2 days she's had right testicle pain. Hurts to move her to palpation. It's in the upper right testicle. No obvious trauma. It was fairly insidious in onset. No obvious swelling or redness. No fevers or chills no difficulty urinating. Never had any trouble before although he states that his father had one of his testicles cut off for no apparent reason.        Past Medical History   Diagnosis Date   . Arthritis    . Muscle pain      torn bicep    . Tobacco dependence    . Chronic shoulder pain, right    . Chronic back pain        Past Surgical History   Procedure Laterality Date   . Tracheostomy     . Leg surgery       left leg surgery as child from CP   . Fracture surgery       hip surgery    . Hernia repair     . Fracture surgery         Family History   Problem Relation Age of Onset   . Diabetes Brother        Social  History   Substance Use Topics   . Smoking status: Current Every Day Smoker -- 1.00 packs/day   . Smokeless tobacco: Not on file   . Alcohol Use: No       .     Allergies   Allergen Reactions   . Flexeril [Cyclobenzaprine]    . Naprosyn [Naproxen]    . Tramadol        Home Medications     Last Medication Reconciliation Action:  In Progress Lanny Cramp, RN 12/15/2014  8:39 PM                                  Review of Systems   Constitutional: Negative for fever and chills.   HENT: Negative for sore throat.    Gastrointestinal: Negative for nausea, vomiting and abdominal pain.   Endocrine: Negative for polyphagia and polyuria.   Genitourinary: Positive for testicular pain. Negative for dysuria, urgency, frequency, hematuria, flank pain, decreased urine volume, discharge, penile swelling, scrotal swelling, difficulty urinating, genital sores and penile pain.    Musculoskeletal: Negative for back pain.   Skin: Negative for color change, rash and wound.   Allergic/Immunologic: Negative for immunocompromised state.   Neurological: Negative for weakness and numbness.   Hematological: Negative for adenopathy. Does not bruise/bleed easily.   Psychiatric/Behavioral: Negative for behavioral problems.       Physical Exam    BP: (!) 125/95 mmHg, Heart Rate: 91, Temp: 98.1 F (36.7 C), Resp Rate: 18, SpO2: 98 %, Weight: 54 kg    Physical Exam   Constitutional: He is oriented to person, place, and time. He appears well-developed and well-nourished. He is active and cooperative. No distress.   HENT:   Head: Normocephalic and atraumatic.   Right Ear: Hearing, tympanic membrane and ear canal normal.   Left Ear: Hearing, tympanic membrane and ear canal normal.   Nose:  Nose normal.   Mouth/Throat: Uvula is midline, oropharynx is clear and moist and mucous membranes are normal.   Eyes: Conjunctivae, EOM and lids are normal. Pupils are equal, round, and reactive to light.   Neck: Trachea normal, normal range of motion, full passive range of motion without pain and phonation normal. Neck supple.   Cardiovascular: Normal rate, regular rhythm, normal heart sounds and intact distal pulses.    No murmur heard.  Pulmonary/Chest: Effort normal and breath sounds normal. He has no wheezes.   Abdominal: Soft. Normal appearance and bowel sounds are normal. He exhibits no distension. There is no tenderness. There is no rebound and no guarding.   Genitourinary: Rectum normal and penis normal. No penile tenderness.         Musculoskeletal: Normal range of motion. He exhibits no edema.   Neurological: He is alert and oriented to person, place, and time. He has normal strength. No cranial nerve deficit.   Skin: Skin is warm, dry and intact. No rash noted.   Psychiatric: He has a normal mood and affect. His behavior is normal.   Nursing note and vitals reviewed.        MDM and ED Course     ED Medication  Orders     Start     Status Ordering Provider    12/15/14 2232  ibuprofen (ADVIL,MOTRIN) tablet 600 mg   Once in ED     Route: Oral  Ordered Dose: 600 mg     Last MAR action:  Given Leshia Kope K    12/15/14 2229  ciprofloxacin (CIPRO) tablet 500 mg   Once in ED     Route: Oral  Ordered Dose: 500 mg     Last MAR action:  Given Evalyse Stroope K         Results for orders placed or performed during the hospital encounter of 12/15/14   Urinalysis w Microscopic and Culture if Indicated   Result Value Ref Range    Color, UA Straw Colorless,Yellow,Straw    Clarity, UA Clear Clear    Specific Gravity, UR 1.013 1.001 - 1.040    pH, Urine 7.0 5.0 - 8.0 pH    Protein, UR Negative Negative mg/dL    Glucose, UA Negative Negative mg/dL    Ketones UA Negative Negative,5 mg/dL    Bilirubin, UA Negative Negative    Blood, UA Negative Negative    Nitrite, UA Negative Negative    Urobilinogen, UA Normal Normal mg/dL    Leukocyte Esterase, UA Negative Negative Leu/uL     Results for orders placed or performed during the hospital encounter of 12/15/14   US SCROTUM AND TESTICLES    Narrative    Ultrasound scrotum and testicles    Indication: Right testicular pain    Technique:  Wallace Cullens scale, color and pulsed Doppler imaging were utilized.    Findings:  Right testicle measures 3.5 x 1.6 x 2.2 cm, and demonstrates normal echotexture without focal masses. Left testicle  measures 3.2 x 2.0 x 2.5 cm, and demonstrates normal echotexture without focal masses. Normal and symmetric flow is  demonstrated in both testicles.     The epididymal heads appear unremarkable and symmetric with normal and symmetric blood flow. No hydrocele is identified.  Left-sided varicocele is present. Scrotal skin appears somewhat thickened, but symmetric bilaterally.        Impression    Impression:     1. No sonographic evidence of testicular torsion or epididymitis/orchitis.  2. Left-sided varicocele.    ReadingStation:[id number]          MDM   NO TORSION, NO ORCHITIS,  NO TUMOR, NO HERNIA, NO UTI, NO PROATATITIS, PROBABLE EPIDIMYTIS, MARRIED, MONOGAMOUS.    Procedures    Clinical Impression & Disposition     Clinical Impression  Final diagnoses:   Epididymitis        ED Disposition     Discharge [name] discharge to home/self care.    Condition at disposition: Stable             Discharge Medication List as of 12/15/2014 10:29 PM                      Wyona Almas, MD  12/15/14 2125    Wyona Almas, MD  12/17/14 2244

## 2014-12-15 NOTE — Discharge Instructions (Signed)
Treating Epididymitis and Orchitis  You have inflammation of the epididymis (epididymitis) and testicle (orchitis). This is likely due to an infection. In many cases, epididymitis and orchitis occur along with urinary tract infections. Treatment includes medication to get rid of the infection. It also includes medication and other methods to relieve symptoms.    Possible treatments   Antibiotics. Acute epididymitis is most often treated with oral antibiotics. You may also be given an injection of antibiotics. Be sure to take all of your medication until it is gone, even if you feel better.   Anti-inflammatories. You may be prescribed medication to reduce swelling and tenderness.   Rest. You will most likely need to rest for 3 to 4 days until swelling and fever are gone. When you are able, lie down with a towel folded under the scrotum to raise it slightly. This can help relieve discomfort.   Scrotal support. If your testicles are swollen, wear an athletic supporter (jockstrap) or spandex shorts. This may help control swelling and ease symptoms.   Ice and heat. To relieve swelling, use an ice pack wrapped in a thin towel on the scrotum. Once swelling is gone, sit in a warm bath to increase blood flow to the area.  After treatment  The inflammation will go away with treatment. But you may have an achy feeling in the testicles for 2 to 4 weeks. This does not mean the infection has come back. The testicles just take time to heal. But if you feel a lump in a testicle after treatment, see your doctor. Once the inflammation is gone, you can be active again.     2000-2015 [employer] [address], [place] [address]. All rights reserved. This information is not intended as a substitute for professional medical care. Always follow your healthcare professional's instructions.

## 2014-12-18 ENCOUNTER — Ambulatory Visit: Payer: Self-pay | Attending: Neurological Surgery

## 2014-12-18 DIAGNOSIS — Z01818 Encounter for other preprocedural examination: Secondary | ICD-10-CM | POA: Insufficient documentation

## 2014-12-18 LAB — BASIC METABOLIC PANEL
Anion Gap: 10.6 mMol/L (ref 7.0–18.0)
BUN / Creatinine Ratio: 18.6 Ratio (ref 10.0–30.0)
BUN: 13 mg/dL (ref 7–22)
CO2: 27.5 mMol/L (ref 20.0–30.0)
Calcium: 9.3 mg/dL (ref 8.5–10.5)
Chloride: 104 mMol/L (ref 98–110)
Creatinine: 0.7 mg/dL — ABNORMAL LOW (ref 0.80–1.30)
EGFR: >60 mL/min/{1.73_m2}
Glucose: 84 mg/dL (ref 70–99)
Osmolality Calc: 275 mOsm/kg (ref 275–300)
Potassium: 4.1 mMol/L (ref 3.5–5.3)
Sodium: 138 mMol/L (ref 136–147)

## 2014-12-18 LAB — ECG 12-LEAD
P Wave Axis: 60 deg
P Wave Duration: 104 ms
P-R Interval: 140 ms
Patient Age: 53 years
Q-T Dispersion: 58 ms
Q-T Interval(Corrected): 379 ms
Q-T Interval: 396 ms
QRS Axis: 30 deg
QRS Duration: 84 ms
T Axis: 60 deg
Ventricular Rate: 55 /min

## 2014-12-18 LAB — PT AND APTT
PT INR: 1.1 (ref 0.5–1.3)
PT: 11.1 s (ref 9.5–11.5)
aPTT: 26.9 s (ref 24.0–34.0)

## 2014-12-18 LAB — URINALYSIS WITH CULTURE REFLEX
Bilirubin, UA: NEGATIVE
Blood, UA: NEGATIVE
Glucose, UA: NEGATIVE mg/dL
Ketones UA: NEGATIVE mg/dL
Leukocyte Esterase, UA: NEGATIVE Leu/uL
Nitrite, UA: NEGATIVE
Protein, UR: NEGATIVE mg/dL
RBC, UA: 2 /hpf (ref 0–4)
Urine Specific Gravity: 1.014 (ref 1.001–1.040)
Urobilinogen, UA: NORMAL mg/dL
WBC, UA: <1 /hpf (ref 0–4)
pH, Urine: 6 pH (ref 5.0–8.0)

## 2014-12-18 LAB — CBC
Hematocrit: 40 % (ref 39.0–52.5)
Hemoglobin: 13.9 gm/dL (ref 13.0–17.5)
MCH: 29 pg (ref 28–35)
MCHC: 35 gm/dL (ref 32–36)
MCV: 82 fL (ref 80–100)
MPV: 9.3 fL (ref 6.0–10.0)
PLT CT: 166 10*3/uL (ref 130–440)
RBC: 4.86 10*6/uL (ref 4.00–5.70)
RDW: 12.6 % (ref 11.0–14.0)
WBC: 3.4 10*3/uL — ABNORMAL LOW (ref 4.0–11.0)

## 2014-12-18 NOTE — Pre-Procedure Instructions (Signed)
STRESS TEST FROM 02/2014 IN [facility].

## 2014-12-18 NOTE — H&P (Signed)
ADMISSION HISTORY AND PHYSICAL EXAM    Date Time: 12/18/2014 1:10 PM  Patient Name: [name] [name],[name] [name]  Attending Physician: Haskel Khan, MD  Primary Care Provider:  Lovena Neighbours, NP        Assessment:   Lumbar spondylolisthesis    Plan:   Patient is scheduled to undergo a L5-S1 PLIF by Dr. Azzie Almas on 01/03/2015.    History of Present Illness:   [name] [name] is a pleasant 54 y.o. male suffering from low back pain and left leg radicular pain for the last 2 years. Pain radiates down to the left calf. Pain worsens as he attempts to walk. He has cerebral palsy and does have frequent falls. He advises the back and leg pain has been worse since his most recent fall. According to Dr. Azzie Almas' note, imaging reveals spondylolisthesis at L5-S1. He has failed conservative treatments such as physical therapy, oral steroids, NSAIDs, and activity modification. He presents today for a preop history and physical in anticipation of having the above-named surgery.    Past Medical History:     Past Medical History   Diagnosis Date   . Arthritis    . Muscle pain      torn bicep    . Tobacco dependence    . Chronic shoulder pain, right    . Chronic back pain    . Low back pain    . Difficulty walking        Past Surgical History:     Past Surgical History   Procedure Laterality Date   . Tracheostomy     . Leg surgery       left leg surgery as child from CP   . Fracture surgery       hip surgery    . Hernia repair     . Fracture surgery         Family History:   Mother- alive at 7 with a history of osteoarthritis.  Father- died at 40 from esophageal cancer.      Social History:   Tobacco- he has smoked half a pack of cigarettes per day for the last 30 years.  ETOH- denies  Illicit Drugs- denies  Married- yes  Children- no  Occupation- prep cook  His wife will be available to help him after surgery.      Allergies:     Allergies   Allergen Reactions   . Flexeril [Cyclobenzaprine]    . Naprosyn [Naproxen]    . Tramadol        Medications:      Current/Home Medications    CHOLECALCIFEROL (VITAMIN D) 1000 UNIT TABLET    Take 1,000 Units by mouth daily.    CIPROFLOXACIN (CIPRO) 500 MG TABLET    Take 1 tablet (500 mg total) by mouth 2 (two) times daily.    HYDROCODONE-ACETAMINOPHEN (NORCO) 5-325 MG PER TABLET    Take 1-2 tablets by mouth every 6 (six) hours as needed for Pain.    OMEGA-3 FATTY ACIDS (OMEGA-3 FISH OIL) 500 MG CAP    Take by mouth.       Review of Systems:   A comprehensive review of systems was:     INFECTIOUS DISEASE:   There is no history of MSSA, MRSA, or VRE infections.      ANESTHESIA REVIEW:    There is no personal or family history of anesthesia problems in the past. Patient can achieve 5-6 METs of activity.    SLEEP APNEA:    There  is no history of sleep apnea, pt does not snore, and denies gasping for breath while asleep.     GENERAL:  There has not been weight gain or loss of >10# in the last six months.   Pt denies fever, chills, or excessive fatigue.    SKIN:  Denies open sores, skin lesions or rashes.  HEENT:  Denies dental pain, bleeding gums, headache, visual changes, dizziness, nosebleeds,  sore throat.  RESPIRATORY:  Denies cough, dyspnea, wheezing or hemoptysis. There have been no PFTs in the last five years.  CARDIOVASCULAR:  Denies chest pain, palpitations or syncope. Stress test from May 2015 was read as normal.  ENDOCRINE:  Denies hx of thyroid disease, diabetes, heat or cold intolerance, polyuria, polydipsia or polyphagia.   GASTROINTESTINAL:  Denies nausea, vomiting, diarrhea, constipation, abdominal pain, dysphagia, gastric reflux, melena or hematochezia.  GENITOURINARY:  Denies dysuria, frequency, hematuria, urgency, nocturia, urinary incontinence, hx of urolithiasis or hx of recurrent urinary tract infections.   HEMATOLOGICAL:  Denies bleeding disorders, blood clots, blood transfusions  MUSCULOSKELETAL:  Positive for low back pain and left leg radicular pain.  NEUROLOGICAL:  Denies seizures, strokes. Positive for  intermittent numbness in the left arm and left leg.  PSYCHOLOGICAL:  Denies depression, anxiety or difficulty with memory.  ONCOLOGICAL:  Denies hx of cancer     Physical Exam:     Patient Vitals for the past 24 hrs:   BP Height Weight   12/18/14 1151 114/72 mmHg 1.6 m (5' 2.99") 52.4 kg (115 lb 8.3 oz)       Body mass index is 20.47 kg/(m^2).        Mental status - alert, oriented to person, place, and time  HEENT - Head is atraumatic and normocephalic, Pupils are PERRLA, EOMs intact. Neck is supple, trachea midline. No JVD, thyromegaly, or carotid bruits. There are a few teeth missing from the lower jaw.  Lymphatics - not examined  Lungs - clear to auscultation bilat, no wheezes, rales or rhonchi.   Heart - normal rate, regular rhythm, normal S1, S2, no murmurs, rubs, clicks or gallops  Abdomen - soft, nontender, nondistended, normoactive bowel sounds.  GU exam deferred  Extremities - 2+ bilat radial pulses, 2+ bilat posterior tibial pulses, without clubbing, cyanosis, or edema. Left leg is shorter than the right.  Neurological - Cranial nerves II-XII intact, strength 5/5 bilat in upper and lower extremities, sensation intact to light touch globally.   Skin -  skin over the lumbar spine is warm, dry, intact and clear of any scratches, excoriations, lesions or rashes.        Signed by:  Paulla Dolly III, CFNP

## 2014-12-20 ENCOUNTER — Ambulatory Visit
Admission: RE | Admit: 2014-12-20 | Discharge: 2014-12-20 | Disposition: A | Discharge status: Home / Self Care | Payer: Self-pay | Source: Ambulatory Visit | Attending: Neurological Surgery | Admitting: Neurological Surgery

## 2014-12-20 DIAGNOSIS — Z87891 Personal history of nicotine dependence: Secondary | ICD-10-CM | POA: Insufficient documentation

## 2014-12-20 DIAGNOSIS — Z01818 Encounter for other preprocedural examination: Secondary | ICD-10-CM | POA: Insufficient documentation

## 2014-12-20 DIAGNOSIS — M4306 Spondylolysis, lumbar region: Secondary | ICD-10-CM | POA: Insufficient documentation

## 2015-01-03 ENCOUNTER — Inpatient Hospital Stay: Payer: Self-pay

## 2015-01-03 ENCOUNTER — Inpatient Hospital Stay: Payer: Self-pay | Admitting: Neurological Surgery

## 2015-01-03 ENCOUNTER — Inpatient Hospital Stay: Payer: Self-pay | Admitting: Anesthesiology

## 2015-01-03 ENCOUNTER — Encounter
Admission: RE | Disposition: A | Discharge status: Home Health | Payer: Self-pay | Source: Home / Self Care | Attending: Neurological Surgery

## 2015-01-03 ENCOUNTER — Inpatient Hospital Stay
Admission: RE | Admit: 2015-01-03 | Discharge: 2015-01-08 | DRG: 460 | Disposition: A | Discharge status: Home Health | Payer: Self-pay | Attending: Neurological Surgery | Admitting: Neurological Surgery

## 2015-01-03 ENCOUNTER — Inpatient Hospital Stay (HOSPITAL_COMMUNITY): Payer: Self-pay | Admitting: Anesthesiology

## 2015-01-03 DIAGNOSIS — M47816 Spondylosis without myelopathy or radiculopathy, lumbar region: Secondary | ICD-10-CM | POA: Diagnosis present

## 2015-01-03 DIAGNOSIS — Z8261 Family history of arthritis: Secondary | ICD-10-CM

## 2015-01-03 DIAGNOSIS — M4317 Spondylolisthesis, lumbosacral region: Secondary | ICD-10-CM

## 2015-01-03 DIAGNOSIS — Z79891 Long term (current) use of opiate analgesic: Secondary | ICD-10-CM

## 2015-01-03 DIAGNOSIS — F17210 Nicotine dependence, cigarettes, uncomplicated: Secondary | ICD-10-CM | POA: Diagnosis present

## 2015-01-03 DIAGNOSIS — G8929 Other chronic pain: Secondary | ICD-10-CM | POA: Diagnosis present

## 2015-01-03 DIAGNOSIS — Z886 Allergy status to analgesic agent status: Secondary | ICD-10-CM

## 2015-01-03 DIAGNOSIS — M1990 Unspecified osteoarthritis, unspecified site: Secondary | ICD-10-CM | POA: Diagnosis present

## 2015-01-03 DIAGNOSIS — M25511 Pain in right shoulder: Secondary | ICD-10-CM | POA: Diagnosis present

## 2015-01-03 DIAGNOSIS — Z800 Family history of malignant neoplasm of digestive organs: Secondary | ICD-10-CM

## 2015-01-03 HISTORY — DX: Cerebral palsy, unspecified: G80.9

## 2015-01-03 HISTORY — DX: Low back pain, unspecified: M54.50

## 2015-01-03 HISTORY — PX: LAMINECTOMY, POSTERIOR LUMBAR, DECOMPRESSION, FUSION LEVEL 1: SHX4458

## 2015-01-03 HISTORY — DX: Difficulty in walking, not elsewhere classified: R26.2

## 2015-01-03 LAB — TYPE AND SCREEN
AB Screen: NEGATIVE
ABO Rh: B POS

## 2015-01-03 SURGERY — LAMINECTOMY, POSTERIOR LUMBAR, DECOMPRESSION, FUSION LEVEL1
Anesthesia: Anesthesia General | Site: Spine Lumbar | Wound class: Clean

## 2015-01-03 MED ORDER — BACITRACIN ZINC 500 UNIT/GM EX OINT
TOPICAL_OINTMENT | CUTANEOUS | Status: DC | PRN
Start: 2015-01-03 — End: 2015-01-03
  Administered 2015-01-03: 1 g via TOPICAL

## 2015-01-03 MED ORDER — SENNOSIDES-DOCUSATE SODIUM 8.6-50 MG PO TABS
2.0000 | ORAL_TABLET | Freq: Every day | ORAL | Status: DC
Start: 2015-01-03 — End: 2015-01-04
  Administered 2015-01-03: 2 via ORAL
  Filled 2015-01-03 (×3): qty 2

## 2015-01-03 MED ORDER — FENTANYL CITRATE 0.05 MG/ML IJ SOLN
INTRAMUSCULAR | Status: DC | PRN
Start: 2015-01-03 — End: 2015-01-03
  Administered 2015-01-03: 25 ug via INTRAVENOUS
  Administered 2015-01-03: 50 ug via INTRAVENOUS
  Administered 2015-01-03: 25 ug via INTRAVENOUS

## 2015-01-03 MED ORDER — SODIUM CHLORIDE 0.9 % IV SOLN
INTRAVENOUS | Status: DC | PRN
Start: 2015-01-03 — End: 2015-01-04

## 2015-01-03 MED ORDER — MEPERIDINE HCL 25 MG/ML IJ SOLN
25.0000 mg | Freq: Once | INTRAMUSCULAR | Status: DC | PRN
Start: 2015-01-03 — End: 2015-01-03

## 2015-01-03 MED ORDER — BACITRACIN ZINC 500 UNIT/GM EX OINT
TOPICAL_OINTMENT | CUTANEOUS | Status: AC
Start: 2015-01-03 — End: ?
  Filled 2015-01-03: qty 28.35

## 2015-01-03 MED ORDER — ACETAMINOPHEN 10 MG/ML IV SOLN
INTRAVENOUS | Status: DC | PRN
Start: 2015-01-03 — End: 2015-01-03
  Administered 2015-01-03: 1000 mg via INTRAVENOUS

## 2015-01-03 MED ORDER — LIDOCAINE HCL (PF) 2 % IJ SOLN
INTRAMUSCULAR | Status: DC | PRN
Start: 2015-01-03 — End: 2015-01-03
  Administered 2015-01-03: 50 mg via INTRAVENOUS

## 2015-01-03 MED ORDER — VANCOMYCIN HCL 1000 MG IV SOLR
INTRAVENOUS | Status: DC | PRN
Start: 2015-01-03 — End: 2015-01-03
  Administered 2015-01-03: 1 g

## 2015-01-03 MED ORDER — ALBUTEROL SULFATE 1.25 MG/3ML IN NEBU
1.2500 mg | INHALATION_SOLUTION | Freq: Once | RESPIRATORY_TRACT | Status: DC
Start: 2015-01-03 — End: 2015-01-03

## 2015-01-03 MED ORDER — NALOXONE HCL 0.4 MG/ML IJ SOLN
0.4000 mg | INTRAMUSCULAR | Status: DC | PRN
Start: 2015-01-03 — End: 2015-01-04

## 2015-01-03 MED ORDER — STERILE WATER FOR INJECTION IJ SOLN
INTRAMUSCULAR | Status: AC
Start: 2015-01-03 — End: ?
  Filled 2015-01-03: qty 50

## 2015-01-03 MED ORDER — HYDROMORPHONE HCL 1 MG/ML IJ SOLN
INTRAMUSCULAR | Status: AC
Start: 2015-01-03 — End: ?
  Filled 2015-01-03: qty 1

## 2015-01-03 MED ORDER — HYDROMORPHONE HCL 1 MG/ML IJ SOLN
INTRAMUSCULAR | Status: DC | PRN
Start: 2015-01-03 — End: 2015-01-03
  Administered 2015-01-03: .1 mg via INTRAVENOUS
  Administered 2015-01-03 (×2): .2 mg via INTRAVENOUS
  Administered 2015-01-03 (×2): .1 mg via INTRAVENOUS

## 2015-01-03 MED ORDER — STERILE WATER FOR INJECTION IJ SOLN
INTRAMUSCULAR | Status: DC | PRN
Start: 2015-01-03 — End: 2015-01-03
  Administered 2015-01-03: 50 mL

## 2015-01-03 MED ORDER — THROMBIN 5000 UNITS EX SOLR
CUTANEOUS | Status: DC | PRN
Start: 2015-01-03 — End: 2015-01-03
  Administered 2015-01-03: 5000 [IU] via TOPICAL

## 2015-01-03 MED ORDER — CEFAZOLIN SODIUM 1 G IJ SOLR
INTRAMUSCULAR | Status: AC
Start: 2015-01-03 — End: ?
  Filled 2015-01-03: qty 2000

## 2015-01-03 MED ORDER — ONDANSETRON HCL 4 MG/2ML IJ SOLN
4.0000 mg | Freq: Once | INTRAMUSCULAR | Status: DC | PRN
Start: 2015-01-03 — End: 2015-01-03

## 2015-01-03 MED ORDER — VH BACITRACIN POLYMIXIN B 50000-1000000 UNITS/1000 ML NS IRRIGATION
Status: AC
Start: 2015-01-03 — End: ?
  Filled 2015-01-03: qty 2000

## 2015-01-03 MED ORDER — LACTATED RINGERS IV SOLN
INTRAVENOUS | Status: DC
Start: 2015-01-03 — End: 2015-01-03

## 2015-01-03 MED ORDER — ACETAMINOPHEN 10 MG/ML IV SOLN
INTRAVENOUS | Status: AC
Start: 2015-01-03 — End: ?
  Filled 2015-01-03: qty 100

## 2015-01-03 MED ORDER — MORPHINE SULFATE 2 MG/ML IJ/IV SOLN (WRAP)
2.0000 mg | Status: DC | PRN
Start: 2015-01-03 — End: 2015-01-03

## 2015-01-03 MED ORDER — MAGNESIUM HYDROXIDE 400 MG/5ML PO SUSP
10.0000 mL | ORAL | Status: DC | PRN
Start: 2015-01-03 — End: 2015-01-08

## 2015-01-03 MED ORDER — VANCOMYCIN HCL 1000 MG IV SOLR
INTRAVENOUS | Status: AC
Start: 2015-01-03 — End: ?
  Filled 2015-01-03: qty 1000

## 2015-01-03 MED ORDER — VH PHENYLEPHRINE INFUSION 20 MG/250 ML (SIMPLE)
INTRAVENOUS | Status: DC | PRN
Start: 2015-01-03 — End: 2015-01-03
  Administered 2015-01-03: 25 ug/min via INTRAVENOUS

## 2015-01-03 MED ORDER — VITAMIN D 1000 UNITS PO TABS
1000.0000 [IU] | ORAL_TABLET | Freq: Every day | ORAL | Status: DC
Start: 2015-01-04 — End: 2015-01-08
  Administered 2015-01-04 – 2015-01-08 (×5): 1000 [IU] via ORAL
  Filled 2015-01-03 (×5): qty 1

## 2015-01-03 MED ORDER — VH HYDROMORPHONE 1 MG/ML BOLUS FROM PCA 50 ML
1.0000 mg | Freq: Once | Status: DC
Start: 2015-01-03 — End: 2015-01-04
  Filled 2015-01-03: qty 1

## 2015-01-03 MED ORDER — CEFAZOLIN SODIUM 1 G IJ SOLR
1.0000 g | Freq: Three times a day (TID) | INTRAMUSCULAR | Status: DC
Start: 2015-01-04 — End: 2015-01-05
  Administered 2015-01-04 – 2015-01-05 (×5): 1 g via INTRAVENOUS
  Filled 2015-01-03 (×2): qty 1000
  Filled 2015-01-03 (×2): qty 100
  Filled 2015-01-03 (×2): qty 1000
  Filled 2015-01-03 (×3): qty 100
  Filled 2015-01-03: qty 1000

## 2015-01-03 MED ORDER — STERILE WATER FOR INJECTION IJ SOLN
INTRAMUSCULAR | Status: AC
Start: 2015-01-03 — End: ?
  Filled 2015-01-03: qty 20

## 2015-01-03 MED ORDER — HYDROCODONE-ACETAMINOPHEN 7.5-325 MG PO TABS
1.0000 | ORAL_TABLET | ORAL | Status: DC | PRN
Start: 2015-01-03 — End: 2015-01-05
  Administered 2015-01-04: 2 via ORAL
  Filled 2015-01-03 (×2): qty 2

## 2015-01-03 MED ORDER — ACETAMINOPHEN 325 MG PO TABS
650.0000 mg | ORAL_TABLET | ORAL | Status: DC | PRN
Start: 2015-01-03 — End: 2015-01-08
  Administered 2015-01-04 – 2015-01-05 (×3): 650 mg via ORAL
  Filled 2015-01-03 (×3): qty 2

## 2015-01-03 MED ORDER — BENZOCAINE-MENTHOL 15-3.6 MG MT LOZG
1.0000 | LOZENGE | OROMUCOSAL | Status: DC | PRN
Start: 2015-01-03 — End: 2015-01-08
  Filled 2015-01-03: qty 18

## 2015-01-03 MED ORDER — DIPHENHYDRAMINE HCL 25 MG PO CAPS
25.0000 mg | ORAL_CAPSULE | Freq: Four times a day (QID) | ORAL | Status: DC | PRN
Start: 2015-01-03 — End: 2015-01-04
  Administered 2015-01-04: 25 mg via ORAL
  Filled 2015-01-03: qty 1

## 2015-01-03 MED ORDER — SODIUM CHLORIDE 0.9 % IJ SOLN
3.0000 mL | Freq: Three times a day (TID) | INTRAMUSCULAR | Status: DC
Start: 2015-01-03 — End: 2015-01-08
  Administered 2015-01-03 – 2015-01-08 (×8): 3 mL via INTRAVENOUS

## 2015-01-03 MED ORDER — MIDAZOLAM HCL 2 MG/2ML IJ SOLN
INTRAMUSCULAR | Status: DC | PRN
Start: 2015-01-03 — End: 2015-01-03
  Administered 2015-01-03: 2 mg via INTRAVENOUS

## 2015-01-03 MED ORDER — DIAZEPAM 5 MG PO TABS
5.0000 mg | ORAL_TABLET | Freq: Three times a day (TID) | ORAL | Status: DC | PRN
Start: 2015-01-03 — End: 2015-01-08
  Administered 2015-01-04 – 2015-01-08 (×6): 5 mg via ORAL
  Filled 2015-01-03 (×7): qty 1

## 2015-01-03 MED ORDER — SODIUM CHLORIDE 0.9 % IV SOLN
INTRAVENOUS | Status: DC
Start: 2015-01-03 — End: 2015-01-05

## 2015-01-03 MED ORDER — PROPOFOL 10 MG/ML IV EMUL
INTRAVENOUS | Status: AC
Start: 2015-01-03 — End: ?
  Filled 2015-01-03: qty 20

## 2015-01-03 MED ORDER — FENTANYL CITRATE 0.05 MG/ML IJ SOLN
INTRAMUSCULAR | Status: AC
Start: 2015-01-03 — End: ?
  Filled 2015-01-03: qty 2

## 2015-01-03 MED ORDER — ONDANSETRON HCL 4 MG/2ML IJ SOLN
4.0000 mg | Freq: Three times a day (TID) | INTRAMUSCULAR | Status: DC | PRN
Start: 2015-01-03 — End: 2015-01-08

## 2015-01-03 MED ORDER — ROCURONIUM BROMIDE 50 MG/5ML IV SOLN
INTRAVENOUS | Status: DC | PRN
Start: 2015-01-03 — End: 2015-01-03
  Administered 2015-01-03: 50 mg via INTRAVENOUS
  Administered 2015-01-03: 10 mg via INTRAVENOUS

## 2015-01-03 MED ORDER — KETAMINE HCL 10 MG/ML IJ SOLN
INTRAMUSCULAR | Status: AC
Start: 2015-01-03 — End: ?
  Filled 2015-01-03: qty 20

## 2015-01-03 MED ORDER — VH BACITRACIN POLYMIXIN B 50000-1000000 UNITS/1000 ML NS IRRIGATION
Status: DC | PRN
Start: 2015-01-03 — End: 2015-01-03
  Administered 2015-01-03 (×2): 1000 mL

## 2015-01-03 MED ORDER — SODIUM CHLORIDE 0.9 % IV SOLN
INTRAVENOUS | Status: DC | PRN
Start: 2015-01-03 — End: 2015-01-03
  Administered 2015-01-03: 50 mL

## 2015-01-03 MED ORDER — VH KETAMINE HCL 200 MG/20 ML IV (NARRATOR)
INTRAMUSCULAR | Status: DC | PRN
Start: 2015-01-03 — End: 2015-01-03
  Administered 2015-01-03 (×2): 10 mg via INTRAVENOUS

## 2015-01-03 MED ORDER — THROMBIN 5000 UNITS EX SOLR
CUTANEOUS | Status: AC
Start: 2015-01-03 — End: ?
  Filled 2015-01-03: qty 5000

## 2015-01-03 MED ORDER — CEFAZOLIN SODIUM-DEXTROSE 2-3 GM-% IV SOLR
2.0000 g | Freq: Once | INTRAVENOUS | Status: AC
Start: 2015-01-03 — End: 2015-01-03
  Administered 2015-01-03: 2 g via INTRAVENOUS
  Administered 2015-01-03: 1 g via INTRAVENOUS
  Filled 2015-01-03: qty 50

## 2015-01-03 MED ORDER — VH HYDROMORPHONE PCA 1 MG/ML 50 ML
INTRAVENOUS | Status: DC
Start: 2015-01-03 — End: 2015-01-04
  Administered 2015-01-03: 50 mg via INTRAVENOUS
  Filled 2015-01-03: qty 50

## 2015-01-03 MED ORDER — PROPOFOL 10 MG/ML IV EMUL
INTRAVENOUS | Status: DC | PRN
Start: 2015-01-03 — End: 2015-01-03
  Administered 2015-01-03: 100 mg via INTRAVENOUS
  Administered 2015-01-03 (×2): 50 mg via INTRAVENOUS

## 2015-01-03 MED ORDER — MIDAZOLAM HCL 2 MG/2ML IJ SOLN
INTRAMUSCULAR | Status: AC
Start: 2015-01-03 — End: ?
  Filled 2015-01-03: qty 2

## 2015-01-03 MED ORDER — ONDANSETRON HCL 4 MG/2ML IJ SOLN
INTRAMUSCULAR | Status: DC | PRN
Start: 2015-01-03 — End: 2015-01-03
  Administered 2015-01-03: 4 mg via INTRAVENOUS

## 2015-01-03 MED ORDER — BISACODYL 10 MG RE SUPP
10.0000 mg | Freq: Every day | RECTAL | Status: DC | PRN
Start: 2015-01-03 — End: 2015-01-08

## 2015-01-03 MED ORDER — NON FORMULARY
Status: DC | PRN
Start: 2015-01-03 — End: 2015-01-03
  Administered 2015-01-03: 2 mL

## 2015-01-03 MED ORDER — FLEET ENEMA 7-19 GM/118ML RE ENEM
1.0000 | ENEMA | Freq: Once | RECTAL | Status: DC | PRN
Start: 2015-01-03 — End: 2015-01-08

## 2015-01-03 MED ORDER — FENTANYL CITRATE 0.05 MG/ML IJ SOLN
25.0000 ug | INTRAMUSCULAR | Status: DC | PRN
Start: 2015-01-03 — End: 2015-01-03
  Administered 2015-01-03: 25 ug via INTRAVENOUS
  Filled 2015-01-03: qty 2

## 2015-01-03 SURGICAL SUPPLY — 46 items
BAG PLASTIC 9X16 10PLY (10/PK) (Supply) ×2 IMPLANT
BRUSH SCRUB (Supply) ×2 IMPLANT
BURR MATCH 3MM 14MH30 (Supply) ×2 IMPLANT
CAGE CNCRD BULLET PAR 9X10X23 ×2 IMPLANT
CANNISTER 1200CC W/LOCK LID (Supply) ×2 IMPLANT
DRAIN BLAKE 7MM FLAT (TDC (Tubes, Draines, Catheters)) ×1
DRAIN BLAKE 7MM FLAT (TDC (Tubes, Drains, Catheters)) ×1 IMPLANT
DRAPE BACK TABLE COVER (Supply) ×2 IMPLANT
DRAPE C-ARM X-RAY (Supply) ×2 IMPLANT
DRAPE C-ARMOUR (Supply) ×2 IMPLANT
DRSG SURGICEL 2 X 14 (Dressings) ×2 IMPLANT
DRSG TELFA ISLAND  2 X 3 (Dressings) ×2 IMPLANT
DRSG TELFA ISLAND 4 X 3 (Dressings) ×2 IMPLANT
FILTER NEPTUNE 4 PORT (Supply) ×2 IMPLANT
GLOVE BIOGEL UND PI IND SZ 8 (Supply) ×2 IMPLANT
GLOVE BIOGEL UT M PI SUR SZ7.5 (Supply) ×2 IMPLANT
GOWN STD LG W/TOWEL REUSE (Supply) ×4 IMPLANT
GOWN WARMING  STANDARD BAIRPAW (Supply) IMPLANT
GOWN XLARGE #2708 (X-PROT) (Supply) ×2 IMPLANT
HEMOSTATIS BONE OSTENE 2G (Supply) ×2 IMPLANT
KIT SURGI FLO (Medication) ×2 IMPLANT
MILL BONE DISP BM200 (Supply) ×2 IMPLANT
PACK NEURO LAMINECTOMY (Supply) ×2 IMPLANT
PAD ARMBOARD 20 X 8 (Supply) ×2 IMPLANT
PAD JACKSON TABLE #5808 (Supply) ×2 IMPLANT
RESERVOIR J-VAC BULB 100CC (TDC (Tubes, Draines, Catheters)) ×1
RESERVOIR J-VAC BULB 100CC (TDC (Tubes, Drains, Catheters)) ×1 IMPLANT
ROD EXPEDIUM 35MM ×4 IMPLANT
SCREW EXPEDIUM  POLYAXIAL 6X40 ×4 IMPLANT
SCREW EXPEDIUM  POLYAXIAL 6X45 ×2 IMPLANT
SCREW EXPEDIUM  POLYAXIAL 6X50 ×2 IMPLANT
SEALER BIPOLAR AQUAMANTYS 2.3 (Supply) ×2 IMPLANT
SET SCREW INNER ×8 IMPLANT
SOL WATER STERILE IRRG 1000ML (Solutions) ×2 IMPLANT
SPONGE GAUZE 4 X 4 RAYTEC (Supply) IMPLANT
SPONGE GAUZE 4 X 4 STERILE (Dressings) ×2 IMPLANT
STAPLER SKIN DISP35WIDE#PXW35 (Supply) ×2 IMPLANT
SUT VCRYL PLUS 2-0 VIO VCP775D (Supply) ×4 IMPLANT
SUT VCRYL PLUS VIO BR VCP718T (Supply) ×6 IMPLANT
SUT VICRYL 1 J718T (Supply) IMPLANT
SUT VICRYL 2-0 J775D (Supply) IMPLANT
TAPE MEDIPORE 4"X2YDS (Dressings) ×2 IMPLANT
TOWEL (FAN-FOLD) 6/PK (Supply) ×6 IMPLANT
TRAY URINEMETER FOLEY LATEXFRE (TDC (Tubes, Draines, Catheters)) ×1
TRAY URINEMETER FOLEY LATEXFRE (TDC (Tubes, Drains, Catheters)) ×1 IMPLANT
VIVIGEN 5CC ×2 IMPLANT

## 2015-01-03 NOTE — Progress Notes (Signed)
Dr. Orvan Falconer in to see patient. Patient is sleeping. Is aware that he states pain  Level is a 7. Okay to go to room with PCA

## 2015-01-03 NOTE — OR Nursing (Signed)
WIFE, MICHELLE, UPDATED ON CASE PROGRESS AND PATIENT STATUS.

## 2015-01-03 NOTE — Anesthesia Preprocedure Evaluation (Signed)
Anesthesia Evaluation    AIRWAY    Mallampati: I    TM distance: >3 FB  Neck ROM: full  Mouth Opening:full   CARDIOVASCULAR    cardiovascular exam normal       DENTAL           PULMONARY    pulmonary exam normal     OTHER FINDINGS                      Anesthesia Plan    ASA 3     general                                 informed consent obtained    ECG reviewed  pertinent labs reviewed

## 2015-01-03 NOTE — Op Note (Signed)
[facility]   Neurosurgery Operative Note    Date Time: 01/03/2015 4:49 PM    Patient Name:   [name] [name]    Date of Operation:   01/03/2015    Providers Performing:   Haskel Khan, MD    Assistant (s):   Dr. Patrick [place]    Note: An assistant was felt to be necessary in order to assist with the handling of multiple highly specialized and technical surgical instruments.  This was done to decrease the operative time, decrease intraoperative blood loss, and decrease operative morbidity.  This also decreased the likelihood of perioperative morbidity and improved the likelihood of successful surgical outcome.      Preoperative Diagnosis:   Pre-Op Diagnosis Codes:     * Spondylolisthesis of lumbosacral region [M43.17]    Postoperative Diagnosis:   Same as Pre-Op Diagnosis Codes:     * Spondylolisthesis of lumbosacral region [M43.17]  Operative Procedure:   Posterior Lumbar Interbody Fusion                      Level:                L5-S1                      Side:                  Bilateral                          CPT# 22633:     Arthrodesis, posterior interbody technique, including                                                  laminectomy and/or discectomy to prepare interspace                                                  (other than for decompression), single interspace;                                                  lumbar                             [address]-60:    Laminectomy, facetectomy and foraminotomy (unilateral                                                  or bilateral with decompression of spinal cord, cauda                                                  equina and/or nerve root(s), (eg, spinal or lateral recess  stenosis)), single vertebral segment; lumbar                                [phone]:        each additional segment, cervical, thoracic, or lumbar                                +22851:   Application of intervertebral  biomechanical device(s)                                                 (eg, synthetic cage(s), threaded bone dowel(s),                                                 methylmethacrylate) to vertebral defect or interspace                                +22840:   Posterior non-segmental instrumentation (eg, Harrington                                                 rod technique, pedicle fixation across one interspace,                                                atlantoaxial transarticular screw fixation, sublaminar                                                wiring at C1, facet screw fixation)                                +11914:  Autograft for spine surgery only (includes harvesting the                                                graft); local (eg, ribs spinous process, or laminar                                                 fragments) obtained from same incision                          Anesthesia:   General    Estimated Blood Loss:   150cc    Implants:     Implant Name Type Inv. Item Serial No.  Manufacturer Lot No. LRB No. Used Action   VIVIGEN 5CC - [id number]  VIVIGEN 5CC  VHLIFENET [id number] N/A 1 Implanted   CAGE CNCRD BULLET PAR [id number] - [id number]  CAGE CNCRD BULLET PAR [id number]  VHJ and J  N/A 1 Implanted   SET SCREW INNER - [id number]  SET SCREW INNER  VHDEPUY  N/A 4 Implanted   SCREW EXPEDIUM  POLYAXIAL 6X40 - [id number]  SCREW EXPEDIUM  POLYAXIAL 6X40  VHDEPUY  N/A 2 Implanted   SCREW EXPEDIUM  POLYAXIAL 6X45 - [id number]  SCREW EXPEDIUM  POLYAXIAL 6X45  VHDEPUY  N/A 1 Implanted   SCREW EXPEDIUM  POLYAXIAL 6X50 - [id number]  SCREW EXPEDIUM  POLYAXIAL 6X50  VHDEPUY  N/A 1 Implanted   ROD EXPEDIUM - [id number]   ROD EXPEDIUM   VHJ and J   N/A 2 Implanted       Complications:   none   Specimen:   none       Clinical History:   See dictated H&P for details.  [name] [name] is a 54 y.o. male with symptomatic lumbar spondylosis and concern for instability.  The differential diagnosis and  treatment options were discussed in great detail. We talked about nonsurgical options and surgical alternatives.  The proposed surgery was described as well as the expected outcomes and potential complications.   He seemed to understand the indications, alternatives, risks, and benefits of the surgical plan and requested to move forward with the operation.     Findings:   Severe foraminal stenosis at L5-1 Left > right.  Spondylolisthesis at L5-1.    Procedure in Detail:     CPT# 25956-38, +75643: Decompressive Laminectomies              Positioning and Exposure:  Following informed consent, the patient was brought to the operative suite.  After induction of general anesthesia, the patient was positioned prone on the [place] table.  All pressure points were appropriately padded.   A "time out" procedure was performed.  A preoperative localizing xray was taken and the surgical level was confirmed.  The patient received preoperative antibiotics.  The back was prepped and draped in standard sterile fashion.  The incision was marked.  The incision was made.  Dissection was carried down to the fascia with monopolar cautery.  Hemostasis was maintained with bipolar cauter.  The fascia was incised and dissection was carried down along the spinous processes and laminae above and below the interspace.   Dissection was carried out lateral to the facets and a self-retaining retractor was placed to maintain exposure.  A second localizing xray was taken to confirm the appropriate level.  Laminectomy, facetectomy, and foraminotomy for decompression x 2:  After confirmation of the level, the spinous processes and laminae of the segment above and the segment below the interspace were removed with Leksell and Kerrison rongeurs.  The ligamentum flavum was released from the bone edges with an  upgoing curette.  The rostral and caudal extent of the ligamentum flavum was exposed and the ligament was removed with a kerrison rongeur  exposing the epidural space.  Central decompression was performed until there was no more mass effect on the thecal sac. The lateral recess and nerve roots were then decompressed on both sides.  A complete facetectomy and foramenotomy was performed bilaterally.  Rongeurs were used to remove the inferior and superior facet processes down to the level of the pedicles.  This resulted in complete posterior decompression of the L5  and S1 nerve roots bilaterally. The bone taken from the decompression was broken into small pieces for autograft.                 CPT# 22633: Arthrodesis, Posterior Interbody Technique                          22851: Application of Intervertebral Synthetic Cage                         20936: Local Autograft  The disc space was then prepared for arthrodesis.  A scalpel was used to incise the annulus.  The disc was removed using a combination of curettage and pituitary rongeur.  Serial shavers were used to remove remaining disc material from the endplates.  A box curette was used to scrape along the endplates until there was no remaining disc material along the palpated surfaces. Serial sizers were used to determine the size of the cage.  The cage was then packed with the local autograft mixed with bone extender.  The interspace was flushed with antibiotic solution. The disc space was prepared and then packed with autograft and bone extender. The cage was then tamped into the interspace and slightly countersunk beyond the posterior vertebral body              CPT# 22840: Posterior Non-Segmental Instrumentation/Pedicle Screws  Pedicle screws were then placed in the vertebral bodies above and below the interspace.  The pedicles were palpated from within the spinal canal with a Woodson probe and the site of entry was chosen on the lateral surface of the superior facets.  The outer cortex was punctured with an awl.  A pedicle finder was then passed through the pedicle to a depth of 40mm.  A ball tip  probe was used to confirm solid bone on all surfaces of the hole.  The hole was then tapped.  After each hole was tapped, the pedicle screws were placed.  A rod was then passed through the polyaxial screw heads and secured with the locking caps.  A torque screwdriver was used for final tightening of the caps.  A final xray was taken to confirm good position of the pedicle screws and graft.  Closure:  The surgical site was irrigated with antibiotic solution.  The self-retaining retractors were removed and meticulous hemostasis was confirmed with bipolar cautery.  A subfascial blake drain was placed and connected to bulb suction.  The fascia was reapproximated with interrupted 0 vicryls.  The subcutaneous tissues were reapproximated with interrupted 2.0 vicryls.  The skin was reapproximated with staples.  A sterile non-stick dressing was applied (telfa).  All sponge, needle, and instrument counts were correct at the end of the case.  The patient was extubated and taken to the PACU in good condition.    Signed by: Haskel Khan, MD                                                                             [facility] MA[facility]

## 2015-01-03 NOTE — OR Nursing (Signed)
ATTEMPTED TO CONTACT FAMILY WITH CASE UPDATE, NO ANSWER IN OR WAITING ROOM OR ON CELL PHONE.

## 2015-01-03 NOTE — Transfer of Care (Signed)
Anesthesia Transfer of Care Note    Patient: [name] [name]    Last vitals:   Filed Vitals:    01/03/15 1712   BP: 130/74   Pulse: 89   Temp: 37 C (98.6 F)   Resp: 18   SpO2: 100%       Oxygen: Non-rebreather Mask     Mental Status:lethargic    Airway: Natural    Cardiovascular Status:  stable

## 2015-01-03 NOTE — OR Nursing (Signed)
WIFE, MICHELLE, UPDATED WITH CASE START AND PATIENT STATUS.

## 2015-01-03 NOTE — Plan of Care (Signed)
Problem: Health Promotion  Goal: Vaccination Screening  All patients will be screened for current vaccination status on each admission.   Outcome: Completed Date Met:  01/03/15  Goal: Risk control - tobacco abuse  Actions to eliminate or reduce tobacco use.   Outcome: Progressing    Problem: Safety  Goal: Patient will be free from injury during hospitalization  Outcome: Progressing    Problem: Pain  Goal: Patient's pain/discomfort is manageable  Outcome: Progressing

## 2015-01-03 NOTE — Progress Notes (Signed)
NEUROSURGERY PACU NOTE    Date/Time: 01/03/2015 5:10 PM  Patient Name: Fleig,Konnor  Length of Stay: Day 0  Post-op Day: 0  For questions page: Shirline Frees NP-C pager # [phone] or Dr. Azzie Almas pager # [phone]    Subjective:   Complaints: Post anesthetic    Vitals:   Temp:  [97.2 F (36.2 C)] 97.2 F (36.2 C)  Heart Rate:  [70] 70  Resp Rate:  [16] 16  BP: (120)/(84) 120/84 mmHg     Medications:     Current Facility-Administered Medications   Medication Dose Route Frequency       Labs:     Results     Procedure Component Value Units Date/Time    Type and screen [[id number]] Collected:  01/03/15 1047    Specimen Information:  Blood Updated:  01/03/15 1220     ABO Rh B Positive      AB Screen NEGATIVE           Radiology:   Fl Fluoro < 1 Hour    01/03/2015   Fluoroscopy provided in the operating room for 3 hour 15 minutes  ReadingStation:[email or web address]    Xr Lumbar Spine Lateral    01/03/2015   Metal probes are pointing towards the pedicles of L4 and L5.  ReadingStation:[email or web address]    Xr Lumbar Spine Lateral    01/03/2015   Impression: Needle marker at the posterior spinous process of L4. ReadingStation:[email or web address]      Exam:   Mental Status: Very drowsy. Follows commands. No acute distress  Motor: MAE  Sensory: Intact globally  Incision: clean/dry/intact    Assessment/Plan:   Doing well, stable condition  Pain control- PCA ordered  Routine post op care  Transfer to floor when stable    Signed by: Fiora Weill A. Janean Eischen NP-C  Neurosurgery

## 2015-01-03 NOTE — Anesthesia Postprocedure Evaluation (Signed)
Anesthesia Post Evaluation    Patient: [name] [name]    Procedures performed: Procedure(s) with comments:  LAMINECTOMY, POSTERIOR LUMBAR, DECOMPRESSION, FUSION LEVEL 1 - L5-S1 PLIF    Anesthesia type: General ETT    Patient location:Phase I PACU    Last vitals:   Filed Vitals:    01/03/15 1745   BP: 107/54   Pulse: 84   Temp:    Resp: 15   SpO2: 100%       Post pain: Continue adjustment of pain medication; will start PCA     Mental Status:awake    Respiratory Function: tolerating nasal cannula    Cardiovascular: stable    Nausea/Vomiting: patient not complaining of nausea or vomiting    Hydration Status: adequate    Post assessment: no apparent anesthetic complications

## 2015-01-03 NOTE — Interval H&P Note (Signed)
I have reviewed the H&P, examined the patient, and there are no significant changes.  Caron Ode MD

## 2015-01-04 MED ORDER — SODIUM CHLORIDE 0.9 % IJ SOLN
0.4000 mg | INTRAMUSCULAR | Status: DC | PRN
Start: 2015-01-04 — End: 2015-01-05

## 2015-01-04 MED ORDER — SODIUM CHLORIDE 0.9 % IV SOLN
INTRAVENOUS | Status: DC | PRN
Start: 2015-01-04 — End: 2015-01-05

## 2015-01-04 MED ORDER — HYDROXYZINE HCL 10 MG PO TABS
10.0000 mg | ORAL_TABLET | Freq: Four times a day (QID) | ORAL | Status: DC | PRN
Start: 2015-01-04 — End: 2015-01-08
  Administered 2015-01-04: 10 mg via ORAL
  Filled 2015-01-04: qty 1

## 2015-01-04 MED ORDER — VH MORPHINE 1 MG/ML BOLUS FROM PCA 50 ML
1.0000 mg | Freq: Once | Status: AC
Start: 2015-01-04 — End: 2015-01-04
  Administered 2015-01-04: 1 mg via INTRAVENOUS
  Filled 2015-01-04: qty 1

## 2015-01-04 MED ORDER — VH MORPHINE 1 MG/ML 50 ML
INTRAVENOUS | Status: DC
Start: 2015-01-04 — End: 2015-01-05
  Administered 2015-01-04: 50 mg via INTRAVENOUS
  Filled 2015-01-04: qty 50

## 2015-01-04 NOTE — Progress Notes (Signed)
Report received; assumed care of patient.  Patient c/o significant itching and inadequate pain control.  Discussed switch to PO pain meds currently ordered.  Will try PO meds with PCA for breakthrough pain and reassess.

## 2015-01-04 NOTE — Progress Notes (Signed)
[facility]               [address], [place] [address]              Case Management / Social Work       [phone]      Estimated D/C Date: 4/2      PCP[facility]Last visit: [facility]      Home assessment/PLOF: Lives with       Financials and Insurance:  NA      Community Services:       DME's/Supplier: cane      Inpatient Plan of Care: Pt admitted for elective surgery, Posterior Lumbar Interbody FusionL5-S1, PT and OT to work with.       CM Interventions: CM met with pt and spouse, Pt will have family support on d/c and will follow up with the free clinic      Transportation:spouse      Barriers to discharge:  pain control      D/C Plan and Needs:  anticipate home with spouse    Reviewed chart notes and discussed d/c plan with MD, bedside RN, therapy and patient and family when available during inpatient course of treatment.      Leonides Grills, RN, BSN  Nurse Case Manager  Neurology, Neurosurgery  Ph [phone]  Fx [phone]

## 2015-01-04 NOTE — Progress Notes (Signed)
Patient is resting quietly at this time, snoring.  SPO2 90% with 2L blowby O2 (patient removed nasal canula).

## 2015-01-04 NOTE — PT Eval Note (Signed)
[facility]: [facility]  Department of Rehabilitation Services: [phone]  [name] [name]    CSN: [id number]    NEURO SURGICAL   [id number]    Physical Therapy Evaluation    Time of treatment:  Time Calculation  PT Received On: 01/04/15  Start Time: 1458  Stop Time: 1526  Time Calculation (min): 28 min    Visit#: 1                                                                                 Precautions and Contraindications:   Spinal Precautions (no bending, lifting or twisting)  Falls    Assessment:      [name] [name] was admitted 01/03/2015 with lumbar spondylolisthesis requiring L5-S1 PLIF. Pt prior to admission was complaining of pain that radiates down into calf. Pt with history of CP. Lives with wife in single level home with 4 STE with HR. Pt works currently and is required to be on feet for 6 hours. Pt was Amb with SPC at community distances. Today on evaluation pt was Min A for rolling to the left and to perform supine to sit. Mod A to perform sit to supine for LE management.  Pt performed sit to stand with FWW and CGAx1. Pt Amb 20' with supervision with FWW with no significant increase in pain.     Patient presenting with the following PT Impairments:decreased ROM, decreased strength, decreased safety/judgement during functional mobility, decreased activity tolerance, decreased functional mobility, decreased balance, gait deficits, pain, decreased sensation    Patient will benefit from skilled PT services in order to maximize function and safety to return patient to PLOF.     Rehabilitation Potential:Good    Discussed risk, benefits and Plan of Care with: Patient    Goals:    Patient to achieve in 3-5 visits:  1) Pt will be Mod I with log rolling for increased time in order to change position. NEW  2) Pt will be Mod I supine to/from sit for increased time in order to prepare for OOB activities. NEW   3) Pt will be Mod I for sit to stand transfers with FWW in order to perform Ambulation  activities. NEW  4) Pt will Amb 100' with FWW and supervision in order to safely navigate the home. NEW  5) Pt will ascend/descend 4 steps with HR and Min A in order to safely enter and exit the home. NEW    Further goals to be formulated based on patient's progress       Plan:   Treatment/interventions: Exercise, Gait training, Stair training, Neuromuscular re-education, Functional transfer training, LE strengthening/ROM, Patient/caregiver training, Bed mobility    Treatment Frequency: 4-5x/wk    DISCHARGE RECOMMENDATIONS   DME recommended for Discharge:   Front wheeled walker    Discharge Recommendations:   Home with home health PT 24 hour supervision needed, 24 hour physical assistance needed    History of Present Illness:     Medical Diagnosis: Spondylolisthesis of lumbosacral region [M43.17]    [name] [name] is a 54 y.o. male admitted on 01/03/2015 with low back pain and left leg radicular pain for the last 2 years. Pain radiates down to  the left calf. Pain worsens as he attempts to walk. He has cerebral palsy and does have frequent falls. He advises the back and leg pain has been worse since his most recent fall. According to Dr. Azzie [name]' note, imaging reveals spondylolisthesis at L5-S1. He has failed conservative treatments such as physical therapy, oral steroids, NSAIDs, and activity modification.     Patient Active Problem List   Diagnosis   . Tobacco dependence   . Knee deformity, congenital   . Acquired spondylolisthesis of lumbosacral region          Past Medical/Surgical History:  Past Medical History   Diagnosis Date   . Arthritis    . Muscle pain      torn bicep    . Tobacco dependence    . Chronic shoulder pain, right    . Chronic back pain    . Low back pain    . Difficulty walking    . Cerebral palsy       Past Surgical History   Procedure Laterality Date   . Tracheostomy     . Leg surgery       left leg surgery as child from CP   . Fracture surgery       hip surgery    . Hernia repair     . 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 Nursing clears patient for therapy.     Patient/caregiver goal for PT: To return home     Pain:  At Rest: 8/10  With Activity: 8/10  Location: Back:  Lower  Interventions: Medication (see eMAR)    Objective:   Patient's medical condition is appropriate for Physical therapy intervention at this time    Observation of patient  Patient is in bed with dressings, continuous pulse oximeter, peripheral IV, O2 at 1.5 liters/minute via nasal cannula, JP Drain,  in place.    Vital Signs:  BP Supine:  115/60 mmHg  HR Supine: 85 bpm  SpO2 at rest: 98% on 1.5 L O2 via NC  SpO2 with activity: 85% on room air, after sitting returned back to 89%, soon after application of O2 returned to 96%    Skin Inspection: Intact, dressing in place over surgical wound   Sensation: diminished, to light touch, on left lower extremity    Cognition:  Oriented to: Oriented x4  Command following: Follows 1 step commands consistently  Alertness/Arousal: Delayed responses to stimuli   Safety Awareness: minimal verbal instruction  Insights: Fully aware of deficits    Musculoskeletal Examination:     Range of motion:  Right LE: Grossly WFL  Left LE: WFL except knee extension limited by 50%       Strength  Right LE: Grossly WFL  Left LE: Grossly [facility]  Balance:   Balance:  Static Sitting:  Fair+  Dynamic Sitting:  Fair mod postural  sway without UE support   Static Standing:  Fair with UE support with walker  Dynamic Standing:  Fair- with UE support with walker, no LOB noted            Functional Mobility:   Bed Mobility:   Rolling to Left:  Minimal assist.   Cues for Log rolling., Cues for Sequencing., Cues for Hand placement.  Supine to Sit:   Minimal assist at trunk to come to up right position .   Cues for Sequencing., Cues for Hand placement.  Sit to Supine:   Moderate assist for LE management.   Cues for Sequencing.  Seated Scooting:   Supervision    Transfers:  Sit to Stand:  CGA  with Front wheeled walker.    Cues for sequencing., Cues for hand placement.  Stand to Sit:  Supervision.    Cues for sequencing., Cues for hand placement., Cues for foot placement    Locomotion:  LEVEL AMBULATION:  Distance: 30'   Assistance level:  CGA   Device:  Front wheeled walker  Pattern:  patient demo step to pattern with flexed knee on left, weightbearing through left toes due to flexed knee. decreased cadance    Participation and Activity Tolerance   Participation effort: Good  Activity Tolerance: Tolerates 10-20 minutes exercise with multiple rests    G-codes:   G-codes applicable (current status: Inpatient): no                     Treatment Interventions this session:   Evaluation  Patient/family/caregiver education spinal precautions     Education Provided:   TOPICS: role of physical therapy, plan of care, goals of therapy and safety with mobility and ADLs, benefits of activity, bed mobility with use of adaptive equipment or strategy, activity with nursing    Learner educated: Patient  Method: Explanation  Response to education: Verbalized understanding    Patient Position at End of Treatment:   Supine, in bed, Needs in reach and No distress O2 in place via NC    Team Communication:     Spoke to : RN  Regarding: Pre-session re: patient status, Patient position at end of session, Patient participation with Therapy, Vital signs, Further  recommendations  Whiteboard updated: No  PT/PTA communication: via written note and verbal communication as needed.      Recommend patient OOB as tolerated and to walk in room with walker and assist from nursing outside of PT sessions.    Jackey Loge, SPT  Alexis Mizuno P Lourie Retz PT

## 2015-01-04 NOTE — Progress Notes (Signed)
Pt has been A&O x4, with left foot flexions slightly weaker than right foot flexions.  Grips equal.  Pt states numbness and tingling in his toes, right foot worse than left.  Pt utilizing the PCA pump appropriately, and states that his pain is well managed with it.  Pt tolerated a box lunch last night without nausea.  Pt c/o itching on his back this morning; prn Benadryl given.  VSS.  Pt currently sleeping comfortably.  Will continue to monitor.

## 2015-01-04 NOTE — Progress Notes (Addendum)
NEUROSURGERY DAILY PROGRESS NOTE    Date/Time: 01/04/2015 8:15 AM  Patient Name: [name] [name],[name] [name]  Length of Stay: Day 1  Post-op Day: 1  Procedure: L5-S1 PLIF  For questions page: Shirline Frees NP-C pager # [phone] or Dr. Azzie Almas pager # [phone]    Subjective:   Complaints: Some residual left leg pain.  Severe back pain and itching.    Vitals:     Filed Vitals:    01/03/15 2323 01/03/15 2343 01/04/15 0430 01/04/15 0731   BP: 100/52  102/65 114/76   Pulse: 86  74 87   Temp: 98.1 F (36.7 C)  100.1 F (37.8 C) 97.8 F (36.6 C)   TempSrc: Oral  Oral Oral   Resp: 18  16 16    Height:       Weight:       SpO2: 100% 98% 100% 98%        Medications:     Current Facility-Administered Medications   Medication Dose Route Frequency   . ceFAZolin  1 g Intravenous Q8H   . HYDROmorphone *PCA*  1 mg Intravenous Once   . senna-docusate  2 tablet Oral Daily   . sodium chloride (PF)  3 mL Intravenous Q8H   . Vitamin D  1,000 Units Oral Daily       Labs:         Invalid input(s): ADIFF, REFLX, CANCL, BAND, ABAND                                    Radiology:   Fl Fluoro < 1 Hour    01/03/2015   Fluoroscopy provided in the operating room for 3 hour 15 minutes  ReadingStation:[email or web address]    Xr Lumbar Spine Lateral    01/03/2015   Metal probes are pointing towards the pedicles of L4 and L5.  ReadingStation:[email or web address]    Xr Lumbar Spine Lateral    01/03/2015   Impression: Needle marker at the posterior spinous process of L4. ReadingStation:[email or web address]      Exam:   Mental Status: Awake and alert. No acute distress  Orientation: Oriented to Name/Place/Year  Cranial Nerves: Not tested  Motor: 5/5 throughout; chronic LLE weakness due to CP  Sensory: Intact globally  Incision: clean/dry/intact; approximated  JP intact. 235cc bloody drainage in the last 8 hours.    Assessment/Plan:   Dilaudid not being tolerating.  Causing pruritis.  Will change to MS PCA and change to Atarax.  Mobilize with PT/OT if feeling better later today  Continue JP drain.  Will plan to d/c  tomorrow.  Post op x-rays when tolerated  Routine post op care  D/C Planning per [facility]    Signed by: Akaysha Cobern A. Shruti Arrey NP-C  Neurosurgery

## 2015-01-04 NOTE — UM Notes (Signed)
[facility] Utilization Management Review Sheet    NAME: [name] [name]  MR#: [id number]    CSN#: [id number]    ROOM: 487/487-A AGE: 54 y.o.    ADMIT DATE AND TIME: 01/03/2015  9:22 AM      PATIENT CLASS:    ATTENDING PHYSICIAN: Haskel Khan, MD  PAYOR:Payor: /       AUTH #:     DIAGNOSIS: Active Problems:    Acquired spondylolisthesis of lumbosacral region          HISTORY:   Past Medical History   Diagnosis Date   . Arthritis    . Muscle pain      torn bicep    . Tobacco dependence    . Chronic shoulder pain, right    . Chronic back pain    . Low back pain    . Difficulty walking    . Cerebral palsy        DATE OF REVIEW: 01/04/2015    VITALS: BP 91/57 mmHg  Pulse 80  Temp(Src) 97.4 F (36.3 C) (Oral)  Resp 16  Ht 1.575 m (5' 2.01")  Wt 50.2 kg (110 lb 10.7 oz)  BMI 20.24 kg/m2  SpO2 97%    Elective admission    Preoperative Diagnosis:   Pre-Op Diagnosis Codes:   * Spondylolisthesis of lumbosacral region [M43.17]    Postoperative Diagnosis:   Same as Pre-Op Diagnosis Codes:   * Spondylolisthesis of lumbosacral region [M43.17]  Operative Procedure:   Posterior Lumbar Interbody Fusion   Level: L5-S1   Side: Bilateral    CPT# 22633: Arthrodesis, posterior interbody technique, including   laminectomy and/or discectomy to prepare interspace   (other than for decompression), single interspace;   lumbar   82956-21: Laminectomy, facetectomy and foraminotomy (unilateral   or bilateral with decompression of spinal cord, cauda   equina and/or nerve root(s), (eg, spinal or lateral recess   stenosis)), single  vertebral segment; lumbar   +30865: each additional segment, cervical, thoracic, or lumbar   +22851: Application of intervertebral biomechanical device(s)    (eg, synthetic cage(s), threaded bone dowel(s),   methylmethacrylate) to vertebral defect or interspace   +22840: Posterior non-segmental instrumentation (eg, Harrington   rod technique, pedicle fixation across one interspace,   atlantoaxial transarticular screw fixation, sublaminar   wiring at C1, facet screw fixation)   +78469: Autograft for spine surgery only (includes harvesting the   graft); local (eg, ribs spinous process, or laminar    fragments) obtained from same incision         Post-op: Neuro floor, VS q 4 hr with continuous pulse ox, O2 2 lpm, I&O q shift, NV checks q 4 hr, drain care, PT/OT consults, Ancef 1 g IV q 8 hr, IV NS @ 75 cc/hr, Morphine PCA IV continuous.        Brent General  Utilization Management  [phone]

## 2015-01-04 NOTE — OT Eval Note (Signed)
[facility]: [facility]  Department of Rehabilitation Services: [phone]    [name]    CSN#: [id number]  NEURO SURGICAL 487/487-A    Occupational Therapy Evaluation    Consult received for [name] [name] for OT Evaluation and Treatment.  Patient's medical condition is appropriate for Occupational therapy intervention at this time.    Time of treatment:   Time Calculation  OT Received On: 01/04/15  Start Time: 1502  Stop Time: 1526  Time Calculation (min): 24 min    Visit#: 01    Precautions and Contraindications:   Spinal Precautions (no bending, lifting or twisting)  Falls    Assessment:      [name] [name] is a 54 y.o. male admitted 01/03/2015 presenting with "low back pain and left leg radicular pain for the last 2 years. Pain radiates down to the left calf. Pain worsens as he attempts to walk. He has cerebral palsy and does have frequent falls. He advises the back and leg pain has been worse since his most recent fall. According to Dr. Azzie Almas' note, imaging reveals spondylolisthesis at L5-S1. He has failed conservative treatments such as physical therapy, oral steroids, NSAIDs, and activity modification." (from Neurology)    Patient underwent L5-S1 posterior lumbar interbody fusion with laminectomy and decompression on 3/30.     Patient's current impairments include: decreased independence with ADLs, decreased independence with IADLs, decreased activity tolerance.     Patient will benefit from skilled OT services for maximizing safety and return of function..    Rehabilitation Potential: Good    Risks/benefits/POC discussed: Patient    Plan:   Treatment/interventions: ADL retraining, functional transfer training, endurance training, equipment eval/education, compensatory technique education    Treatment Frequency: OT Frequency Recommended: 6-7x/wk    Goals:     1.  Patient will be min A for donning/doffing pants, using AT/AE p.r.n.  =  GOAL NEW    2.  Patient will be min A for donning/doffing  socks, using AT/AE p.r.n.  =  GOAL NEW    3.  Patient will be (S) with toilet transfer, using DME p.r.n.  =  GOAL NEW    LTG:    1.  Patient will be (S) - min A for ADLs & functional transfers while maintaining spinal precautions 100% of the time, using AT/AE/DME p.r.n.  =  GOAL NEW       DISCHARGE RECOMMENDATIONS   DME recommended for Discharge:   TBD closer to d/c  Reacher  Sock aid  Long handled shoe horn  Long handled sponge  Shower chair    Discharge Recommendations:   Home with supervision       History of Present Illness:   Medical Diagnosis: Spondylolisthesis of lumbosacral region [M43.17]    [name] [name] is a 54 y.o. male admitted on 01/03/2015 with "low back pain and left leg radicular pain for the last 2 years. Pain radiates down to the left calf. Pain worsens as he attempts to walk. He has cerebral palsy and does have frequent falls. He advises the back and leg pain has been worse since his most recent fall. According to Dr. Azzie Almas' note, imaging reveals spondylolisthesis at L5-S1. He has failed conservative treatments such as physical therapy, oral steroids, NSAIDs, and activity modification." (from Neurology)    Patient underwent L5-S1 posterior lumbar interbody fusion with laminectomy and decompression on 3/30.    Patient Active Problem List   Diagnosis   . Tobacco dependence   . Knee deformity, congenital   .  Acquired spondylolisthesis of lumbosacral region        Past Medical/Surgical History:  Past Medical History   Diagnosis Date   . Arthritis    . Muscle pain      torn bicep    . Tobacco dependence    . Chronic shoulder pain, right    . Chronic back pain    . Low back pain    . Difficulty walking    . Cerebral palsy       Past Surgical History   Procedure Laterality Date   . Tracheostomy     . Leg surgery       left leg surgery as child from CP   . Fracture surgery       hip surgery    . Hernia repair     . Fracture surgery           X-Rays/Tests/Labs:  No significant findings    Social History:    Home Living Arrangements  Living Arrangements: Spouse/significant other  Type of Home: House  Home Layout: Two level, Able to live on main level with bedroom and bathroom, Basement  Bathroom:  standard toilet;  tub/shower unit  DME Currently at Home: Single point cane    Prior Level of Function    Mobility: Community ambulation  Mobility:  Modified independence  with  Single point cane, some of the time -- using cane more frequently in last few weeks  Fall History: had some falls on the ice over the winter but no significant injuries     ADLs   Feeding: Independent  Grooming: Independent  UB dressing: Independent  LB dressing: Minimal assist for socks and shoes  Bathing: Independent  Toileting: Independent    IADLs:   Driving: Independent  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  Patient is agreeable to participation in the therapy session. Nursing clears patient for therapy.     Patient/caregiver goal for OT: not stated    Pain:  At Rest: 8 /10  With Activity: 8/10  Location: Back:  Lower  Interventions: Repositioned, has PCA pump    OBJECTIVE:   Observation of Patient:    Patient is in bed with dressings, peripheral IV, continuous pulse ox, O2 at 1.5 liters/minute via nasal cannula in place.     Vital Signs:   BP Supine:  115/60 mmHg  HR Supine: 88 bpm  SpO2 at rest: 98% on 1.5 L/min  SpO2 with activity: 84% on room air; increased to 90% within 30 seconds with seated rest break and cues for deep breathing; placed back on 1.5 L/min at end of session and increased to 97%     Command following: Follows 1 step commands consistently  Alertness/Arousal: Appropriate responses to stimuli   Attention Span:Appears intact  Safety Awareness: minimal verbal instruction  Insights: Fully aware of deficits  Behavior: Cooperative    Musculoskeletal Examination:   Range of motion:  Right  UE: Grossly WFL  Left UE: Grossly WFL with increased time and effort bilaterally due to increased back pain with arm movement       Strength:  Right Grip Strength: 4+/5  Right Elbow Flexion:  at least 4/5 (deferred further testing due to patient reporting increased back pain)  Left Grip Strength: 4+/5  Left Elbow Flexion:  at least 4/5 (deferred further  testing due to patient reporting increased back pain)         Sensory/Oculomotor Examination:   Auditory:  WFL=intact  Tactile-Light Touch:  WFL=intact  Visual Acuity:  WFL=intact           Activities of Daily Living:   Eating:  Independent;   Grooming: Supervision, assist for set up, seated  UB dressing:  Supervision, assist for set up, seated  LB dressing: Maximal assist, Simulated  Bathing:  Moderate assist, Simulated;  Toileting:  Supervision, Simulated;    Began education on managing ADL tasks while maintaining spinal precautions, but deferred hands-on training today due to patient's pain level while seated edge of bed.    Functional Mobility:   Bed Mobility:  Supine to Sit:   Minimal assist to the left.   Cues for Log rolling., Cues for Sequencing.  Sit to Supine:   Moderate assist.   Cues for Log rolling., Cues for Sequencing.    Transfers:  Sit to Stand:  Minimal assist with Front wheeled walker, and cues for safety  Stand to Sit:  Minimal assist  Functional mobility/ambulation: Contact guard assist for safety with Front wheeled walker    Balance:   Static Sitting:  Good  Dynamic Sitting:  Good  Static Standing:  Fair+ with walker  Dynamic Standing:  Fair+ with walker    Participation and Activity Tolerance   Participation effort: Good  Activity Tolerance: Tolerates 10-20 minutes exercise with multiple rests  G-codes:   G-codes applicable (current status: Inpatient): no   AM-PACT "6 Clicks" Daily Activity Inpatient Short Form  Inpatient AM-PACT Performed?: yes  Put On/Take Off Lower Body Clothing: A lot  Assist with Bathing: A lot  Assist with Toileting: A  little  Put On/Take Off Upper Body Clothing: A little  Assist with Grooming: A little  Assist with Eating: None  OT Daily Activity Raw Score: 17  CMS 0-100% Score: 50.11%     Other Treatment Interventions:   Treatment Activities:   Not applicable          Education Provided:   Topics: Role of occupational therapy, plan of care, goals of therapy and safety with mobility and ADLs, benefits of activity, spine precautions, use of adaptive equipment.    Individuals educated: Patient.  Method: Explanation and Demonstration.  Response to education: Verbalized understanding.    Team Communication:   OT communicated with: RN/LPN - Bjorn Loser; co-eval with PT student Samuel Bouche  OT communicated regarding: Pre-session re: patient status, Patient position at end of session, Patient participation with Therapy  OT/COTA communication: via written note and verbal communication as needed.    Supine, in bed and Needs in reach    Recommend client continue to ambulate with walker and one person assist outside of and in addition to OT session.    Dayton Scrape Jacklynn Dehaas, OTR/L

## 2015-01-05 ENCOUNTER — Encounter: Payer: Self-pay | Admitting: Neurological Surgery

## 2015-01-05 ENCOUNTER — Inpatient Hospital Stay: Payer: Self-pay

## 2015-01-05 MED ORDER — MORPHINE SULFATE 2 MG/ML IJ/IV SOLN (WRAP)
1.0000 mg | Status: DC | PRN
Start: 2015-01-05 — End: 2015-01-08

## 2015-01-05 MED ORDER — DIAZEPAM 5 MG PO TABS
5.0000 mg | ORAL_TABLET | Freq: Three times a day (TID) | ORAL | Status: DC | PRN
Start: 2015-01-05 — End: 2015-01-08

## 2015-01-05 MED ORDER — OXYCODONE-ACETAMINOPHEN 5-325 MG PO TABS
1.0000 | ORAL_TABLET | ORAL | Status: DC | PRN
Start: 2015-01-05 — End: 2015-01-08
  Administered 2015-01-05 – 2015-01-08 (×14): 2 via ORAL
  Filled 2015-01-05 (×14): qty 2

## 2015-01-05 MED ORDER — OXYCODONE-ACETAMINOPHEN 5-325 MG PO TABS
1.0000 | ORAL_TABLET | ORAL | Status: DC | PRN
Start: 2015-01-05 — End: 2015-05-16

## 2015-01-05 NOTE — Plan of Care (Signed)
Problem: Safety  Goal: Patient will be free from injury during hospitalization  Outcome: Progressing    Problem: Pain  Goal: Patient's pain/discomfort is manageable  Outcome: Progressing    Problem: Psychosocial and Spiritual Needs  Goal: Demonstrates ability to cope with hospitalization/illness  Outcome: Progressing

## 2015-01-05 NOTE — Progress Notes (Signed)
[facility]             [address]         [place], [place] [address]              Case Management / Social Work        [phone]     Est d/c date:4/3    Assessment:PT working with pt rec Home Health on d/c. CM sent request to business office for Home health visits.    Discharge plan:Home with spouse and HH with walker    Barriers:    Reviewed chart notes and discussed d/c plan with MD, bedside RN, therapy and patient and family when available during inpatient course of treatment.    Leonides Grills, RN, BSN  Case Manager, Neurology  [phone]

## 2015-01-05 NOTE — Progress Notes (Signed)
NEUROSURGERY DAILY PROGRESS NOTE    Date/Time: 01/05/2015 9:16 AM  Patient Name: [name] [name],[name] [name]  Length of Stay: Day 2  Post-op Day: 2  Procedure: L5-S1 PLIF  For questions page: Shirline Frees NP-C pager # [phone] or Dr. Azzie Almas pager # [phone]    Subjective:   Complaints: No leg pain.  Reports back/incisonal pain, not as severe as yesterday.    Vitals:     Filed Vitals:    01/05/15 0131 01/05/15 0400 01/05/15 0415 01/05/15 0731   BP:  100/66  122/64   Pulse: 83 84  69   Temp: 99.9 F (37.7 C) 98.7 F (37.1 C)  99.4 F (37.4 C)   TempSrc: Oral Oral  Oral   Resp:  16 17 16    Height:       Weight:       SpO2:  92% 100% 99%        Medications:     Current Facility-Administered Medications   Medication Dose Route Frequency   . ceFAZolin  1 g Intravenous Q8H   . sodium chloride (PF)  3 mL Intravenous Q8H   . Vitamin D  1,000 Units Oral Daily       Labs:         Invalid input(s): ADIFF, REFLX, CANCL, BAND, ABAND                                    Radiology:   No results found.    Exam:   Mental Status: Awake and alert. No acute distress  Orientation: Oriented to Name/Place/Year  Cranial Nerves: Not tested  Motor: 5/5 throughout, chronic LLE weakness- at baseline per patient.  Sensory: Intact globally  Incision: clean/dry/intact; approximated  JP intact. 50cc bloody drainage in the last 8 hours.    Assessment/Plan:   Doing much better, stable condition  Continue to mobilize with PT/OT  Temp of 102.3 last night.  Encouraged to use IS!!  Pain control- doing better.  Will change Norco to Percocet and d/c PCA  JP drain removed  Routine post op care  D/C Planning per [facility].  Possible discharge tomorrow but most likely Sunday.    Signed by: Gweneth Dimitri. Elianah Karis NP-C  Neurosurgery

## 2015-01-05 NOTE — Progress Notes (Signed)
Neurosurgery Update Note      Assessment/Plan:   He continues to be in 2L O2.   If he is not able to wean with increased use of IS tomorrow, may consider spiral CT to rule out PE.    Vitals     Filed Vitals:    01/05/15 0731   BP: 122/64   Pulse: 69   Temp: 99.4 F (37.4 C)   Resp: 16   SpO2: 99%       Labs:         Invalid input(s): ADIFF, REFLX, CANCL, BAND, ABAND                                  Microbiology     Microbiology Results     None            Radiology     No results found.      [name] [name] A. Tyniah Kastens NP-C  Neurosurgery

## 2015-01-05 NOTE — OT Progress Note (Signed)
[facility]  [facility]  [address]  [place] [address]  Department of Rehabilitation Services  [phone]  Amy Belloso CSN#:  [id number]  NEURO SURGICAL 487/487-A    Occupational Therapy Treatment Note     Time of treatment: Time Calculation  OT Received On: 01/05/15  Start Time: 1020  Stop Time: 1100  Time Calculation (min): 40 min    OT Visit Number: 2    Medical Diagnosis/Pertinent medical/surgical details: L5-S1 PLIF  Date of Operation:    01/03/2015      Providers Performing:    Haskel Khan, MD      Assistant (s):    Dr. Patrick [place]    Note: An assistant was felt to be necessary in order to assist with the handling of multiple highly specialized and technical surgical instruments.  This was done to decrease the operative time, decrease intraoperative blood loss, and decrease operative morbidity.  This also decreased the likelihood of perioperative morbidity and improved the likelihood of successful surgical outcome.        Preoperative Diagnosis:    Pre-Op Diagnosis Codes:     * Spondylolisthesis of lumbosacral region [M43.17]      Postoperative Diagnosis:    Same as Pre-Op Diagnosis Codes:     * Spondylolisthesis of lumbosacral region [M43.17]  Operative Procedure:    Posterior Lumbar Interbody Fusion                      Level:                L5-S1                      Side:                  Bilateral                              Procedures performed: Procedure(s) with comments:  LAMINECTOMY, POSTERIOR LUMBAR, DECOMPRESSION, FUSION LEVEL 1 - L5-S1 PLIF    Precautions and Contraindications:    Precautions  Spinal Precautions: no bending;no twisting;no lifting  Other Precautions:  (falls, spinal precautions)    Assessment:   [name] [name] is a 54 y.o. male admitted 01/03/2015 presenting with "low back pain and left leg radicular pain for the last 2 years. Pain radiates down to the left calf. Pain worsens as he attempts to walk. He has cerebral palsy and does have  frequent falls. He advises the back and leg pain has been worse since his most recent fall.  Pt s/p L5-S1 PLIF.  Patient participated with therapy today.  OT provided education re: spinal precautions with ADLs (LB dressing using AD) and transfers using walker.  Handout provided to patient with spinal precautions and dressing techniques. Pt will benefit from continued skilled OT to maximize i'ence with ADLs following spinal precautions.         Plan:        Treatment Interventions: ADL retraining;Functional transfer training;Patient/Family training;Equipment eval/education     OT Frequency Recommended: 6-7x/wk    Goals:    1.  Patient will be min A for donning/doffing pants, using AT/AE p.r.n.  = MET    2.  Patient will be min A for donning/doffing socks, using AT/AE p.r.n.  =  ongoing    3.  Patient will be (S) with toilet transfer, using DME p.r.n.  =  ongoing    LTG:    1.  Patient will be (S) - min A for ADLs & functional transfers while maintaining spinal precautions 100% of the time, using AT/AE/DME p.r.n.  = ongoing    DISCHARGE RECOMMENDATIONS        DME Recommended for Discharge: Reacher;Sock aid;Grab bars (shower chair/transfer tub bench, grab bars for toilet)    Subjective:    "I am feeling better today."  Pt stated   .    Patient's medical condition is appropriate for Occupational Therapy intervention at this time.  Patient is agreeable to participation in the therapy session.    Pain:  Pain Assessment  Pain Assessment: Numeric Scale (0-10)  Pain Score: 6-moderate pain           Objective:     Observation of Patient/Vital Signs:  Patient is in bed with IV, O2 2 L in place.    Vital Signs: O2 2 L 100%, RA 94-95%- left off with RN to monitor  BP Supine  mmHg   BP Sitting  mmHg   BP Standing  mmHg   BP c activity  mmHg   HR Supine  bpm   HR Sitting  bpm   HR standing  bpm   HR c activity  bpm   SpO2 at rest     SpO2 c activity        Cognition/Neuro Status  Arousal/Alertness: Appropriate responses to  stimuli  Attention Span: Appears intact  Orientation Level: Oriented X4  Memory: Appears intact  Following Commands: Follows all commands and directions without difficulty  Insights: Educated in Information systems manager Mobility  Supine to Sit Transfers: Minimal Assist  Sit to Supine Transfers: Moderate Assist  Sit to Stand Transfers: Minimal Assist (with front wheeled walker), slow with movement. Pt did walk at least 6 feet with front wheeled walker in room with min A   Stand to Sit Transfers: Supervision (with front wheeled walker)  Bed to Bedside Commode Transfers: Minimal Assist (with front wheeled walker)  Bed to Chair Transfers: Minimal Assist (with front wheeled walker)      Self-care and Home Management  Eating: Independent;Setup  Grooming: Independent;setup  Bathing: Moderate Assist  LB Dressing:  Patient was mod A c donning/doffing socks using reacher and sock aide. Pt was min A c donning pants over feet using reacher.  S/min to stand and arrange clothing using walker.  Educated re: spinal precautions and handout provided.  Toileting: Moderate Assist/min A  Functional Transfers: Minimal Assist (with front wheeled walker, transfers )    Treatment Activities:    "X" All That Apply    Self Care/ADLs x   Therapeutic Activity x   Therapeutic Exercise    Cognitive Development    Neuromuscular Re-education    Brace Fitting    Pt/Family Education x   Other (please specify)       Educated the patient to role of occupational therapy, plan of care, goals of therapy and safety with mobility and ADLs, spine precautions.    Individuals Educated :  (X in all that apply)           Patient x           Spouse/Significant Other            Family            Caregiver            Nursing Staff  Other (specify)    Response:             Verbalized Understanding x           Demonstrated Understanding            Questionable Understanding            No Understanding            Refused Education      Position at end  of session: Client left in supine with call bell in reach.  Bed alarm on    OT Team Communication: (please place "x" in all that apply)  Spoke c RN/CNA x   Spoke c RNCM/SW    Spoke c MD/NP/PA    Re: Pt position    Re: D/C needs    Re: Pt participation with Therapy x   Re: Vital Signs    Re: Further Recommendations     White board updated  x       RN notified of session outcome.  Recommend client to be up with front wheeled walker outside of and in addition to OT session. CM obtained walker for patient.       Donell Sievert OTR/L

## 2015-01-05 NOTE — Discharge Instructions (Signed)
[facility]  Postoperative Instructions following Back/Neck Surgery  Blanchard Mane MD    Your follow up appointment is scheduled with Dr. Azzie Almas on 02/15/15 at 2 pm.  Please have your post op x-rays complete prior to your appointment.       EXPECTATIONS:  After surgery, you can expect to feel a little weak and tired, but you should become stronger every day. It is normal to feel some discomfort around your incision, as well as in your (arms/legs). There may be some residual numbness and weakness after surgery, this may take months to go away, and may not ever completely go away. Call us if the symptoms suddenly worsen after surgery.     ACTIVITY:  No bending or lifting anything greater than about 10-15 lbs until you are seen in the office at your follow up appointment. It is very important to get up and moving at home, but it is equally important to not overdo activities. Plan to have some assistance at home for the first few days. You will be able to walk, take stairs (carefully and slowly), use the bathroom, and get up from sitting to standing using the arms of the chair as support.   You can start walking the day you get home from surgery, and slowly increase your activity back to baseline. For example, increase from a short, less than 5 minute walk the first few days, to around the block and up to 15-20 minutes after a couple weeks. Do not bend, twist or lean to pick things up or change positions. Remember good back mechanics and use your legs to sit down and stand up.    DIET:  You may resume your home diet as tolerated.    DRESSING CARE:  If you have a dressing over your incision, you may remove the dressing 2 days after surgery and leave it uncovered. If your surgery was on a Tuesday, then on Thursday evening you can remove the dressing.  You will need someone to help you remove the dressing.     INCISION CARE:  There are several ways that an incision can be closed in the operating room.    We will let you know before you are discharged how your incision was closed.  Depending on how it was closed will determine when you shower, etc.    Dermabond-Sometimes stitches are placed under the skin.  These will dissolve and will not need to be removed.  In this case, a water proof liquid bandage, called DermaBond,  is applied on the skin and allowed to dry.  It looks like superglue.  This does not need to be removed but will simply wear off over the first couple weeks after surgery.  Because it is waterproof, you can shower immediately after surgery.  However, do not soak in a tub.  Simply take a luke warm shower and allow the water to run over the DermaBond.  You can pat this area dry.    Staples-In this instance, metal staples are used to close the skin.  It will be very obvious to you if you have staples.  When staples are used, a cloth dressing is applied over the incision.  You may remove the dressing 2 days after the surgery and leave it uncovered.  If your surgery was on a Tuesday, then on Thursday evening you can remove the dressing.  You will need someone to help you remove the dressing.    You may begin  showering on the third day after surgery. (If your surgery was on a Tuesday, you may begin showering on Friday)  You will need to cover your incision with a dressing when you shower.  The Nurses at the hospital will show you how to cover it for showering before you leave the hospital.  However, it is fairly simple.  Just apply a dressing before going into the shower and remove it when you get out.  Continue this process until the staples are removed.    You may remove your brace or collar (if you have one) while in the shower, but avoid bending, flexing or leaning over. You may need assistance.     STAPLE REMOVAL:  You will need to return to the office in 7-14 days for their removal.  You do not need an appointment for this.  Simply show up at the office between 1:00 and 3:00 and tell the receptionist  that you are here for staple removal.  The nurses will inspect your incision and remove the staples.  If you were given a staple remover by the hospital at discharge, please remember to bring it with you.      If you notice redness, swelling or drainage around your incision, or develop a fever >101.15F, please call the office immediately [phone] for instructions.    MEDICATION:  You will receive a prescription for pain medication and a muscle relaxant. You may have pain after the surgery but should not need to take narcotic pain medication for more than two - four weeks. Keep in mind there is a narcotic component to the pain medication and you should avoid driving or operating any vehicle after taking the medication. It can cause drowsiness as well as constipation.  You may need to start an over the counter  stool softener to avoid any complications. If you use all of your pain medication and desire a refill, call so that we can evaluate your situation. You may take Tylenol (acetaminophen) for pain, but keep in mind there is acetaminophen in your narcotic pain medication, so do not take both.     Racheal or Dr. Michiel Sites will manage your prescription pain medication for no more than 3 months.  You will be notified if this is your last script that will be provided.  If you are still having pain requiring narcotics at your follow up, you may be referred to pain management for long term care.      You may resume your home medications as indicated on your discharge form. You should NOT resume your blood thinning medication such as coumadin, heparin, aggrenox, plavix or pradaxa until instructed to do so by your surgeon.     If you have had a fusion you should not take any ibuprofen, naproxen, Aleve, Motrin etc. until directed by your surgical team.    PHYSICAL THERAPY:  We will discuss PT at your first follow up visit. You will not begin therapy until after your first follow up appointment.    SEXUAL ACTIVITY:  Sexual  activity is not advised until you have followed up with [place] in the clinic. The jarring mechanism should be avoided for a short time post operatively. After 10 days- 2 weeks you may resume activity with your back/neck supported by the bed/surface.    DRIVING:  You may resume driving after 2 weeks. For neck surgery, if you were placed in a collar you need to wait until you have followed up with the clinic and  are out of the cervical collar. If you were not placed in a collar, then you may resume driving when you are off of narcotic pain medication.    WORK:  Do not return to work until you have been advised to do so by staff. Generally we recommend taking 4-6 weeks off of work for recovery, but each individual situation will be reviewed independently.    HOUSEWORK:  Avoid vacuuming or laundry until you have followed up in the clinic. The bending and lifting motion can place pressure on your back and neck    PROBLEMS or CONCERNS:  Please feel free to call the office at any time for problems, concerns, or questions you may have. We are always available to help you or answer a question. In an emergency, please call 911 or go to the nearest Emergency Department.      [facility]  [address]  [place], [place] [address]  [phone]    Claudie Fisherman (secretary)  Shirline Frees NP (nurse practitioner)  [facility] [phone] will call for first visit

## 2015-01-05 NOTE — Progress Notes (Signed)
Uneventful shift.  Patient has walked several and is doing well Anticipate discharge to home tomorrow or Sunday. Wife at bedside.

## 2015-01-05 NOTE — Progress Notes (Signed)
Pt's temperature was 102.3 at start of the shift.  Medicated with prn Tylenol twice, and temperature is 98.7 orally this morning.  Neuro assessment remains unchanged- pt with left foot/leg weakness and numbness/tingling in his toes.  Pt encouraged to use his incentive spirometer while awake, and he has been doing so appropriately.  States his back pain is always a 6/7, but is always asleep when entering the room.  Voiding without difficulty.  Denies any itching or other discomforts.  Encouraged pt to transition off of the PCA pump and attempt to use oral narcotics for pain relief.  Pt states that he would like to do that today.  Bed alarm on.  Will continue to monitor.

## 2015-01-05 NOTE — Plan of Care (Signed)
Problem: Safety  Goal: Patient will be free from injury during hospitalization  Outcome: Progressing    Problem: Pain  Goal: Patient's pain/discomfort is manageable  Outcome: Progressing    Problem: Moderate/High Fall Risk Score >5  Goal: Patient will remain free of falls  Outcome: Progressing

## 2015-01-05 NOTE — PT Progress Note (Signed)
PT Progress Note  VHS: [facility]  Department of Rehabilitation Services: [phone]  Elman Dettman    CSN: [id number]    NEURO SURGICAL   487/487-A    Physical Therapy Treatment Note    Time of treatment:   Time Calculation  PT Received On: 01/05/15  Start Time: 1405  Stop Time: 1437  Time Calculation (min): 32 min    Visit#: 2    Last seen by Physical therapist vs. PTA: 01/05/15    Medical Diagnosis/Pertinent medical/surgical details: lumbar spondylolisthesis requiring L5-S1 PLIF 01/03/15, pt with cerebral palsy    Precautions and Contraindications:  Spinal Precautions (no bending, lifting or twisting)  Falls    Assessment:   Patient's progress towards established goals: excellent progress today, increasing gait distance from  30' to 200' with walker, and was able to demo mod I supine to sit transfer.  Pain is less today.    Patient continues to have the following impairments: impaired motor control , decreased functional mobility, decreased balance, gait deficits, pain    Patient will continue to benefit from skilled PT services in order to maximize function and safety to return patient to PLOF       Goals:   Patient to achieve in 3-5 visits:  1) Pt will be Mod I with log rolling for increased time in order to change position. MET  2) Pt will be Mod I supine to/from sit for increased time in order to prepare for OOB activities. ONGOING (Met for supine to sit)  3) Pt will be Mod I for sit to stand transfers with FWW in order to perform Ambulation activities. ONGOING  4) Pt will Amb 100' with FWW and supervision in order to safely navigate the home. MET  5) Pt will ascend/descend 4 steps with HR and Min A in order to safely enter and exit the home. ONGOING    Further goals to be formulated based on patient's progress      Plan:   Treatment/interventions: Exercise, Gait training, Stair training, Functional transfer training, Patient/caregiver training, Bed mobility    Treatment Frequency:  7x/wk    DISCHARGE RECOMMENDATIONS   DME recommended for Discharge:   Front wheeled walker    Discharge Recommendations:   Home with supervision       Subjective:   Pt says he feels better, denied fatigue while walking, asking to walk further.   Patient is agreeable to participation in the therapy session. Nursing clears patient for therapy.     Pain:  At Rest: 7 /10  With Activity: 7/10  Location: Back:  Lower  Interventions: Medication (see eMAR)    OBJECTIVE:   Observation of Patient/Vital Signs:   Patient is in bed with no medical equipment in place.  Patient's medical condition is appropriate for Physical therapy intervention at this time.    Vital Signs:  HR Sitting:  104 bpm  HR after activity: 106 bpm  SpO2 at rest: 94%  SpO2 with activity: 94%  on room air    Oriented to: Oriented x4  Command following: Follows ALL commands and directions without difficulty  Insights: Fully aware of deficits    Musculoskeletal and Balance Details:   I sitting balance EOB;  Good standing balance with walker         Functional Mobility:   Bed Mobility:   Rolling to Left:  Modified independence .   using rail, no cues needed  Supine to Sit:   Modified independence .  using rail, cues for sequencing  Seated Scooting:   Modified independence with extra time    Transfers:  Sit to Stand:  Supervision with Front wheeled walker.    pt places B hands on walker, asked pt to try to push up from bed with one hand, pt feels more comfortable putting B hands on walker and did not pull on it  Stand to Sit:  Supervision.    onto bed    Locomotion:  Pt walked with FWW 200' with supervision, PT lowered walker 1" to better fit patient;  Only needed cuing initially to avoid flexed posture, pt then walked with good posture, L heel not reaching floor but this is normal for patient when not wearing built-up shoe on L.  Gait speed increased with distance.    Participation and Activity Tolerance   Participation effort: Excellent  Activity Tolerance:  Activity tolerance does not limit participation    Other Treatment Interventions this session:   Therapeutic activity  Gait training  Patient/family/caregiver education     Education Provided:   TOPICS: role of physical therapy, plan of care, goals of therapy and safety with mobility and ADLs, benefits of activity, spine precautions, activity with nursing, how to use IS properly    Learner educated: Patient, Spouse/Significant other  Method: Explanation  Response to education: Verbalized understanding    Patient Position at End of Treatment:   Sitting, at the edge of the bed, Family/visitors present, Needs in reach and No distress    Team Communication:   Spoke to : RN/LPN - Alice  Regarding: Pre-session re: patient status, Patient participation with Therapy  Whiteboard updated: Yes  PT/PTA communication: via written note and verbal communication as needed.      Recommend patient walk in hall with family and/or nursing supervision, sit in chair for 20 minute intervals for meals, outside of PT sessions.    Barrington Ellison Bernardine Langworthy, PT

## 2015-01-05 NOTE — Plan of Care (Signed)
Problem: Safety  Goal: Patient will be free from injury during hospitalization  Outcome: Progressing  Fall precautions in effect. Hourly rounding on pt.     Problem: Pain  Goal: Patient's pain/discomfort is manageable  Outcome: Progressing    Problem: Moderate/High Fall Risk Score >5  Goal: Patient will remain free of falls  Outcome: Progressing  Fall precautions in effect. Hourly rounding on pt.

## 2015-01-06 NOTE — Progress Notes (Addendum)
NEUROSURGERY DAILY PROGRESS NOTE    Date/Time: 01/06/2015 12:41 PM  Patient Name: [name] [name],[name] [name]  Length of Stay: Day 3  Post-op Day: 3  Procedure: L5S1 PLIF  For questions page: Anastasia Pall NP-C pager # [phone]  or Dr.Marticia Reifschneider pager # [phone]    Subjective:   Complaints: Denies headache, nausea, abd discomfort, leg pain, numbness, tingling or weakness. Reports back and incisional pain.    Vitals:     Filed Vitals:    01/05/15 2340 01/06/15 0036 01/06/15 0532 01/06/15 0759   BP:   98/61 101/54   Pulse:   79 80   Temp:  99 F (37.2 C) 99 F (37.2 C) 98.8 F (37.1 C)   TempSrc:   Oral Oral   Resp:    16   Height:       Weight:       SpO2: 100%  99% 99%        Medications:     Current Facility-Administered Medications   Medication Dose Route Frequency   . sodium chloride (PF)  3 mL Intravenous Q8H   . Vitamin D  1,000 Units Oral Daily       Labs:         Invalid input(s): ADIFF, REFLX, CANCL, BAND, ABAND                                    Radiology:   Xr Lumbar Spine Ap And Lateral    01/05/2015    1. Patient status post recent L5-S1 posterior decompression and fusion without immediate complicating feature. ReadingStation:[id number]      Exam:   Mental Status: Awake and alert. No acute distress  Orientation: Oriented to Name/Place/Year  Cranial Nerves: CN 2-12 grossly intact  Motor: 5/5 throughout except left leg which is old  Sensory: Intact globally  Abd: Slightly rounded, BS all quadrants, soft & nontender  Incision: clean/dry/intact; approximated      Assessment/Plan:   Stable condition; continues with low temp; lungs clear; O2 sat 99 on rm air  Pain control  Ambulating independently with walker  Routine post op care  D/C Planning    Signed by: [name] Mclean Catlett, NP-C                      Neurosurgery  Patient doing reasonably well with improvement in back and leg pain, ambulating with PT. Continue current treatment.    Discharge planning    [name] Fearing B. Jazzlene Huot, MD  Division of Neurosurgery  [facility]

## 2015-01-06 NOTE — Plan of Care (Signed)
Problem: Safety  Goal: Patient will be free from injury during hospitalization  Outcome: Progressing

## 2015-01-06 NOTE — Plan of Care (Signed)
Problem: Safety  Goal: Patient will be free from injury during hospitalization  Outcome: Progressing    Problem: Pain  Goal: Patient's pain/discomfort is manageable  Outcome: Progressing    Problem: Tissue integrity  Goal: Damaged tissue is healing and protected  Outcome: Progressing    Problem: Moderate/High Fall Risk Score >5  Goal: Patient will remain free of falls  Outcome: Progressing

## 2015-01-06 NOTE — PT Progress Note (Signed)
VHS: [facility]  Department of Rehabilitation Services: [phone]  Sang Blount    CSN: [id number]    NEURO SURGICAL   487/487-A    Physical Therapy Treatment Note    Time of treatment:   Time Calculation  PT Received On: 01/06/15  Start Time: 1002  Stop Time: 1040  Time Calculation (min): 38 min    Visit#: 3    Last seen by Physical therapist vs. PTA: 01/05/2015    Medical Diagnosis/Pertinent medical/surgical details: lumbar spondylolisthesis requiring L5-S1 PLIF 01/03/15, pt with cerebral palsy    Precautions and Contraindications:  Spinal Precautions (no bending, lifting or twisting)  Falls  HR 50-120, SBP 90-180    Assessment:   Patient's progress towards established goals: Pt able to participate in stair training for the first time since admission. Pt appears highly motivated to participate in therapy session.     Patient continues to have the following impairments: decreased ROM, decreased strength, decreased safety/judgement during functional mobility, impaired motor control , decreased functional mobility, decreased balance, gait deficits, pain    Patient will continue to benefit from skilled PT services in order to address above impairments to facilitate return to PLOF.        Goals:   Patient to achieve in 3-5 visits:  1) Pt will be Mod I with log rolling for increased time in order to change position. MET  2) Pt will be Mod I supine to/from sit for increased time in order to prepare for OOB activities. ONGOING   3) Pt will be Mod I for sit to stand transfers with FWW in order to perform Ambulation activities. ONGOING  4) Pt will Amb 100' with FWW and supervision in order to safely navigate the home. MET  5) Pt will ascend/descend 4 steps with HR and Min A in order to safely enter and exit the home. ONGOING (met with B HRs)    Further goals to be formulated based on patient's progress      Plan:   Treatment/interventions: Exercise, Gait training, Stair training, Functional transfer  training, Patient/caregiver training, Bed mobility    Treatment Frequency: 7x/wk    DISCHARGE RECOMMENDATIONS   DME recommended for Discharge:   Front wheeled walker    Discharge Recommendations:   Home with supervision    Subjective:   "I had to get out of bed, I can't take staying in bed that much.That is what is making me hurt more."  Patient is agreeable to participation in the therapy session. Nursing clears patient for therapy.     Pain:  At Rest: 7 /10  With Activity: 7/10  Location: Back:  Lower, Leg:  right  Interventions: Medication (see eMAR), Repositioned    OBJECTIVE:   Observation of Patient/Vital Signs:   Patient is sitting on the couch with dressings in place.  Patient's medical condition is appropriate for Physical therapy intervention at this time.    Vital Signs:  BP after activity: 107/64 mmHg  HR after activity: 96 bpm    Oriented to: Oriented x4  Command following: Follows ALL commands and directions without difficulty  Alertness/Arousal: Appropriate responses to stimuli   Attention Span:Appears intact  Memory: Decreased recall of precautions, pt recall 2 spinal precautions independently  Safety Awareness: minimal verbal instruction  Insights: Fully aware of deficits    Musculoskeletal and Balance Details:               Functional Mobility:   Bed Mobility:   Rolling to Left:  Supervision.   Cues for Hand placement., HOB flat, bed rails down  Supine to Sit:   Supervision.   Cues for Hand placement., HOB flat, bed rails down  Sit to Supine:   Minimal assist.   Cues for Log rolling., Cues for Sequencing., Cues for Hand placement., Comments: HOB flat, bed rails down, A at L LE    Transfers:  Sit to Stand:  Supervision with Front wheeled walker, Gait belt.    Cues for hand placement.  Stand to Sit:  Supervision.    Cues for hand placement.    Locomotion:  LEVEL AMBULATION:  Distance: 200'   Assistance level:  Supervision  Device:  Front wheeled walker, Gait belt  Pattern:  Step through, Narrow base  of support, Decreased cadence, Decreased step length:  bilaterally, Decreased clearance:  bilaterally  Comments: No LOB noted. VCs given to encourage pt to maintain an upright posture .  STAIR MANAGEMENT:  Number of steps: 4  Minimal assist  Gait belt  Two rails  Step-to pattern  Comments: No LOB noted. VCs given for sequence.    Pt required maximal assist to don shoes prior to ambulation.   Participation and Activity Tolerance   Participation effort: Excellent  Activity Tolerance: Tolerates 30 minutes of exercise without rest breaks    Other Treatment Interventions this session:   Therapeutic activity  Herbalist education     Education Provided:   TOPICS: role of physical therapy, plan of care, goals of therapy and safety with mobility and ADLs, benefits of activity, energy conservation techniques, spine precautions, home safety, activity with nursing    Learner educated: Patient  Method: Explanation  Response to education: Verbalized understanding and Demonstrated understanding    Patient Position at End of Treatment:   Sitting, in a chair, Staff present: CNA to set pt up for a shower, Needs in reach and No distress, pt in reclined geri chair    Team Communication:   Spoke to : RN/LPN Fulton Mole, CNA   Regarding: Pre-session re: patient status, Patient position at end of session, Patient participation with Therapy, Further recommendations  Whiteboard updated: Yes  PT/PTA communication: via written note and verbal communication as needed.      Recommend patient sit in chair for meals (limit sitting to 20 min increments), walk in hall with walker and assistance 3x/day outside of PT sessions.    Yajahira Tison [employer]

## 2015-01-06 NOTE — Plan of Care (Signed)
Problem: Pain  Goal: Patient's pain/discomfort is manageable  Outcome: Progressing

## 2015-01-06 NOTE — OT Progress Note (Signed)
VHS: [facility]  Department of Rehabilitation Services: [phone]    Major [place]    CSN#: [id number]  NEURO SURGICAL 487/487-A    Occupational Therapy Progress Note    Patient's medical condition is appropriate for Occupational therapy intervention at this time.    Time of treatment:   Time Calculation  OT Received On: 01/06/15  Start Time: 1557  Stop Time: 1624  Time Calculation (min): 27 min    Visit#: 3    Medical Diagnosis/Pertinent medical/surgical details: L5-S1 PLIF    Precautions and Contraindications:   Spinal Precautions (no bending, lifting or twisting)  Falls    Assessment:   Patient's progress towards established goals: pt with limited progress this date 2* increased RLE pain.  Please refer to ADL/functional activities portion    Patient continues to have the following impairments: decreased ROM, decreased strength, decreased independence with ADLs, decreased independence with IADLs, decreased activity tolerance, decrease safety awareness    Patient will continue to benefit from skilled OT services in order to improve functional independence       Goals:   1. Patient will be min A for donning/doffing pants, using AT/AE p.r.n. =MET   2. Patient will be min A for donning/doffing socks, using AT/AE p.r.n. = ongoing   3. Patient will be (S) with toilet transfer, using DME p.r.n. =met    LTG:   1. Patient will be (S) - min A for ADLs & functional transfers while maintaining spinal precautions 100% of the time, using AT/AE/DME p.r.n. =ongoing      Plan:   Treatment/interventions: ADL retraining, functional transfer training, UE strengthening/ROM, endurance training, patient/family training    Treatment Frequency: OT Frequency Recommended: 6-7x/wk     DISCHARGE RECOMMENDATIONS   DME recommended for Discharge:   Has needed equipment    Discharge Recommendations:   Home with supervision       Subjective:   "My leg hurts worse now than it ever has, I think I need to  stop"  Patient is agreeable to participation in the therapy session.     Pain:  At Rest: 2 /10  With Activity: 8/10  Location: Leg:  right  Interventions: RN/LPN aware    Objective:   Observation of Patient:    Patient is seated in a bedside chair with dressings in place.     Vital Signs:   Stable with no signs/symptoms of distress     Oriented to: Oriented x4  Behavior: Cooperative        Activities of Daily Living:   TOILETING:  Supervision for perineal hygiene; standard toilet      Functional Mobility:   Bed Mobility:       Transfers:  Sit to Stand:  Supervision with Front wheeled walker  Chair: Supervision with Front wheeled walker    Participation and Activity Tolerance   Participation effort: Fair  Activity Tolerance: Tolerates less than exercise with no significant change in vital signs    Other Treatment Interventions:   Treatment Activities:   Pt able to ambulate in the hall for 10 feet as that is closer to the distance he will have to ambulate to get to his bathroom at home. Pt then began to c/o RLE pain and returned to sitting at EOB.            Education Provided:   Topics: Role of occupational therapy, plan of care, goals of therapy and safety with mobility and ADLs, benefits of activity, energy conservation  techniques.    Individuals educated: Patient.  Method: Explanation and Demonstration.  Response to education: Verbalized understanding.    Team Communication:   OT communicated with: RN/LPN -  Alice  OT communicated regarding: Patient participation with Therapy  OT/COTA communication: via written note and verbal communication as needed.    Sitting, at the edge of the bed and Needs in reach    Recommend client participate in functional tasks outside of and in addition to OT session.    [place] April

## 2015-01-06 NOTE — Progress Notes (Signed)
Uneventful shift.  Pain is controled at this time.  Nurse is staying on top of pain medication.  Plans are for him to go home with wife tomorrow providing he does not spike a temp tonight.

## 2015-01-06 NOTE — Progress Notes (Addendum)
Pt has rested well on shift. Pt has c/o back/leg pain; pt medicated with Percocet as needed for pain. Percocet was effective in pain reduction. Pt had a temp of 101 & was given Tylenol; Tylenol reduced temp to 99. Pt was also encouraged to leave his blankets off/take some layers off as well as use his IS while when awake. Pt's 02 sats were also sating at 89-90 on room air. Pt was put on 2L NC throughout the night. Pt has used the urinal throughout the night & states he is having no issues with urination. Pt has urinated 867ml's on shift. Pt's dressing has remained C/D/I. Neuro assessment remains unchanged. Pt is currently resting. Bed alarm on, call bell within reach & will continue to monitor.

## 2015-01-07 NOTE — Progress Notes (Signed)
Uneventful shift.  Complaint of  Pain, medication was given.  Patient has been up and ambulating in room with walker.  Has not been ambulating as much today.

## 2015-01-07 NOTE — Plan of Care (Signed)
Problem: Safety  Goal: Patient will be free from injury during hospitalization  Outcome: Progressing    Problem: Pain  Goal: Patient's pain/discomfort is manageable  Outcome: Progressing    Problem: Moderate/High Fall Risk Score >5  Goal: Patient will remain free of falls  Outcome: Progressing

## 2015-01-07 NOTE — Plan of Care (Signed)
Problem: Safety  Goal: Patient will be free from injury during hospitalization  Outcome: Progressing    Problem: Pain  Goal: Patient's pain/discomfort is manageable  Outcome: Progressing    Problem: Psychosocial and Spiritual Needs  Goal: Demonstrates ability to cope with hospitalization/illness  Outcome: Progressing

## 2015-01-07 NOTE — PT Plan of Care Note (Signed)
[facility]  [facility]  [address]  [place] [address]  Department of Rehabilitation Services  [phone]  Arin Peral CSN: [id number]  NEURO SURGICAL 487/487-A    Physical Therapy General Note  Time: 1124    Last seen by a Physical Therapist vs. PTA: 01/07/2015    Attempted to see pt for PT tx, H/R "too much leg pain 10/10.    Team Communication: told patient's nurse and she will give patient pain pill.    Plan: Will follow up this afternoon per POC.     Nino Glow

## 2015-01-07 NOTE — Progress Notes (Signed)
Received report from night shift.  Patient has complained of pain throughout the night as well as complaints of leg cramping.  Patient is resting.  NP at bedside.

## 2015-01-07 NOTE — Progress Notes (Addendum)
NEUROSURGERY DAILY PROGRESS NOTE    Date/Time: 01/07/2015 7:34 AM  Patient Name: [name] [name],[name] [name]  Length of Stay: Day 4  Post-op Day: 4  Procedure: L5S1 PLIF  For questions page: Anastasia Pall NP-C pager # [phone]  or Dr.Jamilla Galli pager # [phone]    Subjective:   Complaints: Denies headache, nausea, abd discomfort, numbness, tingling or weakness. Reports back and incisional pain 9/10 & right leg spasms    Vitals:     Filed Vitals:    01/06/15 0532 01/06/15 0759 01/06/15 2256 01/07/15 0729   BP: 98/61 101/54 93/52 121/67   Pulse: 79 80 66 91   Temp: 99 F (37.2 C) 98.8 F (37.1 C) 99.3 F (37.4 C) 99.4 F (37.4 C)   TempSrc: Oral Oral Oral    Resp:  16 16 16    Height:       Weight:       SpO2: 99% 99% 99% 96%        Medications:     Current Facility-Administered Medications   Medication Dose Route Frequency   . sodium chloride (PF)  3 mL Intravenous Q8H   . Vitamin D  1,000 Units Oral Daily       Labs:         Invalid input(s): ADIFF, REFLX, CANCL, BAND, ABAND                                    Radiology:   No results found.    Exam:   Mental Status: Awake and alert. No acute distress  Orientation: Oriented to Name/Place/Year  Cranial Nerves: CN 2-12 grossly intact  Motor: 5/5 throughout except left leg  Sensory: Intact globally  Abd: Slightly rounded, BS all quadrants, soft & nontender  Incision: clean/dry/intact; approximated    Assessment/Plan:   Stable condition  Pain control: continues very painful  Mobilize with PT/OT  Routine post op care    Signed by: Caprice Renshaw, NP-C                      Neurosurgery      Continue current treatment and assess for rehabilitation/placement  Will discuss with Dr. Azzie Almas see if imaging would be useful given his continued leg pain. Most recent imaging including x-rays appear stable when compared to intraoperative images.    Barbera Setters. Bellamy Judson, MD  Division of Neurosurgery  [facility]

## 2015-01-07 NOTE — OT Progress Note (Signed)
VHS: [facility]  Department of Rehabilitation Services: [phone]    [name] [name]    CSN#: [id number]  NEURO SURGICAL 487/487-A    Occupational Therapy Progress Note    Patient's medical condition is appropriate for Occupational therapy intervention at this time.    Time of treatment:   Time Calculation  OT Received On: 01/07/15  Start Time: 1457  Stop Time: 1510  Time Calculation (min): 13 min    Visit#: 4    Medical Diagnosis/Pertinent medical/surgical details: L5-S1 PLIF     Precautions and Contraindications:   Spinal Precautions (no bending, lifting or twisting)  Falls    Assessment:   Patient's progress towards established goals: Pt met remaining goal to don/doff socks with Min A (completed with supervision) with use of AE. Pt has met all goals for OT, reports no concerns re: completion of self-care upon [place] with use of AE/compensatory techniques.      Patient continues to have the following impairments: appears to be at baseline for ADLs with use of compensatory techniques and AE     Goals:   1. Patient will be min A for donning/doffing pants, using AT/AE p.r.n. =MET   2. Patient will be min A for donning/doffing socks, using AT/AE p.r.n. = MET    3. Patient will be (S) with toilet transfer, using DME p.r.n. =MET     LTG:   1. Patient will be (S) - min A for ADLs & functional transfers while maintaining spinal precautions 100% of the time, using AT/AE/DME p.r.n. =MET      Plan:   Treatment/interventions: no skilled interventions needed at this time    [place] OT - Pt has met all OT goals     DISCHARGE RECOMMENDATIONS   DME recommended for Discharge:   Reacher  Sock aid  Shower chair    Discharge Recommendations:     Home with supervision     Subjective:   "I think I'll be good once I get home"     Patient is agreeable to participation in the therapy session. Nursing clears patient for therapy.     Pain:  At Rest: 7 /10  With Activity: 7/10  Location: back  Interventions: None  required    Objective:   Observation of Patient:    Patient is seated in a bedside chair with peripheral IV in place.     Vital Signs:   Stable with no signs/symptoms of distress     Oriented to: Oriented x4  Command following: Follows ALL commands and directions without difficulty  Alertness/Arousal: Appropriate responses to stimuli   Behavior: Cooperative           Activities of Daily Living:   LB DRESSING: Supervision to don/doff socks with use of AE while seated in chair     Functional Mobility:   Bed Mobility:  Not Tested due to patient out of bed for duration of session     Transfers:  Not tested     Participation and Activity Tolerance   Participation effort: Excellent  Activity Tolerance: Tolerates 10-20 minutes exercise without rest breaks    Other Treatment Interventions:   Treatment Activities:   Not applicable          Education Provided:   Topics: Role of occupational therapy, plan of care, goals of therapy and safety with mobility and ADLs, benefits of activity, spine precautions, use of adaptive equipment.    Individuals educated: Patient.  Method: Explanation.  Response to education: Verbalized  understanding.    Team Communication:   OT communicated with: RN/LPN   OT communicated regarding: Pre-session re: patient status, Patient participation with Therapy  OT/COTA communication: via written note and verbal communication as needed.    Sitting, in a chair, Needs in reach and No distress    Recommend client sit up for meals, participate in as much of own self-care as able while maintaining safety.     Juan Quam  , MSOT, OT License Applicant

## 2015-01-07 NOTE — PT Plan of Care Note (Signed)
VHS: [facility]  Department of Rehabilitation Services: [phone]  Zacariah Belue    CSN: [id number]    NEURO SURGICAL   487/487-A    Physical Therapy Treatment Note    Time of treatment:   Time Calculation  PT Received On: 01/07/15  Start Time: 1301  Stop Time: 1333  Time Calculation (min): 32 min    Visit#: 4    Last seen by Physical therapist vs. PTA: 01/07/2015    Medical Diagnosis/Pertinent medical/surgical details: lumbar spondylolisthesis requiring L5-S1 PLIF 01/03/15, pt with cerebral palsy    Precautions and Contraindications:  Spinal Precautions (no bending, lifting or twisting)  Falls  HR 50-120, SBP 90-180    Assessment:   Patient's progress towards established goals: Patient not able to walk as far today due to increased R LE pain and muscle spasm compared to yesterday.     Patient continues to have the following impairments: decreased ROM, decreased strength, decreased activity tolerance, decreased functional mobility, decreased balance, gait deficits, pain    Patient will continue to benefit from skilled PT services in order to restore PLOF and return home.       Goals:   Patient to achieve in 3-5 visits:  1) Pt will be Mod I with log rolling for increased time in order to change position. MET  2) Pt will be Mod I supine to/from sit for increased time in order to prepare for OOB activities. MET    3) Pt will be Mod I for sit to stand transfers with FWW in order to perform Ambulation activities. MET  4) Pt will Amb 100' with FWW and supervision in order to safely navigate the home. MET  5) Pt will ascend/descend 4 steps with HR and Min A in order to safely enter and exit the home. ONGOING (met with B HRs)     Plan:   Treatment/interventions: Exercise, Gait training, Stair training    Treatment Frequency: 6-7x/wk    DISCHARGE RECOMMENDATIONS   DME recommended for Discharge:   Front wheeled walker    Discharge Recommendations:   Home with outpatient PT 24 hour supervision  needed    Subjective:   Patient reports that he is feeling better this afternoon than this a.m. But that the pain pills do not stop his R LE muscle spasms   Patient is agreeable to participation in the therapy session.     Pain:  At Rest:   /10  With Activity: 6/10  Location: Back:  Lower, Leg:  right  Interventions: None required    OBJECTIVE:   Observation of Patient/Vital Signs:   Patient is in bed with no medical equipment in place.  Patient's medical condition is appropriate for Physical therapy intervention at this time.    Vital Signs:  Stable with no signs/symptoms of distress         Oriented to: Oriented x4  Command following: Follows ALL commands and directions without difficulty  Alertness/Arousal: Appropriate responses to stimuli   Attention Span:Appears intact  Memory: Appears intact  Safety Awareness: Independent  Insights: Fully aware of deficits  Problem Solving: supervision    Musculoskeletal and Balance Details:             Functional Mobility:   Bed Mobility:   Rolling to Left:  Modified independence .        Supine to Sit:   Independent.        Sit to Supine:   Independent.  Seated Scooting:   Modified independence     Transfers:  Sit to Stand:  Modified independence  with Front wheeled walker.         Stand to Sit:  Modified independence .         Stand Pivot:   Modified independence  with Front wheeled walker.          Locomotion:  LEVEL AMBULATION:  Distance: 150 ft   Assistance level:  Supervision  Device:  Front wheeled walker  Pattern:  Step to, Decreased cadence, Decreased step length:  bilaterally    Participation and Activity Tolerance   Participation effort: Excellent  Activity Tolerance: Tolerates 30 minutes of exercise without rest breaks    Other Treatment Interventions this session:   Therapeutic exercise:  Sitting:  Hamstring stretch:  with DF for R LE to stretch nerve and muscles   Gait training     Education Provided:   TOPICS: role of physical therapy, plan of care, goals  of therapy and HEP, safety with mobility and ADLs, benefits of activity, spine precautions, re-emphasized importance of limiting sitting to no more than 20 minutes at a time.  Encouraged patient to change positions throughout the day.    Learner educated: Patient  Method: Explanation and Demonstration  Response to education: Verbalized understanding and Demonstrated understanding    Patient Position at End of Treatment:   Supine, in bed, Needs in reach and No distress    Team Communication:   Spoke to : RN/LPN -    Regarding: Patient participation with Therapy  Whiteboard updated: No  PT/PTA communication: via written note and verbal communication as needed.      Recommend patient change positions and limit sitting to no more than 20 minutes taking shorter more frequent walks with nsg or famiy outside of PT sessions.    Nino Glow

## 2015-01-08 MED ORDER — TIZANIDINE HCL 2 MG PO TABS
2.0000 mg | ORAL_TABLET | Freq: Four times a day (QID) | ORAL | Status: DC | PRN
Start: 2015-01-08 — End: 2015-06-01

## 2015-01-08 MED ORDER — TIZANIDINE HCL 2 MG PO TABS
2.0000 mg | ORAL_TABLET | Freq: Four times a day (QID) | ORAL | Status: DC | PRN
Start: 2015-01-08 — End: 2015-01-08
  Administered 2015-01-08: 2 mg via ORAL
  Filled 2015-01-08 (×2): qty 2

## 2015-01-08 NOTE — PT Plan of Care Note (Signed)
[facility]  [facility]  [address]  [place] [address]  Department of Rehabilitation Services  [phone]  Kaiser Belluomini CSN: [id number]  NEURO SURGICAL 487/487-A    Physical Therapy General Note  Time: 1    Last seen by a Physical Therapist vs. PTA: 01/07/15    Attempted to see pt for PT tx  H/R;pt. Politely deferred the need for IPPT services at this time;reporting he's I in room and amb. In hall C FWW;hoping to [place] home today and denies any [place] needs or concerns.  Team Communication: pt. And nurse Jodee    Plan: Will leave on schedule should [place] plans change per POC.     Irven Coe

## 2015-01-08 NOTE — Discharge Summary (Signed)
Neurosurgery Discharge Summary  [facility]  Dr. Blanchard Mane    Date/Time: 01/08/2015 12:34 PM  Patient Name: [name],[name]    Date of Admission:   01/03/2015    Date of Discharge:   01/08/2015      Reason for Admission:   Spondylolisthesis of lumbosacral region [M43.17]    Procedure(s):   01/03/2015  Procedure(s):  L5-S1 Posterior Lumbar Interbody Fusion    Hospital Course:   Please see admission and daily notes for further details.  [name] was admitted on 01/03/2015.  The hospital course was uncomplicated.  Prior to discharge, pain was well-controlled and the neurological exam was stable.  He remained afebrile, tolerated PO intake, and ambulated with minimal assistance with a FWW.  On the day of discharge he was doing well and deemed ready for discharge to Home with home health in good condition.     Discharge Medications:     Current Discharge Medication List      START taking these medications    Details   oxyCODONE-acetaminophen (PERCOCET) 5-325 MG per tablet Take 1-2 tablets by mouth every 4 (four) hours as needed.      tiZANidine (ZANAFLEX) 2 MG tablet Take 1-2 tablets (2-4 mg total) by mouth every 6 (six) hours as needed (Start with 2 mg intially, may increase to 4 mg.).         CONTINUE these medications which have NOT CHANGED    Details   Cholecalciferol (VITAMIN D) 1000 UNIT tablet Take 1,000 Units by mouth daily.      Omega-3 Fatty Acids (OMEGA-3 FISH OIL) 500 MG Cap Take by mouth.         STOP taking these medications       HYDROcodone-acetaminophen (NORCO) 5-325 MG per tablet              Discharge Instructions:   Discharge instructions were given.  The patient is to followup with Dr. Blanchard Mane.  The patient was instructed to call or return for fever, wound redness/drainage/swelling, new or progressive neurological symptoms, or other concerns.      Mathis Bud, NP

## 2015-01-08 NOTE — Progress Notes (Signed)
Uneventful shift. Patient medicated twice with PRN Percocet and once with PRN Valium for pain. Patient did not ambulate this shift d/t PT advising pt that he needs to rest and that muscle spasms with decrease with rest.

## 2015-01-08 NOTE — Progress Notes (Signed)
Neurosurgery Update Note      Assessment/Plan:   His muscle spasms are much better with the Zanaflex.  He is ready to be discharged home.    Vitals     Filed Vitals:    01/08/15 0843   BP: 112/67   Pulse: 72   Temp: 98.5 F (36.9 C)   Resp: 16   SpO2: 99%       Labs:         Invalid input(s): ADIFF, REFLX, CANCL, BAND, ABAND                                  Microbiology     Microbiology Results     None            Radiology     No results found.      Aubra Pappalardo A. Jadynn Epping NP-C    Neurosurgery

## 2015-01-08 NOTE — Plan of Care (Signed)
Problem: Pain  Goal: Patient's pain/discomfort is manageable  Outcome: Progressing

## 2015-01-08 NOTE — Progress Notes (Addendum)
NEUROSURGERY DAILY PROGRESS NOTE    Date/Time: 01/08/2015 7:49 AM  Patient Name: [name] [name],[name] [name]  Length of Stay: Day 5  Post-op Day: 5  Procedure: L5-S1 PLIF  For questions page: Shirline Frees NP-C pager # [phone] or Dr. Azzie Almas pager # [phone]    Subjective:   Complaints: Reports back/incisonal pain.  Reports right leg cramping/spasms.     Vitals:     Filed Vitals:    01/06/15 2256 01/07/15 0729 01/07/15 1523 01/07/15 2313   BP: 93/52 121/67 90/55 96/56    Pulse: 66 91 90 86   Temp: 99.3 F (37.4 C) 99.4 F (37.4 C) 98 F (36.7 C) 99.9 F (37.7 C)   TempSrc: Oral   Oral   Resp: 16 16 16 16    Height:       Weight:       SpO2: 99% 96% 93% 98%        Medications:     Current Facility-Administered Medications   Medication Dose Route Frequency   . sodium chloride (PF)  3 mL Intravenous Q8H   . Vitamin D  1,000 Units Oral Daily       Labs:         Invalid input(s): ADIFF, REFLX, CANCL, BAND, ABAND                                    Radiology:   No results found.    Exam:   Mental Status: Awake and alert. No acute distress  Orientation: Oriented to Name/Place/Year  Cranial Nerves: Not tested  Motor: 5/5 throughout, LLE chronic weakness- at baseline  Sensory: Intact globally  Incision: clean/dry/intact; approximated    Assessment/Plan:   Doing well, stable condition  Continue mobilize with PT/OT  Pain control-Right leg pain is more consistent with muscle spasms.  No paresthesias.  Not radicular in nature.  No swelling or tenderness. No need for repeat imaging at this time. Will change Valium to Tizanidine to see if that helps muscle spasms.  Routine post op care.  Low grade temp.  Using IS.  On RA- 100%.  If doing better this afternoon, will discharge home.    Signed by: Gweneth Dimitri. Linc Renne NP-C  Neurosurgery

## 2015-01-08 NOTE — Progress Notes (Addendum)
Pt states Zanaflex was effective.  Ready for d/c per NP.

## 2015-02-12 ENCOUNTER — Emergency Department: Payer: Self-pay

## 2015-02-12 ENCOUNTER — Encounter: Payer: Self-pay | Admitting: Emergency Medicine

## 2015-02-12 ENCOUNTER — Emergency Department
Admission: EM | Admit: 2015-02-12 | Discharge: 2015-02-12 | Disposition: A | Discharge status: Home / Self Care | Payer: Self-pay | Attending: Emergency Medicine | Admitting: Emergency Medicine

## 2015-02-12 DIAGNOSIS — R0602 Shortness of breath: Secondary | ICD-10-CM | POA: Insufficient documentation

## 2015-02-12 DIAGNOSIS — J45909 Unspecified asthma, uncomplicated: Secondary | ICD-10-CM | POA: Insufficient documentation

## 2015-02-12 MED ORDER — PREDNISONE 10 MG PO TABS
ORAL_TABLET | ORAL | Status: AC
Start: 2015-02-12 — End: ?
  Filled 2015-02-12: qty 3

## 2015-02-12 MED ORDER — METHYLPREDNISOLONE 4 MG PO TBPK
ORAL_TABLET | ORAL | Status: DC
Start: 2015-02-12 — End: 2015-06-01

## 2015-02-12 MED ORDER — PREDNISONE 20 MG PO TABS
30.0000 mg | ORAL_TABLET | Freq: Once | ORAL | Status: AC
Start: 2015-02-12 — End: 2015-02-12
  Administered 2015-02-12: 30 mg via ORAL

## 2015-02-12 MED ORDER — SULFAMETHOXAZOLE-TRIMETHOPRIM 800-160 MG PO TABS
2.0000 | ORAL_TABLET | Freq: Two times a day (BID) | ORAL | Status: AC
Start: 2015-02-12 — End: 2015-02-26

## 2015-02-12 MED ORDER — ALBUTEROL-IPRATROPIUM 2.5-0.5 (3) MG/3ML IN SOLN
3.0000 mL | Freq: Once | RESPIRATORY_TRACT | Status: AC
Start: 2015-02-12 — End: 2015-02-12
  Administered 2015-02-12: 3 mL via RESPIRATORY_TRACT

## 2015-02-12 MED ORDER — AZITHROMYCIN 250 MG PO TABS
ORAL_TABLET | ORAL | Status: DC
Start: 2015-02-12 — End: 2015-02-12

## 2015-02-12 MED ORDER — BENZONATATE 100 MG PO CAPS
100.0000 mg | ORAL_CAPSULE | Freq: Three times a day (TID) | ORAL | Status: DC | PRN
Start: 2015-02-12 — End: 2015-06-01

## 2015-02-12 MED ORDER — ALBUTEROL-IPRATROPIUM 2.5-0.5 (3) MG/3ML IN SOLN
RESPIRATORY_TRACT | Status: AC
Start: 2015-02-12 — End: ?
  Filled 2015-02-12: qty 3

## 2015-02-12 MED ORDER — ALBUTEROL SULFATE HFA 108 (90 BASE) MCG/ACT IN AERS
1.0000 | INHALATION_SPRAY | RESPIRATORY_TRACT | Status: DC | PRN
Start: 2015-02-12 — End: 2015-06-01

## 2015-02-12 NOTE — Discharge Instructions (Signed)
What is Acute Bronchitis?    Acute or short-term bronchitis last for days or weeks. Itoccurs when the bronchial tubes (airways in the lungs) are irritated by a virus, bacteria, or allergen. This causes a cough that produces yellow or greenish mucus.  Inside healthy lungs  Air travels in and out of the lungs through the airways. The linings of these airways produce sticky mucus. This mucus traps particles that enter the lungs. Tiny structures called cilia then sweep the particles out of the airways.     Lungs with bronchitis  Bronchitis often occurs when a cold or the flu virus. The airways become inflamed (red and swollen).There isa deep "hacking" cough from the extra mucus. Other symptoms may include:   Wheezing   Chest discomfort   Shortness of breath   Mild fever  A second infection, this time due to bacteria, may then occur. And, airways irritated by allergens or smoke are more likely to get infected.     Making a diagnosis  A physical exam, medical history, and certain tests help your health care provider make the diagnosis.  Medicalhistory  Your health care providerwillask youabout your symptoms.  The exam  Your provider listens toyour chest for signs of congestion. He or she may also checkyour ears, nose, and throat.  Possible tests   A sputum testfor bacteria. This requires a sample of mucus from the lungs.   A nasal or throat swabfor bacterial infection.   A chest X-rayif your health care provider suspects pneumonia.   Tests to check for an underlying condition,such as allergies, asthma, or COPD. You may be referred to a specialist for further lung function testing.  Treating a cough  The main treatment for bronchitis is easing symptoms. Avoiding smoke, allergens, and other things that trigger coughing can often help. If the infection is bacterial, antibiotics may be used. During the illness, it's important to get plenty of sleep. To ease symptoms:   Don't smoke, and avoid secondhand  smoke.   Use a humidifier, or breathe in steam from a hot shower. This may help loosen mucus.   Drink a lot of water and juice. They can soothe the throat and may help thin mucus.   Sit up or use extra pillows when in bed to help lessen coughing and congestion.   Ask your providerabout usingcough medicine, pain and fever medication, or a decongestant.  Antibiotics  Most cases of bronchitis are caused by cold or flu viruses. Antibiotics don't treat viral illness. Taking antibiotics when they are not needed increases your risk of getting an infection later that is antibiotic-resistant.Your provider will prescribe antibiotics if the infection is caused by bacteria. If they are prescribed:   Take the medication until it is used up, even if symptoms have improved. If you don't, the bronchitis may come back.   Take them as directed. For instance, some medications should be taken with food.   Ask your provider or pharmacist what side effects are common, and what to do about them.  Follow-up care  You should go see your provider again in 2 to 3 weeks. By this time, symptoms should have improved. An infection that lasts longer may signal a more serious problem.  Prevention   Avoid tobacco smoke. If you smoke, quit. Stay away from smoky places. Ask friends and family not to smoke around you, or in your home or car.   Get checked forallergies.   Ask your provider about getting a yearly flu shot, and   pneumoccocal or pneumonia shots.   Wash your hands often. This helps reduce the chance of picking up viruses that cause colds and flu.       2000-2015 [employer] [address], [place] [address]. All rights reserved. This information is not intended as a substitute for professional medical care. Always follow your healthcare professional's instructions.

## 2015-02-12 NOTE — ED Provider Notes (Signed)
Physician/Midlevel provider first contact with patient: 02/12/15 0514         History     Chief Complaint   Patient presents with   . Cough     HPI Comments: 54 y/o male pt c/o cough that he has had for 4 days, non-productive. He has Hx of bronchitis, is concerned he might have this again. He has had occasional SOB.     Patient is a 54 y.o. male presenting with cough. The history is provided by the patient. No language interpreter was used.   Cough  Cough characteristics:  Non-productive  Severity:  Moderate  Onset quality:  Gradual  Duration:  4 days  Timing:  Constant  Progression:  Unchanged  Chronicity:  New  Associated symptoms: shortness of breath (occasional)    Associated symptoms: no chills, no fever, no headaches, no rash and no sore throat             Past Medical History   Diagnosis Date   . Arthritis    . Muscle pain      torn bicep    . Tobacco dependence    . Chronic shoulder pain, right    . Chronic back pain    . Low back pain    . Difficulty walking    . Cerebral palsy        Past Surgical History   Procedure Laterality Date   . Tracheostomy     . Leg surgery       left leg surgery as child from CP   . Fracture surgery       hip surgery    . Hernia repair     . Fracture surgery     . Laminectomy, posterior lumbar, decompression, fusion level 1 N/A 01/03/2015     Procedure: LAMINECTOMY, POSTERIOR LUMBAR, DECOMPRESSION, FUSION LEVEL 1;  Surgeon: Haskel Khan, MD;  Location: [facility];  Service: Neurosurgery;  Laterality: N/A;  L5-S1 PLIF       Family History   Problem Relation Age of Onset   . Diabetes Brother    . No known problems Mother    . Esophageal cancer Father        Social  History   Substance Use Topics   . Smoking status: Current Every Day Smoker -- 1.00 packs/day     Types: Cigarettes   . Smokeless tobacco: Never Used   . Alcohol Use: No       .     Allergies   Allergen Reactions   . Codeine Nausea And Vomiting   . Flexeril [Cyclobenzaprine]    . Naprosyn [Naproxen]    .  Toradol [Ketorolac Tromethamine] Nausea And Vomiting   . Tramadol        Home Medications     Last Medication Reconciliation Action:  In Progress Laurine Blazer, RN 02/12/2015  5:23 AM                  Cholecalciferol (VITAMIN D) 1000 UNIT tablet     Take 1,000 Units by mouth daily.     Omega-3 Fatty Acids (OMEGA-3 FISH OIL) 500 MG Cap     Take by mouth.     oxyCODONE-acetaminophen (PERCOCET) 5-325 MG per tablet     Take 1-2 tablets by mouth every 4 (four) hours as needed.     tiZANidine (ZANAFLEX) 2 MG tablet     Take 1-2 tablets (2-4 mg total) by mouth every 6 (six) hours as  needed (Start with 2 mg intially, may increase to 4 mg.).           Review of Systems   Constitutional: Negative for fever and chills.   HENT: Negative for sore throat.    Respiratory: Positive for cough and shortness of breath (occasional).    Gastrointestinal: Negative for nausea, vomiting and abdominal pain.   Skin: Negative for rash.   Neurological: Negative for headaches.   All other systems reviewed and are negative.      Physical Exam    BP: 127/87 mmHg, Heart Rate: 86, Temp: 98 F (36.7 C), Resp Rate: 18, SpO2: 100 %, Weight: 51.6 kg    Physical Exam   Constitutional: He appears well-developed and well-nourished. No distress.   Pt is very thin   HENT:   Head: Normocephalic and atraumatic.   Right Ear: External ear normal.   Left Ear: External ear normal.   Nose: Nose normal.   Mouth/Throat: Oropharynx is clear and moist. No oropharyngeal exudate.   Eyes: Conjunctivae and EOM are normal. Right eye exhibits no discharge. Left eye exhibits no discharge. No scleral icterus.   Neck: Neck supple. No tracheal deviation present. No thyromegaly present.   Cardiovascular: Normal rate and regular rhythm.  Exam reveals no gallop and no friction rub.    No murmur heard.  Pulmonary/Chest: Effort normal. No stridor. No respiratory distress. He has wheezes. He has no rales. He exhibits no tenderness.   Abdominal: Bowel sounds are normal. He exhibits no  distension and no mass. There is no tenderness. There is no rebound and no guarding.   Musculoskeletal: He exhibits no edema or tenderness.   Limited ROM of back   Lymphadenopathy:     He has no cervical adenopathy.   Neurological: He is alert. He exhibits normal muscle tone. Coordination normal.   Skin: No rash noted. He is not diaphoretic. No erythema. No pallor.   Psychiatric: He has a normal mood and affect. His behavior is normal. Thought content normal.   Nursing note and vitals reviewed.        MDM and ED Course     ED Medication Orders     Start Ordered     Status Ordering Provider    02/12/15 [phone] 02/12/15 0527  albuterol-ipratropium (DUO-NEB) 2.5-0.5(3) mg/3 mL nebulizer 3 mL   RT - Once     Route: Nebulization  Ordered Dose: 3 mL     Last MAR action:  Given Jjesus Dingley, Kristine Garbe    02/12/15 0528 02/12/15 0527  predniSONE (DELTASONE) tablet 30 mg   Once in ED     Route: Oral  Ordered Dose: 30 mg     Last MAR action:  Given Ardella Chhim C         No results found.      MDM  Number of Diagnoses or Management Options  Asthmatic bronchitis, unspecified asthma severity, uncomplicated:   Diagnosis management comments: The differential diagnosis includes, but is not limited to febrile illness, otitis media, otitis externa, viral syndrome, allergic rhinitis, pneumonia, bronchitis, asthma, bronchospasm, sinusitis, pharyngitis/ tonsilitis, influenza, mono, neutropenia    Corrie Dandy was my scribe today and assisted me with documentation only.  He was present during the questioning and physical examination of this patient and documented what he observed and was instructed to document.  This note accurately reflects work and decisions made by me.         Amount and/or Complexity of Data Reviewed  Tests in  the radiology section of CPT: ordered and reviewed              Procedures    Clinical Impression & Disposition     Clinical Impression  Final diagnoses:   Asthmatic bronchitis, unspecified asthma severity,  uncomplicated        ED Disposition     Discharge [name] discharge to home/self care.    Condition at disposition: Stable             New Prescriptions    ALBUTEROL (PROVENTIL HFA;VENTOLIN HFA) 108 (90 BASE) MCG/ACT INHALER    Inhale 1-2 puffs into the lungs every 4 (four) hours as needed for Wheezing.    BENZONATATE (TESSALON PERLES) 100 MG CAPSULE    Take 1 capsule (100 mg total) by mouth 3 (three) times daily as needed for Cough. Swallow whole; do not chew    METHYLPREDNISOLONE (MEDROL DOSPACK) 4 MG TABLET    follow package directions    SULFAMETHOXAZOLE-TRIMETHOPRIM (BACTRIM DS,SEPTRA DS) 800-160 MG PER TABLET    Take 2 tablets by mouth 2 (two) times daily.        Treatment Team: Scribe: Kem Parkinson, MD  02/12/15 [phone]

## 2015-02-12 NOTE — ED Notes (Signed)
Patient's peak flow meter reading was 200 L/min

## 2015-03-09 ENCOUNTER — Other Ambulatory Visit (INDEPENDENT_AMBULATORY_CARE_PROVIDER_SITE_OTHER): Payer: Self-pay | Admitting: Orthopaedic Surgery of the Spine

## 2015-03-09 DIAGNOSIS — M25511 Pain in right shoulder: Secondary | ICD-10-CM

## 2015-03-15 ENCOUNTER — Ambulatory Visit (INDEPENDENT_AMBULATORY_CARE_PROVIDER_SITE_OTHER)
Admission: RE | Admit: 2015-03-15 | Discharge: 2015-03-15 | Disposition: A | Discharge status: Home / Self Care | Payer: Medicare Other | Source: Ambulatory Visit | Attending: Orthopaedic Surgery of the Spine | Admitting: Orthopaedic Surgery of the Spine

## 2015-03-15 DIAGNOSIS — M25511 Pain in right shoulder: Secondary | ICD-10-CM

## 2015-03-29 ENCOUNTER — Other Ambulatory Visit: Payer: Self-pay | Admitting: Neurological Surgery

## 2015-03-29 ENCOUNTER — Ambulatory Visit
Admission: RE | Admit: 2015-03-29 | Discharge: 2015-03-29 | Disposition: A | Discharge status: Home / Self Care | Payer: Medicare Other | Source: Ambulatory Visit | Attending: Family | Admitting: Family

## 2015-03-29 ENCOUNTER — Other Ambulatory Visit: Payer: Self-pay | Admitting: Family

## 2015-03-29 ENCOUNTER — Ambulatory Visit
Admission: RE | Admit: 2015-03-29 | Discharge: 2015-03-29 | Disposition: A | Discharge status: Home / Self Care | Payer: Medicare Other | Source: Ambulatory Visit | Attending: Neurological Surgery | Admitting: Neurological Surgery

## 2015-03-29 DIAGNOSIS — M1712 Unilateral primary osteoarthritis, left knee: Secondary | ICD-10-CM | POA: Insufficient documentation

## 2015-03-29 DIAGNOSIS — M25551 Pain in right hip: Secondary | ICD-10-CM

## 2015-03-29 DIAGNOSIS — M545 Low back pain: Secondary | ICD-10-CM

## 2015-03-29 DIAGNOSIS — M25562 Pain in left knee: Secondary | ICD-10-CM

## 2015-03-29 DIAGNOSIS — M5135 Other intervertebral disc degeneration, thoracolumbar region: Secondary | ICD-10-CM | POA: Insufficient documentation

## 2015-05-04 ENCOUNTER — Other Ambulatory Visit: Payer: Self-pay | Admitting: Nurse Practitioner

## 2015-05-04 DIAGNOSIS — M4317 Spondylolisthesis, lumbosacral region: Secondary | ICD-10-CM

## 2015-05-07 ENCOUNTER — Ambulatory Visit
Admission: RE | Admit: 2015-05-07 | Discharge: 2015-05-07 | Disposition: A | Discharge status: Home / Self Care | Payer: Medicare Other | Source: Ambulatory Visit | Attending: Nurse Practitioner | Admitting: Nurse Practitioner

## 2015-05-07 DIAGNOSIS — M4317 Spondylolisthesis, lumbosacral region: Secondary | ICD-10-CM

## 2015-05-15 ENCOUNTER — Ambulatory Visit
Admission: RE | Admit: 2015-05-15 | Discharge: 2015-05-15 | Disposition: A | Discharge status: Home / Self Care | Payer: Medicare Other | Source: Ambulatory Visit | Attending: Nurse Practitioner | Admitting: Nurse Practitioner

## 2015-05-15 DIAGNOSIS — M4317 Spondylolisthesis, lumbosacral region: Secondary | ICD-10-CM | POA: Insufficient documentation

## 2015-05-15 MED ORDER — GADOBUTROL 1 MMOL/ML IV SOLN
6.0000 mL | Freq: Once | INTRAVENOUS | Status: AC | PRN
Start: 2015-05-15 — End: 2015-05-15
  Administered 2015-05-15: 6 mmol via INTRAVENOUS

## 2015-05-16 ENCOUNTER — Emergency Department
Admission: EM | Admit: 2015-05-16 | Discharge: 2015-05-16 | Disposition: A | Discharge status: Home / Self Care | Payer: Medicare Other | Attending: Emergency Medicine | Admitting: Emergency Medicine

## 2015-05-16 ENCOUNTER — Emergency Department: Payer: Medicare Other

## 2015-05-16 ENCOUNTER — Ambulatory Visit
Admission: RE | Admit: 2015-05-16 | Discharge: 2015-05-16 | Disposition: A | Discharge status: Home / Self Care | Payer: Medicare Other | Source: Ambulatory Visit | Attending: Nurse Practitioner | Admitting: Nurse Practitioner

## 2015-05-16 DIAGNOSIS — M545 Low back pain: Secondary | ICD-10-CM | POA: Insufficient documentation

## 2015-05-16 DIAGNOSIS — M4317 Spondylolisthesis, lumbosacral region: Secondary | ICD-10-CM | POA: Insufficient documentation

## 2015-05-16 DIAGNOSIS — G8929 Other chronic pain: Secondary | ICD-10-CM | POA: Insufficient documentation

## 2015-05-16 DIAGNOSIS — Z981 Arthrodesis status: Secondary | ICD-10-CM | POA: Insufficient documentation

## 2015-05-16 DIAGNOSIS — M25511 Pain in right shoulder: Secondary | ICD-10-CM | POA: Insufficient documentation

## 2015-05-16 MED ORDER — OXYCODONE-ACETAMINOPHEN 5-325 MG PO TABS
1.0000 | ORAL_TABLET | Freq: Four times a day (QID) | ORAL | Status: DC | PRN
Start: 2015-05-16 — End: 2015-06-01

## 2015-05-16 NOTE — ED Notes (Signed)
Pt states he is having worsening pain of a right bicep tear. Pt states he has an appt. with pain management in two weeks

## 2015-05-16 NOTE — ED Provider Notes (Signed)
Physician/Midlevel provider first contact with patient: 05/16/15 1430         History     Chief Complaint   Patient presents with   . Shoulder Pain       Patient is a 54 yo male with a history of multiple orthopedic injuries including biceps tendon tear under the care of Dr. Blase Mess and a lumbar fusion under the care of  Dr. Azzie Almas.  He  presents with acute exacerbation of pain and requests a refill of pain meds.  He has follow up appts scheduled.           Past Medical History   Diagnosis Date   . Arthritis    . Muscle pain      torn bicep    . Tobacco dependence    . Chronic shoulder pain, right    . Chronic back pain    . Low back pain    . Difficulty walking    . Cerebral palsy        Past Surgical History   Procedure Laterality Date   . Tracheostomy     . Leg surgery       left leg surgery as child from CP   . Fracture surgery       hip surgery    . Hernia repair     . Fracture surgery     . Laminectomy, posterior lumbar, decompression, fusion level 1 N/A 01/03/2015     Procedure: LAMINECTOMY, POSTERIOR LUMBAR, DECOMPRESSION, FUSION LEVEL 1;  Surgeon: Haskel Khan, MD;  Location: [facility];  Service: Neurosurgery;  Laterality: N/A;  L5-S1 PLIF       Family History   Problem Relation Age of Onset   . Diabetes Brother    . No known problems Mother    . Esophageal cancer Father        Social  Social History   Substance Use Topics   . Smoking status: Current Every Day Smoker -- 1.00 packs/day     Types: Cigarettes   . Smokeless tobacco: Never Used   . Alcohol Use: No       .     Allergies   Allergen Reactions   . Codeine Nausea And Vomiting   . Flexeril [Cyclobenzaprine]    . Naprosyn [Naproxen]    . Toradol [Ketorolac Tromethamine] Nausea And Vomiting   . Tramadol        Home Medications     Last Medication Reconciliation Action:  Complete Lurena Joiner, RN 05/16/2015  2:33 PM                  albuterol (PROVENTIL HFA;VENTOLIN HFA) 108 (90 BASE) MCG/ACT inhaler     Inhale 1-2 puffs into the lungs  every 4 (four) hours as needed for Wheezing.     benzonatate (TESSALON PERLES) 100 MG capsule     Take 1 capsule (100 mg total) by mouth 3 (three) times daily as needed for Cough. Swallow whole; do not chew     Cholecalciferol (VITAMIN D) 1000 UNIT tablet     Take 1,000 Units by mouth daily.     methylPREDNISolone (MEDROL DOSPACK) 4 MG tablet     follow package directions     Omega-3 Fatty Acids (OMEGA-3 FISH OIL) 500 MG Cap     Take by mouth.     tiZANidine (ZANAFLEX) 2 MG tablet     Take 1-2 tablets (2-4 mg total) by mouth every 6 (six) hours as  needed (Start with 2 mg intially, may increase to 4 mg.).                     Review of Systems   Constitutional: Negative.    Musculoskeletal: Positive for myalgias and arthralgias.   Skin: Negative.        Physical Exam    BP: 127/88 mmHg, Heart Rate: 87, Temp: 97.9 F (36.6 C), Resp Rate: 16, SpO2: 100 %, Weight: 50.2 kg     Physical Exam   Constitutional: He appears well-developed. No distress.   Musculoskeletal: Normal range of motion. He exhibits tenderness.   Right shoulder tenderness  Lumbar paraspinal tenderness   Skin: Skin is warm and dry. No erythema.   Nursing note and vitals reviewed.        MDM and ED Course     ED Medication Orders     None             MDM  Number of Diagnoses or Management Options  Acute exacerbation of chronic low back pain: established and worsening  Risk of Complications, Morbidity, and/or Mortality  Presenting problems: minimal  Management options: minimal    Patient Progress  Patient progress: stable            Procedures    Clinical Impression & Disposition     Clinical Impression  Final diagnoses:   Acute exacerbation of chronic low back pain        ED Disposition     Discharge [name] discharge to home/self care.    Condition at disposition: Stable             New Prescriptions    OXYCODONE-ACETAMINOPHEN (PERCOCET) 5-325 MG PER TABLET    Take 1-2 tablets by mouth every 6 (six) hours as needed for Pain.                    Blanca Friend, MD  05/18/15 [phone]

## 2015-05-16 NOTE — Discharge Instructions (Signed)
Back Pain [Acute Or Chronic]    Back pain is usually caused by an injury to the muscles or ligaments of the spine. Sometimes the disks that separate each bone in the spine may bulge and cause pain by pressing on a nearby nerve. Back pain may also appear after a sudden twisting/bending force (such as in a car accident), after a simple awkward movement, or lifting something heavy with poor body positioning. In either case, muscle spasm is often present and adds to the pain.  Acute back pain usually gets better in one to two weeks. Back pain related to disk disease, arthritis in the spinal joints or spinal stenosis (narrowing of the spinal canal) can become chronic and last for months or years.  Unless you had a physical injury (for example, a car accident or fall) X-rays are usually not ordered for the initial evaluation of back pain. If pain continues and does not respond to medical treatment, x-rays and other tests may be performed at a later time.  Home Care:  1. You may need to stay in bed the first few days. But, as soon as possible, begin sitting or walking to avoid problems with prolonged bed rest (muscle weakness, worsening back stiffness and pain, blood clots in the legs).  2. When in bed, try to find a position of comfort. A firm mattress is best. Try lying flat on your back with pillows under your knees. You can also try lying on your side with your knees bent up towards your chest and a pillow between your knees.  3. Avoid prolonged sitting. This puts more stress on the lower back than standing or walking.  4. During the first two days after injury, apply an ICE PACK to the painful area for 20 minutes every 2-4 hours. This will reduce swelling and pain. HEAT (hot shower, hot bath or heating pad) works well for muscle spasm. You can start with ice, then switch to heat after two days. Some patients feel best alternating ice and heat treatments. Use the one method that feels the best to you.  5. You may use  acetaminophen (Tylenol) or ibuprofen (Motrin, Advil) to control pain, unless another pain medicine was prescribed. [NOTE: If you have chronic liver or kidney disease or ever had a stomach ulcer or GI bleeding, talk with your doctor before using these medicines.]  6. Be aware of safe lifting methods and do not lift anything over 15 pounds until all the pain is gone.  Follow Up  with your doctor or this facility if your symptoms do not start to improve after one week. Physical therapy may be needed.  [NOTE: If X-rays were taken, they will be reviewed by a radiologist. You will be notified of any new findings that may affect your care.]  Get Prompt Medical Attention  if any of the following occur:   Pain becomes worse or spreads to your legs   Weakness or numbness in one or both legs   Loss of bowel or bladder control   Numbness in the groin or genital area   2000-2015 [employer] [address], [place] [address]. All rights reserved. This information is not intended as a substitute for professional medical care. Always follow your healthcare professional's instructions.

## 2015-06-01 ENCOUNTER — Ambulatory Visit: Payer: Medicare Other | Attending: Orthopaedic Surgery of the Spine

## 2015-06-01 DIAGNOSIS — Z01818 Encounter for other preprocedural examination: Secondary | ICD-10-CM | POA: Insufficient documentation

## 2015-06-01 DIAGNOSIS — M7541 Impingement syndrome of right shoulder: Secondary | ICD-10-CM | POA: Insufficient documentation

## 2015-06-01 LAB — COMPREHENSIVE METABOLIC PANEL
ALT: 61 U/L — ABNORMAL HIGH (ref 0–55)
AST (SGOT): 37 U/L (ref 10–42)
Albumin/Globulin Ratio: 1.29 Ratio (ref 0.70–1.50)
Albumin: 3.6 gm/dL (ref 3.5–5.0)
Alkaline Phosphatase: 79 U/L (ref 40–145)
Anion Gap: 17.6 mMol/L (ref 7.0–18.0)
BUN / Creatinine Ratio: 23.2 Ratio (ref 10.0–30.0)
BUN: 16 mg/dL (ref 7–22)
Bilirubin, Total: 0.4 mg/dL (ref 0.1–1.2)
CO2: 20.4 mMol/L (ref 20.0–30.0)
Calcium: 9 mg/dL (ref 8.5–10.5)
Chloride: 106 mMol/L (ref 98–110)
Creatinine: 0.69 mg/dL — ABNORMAL LOW (ref 0.80–1.30)
EGFR: >60 mL/min/{1.73_m2}
Globulin: 2.8 gm/dL (ref 2.0–4.0)
Glucose: 129 mg/dL — ABNORMAL HIGH (ref 70–99)
Osmolality Calc: 282 mOsm/kg (ref 275–300)
Potassium: 4 mMol/L (ref 3.5–5.3)
Protein, Total: 6.4 gm/dL (ref 6.0–8.3)
Sodium: 140 mMol/L (ref 136–147)

## 2015-06-01 LAB — CBC
Hematocrit: 36.7 % — ABNORMAL LOW (ref 39.0–52.5)
Hemoglobin: 12.8 gm/dL — ABNORMAL LOW (ref 13.0–17.5)
MCH: 28 pg (ref 28–35)
MCHC: 35 gm/dL (ref 32–36)
MCV: 81 fL (ref 80–100)
MPV: 9 fL (ref 6.0–10.0)
PLT CT: 184 10*3/uL (ref 130–440)
RBC: 4.52 10*6/uL (ref 4.00–5.70)
RDW: 13.7 % (ref 11.0–14.0)
WBC: 5.4 10*3/uL (ref 4.0–11.0)

## 2015-06-01 NOTE — H&P (Signed)
ADMISSION HISTORY AND PHYSICAL EXAM    Date Time: 06/01/2015 2:18 PM  Patient Name: [name],[name]  Attending Physician: Hendricks Milo, MD  Primary Care Provider:  Esmond Harps, NP        Assessment:   Right shoulder impingement syndrome    Plan:   Patient is scheduled to undergo a right shoulder arthroscopy, subacromial decompression, and biceps tenotomy by Dr. Blase Mess on 06/04/2015.    History of Present Illness:   [name] [name] is a pleasant 54 y.o. male suffering from right shoulder pain for 5 years. Pain has gotten progressively worse. He has limited range of motion of the right shoulder. He has trouble using his right arm for his activities of daily living due to the pain. He has failed conservative treatments such as interarticular steroid injection, activity modification, NSAIDs, and narcotic pain medication. Despite conservative treatments, he continues to suffer from 6/10 pain daily. He presents today for a preop history and physical in anticipation of having the above-named surgery.    Past Medical History:     Past Medical History   Diagnosis Date   . Arthritis    . Muscle pain      torn bicep    . Tobacco dependence    . Chronic shoulder pain, right    . Chronic back pain    . Low back pain    . Difficulty walking    . Cerebral palsy        Past Surgical History:     Past Surgical History   Procedure Laterality Date   . Tracheostomy     . Leg surgery       left leg surgery as child from CP   . Fracture surgery       hip surgery    . Hernia repair     . Fracture surgery     . Laminectomy, posterior lumbar, decompression, fusion level 1 N/A 01/03/2015     Procedure: LAMINECTOMY, POSTERIOR LUMBAR, DECOMPRESSION, FUSION LEVEL 1;  Surgeon: Haskel Khan, MD;  Location: [facility];  Service: Neurosurgery;  Laterality: N/A;  L5-S1 PLIF       Family History:   Mother- alive at 56 with a history of osteoarthritis.  Father- died at 47 from esophageal cancer      Social History:   Tobacco- he currently  smokes a half a pack of cigarettes per day and has done so for the last 30 years.  ETOH- denies  Illicit Drugs- denies  Married- yes  Children- no  Occupation- prep cook  His wife will be available to help him after surgery.      Allergies:     Allergies   Allergen Reactions   . Codeine Nausea And Vomiting   . Flexeril [Cyclobenzaprine]    . Naprosyn [Naproxen]    . Toradol [Ketorolac Tromethamine] Nausea And Vomiting   . Tramadol        Medications:     Current/Home Medications    HYDROCODONE-ACETAMINOPHEN (NORCO) 5-325 MG PER TABLET    Take by mouth every 4 (four) hours as needed.       IBUPROFEN (ADVIL,MOTRIN) 800 MG TABLET    Take 400 mg by mouth every 6 (six) hours as needed.       MELOXICAM (MOBIC) 15 MG TABLET    Take 15 mg by mouth daily.          Review of Systems:   A comprehensive review of systems was:  INFECTIOUS DISEASE:   There is no history of MSSA, MRSA, or VRE infections.      ANESTHESIA REVIEW:    There is no personal or family history of anesthesia problems in the past. Patient can achieve 5-6 METs of activity.    SLEEP APNEA:    There is no history of sleep apnea, pt does not snore, and denies gasping for breath while asleep.     GENERAL:  There has not been weight gain or loss of >10# in the last six months.   Pt denies fever, chills, or excessive fatigue.    SKIN:  Denies open sores, skin lesions or rashes.  HEENT:  Denies dental pain, bleeding gums, headache, visual changes, dizziness, nosebleeds,  sore throat.  RESPIRATORY:  Denies cough, dyspnea, wheezing or hemoptysis. There have been no PFTs in the last five years.  CARDIOVASCULAR:  Denies chest pain, palpitations or syncope. Stress test from May 2015 was read as normal.  ENDOCRINE:  Denies hx of thyroid disease, diabetes, heat or cold intolerance, polyuria, polydipsia or polyphagia.   GASTROINTESTINAL:  Denies nausea, vomiting, diarrhea, constipation, abdominal pain, dysphagia, gastric reflux, melena or hematochezia.  GENITOURINARY:   Denies dysuria, frequency, hematuria, urgency, nocturia, urinary incontinence, hx of urolithiasis or hx of recurrent urinary tract infections.   HEMATOLOGICAL:  Denies bleeding disorders, blood clots, blood transfusions  MUSCULOSKELETAL:  Positive for right shoulder pain and decreased range of motion.  NEUROLOGICAL:  Denies seizures, strokes. Positive for intermittent numbness in the left arm and left leg.  PSYCHOLOGICAL:  Denies depression, anxiety or difficulty with memory.  ONCOLOGICAL:  Denies hx of cancer     Physical Exam:   No data found.      There is no weight on file to calculate BMI.        Mental status - alert, oriented to person, place, and time  HEENT - Head is atraumatic and normocephalic, Pupils are PERRLA, EOMs intact. Neck is supple, trachea midline. No JVD, thyromegaly, or carotid bruits.   Lymphatics - not examined  Lungs - clear to auscultation bilat, no wheezes, rales or rhonchi.   Heart - normal rate, regular rhythm, normal S1, S2, no murmurs, rubs, clicks or gallops  Abdomen - soft, nontender, nondistended, normoactive bowel sounds.  GU exam deferred  Extremities - 2+ bilat radial pulses, 2+ bilat posterior tibial pulses, without clubbing, cyanosis, or edema. The left leg is shorter than the right leg.  Neurological - Cranial nerves II-XII intact, strength 5/5 bilat in upper and lower extremities, sensation intact to light touch globally.   Skin -  skin over the right shoulder is warm, dry, intact and clear of any scratches, excoriations, lesions or rashes.        Signed by:  Paulla Dolly III, CFNP

## 2015-06-01 NOTE — Pre-Procedure Instructions (Signed)
Patient had stress test 02/2014 in epic, denies any other cardiac testing or PFT's in past 5 years.

## 2015-06-04 ENCOUNTER — Ambulatory Visit: Payer: Medicare Other | Admitting: Orthopaedic Surgery of the Spine

## 2015-06-04 ENCOUNTER — Ambulatory Visit: Payer: Medicare Other | Admitting: Anesthesiology

## 2015-06-04 ENCOUNTER — Ambulatory Visit
Admission: RE | Admit: 2015-06-04 | Discharge: 2015-06-04 | Disposition: A | Discharge status: Home / Self Care | Payer: Medicare Other | Attending: Orthopaedic Surgery of the Spine | Admitting: Orthopaedic Surgery of the Spine

## 2015-06-04 ENCOUNTER — Encounter
Admission: RE | Disposition: A | Discharge status: Home / Self Care | Payer: Self-pay | Source: Home / Self Care | Attending: Orthopaedic Surgery of the Spine

## 2015-06-04 DIAGNOSIS — Z8261 Family history of arthritis: Secondary | ICD-10-CM | POA: Insufficient documentation

## 2015-06-04 DIAGNOSIS — M1990 Unspecified osteoarthritis, unspecified site: Secondary | ICD-10-CM | POA: Insufficient documentation

## 2015-06-04 DIAGNOSIS — Z8781 Personal history of (healed) traumatic fracture: Secondary | ICD-10-CM | POA: Insufficient documentation

## 2015-06-04 DIAGNOSIS — F17210 Nicotine dependence, cigarettes, uncomplicated: Secondary | ICD-10-CM | POA: Insufficient documentation

## 2015-06-04 DIAGNOSIS — G809 Cerebral palsy, unspecified: Secondary | ICD-10-CM | POA: Insufficient documentation

## 2015-06-04 DIAGNOSIS — Z930 Tracheostomy status: Secondary | ICD-10-CM | POA: Insufficient documentation

## 2015-06-04 DIAGNOSIS — Z888 Allergy status to other drugs, medicaments and biological substances status: Secondary | ICD-10-CM | POA: Insufficient documentation

## 2015-06-04 DIAGNOSIS — Z791 Long term (current) use of non-steroidal anti-inflammatories (NSAID): Secondary | ICD-10-CM | POA: Insufficient documentation

## 2015-06-04 DIAGNOSIS — Z981 Arthrodesis status: Secondary | ICD-10-CM | POA: Insufficient documentation

## 2015-06-04 DIAGNOSIS — M7521 Bicipital tendinitis, right shoulder: Secondary | ICD-10-CM | POA: Insufficient documentation

## 2015-06-04 DIAGNOSIS — M7541 Impingement syndrome of right shoulder: Secondary | ICD-10-CM | POA: Insufficient documentation

## 2015-06-04 DIAGNOSIS — Z800 Family history of malignant neoplasm of digestive organs: Secondary | ICD-10-CM | POA: Insufficient documentation

## 2015-06-04 DIAGNOSIS — Z886 Allergy status to analgesic agent status: Secondary | ICD-10-CM | POA: Insufficient documentation

## 2015-06-04 DIAGNOSIS — Z79891 Long term (current) use of opiate analgesic: Secondary | ICD-10-CM | POA: Insufficient documentation

## 2015-06-04 DIAGNOSIS — M545 Low back pain: Secondary | ICD-10-CM | POA: Insufficient documentation

## 2015-06-04 DIAGNOSIS — Z885 Allergy status to narcotic agent status: Secondary | ICD-10-CM | POA: Insufficient documentation

## 2015-06-04 HISTORY — PX: ARTHROSCOPY SHOULDER & BICEPS TENOTOMY: SHX51043

## 2015-06-04 SURGERY — ARTHROSCOPY, SHOULDER, BICEPS TENOTOMY
Anesthesia: Anesthesia General | Site: Shoulder | Laterality: Right | Wound class: Clean

## 2015-06-04 MED ORDER — NEOSTIGMINE METHYLSULFATE 0.5 MG/ML IJ SOLN
INTRAMUSCULAR | Status: DC | PRN
Start: 2015-06-04 — End: 2015-06-04
  Administered 2015-06-04: 2 mg via INTRAVENOUS

## 2015-06-04 MED ORDER — LACTATED RINGERS IV SOLN
INTRAVENOUS | Status: DC | PRN
Start: 2015-06-04 — End: 2015-06-04

## 2015-06-04 MED ORDER — ROCURONIUM BROMIDE 50 MG/5ML IV SOLN
INTRAVENOUS | Status: DC | PRN
Start: 2015-06-04 — End: 2015-06-04
  Administered 2015-06-04: 30 mg via INTRAVENOUS
  Administered 2015-06-04: 10 mg via INTRAVENOUS

## 2015-06-04 MED ORDER — VH HYDROMORPHONE HCL PF 1 MG/ML CARPUJECT
0.3000 mg | INTRAMUSCULAR | Status: DC | PRN
Start: 2015-06-04 — End: 2015-06-04

## 2015-06-04 MED ORDER — VALLEY PROMETHAZINE 50 MG/0.4 ML TOPICAL GEL UD (RPKG)
12.5000 mg | Freq: Once | TOPICAL | Status: DC
Start: 2015-06-04 — End: 2015-06-04

## 2015-06-04 MED ORDER — EPINEPHRINE 1 MG/ML IJ SOLN
INTRAMUSCULAR | Status: DC | PRN
Start: 2015-06-04 — End: 2015-06-04
  Administered 2015-06-04: 2 mg

## 2015-06-04 MED ORDER — GLYCOPYRROLATE 0.2 MG/ML IJ SOLN
INTRAMUSCULAR | Status: DC | PRN
Start: 2015-06-04 — End: 2015-06-04
  Administered 2015-06-04: .3 mg via INTRAVENOUS
  Administered 2015-06-04: 0.1 mg via INTRAVENOUS

## 2015-06-04 MED ORDER — ROPIVACAINE HCL 5 MG/ML IJ SOLN
INTRAMUSCULAR | Status: DC | PRN
Start: 2015-06-04 — End: 2015-06-04
  Administered 2015-06-04: 30 mL via PERINEURAL

## 2015-06-04 MED ORDER — OXYCODONE-ACETAMINOPHEN 5-325 MG PO TABS
1.0000 | ORAL_TABLET | ORAL | Status: DC | PRN
Start: 2015-06-04 — End: 2015-06-04
  Filled 2015-06-04: qty 2

## 2015-06-04 MED ORDER — ROPIVACAINE HCL 5 MG/ML IJ SOLN
INTRAMUSCULAR | Status: AC
Start: 2015-06-04 — End: ?
  Filled 2015-06-04: qty 30

## 2015-06-04 MED ORDER — MIDAZOLAM HCL 2 MG/2ML IJ SOLN
INTRAMUSCULAR | Status: DC | PRN
Start: 2015-06-04 — End: 2015-06-04
  Administered 2015-06-04: 2 mg via INTRAVENOUS

## 2015-06-04 MED ORDER — ONDANSETRON HCL 4 MG/2ML IJ SOLN
4.0000 mg | Freq: Once | INTRAMUSCULAR | Status: DC | PRN
Start: 2015-06-04 — End: 2015-06-04

## 2015-06-04 MED ORDER — PROPOFOL 10 MG/ML IV EMUL (WRAP)
INTRAVENOUS | Status: DC | PRN
Start: 2015-06-04 — End: 2015-06-04
  Administered 2015-06-04: 100 mg via INTRAVENOUS
  Administered 2015-06-04: 50 mg via INTRAVENOUS
  Administered 2015-06-04: 30 mg via INTRAVENOUS
  Administered 2015-06-04: 20 mg via INTRAVENOUS

## 2015-06-04 MED ORDER — VH HYDROMORPHONE HCL PF 1 MG/ML CARPUJECT
0.5000 mg | INTRAMUSCULAR | Status: DC | PRN
Start: 2015-06-04 — End: 2015-06-04

## 2015-06-04 MED ORDER — PROPOFOL 10 MG/ML IV EMUL (WRAP)
INTRAVENOUS | Status: AC
Start: 2015-06-04 — End: ?
  Filled 2015-06-04: qty 20

## 2015-06-04 MED ORDER — ACETAMINOPHEN 10 MG/ML IV SOLN
INTRAVENOUS | Status: AC
Start: 2015-06-04 — End: ?
  Filled 2015-06-04: qty 100

## 2015-06-04 MED ORDER — EPHEDRINE SULFATE 50 MG/ML IJ SOLN
INTRAMUSCULAR | Status: DC | PRN
Start: 2015-06-04 — End: 2015-06-04
  Administered 2015-06-04 (×4): 10 mg via INTRAVENOUS

## 2015-06-04 MED ORDER — LIDOCAINE HCL (PF) 1 % IJ SOLN
INTRAMUSCULAR | Status: AC
Start: 2015-06-04 — End: ?
  Filled 2015-06-04: qty 5

## 2015-06-04 MED ORDER — MIDAZOLAM HCL 2 MG/2ML IJ SOLN
INTRAMUSCULAR | Status: AC
Start: 2015-06-04 — End: ?
  Filled 2015-06-04: qty 2

## 2015-06-04 MED ORDER — FENTANYL CITRATE (PF) 50 MCG/ML IJ SOLN (WRAP)
INTRAMUSCULAR | Status: AC
Start: 2015-06-04 — End: ?
  Filled 2015-06-04: qty 2

## 2015-06-04 MED ORDER — VH PHENYLEPHRINE INFUSION 20 MG/250 ML (SIMPLE)
INTRAVENOUS | Status: DC | PRN
Start: 2015-06-04 — End: 2015-06-04
  Administered 2015-06-04: 80 ug via INTRAVENOUS
  Administered 2015-06-04 (×2): 160 ug via INTRAVENOUS
  Administered 2015-06-04: 80 ug via INTRAVENOUS
  Administered 2015-06-04 (×2): 160 ug via INTRAVENOUS

## 2015-06-04 MED ORDER — EPINEPHRINE 1 MG/ML IJ SOLN
INTRAMUSCULAR | Status: AC
Start: 2015-06-04 — End: ?
  Filled 2015-06-04: qty 30

## 2015-06-04 MED ORDER — VH PHENYLEPHRINE INFUSION 20 MG/250 ML (SIMPLE)
INTRAVENOUS | Status: DC | PRN
Start: 2015-06-04 — End: 2015-06-04
  Administered 2015-06-04: 100 ug/min via INTRAVENOUS

## 2015-06-04 MED ORDER — CEFAZOLIN SODIUM-DEXTROSE 2-3 GM-% IV SOLR
2.0000 g | INTRAVENOUS | Status: AC
Start: 2015-06-04 — End: 2015-06-04
  Administered 2015-06-04: 2 g via INTRAVENOUS
  Filled 2015-06-04: qty 50

## 2015-06-04 MED ORDER — FENTANYL CITRATE (PF) 50 MCG/ML IJ SOLN (WRAP)
25.0000 ug | INTRAMUSCULAR | Status: DC | PRN
Start: 2015-06-04 — End: 2015-06-04

## 2015-06-04 MED ORDER — LACTATED RINGERS IV SOLN
INTRAVENOUS | Status: DC
Start: 2015-06-04 — End: 2015-06-04

## 2015-06-04 MED ORDER — FENTANYL CITRATE (PF) 50 MCG/ML IJ SOLN (WRAP)
INTRAMUSCULAR | Status: DC | PRN
Start: 2015-06-04 — End: 2015-06-04
  Administered 2015-06-04 (×2): 50 ug via INTRAVENOUS

## 2015-06-04 MED ORDER — VH PHENYLEPHRINE INFUSION 20 MG/250 ML (SIMPLE)
INTRAVENOUS | Status: AC
Start: 2015-06-04 — End: ?
  Filled 2015-06-04: qty 250

## 2015-06-04 MED ORDER — ONDANSETRON HCL 4 MG/2ML IJ SOLN
INTRAMUSCULAR | Status: DC | PRN
Start: 2015-06-04 — End: 2015-06-04
  Administered 2015-06-04: 4 mg via INTRAVENOUS

## 2015-06-04 SURGICAL SUPPLY — 38 items
BLADE 4.0MM ACROMIOBLASTER (Supply) ×2 IMPLANT
BLADE FULL RADIUS 4.5 YEL (Supply) ×2 IMPLANT
CHLORAPREP W/ORANGE TINT 26ML (Supply) ×4 IMPLANT
CIRCUIT ADULT ULTRAFLEX LF (Supply) ×2 IMPLANT
DRAPE U (Supply) ×2 IMPLANT
FILTER NEPTUNE 4 PORT (Supply) ×2 IMPLANT
GLOVE BIOGEL UND PI IND SZ 8 (Supply) ×2 IMPLANT
GLOVE SURGEON SYN PF SZ 7.5 (Supply) ×2 IMPLANT
GOWN IMPERV LG W/TOWEL REUS (Supply) ×2 IMPLANT
GOWN WARMING  STANDARD BAIRPAW (Supply) ×2 IMPLANT
GOWN XLARGE #2708 (X-PROT) (Supply) ×2 IMPLANT
NDL 18GA X 1.5 SAFETYGLIDE (Supply) IMPLANT
OUTFLOW DUALWAVE ARTHREX (Supply) ×2 IMPLANT
PACK SURGI-SHOULDER-LF (Supply) ×2 IMPLANT
PAD ABD STERILE 8 X 10 (Dressings) ×2 IMPLANT
PAD FLOOR HEAVY  PIG MAT (Supply) ×8 IMPLANT
RESTRAINT ALLEN UNIVERSAL HEAD (Supply) ×2 IMPLANT
SCRUB DYNA-HEX 4% CHG (Supply) ×2 IMPLANT
SHOULDER IMMOBILIZER SLING MED (Supply) ×2 IMPLANT
SLING ARM LARGE (Supply) IMPLANT
SLING ARM MEDIUM EA (Supply) IMPLANT
SOL SALINE IRRIG 3000ML (Supply) ×8 IMPLANT
SOL SALINE IRRIG 500ML (Solutions) ×2 IMPLANT
SPONGE GAUZE 4 X 4 12 PLY (Dressings) ×2 IMPLANT
STERISTRIP 1/2 IN X 4 IN (Supply) ×2 IMPLANT
STOCKINETTE ROLL 4IN (Dressings) ×2 IMPLANT
SUCTION BLACK HOLE (Supply) ×2 IMPLANT
SUT MONO PLUS 4-0 PS2  MCP426H (Supply) ×2 IMPLANT
SYRINGE 30CC LL WIDE FLANGE (Supply) IMPLANT
SYRINGE 3CC 21GA X 1.5 IN (Supply) ×2 IMPLANT
TAPE MEDIPORE 4"X10YDS CLOTH (Supply) ×2 IMPLANT
TOWEL (FAN-FOLD) 6/PK (Supply) ×4 IMPLANT
TRAY SKIN PREP  DRY (Supply) ×2 IMPLANT
TUBING REDEUCE PATIENT ARTHREX (Supply) ×2 IMPLANT
TUBING REDEUCE PUMP ARTHREX (Supply) ×2 IMPLANT
TUBING Y ADAPTOR ARTHREX (Supply) ×2 IMPLANT
UNDERPAD BLUE 23X36  LF (Supply) ×2 IMPLANT
WAND TURBOVAC 90 DEGREE STR (Supply) IMPLANT

## 2015-06-04 NOTE — Discharge Summary (Signed)
ADMITTING DIAGNOSES: Impingement syndrome and biceps tendonitis,  right shoulder.    POSTOPERATIVE DIAGNOSES: Impingement syndrome and biceps  tendonitis, right shoulder.    OPERATIVE PROCEDURES: Arthroscopy, right shoulder with  subacromial decompression, and biceps tenotomy.    COMPLICATIONS: None.    CONSULTATIONS: None.    NARRATIVE SUMMARY: This is a 54 year old black male.  History  and physical on the chart.  He had the above-named procedure.  Postoperatively, he had no problems and was discharged home the  same day.    CONDITION ON DISCHARGE: Good.    DISCHARGE INSTRUCTIONS: Include activity as tolerated.  Keep the  wounds clean and dry.  He can change the dressing in two days  and shower.  He was given a prescription for Percocet 5, 100,  without refill, taken one or two, four times a day as needed for  pain.  I want to see him back in the office in about two weeks.  If any problems arise, he is to call.        [id number]  DD: 06/04/2015 09:25:40  DT: 06/04/2015 60:45:40  JOB: [id number]

## 2015-06-04 NOTE — Anesthesia Postprocedure Evaluation (Signed)
Anesthesia Post Evaluation    Patient: [name] [name]    Procedures performed: Procedure(s):  ARTHROSCOPY SHOULDER & BICEPS TENOTOMY    Anesthesia type: General ETT    Patient location:Phase I PACU    Last vitals:   Filed Vitals:    06/04/15 1015   BP: 112/65   Pulse: 68   Temp: 36 C (96.8 F)   Resp: 18   SpO2: 96%       Post pain: Patient not complaining of pain, continue current therapy      Mental Status:awake    Respiratory Function: tolerating room air    Cardiovascular: stable    Nausea/Vomiting: patient not complaining of nausea or vomiting    Hydration Status: adequate    Post assessment: no apparent anesthetic complications

## 2015-06-04 NOTE — Anesthesia Procedure Notes (Signed)
Peripheral  Patient location during procedure: pre-op  Reason for block: Post-op pain managment  Injection technique: single-shot  Block Region: Interscalene  Laterality: Right  Block at surgeon's request Yes    Staffing  Anesthesiologist: Otis Brace S  Performed by: Anesthesiologist     Pre-procedure Checklist   Completed: patient identified, surgical consent, pre-op evaluation, timeout performed, risks and benefits discussed, anesthesia consent given and correct site      Peripheral Block  Patient monitoring: pulse oximetry and NIBP  Patient position: sitting  Sterile Technique: Chloraprep  Premedication: Yes  Local infiltration: lidocaine 1%    Needle  Needle type: Stimuplex   Needle gauge: 22 G  Needle length: 2 in    Procedures: ultrasound guided  Ultrasound Guided: LA spread visualized, Needle visualized, Relevant anatomy identified (nerve, vessels, muscle) and Image stored or printed      Assessment   Incremental injection: yes  Injection made incrementally with aspirations every 5 mL.  Injection Resistance: no  Paresthesia Pain: no    Blood Aspirated: No  no suspected intravascular injection  Patient tolerated procedure well: Yes  Block Outcome: patient tolerated procedure well, no complications and successful block  Additional Notes  30 mL of 0.5% Ropivacaine

## 2015-06-04 NOTE — Op Note (Signed)
Date: 06/04/2015  PREOPERATIVE DIAGNOSES: Impingement syndrome, right shoulder and  biceps tendonitis, right shoulder.    POSTOPERATIVE DIAGNOSES: Impingement syndrome, right shoulder  and biceps tendonitis, right shoulder.    OPERATIVE PROCEDURES: Arthroscopy, right shoulder, subacromial  decompression, and biceps tenotomy, right shoulder.    SURGEON: Hendricks Milo, MD    ASSISTANTDian Situ.    ANESTHESIA: Interscalene block with general endotracheal.    ESTIMATED BLOOD LOSS: 5 cc.    REPLACEMENT: Was 800 cc crystalloid.    DISCUSSION: A considerable amount of time was spent on this  procedure because as soon as I got into the shoulder joint  itself, the glenohumeral joint, there was blood that came out of  the cannula.  The first 20 to 30 minutes of this procedure were  spent irrigating the shoulder in order to gain visualization of  the glenohumeral joint.  The pump was used and we ended up using  epinephrine as well and I actually made a separate portal just  to aid in evacuation of the shoulder.  Ultimately, it was  cleared up enough down I could visualize the shoulder.  At one  point, I actually pulled the x-ray machine in thinking that  maybe the patient had an acute fracture though clinically, I did  not suspect based on how the shoulder moved and the like and  ultimately after finally irrigating the shoulder and gaining  visualization, I do not think that fracture is a likely problem.  I did talk to his significant other who said that he did have a  recent injury at work where he was doing some heavy lifting and  his shoulder became much worse and this occurred sometime this  past week.    DESCRIPTION OF PROCEDURE: After obtaining satisfactory  interscalene block and general anesthesia, the patient was  placed in the beach chair position.  All bony prominences well  padded.  Shoulder was prepped and draped in sterile fashion.  Following this, I made the posterior portal and introduced the  scope into  the glenohumeral joint and the difficulty that I  encountered I have already discussed.  Once I had everything  completely cleared, I was able to visualize the shoulder and the  biceps tendon was literally hanging on by a thread.  I completed  the tenotomy of the biceps tendon to the separate portal that I  established to irrigate the shoulder.  Following this, it was  noted that the cartilage of the glenoid was somewhat yellowed  but otherwise intact.  The labrum had a few areas of  insignificant fraying that I debrided with a full-radius  resector but I doubt that they were causing any symptoms in the  glenoid labrum and it was pretty much intact throughout.  The  humeral head was unremarkable and starting in the area of the  transverse where the biceps had been, I traced the rotator cuff  around to the bald spot and there were no defects whatsoever in  the rotator cuff.  The joint side of the cuff looked fine.  At  this point, I withdrew the scope and placed in the subacromial  space, established a lateral portal, and then debrided the  synovium.  The impingement was largely medial close to the [facility]  joint.  Little prominence of the distal clavicle as well but the  [facility] joint was not very degenerative on this CT scan, and the  patient was minimally tender in that area preoperatively.  Once  I had the synovium cleared away, I defined the edges of the  anterior acromion with the ArthroCare device and then did the  acromioplasty with the bur transforming it to a "type 1"  acromion.  I did take off a little bit of the undersurface of  the clavicle as well as the acromion and then turned the burr  rounds so the sheath was underneath the acromion and the entire  surface was parallel to the sheath that completed the procedure.  The instrumentation was withdrawn.  The portals were closed with  4-0 Monocryl and a dry sterile dressing was applied.  The  patient was placed in a sling and taken to recovery room in  good  condition.        [id number]  DD: 06/04/2015 09:20:39  DT: 06/04/2015 10:18:07  JOB: [id number]

## 2015-06-04 NOTE — OR PostOp (Signed)
Patient states, "Ready to go home." Denies nausea, pain tolerable, VSS,  Tolerates PO fluids and activity.

## 2015-06-04 NOTE — Transfer of Care (Signed)
Anesthesia Transfer of Care Note    Patient: [name] [name]    Last vitals:   Filed Vitals:    06/04/15 0932   BP: 132/79   Pulse: 104   Temp: 35.6 C (96.1 F)   Resp: 23   SpO2: 100%       Oxygen: Nasal Cannula     Mental Status:awake and alert     Airway: Natural    Cardiovascular Status:  stable

## 2015-06-04 NOTE — Anesthesia Preprocedure Evaluation (Signed)
Anesthesia Evaluation    AIRWAY    Mallampati: II    TM distance: >3 FB  Neck ROM: full  Mouth Opening:full   CARDIOVASCULAR    regular and normal       DENTAL           PULMONARY    clear to auscultation     OTHER FINDINGS                      Anesthesia Plan    ASA 2     general               (Can achieve greater than 4 METS.  Denies CP/DOE.  Had negative stress test last year.    Has moderate to severe pain at rest and with ROM in right shoulder but denies any numbness or weakness in right arm.)      intravenous induction   Detailed anesthesia plan: general endotracheal        Post op pain management: PNB single shot    informed consent obtained    ECG reviewed  pertinent labs reviewed

## 2015-06-04 NOTE — Brief Op Note (Signed)
BRIEF OP NOTE    Date Time: 06/04/2015 9:17 AM    Patient Name:   [name] [name]    Date of Operation:   06/04/2015    Providers Performing:   Surgeon(s):  Blase Mess, Charlann Noss, MD    Assistant (s):   H Crittenden OPAC    Operative Procedure:   Procedure(s):  ARTHROSCOPY SHOULDER & BICEPS TENOTOMY    Preoperative Diagnosis:   Pre-Op Diagnosis Codes:     * Impingement syndrome of shoulder, right [M75.41]    Postoperative Diagnosis:   Post-Op Diagnosis Codes:     * Impingement syndrome of shoulder, right [M75.41]    Anesthesia:   Combo    Estimated Blood Loss:   5 mL       Implants:   * No implants in log *    Drains:   Drains:  No    Specimens:        SPECIMENS (last 24 hours)      Pathology Specimens       06/04/15 0706             Specimen Information    Specimen Testing Required None per surgeon           Findings:     See post op diagnosis    Complications:   none      Signed by: Hendricks Milo, MD                                                                           [facility]

## 2015-06-04 NOTE — Discharge Instructions (Signed)
[facility]  DISCHARGE INSTRUCTIONS FOR SHOULDER SURGERY      POST ANESTHESIA:  . Although you will be awake and alert in the Recovery Room, small amounts of sedation will remain in your body for at least 24 hours, and you may feel tired and sleepy for the next 24 hours. You are advised to go directly home, take it easy and rest as much as possible.  . It is advisable to have someone with you at home for the remainder of the day.  . Children should not be left unattended for the next 24 hours.      FOR THE NEXT 24 HOURS:  . DO NOT operate a motor vehicle or any mechanical or electrical equipment.  . DO NOT make important decisions or sign legal documents.  . DO NOT consume alcohol, tranquilizers, sleeping medications, or any non-prescribed medication.    DIET:  . Begin with liquids and progress to soft food. Progress to your normal diet if not nauseated. Nausea and vomiting may occur in the first 24 hrs.    ACTIVITY:  SLING: Wear as needed for comfort      Dispense the Following shoulder exercises  No restrictions    MEDICATIONS:  . When taking pain medications, you may experience dizziness or drowsiness.  Do not drive when you are taking these medications.  Remember, pain medications may cause constipation.  . You may begin taking your pain medication at anytime.    DRESSINGS AND WOUND CARE:   ? Leave dressing on and keep it clean and dry: remove in  2 Days   ? Leave any steri-strips (plastic strips), sutures/staples, or any bonding "glue" that may have been used to protect your incision in place.  ? When you are allowed to shower, wash gently over incision, pat dry and leave uncovered.   Please do not apply ointments, unless otherwise instructed by your doctor.  Marland Kitchen Apply ice to your shoulder for 20 minutes every hour (while you are awake) for the next 24 to 48 hours.   . You may want to sleep in a recliner or semi-sitting position for comfort.    . IF  YOU HAVE ANY OF THE FOLLOWING, PLEASE CONTACT  YOUR PHYSICIAN:         1. Pain not controlled by the medication prescribed for you.         2. Unexplained fever.         3. Bleeding greater than spots from affected area.         4. Increased redness, warmth, hardness around the operative area.         5. Change in sensation, color or movement of affected extremity.         6. Other unexplained symptoms, problems or concerns.         7. If you experience redness or swelling at the IV insertion site that continues or worsens, please have it evaluated by your doctor.      The [facility] staff who cared for you today would like to wish you a quick and comfortable recovery.  For any questions or concerns about the care you received, please contact us at [phone] Monday-Friday from 8:30-5:00pm.  Please direct any medical questions to your surgeon.    IF YOU NEED IMMEDIATE ATTENTION AND YOUR SURGEON IS NOT AVAILABLE , COME TO [facility] EMERGENCY DEPARTMENT OR CALL [phone].

## 2015-06-04 NOTE — Interval H&P Note (Signed)
I have no additions to the history and there are no new physical findings.  This history and physical reflects the current condition of this patient.  There is no change in the surgical plan.  I have once again discussed the procedure as well as the potential benefits and risks associated with the procedure.  This patient requests to proceed with the planned procedure.     I have reviewed the H&P, examined the patient and there are no changes.

## 2015-06-06 ENCOUNTER — Encounter: Payer: Self-pay | Admitting: Orthopaedic Surgery of the Spine

## 2015-07-03 ENCOUNTER — Other Ambulatory Visit: Payer: Self-pay | Admitting: Pain Medicine

## 2015-07-03 ENCOUNTER — Ambulatory Visit
Admission: RE | Admit: 2015-07-03 | Discharge: 2015-07-03 | Disposition: A | Discharge status: Home / Self Care | Payer: Medicare Other | Source: Ambulatory Visit | Attending: Pain Medicine | Admitting: Pain Medicine

## 2015-07-03 DIAGNOSIS — M2170 Unequal limb length (acquired), unspecified site: Secondary | ICD-10-CM | POA: Insufficient documentation

## 2015-07-11 ENCOUNTER — Other Ambulatory Visit: Payer: Self-pay | Admitting: Nurse Practitioner

## 2015-07-11 DIAGNOSIS — M4317 Spondylolisthesis, lumbosacral region: Secondary | ICD-10-CM

## 2015-07-12 ENCOUNTER — Ambulatory Visit
Admission: RE | Admit: 2015-07-12 | Discharge: 2015-07-12 | Disposition: A | Discharge status: Home / Self Care | Payer: Medicare Other | Source: Ambulatory Visit | Attending: Nurse Practitioner | Admitting: Nurse Practitioner

## 2015-07-12 DIAGNOSIS — M4317 Spondylolisthesis, lumbosacral region: Secondary | ICD-10-CM

## 2015-07-12 DIAGNOSIS — M47892 Other spondylosis, cervical region: Secondary | ICD-10-CM | POA: Insufficient documentation

## 2015-07-12 DIAGNOSIS — M5031 Other cervical disc degeneration,  high cervical region: Secondary | ICD-10-CM | POA: Insufficient documentation

## 2016-01-21 ENCOUNTER — Emergency Department: Payer: Medicare Other

## 2016-01-21 ENCOUNTER — Observation Stay: Payer: Medicare Other | Admitting: Emergency Medicine

## 2016-01-21 ENCOUNTER — Observation Stay
Admission: EM | Admit: 2016-01-21 | Discharge: 2016-01-22 | Disposition: A | Discharge status: Home / Self Care | Payer: Medicare Other | Attending: Emergency Medicine | Admitting: Emergency Medicine

## 2016-01-21 DIAGNOSIS — M549 Dorsalgia, unspecified: Secondary | ICD-10-CM | POA: Insufficient documentation

## 2016-01-21 DIAGNOSIS — Z885 Allergy status to narcotic agent status: Secondary | ICD-10-CM | POA: Insufficient documentation

## 2016-01-21 DIAGNOSIS — Z8249 Family history of ischemic heart disease and other diseases of the circulatory system: Secondary | ICD-10-CM | POA: Insufficient documentation

## 2016-01-21 DIAGNOSIS — Z886 Allergy status to analgesic agent status: Secondary | ICD-10-CM | POA: Insufficient documentation

## 2016-01-21 DIAGNOSIS — M25511 Pain in right shoulder: Secondary | ICD-10-CM | POA: Insufficient documentation

## 2016-01-21 DIAGNOSIS — Z981 Arthrodesis status: Secondary | ICD-10-CM | POA: Insufficient documentation

## 2016-01-21 DIAGNOSIS — G8929 Other chronic pain: Secondary | ICD-10-CM | POA: Insufficient documentation

## 2016-01-21 DIAGNOSIS — Z930 Tracheostomy status: Secondary | ICD-10-CM | POA: Insufficient documentation

## 2016-01-21 DIAGNOSIS — M1990 Unspecified osteoarthritis, unspecified site: Secondary | ICD-10-CM | POA: Insufficient documentation

## 2016-01-21 DIAGNOSIS — Z888 Allergy status to other drugs, medicaments and biological substances status: Secondary | ICD-10-CM | POA: Insufficient documentation

## 2016-01-21 DIAGNOSIS — R079 Chest pain, unspecified: Principal | ICD-10-CM | POA: Insufficient documentation

## 2016-01-21 DIAGNOSIS — F17210 Nicotine dependence, cigarettes, uncomplicated: Secondary | ICD-10-CM | POA: Insufficient documentation

## 2016-01-21 DIAGNOSIS — G809 Cerebral palsy, unspecified: Secondary | ICD-10-CM | POA: Insufficient documentation

## 2016-01-21 LAB — I-STAT TROPONIN: Troponin I I-Stat: <0.02 ng/mL (ref 0.00–0.02)

## 2016-01-21 LAB — I-STAT CHEM 8 CARTRIDGE
Anion Gap I-Stat: 19 — ABNORMAL HIGH (ref 7–16)
BUN I-Stat: 9 mg/dL (ref 6–20)
Calcium Ionized I-Stat: 4.6 mg/dL (ref 4.35–5.10)
Chloride I-Stat: 103 mMol/L (ref 98–112)
Creatinine I-Stat: 0.7 mg/dL — ABNORMAL LOW (ref 0.90–1.30)
EGFR: 125 mL/min/{1.73_m2} (ref 60–150)
Glucose I-Stat: 96 mg/dL (ref 70–99)
Hematocrit I-Stat: 41 % (ref 39.0–52.5)
Hemoglobin I-Stat: 13.9 gm/dL (ref 13.0–17.5)
Potassium I-Stat: 3.5 mMol/L (ref 3.5–5.3)
Sodium I-Stat: 142 mMol/L (ref 135–145)
TCO2 I-Stat: 25 mMol/L (ref 24–29)

## 2016-01-21 LAB — D-DIMER, QUANTITATIVE: D-Dimer: 0.41 mg/L FEU (ref 0.19–0.52)

## 2016-01-21 LAB — VH I-STAT CHEM 8 NOTIFICATION

## 2016-01-21 LAB — CBC AND DIFFERENTIAL
Basophils %: 1 % (ref 0.0–3.0)
Basophils Absolute: 0 10*3/uL (ref 0.0–0.3)
Eosinophils %: 3.4 % (ref 0.0–7.0)
Eosinophils Absolute: 0.1 10*3/uL (ref 0.0–0.8)
Hematocrit: 41.2 % (ref 39.0–52.5)
Hemoglobin: 13.9 gm/dL (ref 13.0–17.5)
Lymphocytes Absolute: 1.5 10*3/uL (ref 0.6–5.1)
Lymphocytes: 36.7 % (ref 15.0–46.0)
MCH: 28 pg (ref 28–35)
MCHC: 34 gm/dL (ref 32–36)
MCV: 84 fL (ref 80–100)
MPV: 9.1 fL (ref 6.0–10.0)
Monocytes Absolute: 0.4 10*3/uL (ref 0.1–1.7)
Monocytes: 9.2 % (ref 3.0–15.0)
Neutrophils %: 49.8 % (ref 42.0–78.0)
Neutrophils Absolute: 2 10*3/uL (ref 1.7–8.6)
PLT CT: 171 10*3/uL (ref 130–440)
RBC: 4.92 10*6/uL (ref 4.00–5.70)
RDW: 12.7 % (ref 11.0–14.0)
WBC: 4 10*3/uL (ref 4.0–11.0)

## 2016-01-21 LAB — ECG 12-LEAD
P Wave Axis: 62 deg
P Wave Axis: 58 deg
P-R Interval: 129 ms
P-R Interval: 129 ms
Patient Age: 55 years
Patient Age: 55 years
Q-T Interval(Corrected): 385 ms
Q-T Interval(Corrected): 382 ms
Q-T Interval: 349 ms
Q-T Interval: 367 ms
QRS Axis: 7 deg
QRS Axis: 20 deg
QRS Duration: 90 ms
QRS Duration: 90 ms
T Axis: 57 years
T Axis: 62 years
Ventricular Rate: 73 //min
Ventricular Rate: 65 //min

## 2016-01-21 LAB — TROPONIN I
Troponin I: 0.03 ng/mL — ABNORMAL HIGH (ref 0.00–0.02)
Troponin I: 0.01 ng/mL (ref 0.00–0.02)

## 2016-01-21 LAB — LIPID PANEL
Cholesterol: 174 mg/dL (ref 75–199)
Coronary Heart Disease Risk: 3.41
HDL: 51 mg/dL (ref 40–55)
LDL Calculated: 96 mg/dL
Triglycerides: 133 mg/dL (ref 10–150)
VLDL: 27 (ref 0–40)

## 2016-01-21 LAB — VH I-STAT TROPONIN NOTIFICATION

## 2016-01-21 MED ORDER — BACLOFEN 10 MG PO TABS
10.0000 mg | ORAL_TABLET | Freq: Three times a day (TID) | ORAL | Status: DC
Start: 2016-01-21 — End: 2016-01-22
  Administered 2016-01-21 – 2016-01-22 (×2): 10 mg via ORAL
  Filled 2016-01-21 (×4): qty 1

## 2016-01-21 MED ORDER — ONDANSETRON HCL 4 MG/2ML IJ SOLN
4.0000 mg | Freq: Four times a day (QID) | INTRAMUSCULAR | Status: DC | PRN
Start: 2016-01-21 — End: 2016-01-22

## 2016-01-21 MED ORDER — PREGABALIN 150 MG PO CAPS
150.0000 mg | ORAL_CAPSULE | Freq: Two times a day (BID) | ORAL | Status: DC
Start: 2016-01-21 — End: 2016-01-22
  Administered 2016-01-21 – 2016-01-22 (×2): 150 mg via ORAL
  Filled 2016-01-21 (×2): qty 1

## 2016-01-21 MED ORDER — AMITRIPTYLINE HCL 50 MG PO TABS
50.0000 mg | ORAL_TABLET | Freq: Every evening | ORAL | Status: DC | PRN
Start: 2016-01-21 — End: 2016-01-22
  Administered 2016-01-21: 50 mg via ORAL
  Filled 2016-01-21: qty 1

## 2016-01-21 MED ORDER — ASPIRIN 81 MG PO CHEW
324.0000 mg | CHEWABLE_TABLET | Freq: Once | ORAL | Status: AC
Start: 2016-01-21 — End: 2016-01-21
  Administered 2016-01-21: 324 mg via ORAL

## 2016-01-21 MED ORDER — NITROGLYCERIN 0.4 MG SL SUBL
0.4000 mg | SUBLINGUAL_TABLET | SUBLINGUAL | Status: DC | PRN
Start: 2016-01-21 — End: 2016-01-22

## 2016-01-21 MED ORDER — ONDANSETRON 4 MG PO TBDP
4.0000 mg | ORAL_TABLET | Freq: Four times a day (QID) | ORAL | Status: DC | PRN
Start: 2016-01-21 — End: 2016-01-22

## 2016-01-21 MED ORDER — ASPIRIN 81 MG PO CHEW
CHEWABLE_TABLET | ORAL | Status: AC
Start: 2016-01-21 — End: ?
  Filled 2016-01-21: qty 4

## 2016-01-21 MED ORDER — ACETAMINOPHEN 325 MG PO TABS
650.0000 mg | ORAL_TABLET | ORAL | Status: DC | PRN
Start: 2016-01-21 — End: 2016-01-22

## 2016-01-21 MED ORDER — OXYCODONE-ACETAMINOPHEN 10-325 MG PO TABS
1.0000 | ORAL_TABLET | Freq: Four times a day (QID) | ORAL | Status: DC | PRN
Start: 2016-01-21 — End: 2016-01-22
  Administered 2016-01-21 – 2016-01-22 (×2): 1 via ORAL
  Filled 2016-01-21 (×2): qty 1

## 2016-01-21 MED ORDER — ASPIRIN 81 MG PO CHEW
81.0000 mg | CHEWABLE_TABLET | Freq: Every day | ORAL | Status: DC
Start: 2016-01-22 — End: 2016-01-22
  Administered 2016-01-22: 81 mg via ORAL
  Filled 2016-01-21: qty 1

## 2016-01-21 MED ORDER — ACETAMINOPHEN 650 MG RE SUPP
650.0000 mg | RECTAL | Status: DC | PRN
Start: 2016-01-21 — End: 2016-01-22

## 2016-01-21 MED ORDER — ACETAMINOPHEN 160 MG/5ML PO SOLN
650.0000 mg | ORAL | Status: DC | PRN
Start: 2016-01-21 — End: 2016-01-22

## 2016-01-21 NOTE — ED Notes (Signed)
Bed: RAU-A  Expected date:   Expected time:   Means of arrival:   Comments:  17 after v

## 2016-01-21 NOTE — Plan of Care (Signed)
Problem: Chest Pain  Goal: Vital signs and cardiac rhythm stable  Outcome: Progressing  VS and HR stable.  Goal: Pain at adequate level as identified by patient  Outcome: Progressing  Med for pain as needed.

## 2016-01-21 NOTE — ED Provider Notes (Signed)
[facility]  EMERGENCY DEPARTMENT  History and Physical Exam       Patient Name: [name] [name],[name] [name]  Encounter Date:  01/21/2016  Physician Assistant: Boykin Peek, PA-C  Attending Physician: Joesphine Bare, MD  PCP: Esmond Harps, FNP  Patient DOB:  07/06/1961  MRN:  [id number]  Room:  2505/2505-A    History of Presenting Illness     Chief complaint: Chest Pain    HPI/ROS given by: Patient    [name] [name] is a 55 y.o. male who presents with intermittent sharp left-sided chest pain that comes and goes for the past 2 weeks. The pain lasts for about a minute or 2 at a time. It comes at rest and with exertion. There is no particular pattern. No association with eating. No cough. No associated shortness of breath, nausea/vomiting, diaphoresis. No associated leg swelling or pain. Also did a stress test 2-3 years ago which was reportedly normal. Denies any long car trips or plane rides or any recent injuries or surgeries or any hospitalizations in the past 3 months. He is pain-free here.      Review of Systems     Review of Systems   Constitutional: Negative for fever and chills.   Respiratory: Negative for shortness of breath.    Cardiovascular: Positive for chest pain. Negative for leg swelling.   Gastrointestinal: Negative for abdominal pain.   Neurological: Negative for speech difficulty.   Psychiatric/Behavioral: Negative for agitation.   All other systems reviewed and are negative.       Allergies & Medications     Pt is allergic to codeine; flexeril; naprosyn; toradol; and tramadol.    Current Discharge Medication List      CONTINUE these medications which have NOT CHANGED    Details   amitriptyline (ELAVIL) 50 MG tablet Take 50 mg by mouth nightly as needed for Sleep.         baclofen (LIORESAL) 10 MG tablet Take 10 mg by mouth 3 (three) times daily.      diflunisal (DOLOBID) 500 MG Tab tablet Take 500 mg by mouth 2 (two) times daily.         oxyCODONE-acetaminophen (PERCOCET) 10-325 MG per tablet Take 1  tablet by mouth every 6 (six) hours as needed for Pain.         pregabalin (LYRICA) 150 MG capsule Take 150 mg by mouth 2 (two) times daily.      vitamin B-12 (CYANOCOBALAMIN) 1000 MCG tablet Take 1,000 mcg by mouth daily.              Past Medical History     Pt has a past medical history of Arthritis; Muscle pain; Tobacco dependence; Chronic shoulder pain, right; Chronic back pain; Low back pain; Difficulty walking; and Cerebral palsy.     Past Surgical History     Pt  has past surgical history that includes TRACHEOSTOMY; Leg Surgery; Fracture surgery; Hernia repair; Fracture surgery; LAMINECTOMY, POSTERIOR LUMBAR, DECOMPRESSION, FUSION LEVEL 1 (N/A, 01/03/2015); and ARTHROSCOPY SHOULDER & BICEPS TENOTOMY (Right, 06/04/2015).     Family History     The family history includes Diabetes in his brother; Esophageal cancer in his father; No known problems in his mother.     Social History     Pt reports that he has been smoking Cigarettes.  He has been smoking about 0.50 packs per day. He has never used smokeless tobacco. He reports that he does not drink alcohol or use illicit drugs.  Physical Exam     Blood pressure 111/69, pulse 62, temperature 98.1 F (36.7 C), temperature source Oral, resp. rate 24, height 1.626 m, weight 50.8 kg, SpO2 100 %.    Constitutional: Well developed, well nourished, active, in no apparent distress.  HENT:   Head: Normocephalic, atraumatic  Ears: No external lesions.  Nose: No external lesions. No epistaxis or drainage.  Eyes: PERRL. No scleral icterus. No conjunctival injection. EOMI.  Neck: Trachea is midline. No JVD. Normal range of motion. No apparent masses.  Cardiovascular: Regular rhythm, S1 normal and S2 normal. No murmur heard.  Pulmonary/Chest: Effort normal. Lungs clear to auscultation bilaterally.  no chest wall tenderness.  Abdominal: Soft, non-tender, non-distended. No masses.   Genitourinay/Anorectal: Defferred  Musculoskeletal: Normal range of motion. No deformity or  apparent injury.  left lower extremity atrophy noted   Neurological: Pt is alert. Cranial nerves are grossly intact. Moving all extremities without apparent deficit.   Psychiatric: Affect is appropriate. There is no agitation.   Skin: Skin is warm, dry, well perfused. No rash noted. No cyanosis. No pallor.     Diagnostic Results     The results of the diagnostic studies below have been reviewed by myself:    Labs  Results     Procedure Component Value Units Date/Time    Lipid panel [[id number]] Collected:  01/21/16 1417    Specimen Information:  Plasma Updated:  01/21/16 1640     Cholesterol 174 mg/dL      Triglycerides 478 mg/dL      HDL 51 mg/dL      LDL Calculated 96 mg/dL      Coronary Heart Disease Risk 3.41      VLDL 27     D-dimer, quantitative [[id number]] Collected:  01/21/16 1417    Specimen Information:  Blood Updated:  01/21/16 1507     D-Dimer 0.41 mg/L FEU     i-Stat Troponin [[id number]] Collected:  01/21/16 1438    Specimen Information:  Blood Updated:  01/21/16 1452     Trop I, ISTAT <0.02 ng/mL     i-Stat Chem 8 CartrIDge [[id number]]  (Abnormal) Collected:  01/21/16 1441    Specimen Information:  Blood Updated:  01/21/16 1444     i-STAT Sodium 142 mMol/L      i-STAT Potassium 3.5 mMol/L      i-STAT Chloride 103 mMol/L      TCO2, ISTAT 25 mMol/L      Ionized Ca, ISTAT 4.60 mg/dL      i-STAT Glucose 96 mg/dL      i-STAT Creatinine 0.70 (L) mg/dL      i-STAT BUN 9 mg/dL      Anion Gap, ISTAT 40.1 (H)      EGFR 125 mL/min/1.89m2      i-STAT Hematocrit 41.0 %      i-STAT Hemoglobin 13.9 gm/dL     I-Stat Chem 8 [[id number]] Collected:  01/21/16 1417    Specimen Information:  ISTAT Updated:  01/21/16 1438     I-STAT Notification Istat Notification     I-Stat Troponin [[id number]] Collected:  01/21/16 1417    Specimen Information:  ISTAT Updated:  01/21/16 1438     I-STAT Notification Istat Notification     CBC [[id number]] Collected:  01/21/16 1417    Specimen Information:  Blood from Blood Updated:  01/21/16  1435     WBC 4.0 K/cmm      RBC 4.92 M/cmm      Hemoglobin 13.9 gm/dL  Hematocrit 41.2 %      MCV 84 fL      MCH 28 pg      MCHC 34 gm/dL      RDW 56.2 %      PLT CT 171 K/cmm      MPV 9.1 fL      NEUTROPHIL % 49.8 %      Lymphocytes 36.7 %      Monocytes 9.2 %      Eosinophils % 3.4 %      Basophils % 1.0 %      Neutrophils Absolute 2.0 K/cmm      Lymphocytes Absolute 1.5 K/cmm      Monocytes Absolute 0.4 K/cmm      Eosinophils Absolute 0.1 K/cmm      BASO Absolute 0.0 K/cmm           Radiologic Studies  Xr Chest 2 Views    01/21/2016  No focal parenchymal lung infiltrate, pleural effusion, or pneumothorax. Normal heart size. ReadingStation:[id number]      EKG: Sinus rhythm, J-point elevation in V2, similar to previous. No dynamic changes on repeat EKG. Reviewed by Dr. Raquel James.      ED Course and Medical Decision Making     ED Medication Orders     Start Ordered     Status Ordering Provider    01/21/16 1443 01/21/16 1442  aspirin chewable tablet 324 mg   Once in ED     Route: Oral  Ordered Dose: 324 mg     Last MAR action:  Given Analyah Mcconnon          The patient presents with chest pain of unclear etiology.  Initial cardiac markers and EKG are nondiagnostic. Based upon the presentation the patient appears to be at low risk for PE, aortic dissection, pneumothorax, pneumonia or other serious conditions. The plan is to admit this patient for cardiac evaluation, observation and further testing.  The admission plan was discussed with the patient and/or family and they will comply.  Results of lab/radiology/EKG tests were discussed with the patient and/or family. All questions were answered and concerns addressed and appropriate consultation was obtained.    I discussed this case with Dr. Raquel James in the emergency department who also directly examined the patient and agrees with the assessment and treatment plan.     In addition to the above history, please see nursing notes. Allergies, meds, past medical, family,  social hx, and the results of the diagnostic studies performed have been reviewed by myself.    This chart was generated by an EMR and may contain errors or omissions not intended by the user.     Procedures / Critical Care     None     Diagnosis / Disposition     Clinical Impression  1. Chest pain in adult        Disposition  ED Disposition     Observation Admitting Physician: Dedra Skeens [[id number]]  Diagnosis: Chest pain in adult [1308657]  Estimated Length of Stay: < 2 midnights  Tentative Discharge Plan?: Home or Self Care [1]  Patient Class: Observation [104]            Follow up for Discharged Patients  No follow-up provider specified.    Prescriptions for Discharged Patients  Current Discharge Medication List                   Boykin Peek, [place]  01/21/16 1717    Raquel James,  Zonia Kief, MD  01/26/16 [phone]

## 2016-01-21 NOTE — Plan of Care (Signed)
Problem: Pain  Goal: Patient's pain/discomfort is manageable  Outcome: Progressing

## 2016-01-21 NOTE — H&P (Signed)
[name]  OBSERVATION UNIT  HISTORY AND PHYSICAL       Patient: [name] [name]  Admission Date: 01/21/2016    DOB: Jun 09, 1961  Age: 55 y.o.    MRN: [id number]  Sex: male    PCP: Esmond Harps, FNP  Attending: Joesphine Bare, MD         HISTORY OF PRESENT ILLNESS     [name] [name] is a 55 y.o. male who presented with chest pain. Patient has had intermittent left-sided chest pain for 1 week. He describes it as a sharp pain lasting 5 minutes. There is no associated shortness of breath dizziness lightheadedness nausea or diaphoresis. He denies any recent nausea vomiting diarrhea or abdominal pain. His past medical history includes chronic back pain and ongoing tobacco use. His brother had a stent 1 month ago.    PAST MEDICAL HISTORY     Code Status: Prior    Past Medical History   Diagnosis Date   . Arthritis    . Muscle pain      torn bicep    . Tobacco dependence    . Chronic shoulder pain, right    . Chronic back pain    . Low back pain    . Difficulty walking    . Cerebral palsy        Past Surgical History   Procedure Laterality Date   . Tracheostomy     . Leg surgery       left leg surgery as child from CP   . Fracture surgery       hip surgery    . Hernia repair     . Fracture surgery     . Laminectomy, posterior lumbar, decompression, fusion level 1 N/A 01/03/2015     Procedure: LAMINECTOMY, POSTERIOR LUMBAR, DECOMPRESSION, FUSION LEVEL 1;  Surgeon: Haskel Khan, MD;  Location: [facility];  Service: Neurosurgery;  Laterality: N/A;  L5-S1 PLIF   . Arthroscopy shoulder & biceps tenotomy Right 06/04/2015     Procedure: ARTHROSCOPY SHOULDER & BICEPS TENOTOMY;  Surgeon: Hendricks Milo, MD;  Location: [facility];  Service: Orthopedics;  Laterality: Right;       Current/Home Medications    AMITRIPTYLINE (ELAVIL) 50 MG TABLET    Take 50 mg by mouth nightly as needed for Sleep.       BACLOFEN (LIORESAL) 10 MG TABLET    Take 10 mg by mouth 3 (three) times daily.    DIFLUNISAL  (DOLOBID) 500 MG TAB TABLET    Take 500 mg by mouth 2 (two) times daily.       OXYCODONE-ACETAMINOPHEN (PERCOCET) 10-325 MG PER TABLET    Take 1 tablet by mouth every 6 (six) hours as needed for Pain.       PREGABALIN (LYRICA) 150 MG CAPSULE    Take 150 mg by mouth 2 (two) times daily.    VITAMIN B-12 (CYANOCOBALAMIN) 1000 MCG TABLET    Take 1,000 mcg by mouth daily.       Allergies:   Allergies   Allergen Reactions   . Codeine Nausea And Vomiting   . Flexeril [Cyclobenzaprine] Other (See Comments)     Pt states this makes him "goofy"   . Naprosyn [Naproxen] Other (See Comments)     Causes stomach to hurt   . Toradol [Ketorolac Tromethamine] Nausea And Vomiting   . Tramadol Other (See Comments)     Causes him to hallucinate  Family History   Problem Relation Age of Onset   . Diabetes Brother    . No known problems Mother    . Esophageal cancer Father        SHx:  reports that he has been smoking Cigarettes.  He has been smoking about 0.50 packs per day. He has never used smokeless tobacco. He reports that he does not drink alcohol or use illicit drugs.    REVIEW OF SYSTEMS     Review of Systems   Constitutional: Negative for fever, chills and malaise/fatigue.   HENT: Negative for congestion and sore throat.    Eyes: Negative for blurred vision and double vision.   Respiratory: Negative for cough, sputum production, shortness of breath, wheezing and stridor.    Cardiovascular: Positive for chest pain. Negative for palpitations, orthopnea, leg swelling and PND.   Gastrointestinal: Negative for nausea, vomiting, abdominal pain and diarrhea.   Genitourinary: Negative for dysuria, urgency and frequency.   Musculoskeletal: Negative for myalgias and joint pain.   Skin: Negative.    Neurological: Negative for dizziness, tingling, focal weakness, loss of consciousness and headaches.   Endo/Heme/Allergies: Negative.    Psychiatric/Behavioral: Negative.        PHYSICAL EXAM     Vital Signs: Blood pressure 122/85, pulse  62, temperature 97.7 F (36.5 C), temperature source Oral, resp. rate 15, height 1.626 m (5\' 4" ), weight 50.8 kg (111 lb 15.9 oz), SpO2 100 %.    Physical Exam   Constitutional: He is oriented to person, place, and time. He appears well-developed and well-nourished.   HENT:   Head: Normocephalic and atraumatic.   Mouth/Throat: Oropharynx is clear and moist.   Eyes: Pupils are equal, round, and reactive to light.   Neck: Normal range of motion. Neck supple. No JVD present.   Cardiovascular: Normal rate, regular rhythm, normal heart sounds and intact distal pulses.  Exam reveals no gallop and no friction rub.    No murmur heard.  Pulmonary/Chest: Effort normal and breath sounds normal. No respiratory distress. He has no wheezes. He has no rales. He exhibits no tenderness.   Abdominal: Soft. Bowel sounds are normal. He exhibits no distension. There is no tenderness.   Musculoskeletal: Normal range of motion. He exhibits no edema or tenderness.   Neurological: He is alert and oriented to person, place, and time.   Skin: Skin is warm and dry. No rash noted. No erythema. No pallor.   Psychiatric: He has a normal mood and affect. His behavior is normal.   Vitals reviewed.      LABS & IMAGING     Labs:  Results for orders placed or performed during the [name] encounter of 01/21/16   CBC   Result Value Ref Range    WBC 4.0 4.0 - 11.0 K/cmm    RBC 4.92 4.00 - 5.70 M/cmm    Hemoglobin 13.9 13.0 - 17.5 gm/dL    Hematocrit 16.1 09.6 - 52.5 %    MCV 84 80 - 100 fL    MCH 28 28 - 35 pg    MCHC 34 32 - 36 gm/dL    RDW 04.5 40.9 - 81.1 %    PLT CT 171 130 - 440 K/cmm    MPV 9.1 6.0 - 10.0 fL    NEUTROPHIL % 49.8 42.0 - 78.0 %    Lymphocytes 36.7 15.0 - 46.0 %    Monocytes 9.2 3.0 - 15.0 %    Eosinophils % 3.4 0.0 - 7.0 %  Basophils % 1.0 0.0 - 3.0 %    Neutrophils Absolute 2.0 1.7 - 8.6 K/cmm    Lymphocytes Absolute 1.5 0.6 - 5.1 K/cmm    Monocytes Absolute 0.4 0.1 - 1.7 K/cmm    Eosinophils Absolute 0.1 0.0 - 0.8 K/cmm    BASO  Absolute 0.0 0.0 - 0.3 K/cmm   I-Stat Chem 8   Result Value Ref Range    I-STAT Notification Istat Notification    I-Stat Troponin   Result Value Ref Range    I-STAT Notification Istat Notification    D-dimer, quantitative   Result Value Ref Range    D-Dimer 0.41 0.19 - 0.52 mg/L FEU   i-Stat Chem 8 CartrIDge   Result Value Ref Range    i-STAT Sodium 142 135 - 145 mMol/L    i-STAT Potassium 3.5 3.5 - 5.3 mMol/L    i-STAT Chloride 103 98 - 112 mMol/L    TCO2, ISTAT 25 24 - 29 mMol/L    Ionized Ca, ISTAT 4.60 4.35 - 5.10 mg/dL    i-STAT Glucose 96 70 - 99 mg/dL    i-STAT Creatinine 2.35 (L) 0.90 - 1.30 mg/dL    i-STAT BUN 9 6 - 20 mg/dL    Anion Gap, ISTAT 57.3 (H) 7 - 16    EGFR 125 60 - 150 mL/min/1.61m2    i-STAT Hematocrit 41.0 39.0 - 52.5 %    i-STAT Hemoglobin 13.9 13.0 - 17.5 gm/dL   i-Stat Troponin   Result Value Ref Range    Trop I, ISTAT <0.02 0.00 - 0.02 ng/mL       Imaging:  XR Chest 2 Views   Final Result   No focal parenchymal lung infiltrate, pleural effusion, or pneumothorax. Normal heart size.      ReadingStation:[id number]          EKG: none Sinus rhythm; negative for ischemic changes.    EMERGENCY DEPARTMENT COURSE     ED Medication Orders     Start Ordered     Status Ordering Provider    01/21/16 1443 01/21/16 1442  aspirin chewable tablet 324 mg   Once in ED     Route: Oral  Ordered Dose: 324 mg     Last MAR action:  Given Boykin Peek          ED documentation including nurses notes were reviewed by myself.  The case was discussed with the ED Attending.    ASSESSMENT & PLAN     Stylianos Stradling is a 55 y.o. male admitted under OBSERVATION with   1. Chest pain  2. History chronic back pain  3. Ongoing tobacco use: Patient smokes 1 pack cigarettes daily  4. Family history CAD  5. Unknown lipid status    Assessment & Plan:  1. Transfer to observation unit  2. Serial cardiac enzymes  3. Telemetry monitoring  4. Nitrates PRN  5. Analgesics/Antiemetics PRN  6. Aspirin daily  7. Fasting lipids   8. Repeat  ECG PRN  9. Nuclear stress test in AM  10. Cardiology consultation PRN    I certify the need for admission to the Observation Unit based on the patient's history and the information above.    Rolin Barry, NP   01/21/2016 4:10 PM

## 2016-01-21 NOTE — ED Notes (Signed)
Pt c/o chest pain x 1 week. Pt states pain is in his left chest. Pt describes it as a "sharp pain". Pt also c/o left arm pain that started 2 days ago.

## 2016-01-22 ENCOUNTER — Observation Stay: Payer: Medicare Other

## 2016-01-22 MED ORDER — ASPIRIN 81 MG PO CHEW
81.0000 mg | CHEWABLE_TABLET | Freq: Every day | ORAL | Status: AC
Start: 2016-01-22 — End: ?

## 2016-01-22 MED ORDER — TECHNETIUM TC 99M SESTAMIBI - CARDIOLITE
10.9000 | Freq: Once | Status: AC | PRN
Start: 2016-01-22 — End: 2016-01-22
  Administered 2016-01-22: 10.9 via INTRAVENOUS

## 2016-01-22 MED ORDER — TECHNETIUM TC 99M SESTAMIBI - CARDIOLITE
32.4000 | Freq: Once | Status: AC | PRN
Start: 2016-01-22 — End: 2016-01-22
  Administered 2016-01-22: 32 via INTRAVENOUS

## 2016-01-22 MED ORDER — REGADENOSON 0.4 MG/5ML IV SOLN
0.4000 mg | Freq: Once | INTRAVENOUS | Status: AC
Start: 2016-01-22 — End: 2016-01-22
  Administered 2016-01-22: 0.4 mg via INTRAVENOUS
  Filled 2016-01-22: qty 5

## 2016-01-22 MED ORDER — REGADENOSON 0.4 MG/5ML IV SOLN
INTRAVENOUS | Status: AC
Start: 2016-01-22 — End: ?
  Filled 2016-01-22: qty 5

## 2016-01-22 NOTE — Discharge Summary (Signed)
[facility] OBSERVATION UNIT      Patient: [name] [name]  Admission Date: 01/21/2016   DOB: 1961-02-20  Discharge Date: 01/22/2016    MRN: [id number]  Discharge Attending: Dedra Skeens   Referring Physician: Esmond Harps, FNP  PCP: Esmond Harps, FNP       DISCHARGE SUMMARY     Discharge Information   Admission Diagnosis:    1. Chest pain: Lexiscan stress test 01/22/16 There is a small, mild, reversible defect involving the mid to apical anterior wall. There is an additional small, mild, reversible   defect involving the mid to basal inferoseptal wall. There is significant gut uptake which interferes with the study interpretation, but these may represent areas of mild ischemia.   Normal left ventricular systolic function. Abnormal but low risk study.   2. History chronic back pain  3. Ongoing tobacco use: Patient smokes 1 pack cigarettes daily  4. Family history CAD  5. Unknown lipid status    Discharge Diagnosis:   Patient Active Problem List    Diagnosis Date Noted   . Chest pain 01/21/2016   . Impingement syndrome of right shoulder 06/04/2015   . Acquired spondylolisthesis of lumbosacral region 01/03/2015   . 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 Tobacco dependence 02/22/2014        Discharge Medications:     Medication List      START taking these medications          aspirin 81 MG chewable tablet   Chew 1 tablet (81 mg total) by mouth daily.         CONTINUE taking these medications          amitriptyline 50 MG tablet   Commonly known as:  ELAVIL       baclofen 10 MG tablet   Commonly known as:  LIORESAL       diflunisal 500 MG Tabs tablet   Commonly known as:  DOLOBID       oxyCODONE-acetaminophen 10-325 MG per tablet   Commonly known as:  PERCOCET       pregabalin 150 MG capsule   Commonly known as:  LYRICA       vitamin B-12 1000 MCG tablet   Commonly known as:  CYANOCOBALAMIN            Where to Get Your Medications      Information about where to get these medications is not yet  available     ! Ask your nurse or doctor about these medications    - aspirin 81 MG chewable tablet              Hospital Course   Presentation History   [name] [name] is a 55 y.o. male who presented with chest pain. Patient has had intermittent left-sided chest pain for 1 week. He describes it as a sharp pain lasting 5 minutes. There is no associated shortness of breath dizziness lightheadedness nausea or diaphoresis. He denies any recent nausea vomiting diarrhea or abdominal pain. His past medical history includes chronic back pain and ongoing tobacco use. His brother had a stent 1 month ago.    Hospital Course ( Days)   Patient was transferred to the observation unit for cardiac rule out telemetry monitoring and stress testing. Enzymes remain negative. Lexiscan stress test was abnormal but low risk, please see full report as above. Patient's symptoms were overall atypical. I discussed this with cardiology, Dr. Orvis Brill. He reiterated that the stress test is low  risk however he should follow up in the office. He states that someone from his office will contact the patient for scheduling. He does recommend starting an aspirin daily as well as beta blocker. Gentle pressure runs on the low normal and as well as his heart rate. I'm going to defer starting beta blocker at this time. He should follow-up with his primary care physician in one to 2 weeks. I've given him the contact information for Dr. Orvis Brill so he can call the office in the event that someone does not contact him. Patient should return to the emergency department with any new or worsening symptoms.    Procedures/Imaging:   NM Myocardial Perfusion Spect (Stress And Rest)   Final Result      XR Chest 2 Views   Final Result   No focal parenchymal lung infiltrate, pleural effusion, or pneumothorax. Normal heart size.      ReadingStation:[id number]      CV Cardiac Stress Test Tracing Only    (Results Pending)       Treatment Team:   Attending Provider: Dedra Skeens, MD       Progress Note/Physical Exam at Discharge   Filed Vitals:    01/21/16 2229 01/22/16 0630 01/22/16 1112 01/22/16 1520   BP: 103/71 90/50 115/75 107/60   Pulse: 57 71 61 85   Temp: 97.8 F (36.6 C) 97.6 F (36.4 C) 97.4 F (36.3 C) 98.6 F (37 C)   TempSrc: Oral Oral Oral Oral   Resp: 16 16 18 18    Height:       Weight:       SpO2: 98% 100% 100% 100%        Diagnostics     Stress Test Results: See above    Labs/Studies Pending at Discharge: No    Last Labs     Recent Labs  Lab 01/21/16  1417   WBC 4.0   RBC 4.92   HEMOGLOBIN 13.9   HEMATOCRIT 41.2   MCV 84   PLT CT 171         Recent Labs  Lab 01/21/16  1441   I-STAT CREATININE 0.70*        Patient Instructions   Discharge Diet: regular diet  Discharge Activity:  activity as tolerated    Follow Up Appointment:        Follow-up Information     Follow up with Esmond Harps, FNP In 2 weeks.    Specialty:  Family Nurse Practitioner    Why:  Hospital follow-up chest pain    Contact information:    [address]  [place] [address]  [phone]          Follow up with Seward Speck, MD.    Specialty:  Interventional Cardiology    Why:  Someone should contact you to schedule a follow-up appointment    Contact information:    [address]  [place] [address]  [phone]             Time spent examining patient, discussing with patient/family regarding hospital course, chart review, reconciling medications and discharge planning: 30 minutes.      Rolin Barry, NP    3:50 PM 01/22/2016

## 2016-01-22 NOTE — Plan of Care (Signed)
Problem: Health Promotion  Goal: Vaccination Screening  All patients will be screened for current vaccination status on each admission.   Outcome: Completed Date Met:  01/22/16  Goal: Knowledge - disease process  Extent of understanding conveyed about a specific disease process.   Outcome: Completed Date Met:  01/22/16  Goal: Risk control - tobacco abuse  Actions to eliminate or reduce tobacco use.   Outcome: Completed Date Met:  01/22/16  Goal: Knowledge - health resources  Extent of understanding and conveyed about healthcare resources.   Outcome: Completed Date Met:  01/22/16    Problem: Safety  Goal: Patient will be free from injury during hospitalization  Outcome: Completed Date Met:  01/22/16    Problem: Pain  Goal: Patient's pain/discomfort is manageable  Outcome: Completed Date Met:  01/22/16    Problem: Psychosocial and Spiritual Needs  Goal: Demonstrates ability to cope with hospitalization/illness  Outcome: Completed Date Met:  01/22/16    Problem: Moderate/High Fall Risk Score >5  Goal: Patient will remain free of falls  Outcome: Completed Date Met:  01/22/16    Problem: Chest Pain  Goal: Vital signs and cardiac rhythm stable  Outcome: Completed Date Met:  01/22/16  Goal: Pain at adequate level as identified by patient  Outcome: Completed Date Met:  01/22/16

## 2016-01-22 NOTE — Discharge Instructions (Signed)
Noncardiac Chest Pain    Based on your visit today, the health care provider doesn't know what is causing your chest pain. In most cases, people who come to the emergency department with chest pain don't have a problem with their heart. Instead, the pain is caused by other conditions. These may be problems with the lungs, muscles, bones, digestive tract, nerves, or mental health.  Lung problems   Inflammation around the lungs (pleurisy)   Collapsed lung (pneumothorax)   Fluid around the lungs (pleural effusion)   Lung cancer. This is a rare cause of chest pain.  Muscle or bone problems   Inflamed cartilage between the ribs (pleurisy)   Fibromyalgia   Rheumatoid arthritis  Digestive system problems   Reflux   Stomach ulcer   Spasms of the esophagus   Gall stones   Gallbladder inflammation  Mental health conditions   Panic or anxiety attacks   Emotional distress  Your condition doesn't seem serious and your pain doesn't appear to be coming from your heart. But sometimes the signs of a serious problem take more time to appear. Watch for the warning signs listed below.  Home care  Follow these guidelines when caring for yourself at home:   Rest today and avoid strenuous activity.   Take any prescribed medicine as directed.  Follow-up care  Follow up with your health care provider, or as advised, if you don't start to feel better within 24 hours.  When to seek medical advice  Call your health care provider right away if any of these occur:   A change in the type of pain. Call if it feels different, becomes more serious, lasts longer, or begins to spread into your shoulder, arm, neck, jaw, or back.   Shortness of breath   You feel more pain when you breathe   Cough with dark-colored mucus or blood   Weakness, dizziness, or fainting   Fever of 100.4F (38C) or higher, or as directed by your health care provider   Swelling, pain, or redness in one leg  Date Last Reviewed: 08/29/2013   2000-2016  [employer] [address], [place] [address]. All rights reserved. This information is not intended as a substitute for professional medical care. Always follow your healthcare professional's instructions.          Uncertain Causes of Chest Pain    Chest pain can happen for a number of reasons. Sometimes the cause can't be determined. If yourcondition does not seem serious, and your pain does not appear to be coming from your heart, your healthcare provider may recommend watching it closely. Sometimes the signs of a serious problem take more time to appear. Many problems not related to your heart can cause chest pain.These include:   Musculoskeletal. Costochondritis, an inflammation of the tissues around the ribs that can occur from trauma or overuse injuries   Respiratory. Pneumonia, pneumothorax, or pneumonitis (inflammation of the lining of the chest and lungs)   Gastrointestinal. Esophageal reflux, heartburn, or gallbladder disease   Anxiety and panic disorders   Nerve compression and neuritis   Miscellaneous problems such as aortic aneurysm or pulmonary embolism (a blood clot in the lungs)  Home care  After your visit, follow these recommendations:   Rest today and avoid strenuous activity.   Take any prescribed medicine as directed.   Be aware of any recurrent chest pain and notice any changes  Follow-up care  Follow up with your healthcare provider if   you do not start to feel better within 24 hours, or as advised.  Call 911  Call 911 if any of these occur:   A change in the type of pain: if it feels different, becomes more severe, lasts longer, or begins to spread into your shoulder, arm, neck, jaw or back   Shortness of breath or increased pain with breathing   Weakness, dizziness, or fainting   Rapid heart beat   Crushing sensation in your chest  When to seek medical advice  Call your healthcare provider right away if any of the following occur:   Cough with dark  colored sputum (phlegm) or blood   Fever of 100.4F(38C) or higher, or as directed by your healthcare provider   Swelling, pain or redness in one leg   Shortness of breath  Date Last Reviewed: 10/04/2014   2000-2016 [employer] [address], [place] [address]. All rights reserved. This information is not intended as a substitute for professional medical care. Always follow your healthcare professional's instructions.

## 2016-01-22 NOTE — Progress Notes (Signed)
Nuclear stress test - 0.4 mg lexiscan injected over 20 seconds; no st changes or chest pain; sinus brady; rare pacs; sestamibi [place] [id number]

## 2016-01-22 NOTE — Progress Notes (Signed)
Pt unable to do treadmill. Lexiscan 0.4 mg IV administered per protocol. Pt tolerated it well.

## 2016-01-22 NOTE — UM Notes (Signed)
[facility] Utilization Management Review Sheet    NAME: [name] [name]  MR#: [id number]    CSN#: [id number]    ROOM: 2505/2505-A AGE: 55 y.o.    ADMIT DATE AND TIME: 01/21/2016  2:04 PM    PATIENT CLASS: OBSERVATION  01/21/16 @ 1518 ADMIT OBSERVATION-OBS UNIT    ATTENDING PHYSICIAN: Dedra Skeens  PAYOR:Payor: MEDICARE / Plan: MEDICARE PART A AND B / Product Type: *No Product type* /     AUTH #: NPR    DIAGNOSIS:     ICD-10-CM    1. Chest pain in adult R07.9      HISTORY:   Past Medical History   Diagnosis Date   . Arthritis    . Muscle pain      torn bicep    . Tobacco dependence    . Chronic shoulder pain, right    . Chronic back pain    . Low back pain    . Difficulty walking    . Cerebral palsy      DATE OF ED TREATMENT- 01/21/16  TREATMENT IN ED:  ASA     OBSERVATION REVIEW    HPI: Presents with 2 weeks of intermittent sharp left sided chest pain occuring at rest and with exertion.  + TOBACCO USE    ABNORMAL LABS: ANION GAP 19.0  EKG: Sinus rhythm; negative for ischemic changes    DIAGNOSTIC TESTING: XR CHEST: No focal parenchymal lung infiltrate, pleural effusion, or pneumothorax. Normal heart size.    VS: Blood pressure 111/69, pulse 62, temperature 98.1 F (36.7 C), temperature source Oral, resp. rate 24, height 1.626 m, weight 50.8 kg, SpO2 100 %.    ASSESSMENT AND PLAN:  CHEST PAIN  PLAN:  TELEMETRY, SERIAL CE'S, ASA QD, NST IN AM    OBSERVATION ORDERS   VS Q 4 HRS, TELEMETRY, SERIAL CE'S, ASA QD, NST IN AM, CONTINUE HOME MEDS    DAY 2 OBSERVATION FOLLOW-UP  01/22/16  LABS: TROP 0.01  VS: BP 90/50 mmHg  Pulse 71  Temp(Src) 97.6 F (36.4 C) (Oral)  Resp 16  Ht 1.626 m (5\' 4" )  Wt 50.8 kg (111 lb 15.9 oz)  BMI 19.21 kg/m2  SpO2 100%  CURRENT ORDERS AS ABOVE  NST PENDING    DATE OF REVIEW: 01/22/2016    Wende Mott, RN  Utilization Management  Case Management Department    [facility]  [address]  [place], [place] [address]  T [phone]    F [phone]    [email or web address]

## 2016-04-03 ENCOUNTER — Emergency Department
Admission: EM | Admit: 2016-04-03 | Discharge: 2016-04-03 | Disposition: A | Discharge status: Home / Self Care | Payer: Medicare Other | Attending: Emergency Medicine | Admitting: Emergency Medicine

## 2016-04-03 ENCOUNTER — Emergency Department: Payer: Medicare Other

## 2016-04-03 DIAGNOSIS — R519 Headache, unspecified: Secondary | ICD-10-CM

## 2016-04-03 DIAGNOSIS — M5412 Radiculopathy, cervical region: Secondary | ICD-10-CM | POA: Insufficient documentation

## 2016-04-03 DIAGNOSIS — R51 Headache: Secondary | ICD-10-CM | POA: Insufficient documentation

## 2016-04-03 DIAGNOSIS — M5090 Cervical disc disorder, unspecified, unspecified cervical region: Secondary | ICD-10-CM | POA: Insufficient documentation

## 2016-04-03 DIAGNOSIS — M542 Cervicalgia: Secondary | ICD-10-CM | POA: Insufficient documentation

## 2016-04-03 MED ORDER — METHYLPREDNISOLONE 4 MG PO TBPK
ORAL_TABLET | ORAL | Status: DC
Start: 2016-04-03 — End: 2016-06-18

## 2016-04-03 MED ORDER — OXYCODONE-ACETAMINOPHEN 5-325 MG PO TABS
1.0000 | ORAL_TABLET | Freq: Three times a day (TID) | ORAL | Status: DC | PRN
Start: 2016-04-03 — End: 2016-06-18

## 2016-04-03 NOTE — Discharge Instructions (Signed)
Understanding Cervical Radiculopathy    Cervical radiculopathy is irritation or inflammation of a nerve root in the neck. It causes neck pain and other symptoms that may spread into the chest or down the arm. To understand this condition, it helps to understand the parts of the spine:   Vertebrae. These are bones that stack to form the spine. The cervical spine contains the 7 vertebrae in the neck.   Disks. These are soft pads of tissue between the vertebrae. They act as shock absorbers for the spine.   The spinal canal. This is a tunnel formed within the stacked vertebrae. The spinal cord runs through this canal.   Nerves. These branch off the spinal cord. As they leave the spinal canal, nerves pass through openings between the vertebrae. The nerve root is the part of the nerve that is closest to the spinal cord.  With cervical radiculopathy, nerve roots in the neck become irritated. This leads to pain and symptoms that can travel to the nerves that go from the spinal cord down the arms and into the torso.  What causes cervical radiculopathy?  Aging, injury, poor posture, and other issues can lead to problems in the neck. These problems may then irritate nerve roots. These include:   Damage to a disk in the cervical spine. The damaged disk may then press on nearby nerve roots.   Degeneration from wear and tear, and aging. This can lead to narrowing (stenosis) of the openings between the vertebrae. The narrowed openings press on nerve roots as they leave the spinal canal.   An unstable spine. This is when a vertebra slips forward. It can then press on a nerve root.  There are other, less common causes of pressure on nerves in the neck. These include infection, cysts, and tumors.  Symptoms of cervical radiculopathy  These include:   Neck pain   Pain, numbness, tingling, or weakness that travels down the arm   Loss of neck movement   Muscle spasms  Treatment for cervical radiculopathy  In most cases,  your healthcare provider will first try treatments that help relieve symptoms. These may include:   Prescription or over-the-counter pain medicines. These help relieve pain and swelling.   Cold packs. These help reduce pain.   Resting. This involves avoiding positions and activities that increase pain.   Neck brace (cervical collar). This can help relieve inflammation and pain.   Physical therapy, including exercises and stretches. This can help decrease pain and increase movement and function.   Shots of medicinesaround the nerve roots. This is done to help relieve symptoms for a time.  In some cases, your healthcare provider may advise surgery to fix the underlying problem. This depends on the cause, the symptoms, and how long the pain has lasted.  Possible complications  Over time, an irritated and inflamed nerve may become damaged. This may lead to long-lasting (permanent) numbness or weakness. If symptoms change suddenly or get worse, be sure to let your healthcare provider know.    When to call your healthcare provider  Call your healthcare provider right away if you have any of these:   New pain or pain that gets worse   New or increasing weakness, numbness, or tingling in your arm or hand   Bowel or bladder changes   Date Last Reviewed: 12/14/2014   2000-2016 [employer] [address], [place] [address]. All rights reserved. This information is not intended as a substitute for professional   medical care. Always follow your healthcare professional's instructions.          Headache, Unspecified    A number of things can cause headaches. The cause of your headache isn't clear. But it doesn't seem to be a sign of any serious illness.  You could have a tension headache or a migraine headache.  Stress can cause a tension headache. This can happen if you tense the muscles of your shoulders, neck, and scalp without knowing it. If this stress lasts long enough, you may develop a  tension headache.  It is not clear why migraines occur, but certain things called" triggers" can raise the risk of having a migraine attack. Migraine triggers may include emotional stress or depression, or by hormone changes during the menstrual cycle. Other triggers include birth control pills and other medicines, alcohol or caffeine, foods with tyramine (such as aged cheese, wine), eyestrain, weather changes, missed meals, and lack of sleep or oversleeping.  Other causes of headache include:   Viral illness with high fever   Head injury with concussion   Sinus, ear, or throat infection   Dental pain and jaw joint (TMJ) pain  More serious but less common causes of headache include stroke, brain hemorrhage, brain tumor, meningitis, and encephalitis.  Home care  Follow these tips when taking care of yourself at home:   Don't drive yourself home if you were given pain medicine for your headache. Instead, have someone else drive you home. Try to sleep when you get home. You should feel much better when you wake up.   Apply heat to the back of your neck to ease a neck muscle spasm. Take care of a migraine headache by putting an ice pack on your forehead or at the base of your skull.   If you have nausea or vomiting, eat a light diet until your headache eases.   If you have a migraine headache, use sunglasses when in the daylight or around bright indoor lighting until your symptoms get better. Bright glaring light can make this type of headache worse.  Follow-up care  Follow up with your healthcare provider, or as advised. Talk with your provider if you have frequent headaches. He or she can help figure out a treatment plan. By knowing the earliest signs of headache, and starting treatment right away, you may be able to stop the pain yourself.  When to seek medical advice  Call your healthcare provider right awayif any of these occur:   Your head pain suddenly gets worse after sexual intercourse or strenuous  activity   Your head pain doesn't get better within 24 hours   You aren't able to keep liquids down (repeated vomiting)   Fever of 100.51F (38C) or higher, or as directed by your healthcare provider   Stiff neck   Extreme drowsiness, confusion, or fainting   Dizziness or dizziness with spinning sensation (vertigo)   Weakness in an arm or leg or one side of your face   You have trouble talking or seeing  Date Last Reviewed: 05/07/2015   2000-2016 [employer] [address], [place], [place] [address]. All rights reserved. This information is not intended as a substitute for professional medical care. Always follow your healthcare professional's instructions.

## 2016-04-03 NOTE — ED Notes (Signed)
Pt states he has been having back pain for 3 day. IN addition he is also having headaches and neck pain. Denies n/v/d. He states he is having numbness and tingling on right side. He denies having any trouble walking.

## 2016-04-03 NOTE — ED Provider Notes (Signed)
[facility]  EMERGENCY DEPARTMENT  History and Physical Exam       Patient Name: [name] [name],[name] [name]  Encounter Date:  04/03/2016  Physician Assistant: Gweneth Dimitri, PA-C  Attending Physician: Myna Hidalgo, MD  PCP: Esmond Harps, FNP  Patient DOB:  August 19, 1961  MRN:  [id number]  Room:  E59/E59-A      History of Presenting Illness     Chief complaint: neck pain  and headache    HPI/ROS given by: Patient    [name] [name] is a 55 y.o. male who presents with chronic neck pain with radiating right arm pain and radiating right sided head pain that flared up a few weeks ago. He saw his pain specialist, Dr. Danne Baxter, last week and he was scheduled for a epidural steroid injection on 04/10/2016. He saw his PCP today and he is currently on gabapentin, Tylenol, and Motrin with minimal relief. He is out of his Percocet. He is not on Suboxone or methadone.     MRI of the cervical spine on 07/12/2015 shows:    1. Multilevel moderate spondylosis. Severe degenerative disc changes C3-4 and 45. Moderate degenerative disc changes  C5-6 and C6-7.  2. There is severe right and moderately severe left foraminal narrowing at C5-6 secondary to posterior spondylotic disc  bulge and facet hypertrophy.  3. C7-T1 has severe bilateral foraminal narrowing secondary to posterior spondylotic disc bulge and facet changes.  4. There is moderately severe bilateral foraminal narrowing at C4-5 secondary to posterior spondylotic disc bulge and  facet hypertrophy.  5. Moderate bilateral foraminal narrowing is present at C3-4 secondary to posterior spondylotic disc bulge and facet  hypertrophy.  6. At C6-7 there is mild left foraminal narrowing secondary to posterior spondylotic disc bulge.      Review of Systems     Review of Systems   Constitutional: Negative for fever and chills.   HENT: Negative for dental problem, ear discharge, ear pain, facial swelling and sinus pressure.    Eyes: Negative for photophobia, pain and visual disturbance.    Respiratory: Negative.    Cardiovascular: Negative.    Gastrointestinal: Negative for nausea, vomiting and abdominal pain.   Genitourinary: Negative.    Musculoskeletal: Positive for neck pain. Negative for back pain and arthralgias.   Skin: Negative for rash.   Neurological: Positive for numbness and headaches. Negative for facial asymmetry and light-headedness.   Psychiatric/Behavioral: Negative for confusion, sleep disturbance and decreased concentration.      Rest of review of systems is negative.  Allergies & Medications     Pt is allergic to codeine; flexeril; naprosyn; toradol; and tramadol.    Discharge Medication List as of 04/03/2016  4:12 PM      CONTINUE these medications which have NOT CHANGED    Details   gabapentin (NEURONTIN) 300 MG capsule , Starting 04/02/2016, Until Discontinued, Historical Med      amitriptyline (ELAVIL) 50 MG tablet Take 50 mg by mouth nightly as needed for Sleep.   , Starting 01/01/2016, Until Discontinued, Historical Med      aspirin 81 MG chewable tablet Chew 1 tablet (81 mg total) by mouth daily., Starting 01/22/2016, Until Discontinued, No Print      baclofen (LIORESAL) 10 MG tablet Take 10 mg by mouth 3 (three) times daily., Until Discontinued, Historical Med      diflunisal (DOLOBID) 500 MG Tab tablet Take 500 mg by mouth 2 (two) times daily.   , Starting 12/28/2015, Until Discontinued, Historical Med  pregabalin (LYRICA) 150 MG capsule Take 150 mg by mouth 2 (two) times daily., Until Discontinued, Historical Med      vitamin B-12 (CYANOCOBALAMIN) 1000 MCG tablet Take 1,000 mcg by mouth daily., Until Discontinued, Historical Med              Past Medical History     Pt has a past medical history of Arthritis; Muscle pain; Tobacco dependence; Chronic shoulder pain, right; Chronic back pain; Low back pain; Difficulty walking; and Cerebral palsy.     Past Surgical History     Pt  has past surgical history that includes TRACHEOSTOMY; Leg Surgery; Fracture surgery; Hernia  repair; Fracture surgery; LAMINECTOMY, POSTERIOR LUMBAR, DECOMPRESSION, FUSION LEVEL 1 (N/A, 01/03/2015); and ARTHROSCOPY SHOULDER & BICEPS TENOTOMY (Right, 06/04/2015).     Family History     The family history includes Diabetes in his brother; Esophageal cancer in his father; No known problems in his mother.     Social History     Pt reports that he has been smoking Cigarettes.  He has been smoking about 0.50 packs per day. He has never used smokeless tobacco. He reports that he does not drink alcohol or use illicit drugs.     Physical Exam     Blood pressure 130/85, pulse 86, temperature 97.9 F (36.6 C), temperature source Oral, resp. rate 22, height 1.626 m, weight 52.5 kg, SpO2 97 %.      Constitutional: Not in distress. Well-hydrated.  HEENT: Eyes:PERRL, no scleral icterus or conjunctival pallor. Nose: Normal appearance.  Neck: Trachea midline, supple, no thyroidmegaly or masses.  Cardiovascular: RRR, normal S1 S2, no murmurs, gallops, or rubs.  Respiratory: Normal rate. Clear to auscultation bilaterally.   Gastrointestinal: +BS, non-distended, soft, non-tender, no rebound or guarding, no hepatosplenomegaly.  Musculoskeletal: Neck pain with decreased range of motion due to pain. Hand grip is normal. No arm weakness. Radial pulses 2+ and symmetric. Good capillary refill.  Extremities: No edema or calf tenderness.  Skin: No rashes, lesions, or jaundice.  Neurologic: CN 2-12 grossly intact. No gross motor or sensory deficits. Good sensations in left arm/hand/fingers. Upper extremity reflexes are normal. Able to move all fingers. Neurovascular intact.  Psychiatric: AAOx3, affect and mood appropriate. Alert, interactive, and appropriate.         Diagnostic Results     The results of the diagnostic studies below have been reviewed by myself:    Labs  Results     ** No results found for the last 24 hours. **          Radiologic Studies  No results found.         ED Course and Medical Decision Making     ED Medication  Orders     None        Patient was seen for neck pain with radiating left arm pain and radiating pain to the left side of his head. He has known bulging disks in his C-spine. He is scheduled to have an epidural steroid injection next week.    He was started on Medrol Dosepak as directed and given Norco to take as needed for pain.    He was advised to return to the emergency department for worsening or new symptoms. Otherwise, he should keep his scheduled appointment with pain clinic to get a epidural steroid injection next week.    In addition to the above history, please see nursing notes. Allergies, meds, past medical, family, social hx, and the results of  the diagnostic studies performed have been reviewed by myself.    I discussed this case with the attending physician in the emergency department and they agree with the assessment and treatment plan.     This chart was generated by an EMR and may contain errors or omissions not intended by the user.     Procedures / Critical Care     None     Diagnosis / Disposition     Clinical Impression  1. Cervical radiculopathy    2. Neck pain    3. Headache, unspecified headache type    4. Bulging discs    Disposition  ED Disposition     Discharge [name] discharge to home/self care.    Condition at disposition: Stable              Follow up for Discharged Patients  Esmond Harps, FNP  [address]  [place] [address]  [phone]    In 1 week        Prescriptions for Discharged Patients  Discharge Medication List as of 04/03/2016  4:12 PM      START taking these medications    Details   methylPREDNISolone (MEDROL DOSPACK) 4 MG tablet follow package directions, Print      oxyCODONE-acetaminophen (PERCOCET) 5-325 MG per tablet Take 1 tablet by mouth every 8 (eight) hours as needed for Pain., Starting 04/03/2016, Until Discontinued, Print                      Gweneth Dimitri [place], [place]  04/03/16 1831    Myna Hidalgo, MD  04/04/16 1114

## 2016-04-15 ENCOUNTER — Emergency Department (HOSPITAL_BASED_OUTPATIENT_CLINIC_OR_DEPARTMENT_OTHER)
Admission: EM | Admit: 2016-04-15 | Discharge: 2016-04-15 | Disposition: A | Discharge status: Home / Self Care | Payer: Medicare Other | Attending: Emergency Medicine | Admitting: Emergency Medicine

## 2016-04-15 ENCOUNTER — Encounter (HOSPITAL_BASED_OUTPATIENT_CLINIC_OR_DEPARTMENT_OTHER): Payer: Self-pay

## 2016-04-15 DIAGNOSIS — G809 Cerebral palsy, unspecified: Secondary | ICD-10-CM | POA: Insufficient documentation

## 2016-04-15 DIAGNOSIS — G8929 Other chronic pain: Secondary | ICD-10-CM | POA: Insufficient documentation

## 2016-04-15 DIAGNOSIS — F17210 Nicotine dependence, cigarettes, uncomplicated: Secondary | ICD-10-CM | POA: Insufficient documentation

## 2016-04-15 DIAGNOSIS — Z716 Tobacco abuse counseling: Secondary | ICD-10-CM | POA: Insufficient documentation

## 2016-04-15 HISTORY — DX: Scoliosis, unspecified: M41.9

## 2016-04-15 MED ORDER — MELOXICAM 15 MG TABLET
15.0000 mg | ORAL_TABLET | Freq: Every day | ORAL | 0 refills | Status: DC
Start: 2016-04-15 — End: 2016-04-16

## 2016-04-15 MED ORDER — LIDOCAINE 5 % TOPICAL PATCH
1.0000 | MEDICATED_PATCH | Freq: Every day | CUTANEOUS | 0 refills | Status: DC
Start: 2016-04-15 — End: 2016-04-16

## 2016-04-15 MED ORDER — MELOXICAM 7.5 MG TABLET
7.5000 mg | ORAL_TABLET | Freq: Every day | ORAL | Status: DC
Start: 2016-04-15 — End: 2016-04-15
  Filled 2016-04-15: qty 1

## 2016-04-15 MED ORDER — MELOXICAM 7.5 MG TABLET
7.50 mg | ORAL_TABLET | ORAL | Status: DC
Start: 2016-04-15 — End: 2016-04-15
  Administered 2016-04-15: 0 mg via ORAL
  Filled 2016-04-15: qty 1

## 2016-04-15 NOTE — ED Provider Notes (Signed)
Bertha Stakes, MD  [facility]  Emergency Department Visit Note    Date:  04/15/2016  Primary care provider:  No Established Pcp  Means of arrival:  private car  History obtained from: patient  History limited by: none    Chief Complaint:  Shoulder Pain     HISTORY OF PRESENT ILLNESS     Curtis Martin, date of birth March 10, 1961, is a 55 y.o. male who presents to the Emergency Department complaining of chronic right shoulder pain. He admits he was seen here in this ED and given medications but has run out. Patient reports he is waiting for pain management on August 14th, but in the meantime needs something to control his pain. Patient rates their constant pain a 7/10.     REVIEW OF SYSTEMS     The pertinent positive and negative symptoms are as per HPI. All other systems reviewed and are negative.     PATIENT HISTORY     Past Medical History:  Past Medical History:   Diagnosis Date    CP (cerebral palsy) (HCC)     Scoliosis        Past Surgical History:  Past Surgical History:   Procedure Laterality Date    Hx back surgery      Hx knee surgery         Family History:  No history of acute family illness given at this time.       Social History:  Social History   Substance Use Topics    Smoking status: Current Every Day Smoker     Packs/day: 1.00    Smokeless tobacco: None    Alcohol use No      Comment: hasn't drank for three years.     History   Drug Use No       Medications:  Outpatient Prescriptions Marked as Taking for the 04/15/16 encounter [facility] Encounter)   Medication Sig    pregabalin (LYRICA) 75 mg Oral Capsule Take 75 mg by mouth Once a day       Allergies:  Allergies   Allergen Reactions    Flexeril [Cyclobenzaprine]  Other Adverse Reaction (Add comment)     "I don't like the way it makes me feel"    Gabapentin  Other Adverse Reaction (Add comment)     palpitations    Naproxen Sodium Nausea/ Vomiting       PHYSICAL EXAM     Vitals:  Filed Vitals:    04/15/16 1327   BP: 125/83   Pulse:  75   Resp: 16   Temp: 36.9 C (98.5 F)   SpO2: 99%     Pulse ox  99% on None (Room Air) interpreted by me as: Normal    Constitutional: NAD.    Head: Normocephalic and atraumatic.   ENT: Moist mucous membranes. No erythema or exudates in the oropharynx. Normal voice.  Eyes: EOM are normal. Pupils are equal, round, and reactive to light. No scleral icterus.   Neck: Neck supple. FROM neck.  Cardiovascular: Normal rate and regular rhythm. No murmur heard. 2+ distal pulses all 4 extremities.  Pulmonary/Chest: Effort normal and breath sounds normal.   Abdominal: Soft with no distension. No abdominal tenderness  Back: There is no CVA tenderness.   Musculoskeletal:  No clubbing or cyanosis. 2+ pulses to bilateral upper extremities.   Neurological: Patient is alert and awake. Able to ambulate well in room. Symmetric hand grip . Full range of motion of  arms. Strength and sensation normal in all extremities. Normal facial symmetry and speech.   Skin: Skin is warm and dry.     ED PROGRESS NOTE / MEDICAL DECISION MAKING     Old records reviewed by me:  I have reviewed the nurse's notes. I have reviewed the patient's problem list.      Orders Placed This Encounter    meloxicam (MOBIC) tablet     1455: Initial evaluation is complete at this time. I have screened the patient for tobacco use and the patient is a tobacco user.  I have counseled the patient for less than 3 minutes to quit using tobacco products due to their multiple adverse health effects. Pre-hypertension/Hypertension: The patient has been informed that they may have pre-hypertension or hypertension based on an elevated blood pressure reading in the ED.  I recommend that the patient call the provider listed on their discharge instructions or a physician of their choice to arrange follow up for further evaluation of possible pre-hypertension or hypertension. Patient will be discharged with Lidoderm and Mobic. Will give a dose of Mobic PO prior to evaluation.  Patient is to follow up with pain management on August 14. I discussed the diagnosis, disposition, and follow-up plan. The patient understood and is in accordance with the treatment plan at this time. Patient is to return here to the Emergency Department if new or worsening symptoms appear. All of their questions have been answered to their satisfaction. The patient is in stable condition at the time of discharge.     Pre-Disposition Vitals:  Filed Vitals:    04/15/16 1327   BP: 125/83   Pulse: 75   Resp: 16   Temp: 36.9 C (98.5 F)   SpO2: 99%     CLINICAL IMPRESSION     Chronic pain    DISPOSITION/PLAN     Discharged        Prescriptions:     New Prescriptions    LIDOCAINE (LIDODERM) 5 % ADHESIVE PATCH, MEDICATED    1 Patch (700 mg total) by Transdermal route Once a day    MELOXICAM (MOBIC) 15 MG ORAL TABLET    Take 1 Tab (15 mg total) by mouth Once a day     Follow-Up:     Follow up with pain management on Aug 14th    Condition at Disposition: Stable      SCRIBE ATTESTATION STATEMENT  I Corey HaroldEliel Vega, SCRIBE scribed for Bertha StakesLee, Tiyanna Larcom Mingyi, MD on 04/15/2016 at 2:50 PM.     Documentation assistance provided for Bertha StakesLee, Weslie Rasmus Mingyi, MD  by Corey HaroldEliel Vega, SCRIBE. Information recorded by the scribe was done at my direction and has been reviewed and validated by me Bertha StakesLee, Alexys Lobello Mingyi, MD.

## 2016-04-15 NOTE — Discharge Instructions (Signed)
You will need to follow up with the pain management center on your scheduled appointment on Aug 14th.

## 2016-04-15 NOTE — ED Triage Notes (Signed)
C/o right neck, shoulder and arm pain. Patient reports this is a chronic problem and is waiting for pain management in August.

## 2016-04-15 NOTE — ED Nurses Note (Signed)
Discharge instructions reviewed with patient, no questions asked by patient. Patient states understanding of instructions.      Current Discharge Medication List      START taking these medications.       Details    lidocaine 5 % Adhesive Patch, Medicated   Commonly known as:  LIDODERM    1 Patch, Transdermal, Daily   Qty:  14 Patch   Refills:  0       meloxicam 15 mg Tablet   Commonly known as:  MOBIC    15 mg, Oral, Daily   Qty:  21 Tab   Refills:  0         CONTINUE these medications - NO CHANGES were made during your visit.       Details    pregabalin 75 mg Capsule   Commonly known as:  LYRICA    75 mg, Oral, Daily   Refills:  0

## 2016-04-15 NOTE — ED Nurses Note (Signed)
Patient is refusing mobic pain medication. Dr. Nedra HaiLee made aware. RX for mobic is destroyed and patient is instructed to use the lidocaine patches onlty.

## 2016-04-16 ENCOUNTER — Encounter (INDEPENDENT_AMBULATORY_CARE_PROVIDER_SITE_OTHER): Payer: Self-pay | Admitting: Family Medicine

## 2016-04-16 ENCOUNTER — Ambulatory Visit (INDEPENDENT_AMBULATORY_CARE_PROVIDER_SITE_OTHER): Payer: Medicare Other | Admitting: Family Medicine

## 2016-04-16 VITALS — BP 135/84 | HR 75 | Temp 98.4°F | Wt 114.0 lb

## 2016-04-16 DIAGNOSIS — M25511 Pain in right shoulder: Secondary | ICD-10-CM

## 2016-04-16 MED ORDER — KETOROLAC 15 MG/ML INJECTION SOLUTION
15.0000 mg | Freq: Once | INTRAMUSCULAR | 0 refills | Status: AC
Start: 2016-04-16 — End: 2016-04-16

## 2016-04-16 MED ORDER — METHYLPREDNISOLONE 4 MG TABLETS IN A DOSE PACK
ORAL_TABLET | ORAL | 0 refills | Status: AC
Start: 2016-04-16 — End: ?

## 2016-04-16 NOTE — Progress Notes (Signed)
BP 135/84   Pulse 75   Temp 36.9 C (98.4 F) (Oral)    Wt 51.7 kg (114 lb)   SpO2 100%   BMI 19.57 kg/m2  Sharee PimpleJoyce Jaquarius Seder, RN  04/16/2016, 13:01

## 2016-04-16 NOTE — Patient Instructions (Signed)
[facility]  [address]  [place], [place] [address]  Phone: [phone]  Fax: [phone]               Open Mon - Sat 8:00am - 8:00pm Lahoma CrockerSun Noon- 8:00pm                                                              ~ Closed Thanksgiving and Christmas Day     Attending Caregiver: Cristie HemPriyankar Taunja Brickner, MD      Today's orders:   Orders Placed This Encounter    Methylprednisolone (MEDROL DOSEPACK) 4 mg Oral Tablets, Dose Pack    ketorolac (TORADOL) 15 mg/mL Injection Solution        Prescription(s) E-Rx to:  [id number] [place], [place] - [address]    ________________________________________________________________________  Short Term Disability and Family Medical Leave Act  [facility] does NOT provide assistance with any disability applications.  If you feel your medical condition requires you to be on disability, you will need to follow up with  Your primary care physician or a specialist.  We apologize for any inconvenience.    For Medication Prescribed by [facility]:  As an Urgent Care facility, our clinic does NOT offer prescription refills over the telephone.    If you need more of the medication one of our medical providers prescribed, you will  Either need to be re-evaluated by us or see your primary care physician.    ________________________________________________________________________      It is very important that we have a phone number that is the single best way to contact you in the event that we become aware of important clinical information or concerns after your discharge.  If the phone number you provided at registration is NOT this number you should inform staff and registration prior to leaving.      Your treatment and evaluation today was focused on identifying and treating potentially emergent conditions based on your presenting signs, symptoms, and history.  The resulting initial clinical impression and treatment plan is not intended to be definitive or a  substitute for a full physical examination and evaluation by your primary care provider.  If your symptoms persist, worsen, or you develop any new or concerning symptoms, you need to be evaluated.      If you received x-rays during your visit, be aware that the final and formal interpretation of those films by a radiologist may occur after your discharge.  If there is a significant discrepancy identified after your discharge, we will contact you at the telephone number provided at registration.      If you received a pelvic exam, you may have cultures pending for sexually transmitted diseases.  Positive cultures are reported to the [facility] Department of Health as required by state law.  You should be contacted if you cultures are positive.  We will not contact you if they are negative.  You did NOT receive a PAP smear (the screening test for cervical).  This specific test for women is best performed by your gynecologist or primary care provider when indicated.      If you are over 53 year old, we cannot discuss your personal health information with a parent, spouse, family member, or  anyone else without your express consent.  This does not include those who have legitimate access to your records and information to assist in your care under the provisions of HIPAA [facility] Portability and Accountability Act) law, or those to whom you have previously given express written consent to do so, such a legal guardian or Power of [place].      You may have received medication that may cause you to feel drowsy and/or light headed for several hours.  You may even experience some amnesia of your stay.  You should avoid operating a motor vehicle or performing any activity requiring complete alertness or coordination until you feel fully awake (approximately 24-48 hours).  Avoid alcoholic beverages.  You may also have a dry mouth for several hours.  This is a normal side effect and will disappear as the effects of the  medication wear off.      Instructions discussed with patient upon discharge by clinical staff with all questions answered.  Please call [facility] [phone]) if any further questions.  Go immediately to the emergency department if any concern or worsening symptoms.      Cristie Hem, MD 04/16/2016, 13:13        Shoulder Pain with Uncertain Cause  Shoulder pain can have many causes. Pain often comes from the structures that surround the shoulder joint. These are the joint capsule, ligaments, tendons, muscles, and bursa. Pain can also come from cartilage in the joint. Cartilage can become worn out or injured. Its important to know whats causing your pain so the healthcare provider can use the correct treatment. But sometimes its difficult to find the exact cause of shoulder pain. You may need to see a specialist (orthopedist). You may also need special tests such as a CT scan or MRI. The provider may need to use special tools to look inside the joint (arthroscopy).  Shoulder pain can be treated with a sling or a device that keeps your shoulder from moving. You can take an anti-inflammatory medicine such as ibuprofen to ease pain. You may need to do special shoulder exercises. Follow up with a specialist if the pain is severe or doesnt go away after a few weeks.  Home care  Follow these tips when caring for yourself at home:   If a sling was given to you, leave it in place for the time advised by your healthcare provider. If you arent sure how long to wear it, ask for advice. If the sling becomes loose, adjust it so that your forearm is level with the ground. Your shoulder should feel well supported.   Put an ice pack on the injured area for 20 minutes every 1 to 2 hours the first day. You can make your own ice pack by putting ice cubes in a plastic bag. Wrap the bag in a thin towel. Continue with ice packs 3 to 4 times a day for the next 2 days. Then use the pack as needed to ease pain and  swelling.   You may use acetaminophen or ibuprofen to control pain, unless another pain medicine was prescribed.If you have chronic liver or kidney disease, talk with your healthcare provider before using these medicines. Also talk with your provider if Myrtis Ser ever had a stomach ulcer or GI bleeding.   Shoulder pain may seem worse at night, when there is less to distract you from the pain. If you sleep on your side, try to keep weight off your painful shoulder. Propping  pillows behind you may stop you from rolling over onto that shoulder during sleep.   Shoulder and elbow joints can become stiff if left in a sling for too long. You should start range of motion exercises about 7 to 10 days after the injury. Talk with your provider to find out what type of exercises to do and how soon to start.   You can take the sling off to shower or bathe.  Follow-up care  Follow up with your healthcare provider if you dont start to get better in the next 5 days.  When to seek medical advice  Call your healthcare provider right awayif any of these occur:   Pain or swelling gets worseor continues for more than a few days   Your hand or fingers become cold, blue, numb, or tingly   Large amount of bruising on your shoulder or upper arm   Difficulty moving your hand or fingers   Weakness in your hand or fingers   Your shoulder becomes stiff   It feels like your shoulder is popping out   You are less able to do your daily activities  Date Last Reviewed: 07/07/2015   2000-2017 [employer] [address] [place], [place] [address]. All rights reserved. This information is not intended as a substitute for professional medical care. Always follow your healthcare professional's instructions.

## 2016-04-16 NOTE — Nursing Note (Signed)
04/16/16 1300   Nursing Procedures / Med Admin   Medications E-M Ketorolac tromethamine (Toradol)   Medication Dose 15 ml   Route of Administration IM   Time of Administration 1320   Site Left Gluteus   [facility] # [id number]   LOT # [id number]   Expiration date 09/05/16   Manufacturer [employer]   Clinic Supplied Yes   Initials JS

## 2016-04-16 NOTE — Progress Notes (Signed)
[facility]  [facility]  [address], [address]  [place] [id number]  Dept: [phone]  Dept Fax: [phone]  Loc: [phone]  Loc Fax: [phone]     Patient Name: Curtis FothergillJoseph Martin  Date of Service: 04/16/16  Date of Birth: 09/04/1961    Chief Complaint: Headache; Neck Pain; and Shoulder Pain (right)      HPI: Curtis Martin is a 55 y.o. male who presents today with R shoulder pain that's started 10 days back- 6/10, worse with movement. Rest makes I better. He is using arm sling.  He was in the emergency room yesterday where he was being prescribed Mobic which he refused to take.  He was advised to use Lidoderm patch and Mobic.  He says that he did not pick up the Mobic yet.  Denies any trauma.  He reports that he [place] in [place] in the next month and also with pain management in the middle of next month.  He was asking if I could give him Percocet/hydrocodone.  He says that in the past he has had surgery for a ruptured biceps tendon, and since then he has been having pain within his right shoulder which recently flared up about 10 days ago.  History:  Vital signs and history as obtained by clinical staff.  Past Medical History:   Diagnosis Date    CP (cerebral palsy) (HCC)     Scoliosis          Past Surgical History:   Procedure Laterality Date    HX BACK SURGERY      HX KNEE SURGERY           Family History   Problem Relation Age of Onset    Cancer Father      esophagus         Social History     Social History    Marital status: Single     Spouse name: N/A    Number of children: N/A    Years of education: N/A     Social History Main Topics    Smoking status: Current Every Day Smoker     Packs/day: 1.00    Smokeless tobacco: Not on file    Alcohol use No      Comment: hasn't drank for three years.    Drug use: No    Sexual activity: Not on file     Other Topics Concern    Not on file     Social History Narrative     Expanded  Substance History     Additional history       Allergies:  Allergies   Allergen Reactions    Flexeril [Cyclobenzaprine]  Other Adverse Reaction (Add comment)     "I don't like the way it makes me feel"    Gabapentin  Other Adverse Reaction (Add comment)     palpitations    Mobic [Meloxicam] Nausea/ Vomiting    Naproxen Sodium Nausea/ Vomiting     Problem List:  There are no active problems to display for this patient.    Medication:  Outpatient Encounter Prescriptions as of 04/16/2016   Medication Sig Dispense Refill    ketorolac (TORADOL) 15 mg/mL Injection Solution 1 mL (15 mg total) by Intramuscular route One time for 1 dose 1 mL 0    Methylprednisolone (MEDROL DOSEPACK) 4 mg Oral Tablets, Dose Pack Take as instructed. 21 Tab 0    [DISCONTINUED] lidocaine (  LIDODERM) 5 % Adhesive Patch, Medicated 1 Patch (700 mg total) by Transdermal route Once a day 14 Patch 0    [DISCONTINUED] meloxicam (MOBIC) 15 mg Oral Tablet Take 1 Tab (15 mg total) by mouth Once a day 21 Tab 0    [DISCONTINUED] pregabalin (LYRICA) 75 mg Oral Capsule Take 75 mg by mouth Once a day       Facility-Administered Encounter Medications as of 04/16/2016   Medication Dose Route Frequency Provider Last Rate Last Dose    [DISCONTINUED] meloxicam (MOBIC) tablet  7.5 mg Oral Daily Bertha Stakes, MD        [DISCONTINUED] meloxicam [facility]) tablet  7.5 mg Oral Now Bertha Stakes, MD   Stopped at 04/15/16 1600       Review of Systems:  Pertinent items are noted in HPI. All pertinent positives and negatives noted in the HPI  Constitutional: No recent illness, no fever, no chills, no night sweats, no weight loss, no loss of appetite.  Neuro: No dizziness, No lightheadedness, No syncope, no hx of seizures  Eyes: No blurring of vision or diplopia.  ENT: No sore throat. No dysphagia, No odynophagia, No hearing loss, No tinnitus, No rhinorrhea, No sinus pains, No nasal congestion.  Respiratory: No cough, No shortness of breath, No wheezing, No  hemoptysis.  Cardiovascular: no chest pain  No palpitation, No exertional dyspnea  Muskuloskeletal:  + right shoulder pain.Marland Kitchen  GI: no abdominal pain,nausea,vomiting,diarrhea.  GU: no dysuria,frequency of urination.  Skin: no rashes.    Exam:  General Vitals: BP 135/84   Pulse 75   Temp 36.9 C (98.4 F) (Oral)    Wt 51.7 kg (114 lb)   SpO2 100%   BMI 19.57 kg/m2  General: acutely ill and mild distress  Eyes: Conjunctivae/corneas clear, PERRLA, EOM's intact.   Head - normocephalic, atraumatic.  Neck- supple  HENT:Left TM and canal normal. , Right TM and canal normal. , Nose without erythema. , Mouth mucous membranes moist , Pharynx without injection or exudate. , No oral lesions.   Lungs: clear to auscultation bilaterally. No rales no rhonchi no wheezing  Cardiovascular: Heart regular rate and rhythm, S1, S2 normal, no murmur, click, rub or gallop  Abdomen: soft, non-tender, non-distended, no hepatosplenomegaly, no masses and no hernias  Skin: Skin color, texture, turgor normal.   Neurologic: alert and oriented x3, CN 2-12 grossly intact, B/L upper and lower extremity muscle strength-5/5   Musculoskeletal-there is an obvious deformity at right shoulder joint.  There is tenderness at the site of insertion of the biceps tendon.  There is limited range of motion at the shoulder joint.  He reports that he has pain with abduction and extension.    Assessment and Plan:    ICD-10-CM    1. Acute pain of right shoulder M25.511      Medication Orders   Medications    Methylprednisolone (MEDROL DOSEPACK) 4 mg Oral Tablets, Dose Pack     Sig: Take as instructed.     Dispense:  21 Tab     Refill:  0    ketorolac (TORADOL) 15 mg/mL Injection Solution     Sig: 1 mL (15 mg total) by Intramuscular route One time for 1 dose     Dispense:  1 mL     Refill:  0    Right shoulder pain-patient was seen in the ER yesterday and he left AMA.  This was reported by the patient himself.  I will administer Toradol shot  and prescribed him Medrol  Dosepak.  Advised him to follow up with his primary care physician/pain management/orthopedic.  He is agreeable with the plan. Advised the patient to use arm sling for arm support and take some rest.    Plan was discussed and patient/parent verbalized understanding.  If symptoms are not improving within the next couple of days,  advised patient/parent  to followup with primary care or return to the Urgent Care for further evaluation.  Go to Emergency Department immediately for further work up if worsening symptoms or other medical concerns.      Cristie Hem, MD  04/16/2016, 13:04

## 2016-04-17 ENCOUNTER — Encounter (INDEPENDENT_AMBULATORY_CARE_PROVIDER_SITE_OTHER): Payer: Self-pay | Admitting: Family Medicine

## 2016-04-17 NOTE — Nurses Notes (Signed)
Pt seen in urgent care yesterday. Attempted to call for follow up, no answer at this time. Left VM to call back with any questions or concerns.

## 2016-05-08 ENCOUNTER — Emergency Department (EMERGENCY_DEPARTMENT_HOSPITAL): Payer: Medicare Other

## 2016-05-08 ENCOUNTER — Emergency Department
Admission: EM | Admit: 2016-05-08 | Discharge: 2016-05-08 | Disposition: A | Discharge status: Home / Self Care | Payer: Medicare Other | Attending: Emergency Medicine | Admitting: Emergency Medicine

## 2016-05-08 ENCOUNTER — Emergency Department: Payer: Medicare Other

## 2016-05-08 DIAGNOSIS — G8929 Other chronic pain: Secondary | ICD-10-CM | POA: Insufficient documentation

## 2016-05-08 DIAGNOSIS — M549 Dorsalgia, unspecified: Secondary | ICD-10-CM | POA: Insufficient documentation

## 2016-05-08 DIAGNOSIS — M419 Scoliosis, unspecified: Secondary | ICD-10-CM | POA: Insufficient documentation

## 2016-05-08 DIAGNOSIS — S20229A Contusion of unspecified back wall of thorax, initial encounter: Secondary | ICD-10-CM

## 2016-05-08 DIAGNOSIS — M47812 Spondylosis without myelopathy or radiculopathy, cervical region: Secondary | ICD-10-CM

## 2016-05-08 DIAGNOSIS — M5137 Other intervertebral disc degeneration, lumbosacral region: Secondary | ICD-10-CM

## 2016-05-08 DIAGNOSIS — W19XXXA Unspecified fall, initial encounter: Secondary | ICD-10-CM

## 2016-05-08 DIAGNOSIS — G809 Cerebral palsy, unspecified: Secondary | ICD-10-CM | POA: Insufficient documentation

## 2016-05-08 DIAGNOSIS — W1789XA Other fall from one level to another, initial encounter: Secondary | ICD-10-CM | POA: Insufficient documentation

## 2016-05-08 DIAGNOSIS — Z79899 Other long term (current) drug therapy: Secondary | ICD-10-CM | POA: Insufficient documentation

## 2016-05-08 DIAGNOSIS — S161XXA Strain of muscle, fascia and tendon at neck level, initial encounter: Secondary | ICD-10-CM | POA: Insufficient documentation

## 2016-05-08 DIAGNOSIS — F17200 Nicotine dependence, unspecified, uncomplicated: Secondary | ICD-10-CM | POA: Insufficient documentation

## 2016-05-08 DIAGNOSIS — Z888 Allergy status to other drugs, medicaments and biological substances status: Secondary | ICD-10-CM | POA: Insufficient documentation

## 2016-05-08 DIAGNOSIS — M542 Cervicalgia: Secondary | ICD-10-CM | POA: Insufficient documentation

## 2016-05-08 DIAGNOSIS — Z981 Arthrodesis status: Secondary | ICD-10-CM

## 2016-05-08 DIAGNOSIS — S300XXA Contusion of lower back and pelvis, initial encounter: Secondary | ICD-10-CM | POA: Insufficient documentation

## 2016-05-08 MED ORDER — HYDROCODONE 5 MG-ACETAMINOPHEN 325 MG TABLET
1.00 | ORAL_TABLET | Freq: Four times a day (QID) | ORAL | 0 refills | Status: AC | PRN
Start: 2016-05-08 — End: ?

## 2016-05-08 MED ORDER — OXYCODONE-ACETAMINOPHEN 5 MG-325 MG TABLET
1.00 | ORAL_TABLET | ORAL | Status: AC
Start: 2016-05-08 — End: 2016-05-08
  Administered 2016-05-08: 1 via ORAL
  Filled 2016-05-08: qty 1

## 2016-05-08 NOTE — Discharge Instructions (Signed)
Chronic Pain  Painserves an important role. It lets you know something is wrong that needs your attention. When the body heals, pain normally goes away.  When pain lasts longer than six months, it is called "chronic" pain. This is pain that is present even after the body has healed.Chronic pain can cause mood problems and get in the way of your relationships and your daily life.  A number of conditions can cause chronic pain. Some of the more common include:   Previous surgery   An old injury   Infection   Diseases such as diabetes   Nerve damage   Back injury   Arthritis   Migraine or other headaches   Fibromyalgia   Cancer  Depression and stress can make chronic pain symptoms worse.In some cases, a cause for the pain cannot be found.  Treatment  Treatmentcangreatly reducepain.In many cases,pain can become less severe, occur less often, and interfere less with your daily life.Chronic pain is often treated with a combination of medicines,therapies, and lifestyle changes. You will work closely with your healthcare provider to find a treatment plan that works best for you.   Ask your healthcare provider for a referral to a pain management specialty center. These can provide the most recent and proven pain management strategies, along with emotional support and comprehensive services.   Several different types of medicines may be prescribed for chronic pain. Work with your healthcare provider to develop a medicine plan that helps manage your pain.   Physical therapy can be very effective in helping reduce certain types of chronic pain.   Occupational therapy teaches you how to do routine tasks of daily living in ways that lessen your discomfort.   Psychological therapy can help youcope better with stress and pain.   Other therapies such as meditation, yoga, biofeedback, massage, and acupuncture can also help manage chronic pain.   Changing certain habits can help reduce chronic pain. They  include:   Eating healthy   Developing an exercise routine   Getting enough sleep at night   Stopping smoking and limiting alcohol use   Losing excess weight  Follow-up care  Follow up with yourhealthcare provideras advised. Let yourhealthcare providerknow if your current treatment plan is working or if changes are needed.  Resources  For more information, contact:   American Council for Headache Society www.achenet.org   American Chronic Pain Association www.theacpa.org 800-533-3231  Date Last Reviewed: 04/30/2014   2000-2016 [employer]. [address], [place] [address]. All rights reserved. This information is not intended as a substitute for professional medical care. Always follow your healthcare professional's instructions.

## 2016-05-08 NOTE — ED Notes (Addendum)
Pt reports he is having neck and lower back pain. He was seen last week by his pain management physician and received an injection last week. He only takes lyrica and medicates with tylenol for pain but reports it has become worse. Has a hx of scoliosis and neck surgery. He reports some slight numbness and tingling in his right foot for several days. He states "My pain management physician is on vacation and they told me to come here if i couldn't wait".

## 2016-05-08 NOTE — ED Provider Notes (Signed)
[facility]  EMERGENCY DEPARTMENT  History and Physical Exam       Patient Name: [name],[name]  Encounter Date:  05/08/2016  Physician Assistant: Judson Roch, PA-C  Attending Physician: Clovis Riley, MD  PCP: Esmond Harps, FNP  Patient DOB:  May 06, 1961  MRN:  [id number]  Room:  E52/E52-A      History of Presenting Illness     Chief complaint: Pain    HPI/ROS given by: Patient    [name] [name] is a 55 y.o. male who presents with chronic back, neck, leg pain that he rates as 7/10, constant, worse with movement, traveling throughout his body, no known alleviating factors. Denies any injuries. States he has had this pain for a number of years. States his pain management doctor is trying to get his pain controlled with Lyrica but this is not helping so he needs something stronger for the pain.       Review of Systems     Review of Systems   Constitutional: Negative for fever and chills.   Musculoskeletal: Positive for myalgias, back pain and arthralgias. Negative for joint swelling.   Skin: Negative for rash.   Hematological: Does not bruise/bleed easily.        Allergies & Medications     Pt is allergic to codeine; flexeril; naprosyn; toradol; and tramadol.    Current/Home Medications    ASPIRIN 81 MG CHEWABLE TABLET    Chew 1 tablet (81 mg total) by mouth daily.    METHYLPREDNISOLONE (MEDROL DOSPACK) 4 MG TABLET    follow package directions    OXYCODONE-ACETAMINOPHEN (PERCOCET) 5-325 MG PER TABLET    Take 1 tablet by mouth every 8 (eight) hours as needed for Pain.    PREGABALIN (LYRICA) 150 MG CAPSULE    Take 150 mg by mouth 2 (two) times daily.        Past Medical History     Pt has a past medical history of Arthritis; Muscle pain; Tobacco dependence; Chronic shoulder pain, right; Chronic back pain; Low back pain; Difficulty walking; and Cerebral palsy.     Past Surgical History     Pt  has past surgical history that includes TRACHEOSTOMY; Leg Surgery; Fracture surgery; Hernia repair; Fracture surgery;  LAMINECTOMY, POSTERIOR LUMBAR, DECOMPRESSION, FUSION LEVEL 1 (N/A, 01/03/2015); and ARTHROSCOPY SHOULDER & BICEPS TENOTOMY (Right, 06/04/2015).     Family History     The family history includes Diabetes in his brother; Esophageal cancer in his father; No known problems in his mother.     Social History     Pt reports that he has been smoking Cigarettes.  He has been smoking about 0.50 packs per day. He has never used smokeless tobacco. He reports that he does not drink alcohol or use illicit drugs.     Physical Exam     Filed Vitals:    05/08/16 1602   BP: 116/90   Pulse: 86   Temp: 98.1 F (36.7 C)   TempSrc: Oral   Resp: 20   Height: 1.626 m   Weight: 50.6 kg   SpO2: 96%         Physical Exam   Constitutional: He is oriented to person, place, and time. He appears well-developed and well-nourished.   HENT:   Head: Normocephalic and atraumatic.   Cardiovascular: Normal rate, regular rhythm, normal heart sounds and intact distal pulses.  Exam reveals no gallop and no friction rub.    No murmur heard.  Pulmonary/Chest: Effort normal. No  respiratory distress.   Neurological: He is alert and oriented to person, place, and time.   Skin: Skin is warm and dry.          Diagnostic Results     The results of the diagnostic studies below have been reviewed by myself:    Labs  Results     ** No results found for the last 24 hours. **          Radiologic Studies  No results found.    EKG: none       ED Course and Medical Decision Making     ED Medication Orders     None          PMP of patient suspicious for Pharmacy/provider shopping. Multiple Oxycodone prescriptions from different providers, including [place], [place]. Patient is under the care of pain management specialist. Encouraged him to follow up with pain management for best control of chronic pain. Discussed reasons to return to the ED. Patient verbalized understanding.  All questions answered to patient's satisfaction.  Patient in agreement with this plan.     In  addition to the above history, please see nursing notes. Allergies, meds, past medical, family, social hx, and the results of the diagnostic studies performed have been reviewed by myself.    I discussed this case with the attending physician in the emergency department and they agree with the assessment and treatment plan.     This chart was generated by an EMR and may contain errors or omissions not intended by the user.     Procedures / Critical Care     None     Diagnosis / Disposition     Clinical Impression  1. Other chronic pain        Disposition  ED Disposition     Discharge [name] discharge to home/self care.    Condition at disposition: Stable              Follow up for Discharged Patients  Ash[place], Tilman Neat, DO  [address]  [place] [address]  [phone]    Call      [facility] Emergency Department  [address]  [place] [address]  [phone]    As needed, If symptoms worsen      Prescriptions for Discharged Patients  New Prescriptions    No medications on file                Eino Farber, [place]  05/08/16 1746    Clovis Riley, MD  05/08/16 [phone]

## 2016-05-08 NOTE — ED Provider Notes (Signed)
Patient was triaged and had a brief initial evaluation in [facility] for HA, R shoulder and neck pain, lumbar pain. Hx of scoliosis. Hx of surgery from Dr. Azzie Almas. Pt sees Dr. Danne Baxter for his neck pain, had MRI of C spine in 07/2015.  Care turned over to [facility] team.      Clovis Riley, MD  05/27/16 [phone]

## 2016-05-08 NOTE — Consults (Signed)
Patient did not meet criteria for SBIRT consult during today's visit.

## 2016-05-08 NOTE — Discharge Instructions (Signed)
REST   RETURN HERE IF WORSENS   FOLLOW UP WITH YOUR PHYSICIAN   NO TYLENOL ALCOHOL OR DRIVING WITH IN 6 HOURS OF MEDICATIONS

## 2016-05-08 NOTE — ED Provider Notes (Signed)
[facility]  Emergency Department     HISTORY OF PRESENT ILLNESS     Date:  05/08/2016  Patient's Name:  Curtis Martin  Date of Birth:  09-02-1961    HPI PT HERE WITH FALL. PT FELL 3 FEET OUT OF TRUCK LANDED ON BACK. PT DID NOT HIT HEAD . NO LOC. COMPLAINING NECK AND BACK PAIN. PT HX OF CHRONIC BACK PAIN. NO CHEST PAIN. NO SHORTNESS OR BREATH. NO N/T. PT HAS BEEN AMBULATORY. NO LOSS OF BOWEL OR BLADDER CONTROL.     Review of Systems     Review of Systems   Constitutional: Negative.    HENT: Negative.    Respiratory: Negative.    Cardiovascular: Negative.    Gastrointestinal: Negative.    Genitourinary: Negative.    Musculoskeletal: Positive for back pain and neck pain. Negative for arthralgias, gait problem, joint swelling, myalgias and neck stiffness.   Skin: Negative.    Neurological: Negative.    Hematological: Negative.    Psychiatric/Behavioral: Negative.        Previous History     Past Medical History:  Past Medical History:   Diagnosis Date    CP (cerebral palsy) (HCC)     Scoliosis        Past Surgical History:  Past Surgical History:   Procedure Laterality Date    Hx back surgery      Hx knee surgery         Social History:  Social History   Substance Use Topics    Smoking status: Current Every Day Smoker     Packs/day: 1.00    Smokeless tobacco: None    Alcohol use No      Comment: hasn't drank for three years.     History   Drug Use No       Family History:  Family History   Problem Relation Age of Onset    Cancer Father      esophagus       Medication History:  Current Outpatient Prescriptions   Medication Sig    HYDROcodone-acetaminophen (NORCO) 5-325 mg Oral Tablet Take 1 Tab by mouth Every 6 hours as needed for Pain    Methylprednisolone (MEDROL DOSEPACK) 4 mg Oral Tablets, Dose Pack Take as instructed.       Allergies:  Allergies   Allergen Reactions    Flexeril [Cyclobenzaprine]  Other Adverse Reaction (Add comment)     "I don't like the way it makes me  feel"    Gabapentin  Other Adverse Reaction (Add comment)     palpitations    Mobic [Meloxicam] Nausea/ Vomiting    Naproxen Sodium Nausea/ Vomiting       Physical Exam     Vitals:    BP 118/79   Pulse 66   Temp 36.8 C (98.3 F)   Ht 1.626 m (5\' 4" )   Wt 50.3 kg (111 lb)   SpO2 99%   BMI 19.05 kg/m2    Physical Exam   Constitutional: He is oriented to person, place, and time. He appears well-developed and well-nourished.   HENT:   Head: Normocephalic and atraumatic.   Mouth/Throat: Oropharynx is clear and moist.   Eyes: Conjunctivae and EOM are normal. Pupils are equal, round, and reactive to light.   Neck: Normal range of motion. Neck supple.   Cardiovascular: Normal rate and regular rhythm.    Pulmonary/Chest: Effort normal and breath sounds normal.   Abdominal: Soft. Bowel sounds are  normal.   Musculoskeletal: Normal range of motion. He exhibits tenderness. He exhibits no edema or deformity.   GENERALIZED TTP OF THE C SPINE, AND LUMBAR SPINE. NO DEFORMITY.    Neurological: He is alert and oriented to person, place, and time.   Skin: Skin is warm.   Psychiatric: He has a normal mood and affect. His behavior is normal.   Nursing note and vitals reviewed.      Diagnostic Studies/Treatment     Medications:  Medications   oxyCODONE-acetaminophen (PERCOCET) 5-325mg  per tablet (1 Tab Oral Given 05/08/16 1934)       New Prescriptions    HYDROCODONE-ACETAMINOPHEN (NORCO) 5-325 MG ORAL TABLET    Take 1 Tab by mouth Every 6 hours as needed for Pain       Labs:    No results found for any visits on 05/08/16.    Radiology:  XR CERVICAL SPINE SERIES (4 OR MORE VIEWS)  XR LUMBAR SPINE SERIES    XR CERVICAL SPINE SERIES (4 OR MORE VIEWS)    (Results Pending)   XR LUMBAR SPINE SERIES    (Results Pending)     NAP   ECG:  NONE      Procedure     Procedures    Course/Disposition/Plan     Course:    Disposition:    Discharged    Condition at Disposition:   Stable     Follow up:   Pcp, No Established    Schedule an appointment as  soon as possible for a visit in 1 day      [facility]  [address]  [place] [address]  [phone]  Schedule an appointment as soon as possible for a visit in 1 day        Clinical Impression:     Encounter Diagnoses   Name Primary?    Fall, initial encounter Yes    Neck strain, initial encounter     Back contusion, unspecified laterality, initial encounter        Future Appointments Scheduled in Epic:  No future appointments.

## 2016-05-08 NOTE — ED Nurses Note (Signed)
Patient discharged home with family.  AVS reviewed with patient/care giver.  A written copy of the AVS and discharge instructions was given to the patient/care giver.  Questions sufficiently answered as needed.  Patient/care giver encouraged to follow up with PCP as indicated.  In the event of an emergency, patient/care giver instructed to call 911 or go to the nearest emergency room.     Script x1 given.  Pt verbalized understanding of instructions. Ambulatory at time of discharge

## 2016-05-08 NOTE — ED Triage Notes (Signed)
Larey Seat of fof a truck approximately 3 feet and landed on back. Denies LOC. C/o pain back of head, neck and back. Ambulatory to ED.

## 2016-06-18 ENCOUNTER — Emergency Department: Payer: Medicare Other

## 2016-06-18 ENCOUNTER — Emergency Department
Admission: EM | Admit: 2016-06-18 | Discharge: 2016-06-18 | Disposition: A | Discharge status: Home / Self Care | Payer: Medicare Other | Attending: Emergency Medicine | Admitting: Emergency Medicine

## 2016-06-18 DIAGNOSIS — M5441 Lumbago with sciatica, right side: Secondary | ICD-10-CM | POA: Insufficient documentation

## 2016-06-18 DIAGNOSIS — M5440 Lumbago with sciatica, unspecified side: Secondary | ICD-10-CM

## 2016-06-18 MED ORDER — METHYLPREDNISOLONE 4 MG PO TBPK
ORAL_TABLET | ORAL | 0 refills | Status: DC
Start: 2016-06-18 — End: 2016-08-12

## 2016-06-18 MED ORDER — TRAMADOL HCL 50 MG PO TABS
50.0000 mg | ORAL_TABLET | Freq: Four times a day (QID) | ORAL | 0 refills | Status: DC | PRN
Start: 2016-06-18 — End: 2016-10-10

## 2016-06-18 NOTE — ED Provider Notes (Signed)
[facility]  EMERGENCY DEPARTMENT  History and Physical Exam       Patient Name: [name] [name],[name] [name]  Encounter Date:  06/18/2016  Treating Provider: Arlice Colt, PA-C  Supervising Physician: Myna Bright, MD  PCP: Esmond Harps, FNP  Patient DOB:  11-Jan-1961  MRN:  [id number]  Room:  E55/E55-A      History of Presenting Illness     Chief complaint: Back Pain    HPI/ROS is limited by: none  HPI/ROS given by: patient    Location: low back  Duration:  3 days  Severity: moderate    [name] [name] is a 55 y.o. male who presents with Acute exacerbation of chronic low back pain. He states he doesn't recall a specific injury but he has a history of back surgery last year. He has pain in his lower back which radiates into the right foot. States he's had this type of radicular pain before. He denies any numbness, weakness, IVDA fever or chills or known history of malignancy, no saddle anesthesia.       Review of Systems       Review of Systems   Constitutional: Negative.  Negative for fever.   Eyes: Negative.    Respiratory: Negative for shortness of breath.    Cardiovascular: Negative for chest pain.   Gastrointestinal: Negative for abdominal pain.   Musculoskeletal: Positive for back pain. Negative for neck pain.   Neurological: Negative for focal weakness and headaches.       Review of systems is otherwise negative except as noted above.     Allergies & Medications     Pt is allergic to codeine; flexeril [cyclobenzaprine]; naprosyn [naproxen]; and toradol [ketorolac tromethamine].    Current/Home Medications    ASPIRIN 81 MG CHEWABLE TABLET    Chew 1 tablet (81 mg total) by mouth daily.    PREGABALIN (LYRICA) 150 MG CAPSULE    Take 150 mg by mouth 2 (two) times daily.        Past Medical History     Past Medical History:   Diagnosis Date   . Arthritis    . Cerebral palsy    . Chronic back pain    . Chronic shoulder pain, right    . Difficulty walking    . Low back pain    . Muscle pain     torn bicep    . Tobacco  dependence         Past Surgical History     Past Surgical History:   Procedure Laterality Date   . ARTHROSCOPY SHOULDER & BICEPS TENOTOMY Right 06/04/2015    Procedure: ARTHROSCOPY SHOULDER & BICEPS TENOTOMY;  Surgeon: Hendricks Milo, MD;  Location: [facility];  Service: Orthopedics;  Laterality: Right;   . FRACTURE SURGERY      hip surgery    . FRACTURE SURGERY     . HERNIA REPAIR     . LAMINECTOMY, POSTERIOR LUMBAR, DECOMPRESSION, FUSION LEVEL 1 N/A 01/03/2015    Procedure: LAMINECTOMY, POSTERIOR LUMBAR, DECOMPRESSION, FUSION LEVEL 1;  Surgeon: Haskel Khan, MD;  Location: [facility];  Service: Neurosurgery;  Laterality: N/A;  L5-S1 PLIF   . LEG SURGERY      left leg surgery as child from CP   . TRACHEOSTOMY          Family History     Family History   Problem Relation Age of Onset   . No known problems Mother    . Esophageal  cancer Father    . Diabetes Brother           Social History     Social History     Social History   . Marital status: Married     Spouse name: N/A   . Number of children: N/A   . Years of education: N/A     Social History Main Topics   . Smoking status: Current Every Day Smoker     Packs/day: 0.50     Types: Cigarettes   . Smokeless tobacco: Never Used   . Alcohol use No   . Drug use: No   . Sexual activity: Not Asked     Other Topics Concern   . None     Social History Narrative    ** Merged History Encounter **         ** Merged History Encounter **               Physical Exam     Blood pressure 117/75, pulse 87, temperature 98.2 F (36.8 C), resp. rate 16, height 1.626 m, weight 49.6 kg, SpO2 97 %.      Constitutional: WD/WN, active, NAD.  Eyes: PERRL. EOMI.  No conjunctivitis or discharge.  Visual acuity grossly intact.    HEENT:  NCAT. Ears: No external lesions.   Nose: No external lesions or discharge.    Oropharynx clear, moist mucous membranes.   Neck: Trachea midline. No JVD. Normal ROM. No apparent masses.  Respiratory: Effort normal without accessory muscle  use. Lungs clear to auscultation throughout.   Cardiovascular: RRR, no MRG.  No JVD or significant peripheral edema.   Abdomen:  Soft, non-tender, non-distended. No palpable masses or hernias. No hepatosplenomegaly.  Genitourinary:  No CVA or suprapubic TTP.  Musculoskeletal: Normal range of motion.  Diffuse lumbar paraspinal TTP Pt is able to walk on heels/toes and rise from squat without difficulty.   Strength is 5/5 in quads, hams, iliopsoas, EHL, ankle dorsiflexion and plantarflexion.  Sensation is intact to light touch in L1-S2 dermatomes  Negative straight leg raise.   Patellar reflexes are 2/4 on the right side and 2/4 on the left.  Achilles reflexes are 2/4 on the right side and 2/4 on the left.    Neurological: Pt is alert. CN II-XII grossly intact. Moving all extremities without apparent deficit.   Psychiatric: Affect is appropriate. There is no agitation.   Skin: Skin is warm and dry.  No cyanosis, jaundice or pallor. No rash or lesions noted.         Diagnostic Results     The results of the diagnostic studies below have been reviewed by myself:    Labs  Results     ** No results found for the last 24 hours. **            Radiologic Studies    No results found.             ED Course and Medical Decision Making     ED Medication Orders     None              Prior records reviewed.       Differential diagnosis includes mechanical back pain, lumbosacral radiculopathy, vertebral fracture, epidural compression syndrome/cauda equina syndrome, myelopathy, epidural abscess, diskitis, vertebral osteomyelitis, renal colic, pyelonephritis, AAA, psoas abscess, malignancy.    History and exam consistent with lumbosacral radiculopathy. No suggestion of malignancy, infection, fracture, epidural compression syndrome or any of  the other diseases considered above. Pt advised to avoid activities that exacerbate pain, but to remain as active as possible. Advised to return for new, worsening, bilateral, or unrelenting  symptoms, especially if associated with bowel/bladder dysfunction or weakness. Pt understands and agrees with treatment plan.     In addition to the above history, please see nursing notes. Allergies, meds, past medical, family, social hx, and the results of the diagnostic studies performed have been reviewed by myself.  This chart was generated by an EMR using voice recognition software and may contain errors or omissions not intended by the user.     Procedures / Critical Care     None     Diagnosis / Disposition     Clinical Impression  1. Acute low back pain with sciatica, sciatica laterality unspecified, unspecified back pain laterality        Disposition  ED Disposition     ED Disposition Condition Date/Time Comment    Discharge  Wed Jun 18, 2016  7:06 PM [name] [name] discharge to home/self care.    Condition at disposition: Stable            Follow up for Discharged Patients  Esmond Harps, FNP  [address]  [place] [address]  [phone]          [facility] [place] [facility] Emergency Department  [address]  [place] [address]  [phone]    As needed      Prescriptions for Discharged Patients  New Prescriptions    METHYLPREDNISOLONE (MEDROL DOSPACK) 4 MG TABLET    follow package directions    TRAMADOL (ULTRAM) 50 MG TABLET    Take 1-2 tablets (50-100 mg total) by mouth every 6 (six) hours as needed for Pain.               Lynnae Prude, [place]  06/18/16 Windell Moment

## 2016-06-18 NOTE — Discharge Instructions (Signed)
Back Care Tips    Caring for your back  These are things you can do to prevent a recurrence of acute back pain and to reduce symptoms from chronic back pain:   Maintain a healthy weight. If you are overweight, losing weight will help most types of back pain.   Exercise is an important part of recovery from most types of back pain. The muscles behind and in front of the spine support the back. This means strengthening both the back muscles and the abdominal muscles will provide better support for your spine.   Swimming and brisk walking are good overall exercises to improve your fitness level.   Practice safe lifting methods (below).   Practice good posture when sitting, standing and walking. Avoid prolonged sitting. This puts more stress on the lower back than standing or walking.   Wear quality shoes with sufficient arch support. Foot and ankle alignment can affect back symptoms. Women should avoid wearing high heels.   Therapeutic massage can help relax the back muscles without stretching them.   During the first 24 to 72 hours after an acute injury or flare-up of chronic back pain, apply an ice pack to the painful area for 20 minutes and then remove it for 20 minutes, over a period of 60 to 90 minutes, or several times a day. As a safety precaution, do not use a heating pad at bedtime. Sleeping on a heating pad can lead to skin burns or tissue damage.   You can alternate ice and heat therapies.  Medications  Talk to your healthcare provider before using medicines, especially if you have other medical problems or are taking other medicines.   You may use acetaminophen or ibuprofen to control pain, unless your healthcare provider prescribed other pain medicine. If you have chronic conditions like diabetes, liver or kidney disease, stomach ulcers, or gastrointestinal bleeding, or are taking blood thinners, talk with your healthcare provider before taking any medicines.   Be careful if you are  given prescription pain medicines, narcotics, or medicine for muscle spasm. They can cause drowsiness, affect your coordination, reflexes, and judgment. Do not drive or operate heavy machinery while taking these types of medicines. Take prescription pain medicine only as prescribed by your healthcare provider.  Lumbar stretch  Here is a simple stretching exercise that will help relax muscle spasm and keep your back more limber. If exercise makes your back pain worse, don't do it.   Lie on your back with your knees bent and both feet on the ground.   Slowly raise your left knee to your chest as you flatten your lower back against the floor. Hold for 5 seconds.   Relax and repeat the exercise with your right knee.   Do 10 of these exercises for each leg.  Safe lifting method   Don't bend over at the waist to lift an object off the floor. Instead, bend your knees and hips in a squat.   Keep your back and head upright   Hold the object close to your body, directly in front of you.   Straighten your legs to lift the object.   Lower the object to the floor in the reverse fashion.   If you must slide something across the floor, push it.  Posture tips  Sitting  Sit in chairs with straight backs or low-back support. Keep your knees lower than your hips, with your feet flat on the floor.  When driving, sit up straight. Adjust the   seat forward so you are not leaning toward the steering wheel. A small pillow or rolled towel behind your lower back may help if you are driving long distances.  Standing  When standing for long periods, shift most of your weight to one leg at a time. Alternate legs every few minutes.  Sleeping  The best way to sleep is on your side with your knees bent. Put a low pillow under your head to support your neck in a neutral spine position. Avoid thick pillows that bend your neck to one side. Put a pillow between your legs to further relax your lower back. If you sleep on your back, put  pillows under your knees to support your legs in a slightly flexed position. Use a firm mattress. If your mattress sags, replace it, or use a 1/2-inch plywood board under the mattress to add support.  Follow-up care  Follow up with yourhealthcare provider, or as advised.  If X-rays, a CT scan or an MRI scan were taken, they will be reviewed by a radiologist. You will be notified of any new findings that may affect your care.  Call 911  Seek emergency medical care if any of the following occur:   Trouble breathing   Confusion   Very drowsy   Fainting or loss of consciousness   Rapid or very slow heart rate   Loss of bowel or bladder control  When to seek medical care  Call your healthcare provider if any of the following occur:   Pain becomes worse or spreads to your arms or legs   Weakness or numbness in one or both arms or legs   Numbness in the groin area  Date Last Reviewed: 03/07/2015   2000-2016 [employer] [address], [place] [address]. All rights reserved. This information is not intended as a substitute for professional medical care. Always follow your healthcare professional's instructions.      Thank you for choosing [facility] for your emergency care needs. We strive to provide EXCELLENT care to you and your family.      YOUR ACCURATE CONTACT INFORMATION IS VERY IMPORTANT    Before leaving please check with registration to make sure we have an up-to-date contact number. A Toll-free post discharge customer service number is available to update your registration/insurance information as well as answer any billing questions or concerns. That number is [phone].       IF YOU DO NOT CONTINUE TO IMPROVE OR YOUR CONDITION WORSENS, PLEASE CONTACT YOUR DOCTOR OR RETURN IMMEDIATELY TO THE EMERGENCEY DEPARTMENT.      EXTRA AVAILABLE RESOURCES:    1. DOCTOR REFERRALS  a. Call  our Physician Referral Line @ [phone] If you need  further assistance with  referrals to primary care or specialty services our Case Manager may assist at [phone].  2. FREE HEALTH SERVICES  a. www.freemedicalsearch.org  b. http://www.[facility].org  May be utilized if you need help with health or social services, please call 2-1-1 for a free referral to resources in your area. 2-1-1 is a free service connecting people with information on health insurance, free clinics, pregnancy, mental health, dental care, food assistance, housing, and substance abuse counseling.  3. MEDICAL RECORDS AND TESTS  Certain laboratory test results do not come back the same day, for example: urine cultures may take 3 days. We will attempt to contact you if other important findings are noted. Some lab testing may take 2-5 days. Radiology films   are reviewed again to ensure accuracy. If there is any discrepancy, we will notify you. If you have questions or concerns, our Case Manager is available Monday thru Friday, 7:30a-3:00p, for follow up or test result questions. Please call [phone].  4. ED PATIENT ADVOCATE SERVICES: [phone]. Contact for general questions and concerns, daily 11:00a-11:00p.      DISCHARGE MESSAGE:     YOU ARE THE MOST IMPORTANT FACTOR IN YOUR RECOVERY. Follow the above instructions carefully. Take your medicines as prescribed. Most important, see your doctor in follow-up as recommended by your ED physician.  Call your doctor if your condition worsens or if you have any new, worsening, or severe symptoms. If you need further care and cannot be seen by your recommended follow-up doctor, the Emergency Department Case Manager will be available to help as needed [phone].  If you require immediate assistance, return to the Emergency Department or call 911.    Pharmacy information  For your convenience please visit [facility] for your prescription needs. [facility] has extended evening and weekend hours, including holidays, to ensure patients can begin taking  medications as soon as possible.  Most insurance plans are accepted.    Pharmacy Hours:  Monday - Friday: 8:30a to 8:30p  Saturday: 9a to 1p  Sunday: 9a to 1p and 6p to 9p  Holidays: 9a to 1p    Pharmacy Phone: [phone]      [facility] has been providing home care solutions for independent living since 1984. Servicing [place] and [place]. [facility] is a full service home medical provider of home oxygen and respiratory care, medical equipment and supplies. [phone].    Thanks Again, for allowing [facility]   Emergency Department to serve you.

## 2016-06-18 NOTE — ED Triage Notes (Addendum)
Pt c/o back pain for 3 days. Denies any injuries to the site. States now he is having some tingling in his L leg and R foot. States he had back surgery last year and has chronic back problems.

## 2016-08-11 ENCOUNTER — Emergency Department: Payer: Auto Insurance (includes no fault)

## 2016-08-11 ENCOUNTER — Emergency Department
Admission: EM | Admit: 2016-08-11 | Discharge: 2016-08-11 | Disposition: A | Discharge status: Home / Self Care | Payer: Auto Insurance (includes no fault) | Attending: Physician Assistant | Admitting: Physician Assistant

## 2016-08-11 ENCOUNTER — Emergency Department: Payer: Self-pay

## 2016-08-11 DIAGNOSIS — S39012A Strain of muscle, fascia and tendon of lower back, initial encounter: Secondary | ICD-10-CM

## 2016-08-11 DIAGNOSIS — M47816 Spondylosis without myelopathy or radiculopathy, lumbar region: Secondary | ICD-10-CM | POA: Insufficient documentation

## 2016-08-11 DIAGNOSIS — M19011 Primary osteoarthritis, right shoulder: Secondary | ICD-10-CM

## 2016-08-11 DIAGNOSIS — S161XXA Strain of muscle, fascia and tendon at neck level, initial encounter: Secondary | ICD-10-CM

## 2016-08-11 MED ORDER — HYDROCODONE-ACETAMINOPHEN 5-325 MG PO TABS
ORAL_TABLET | ORAL | Status: AC
Start: 2016-08-11 — End: ?
  Filled 2016-08-11: qty 1

## 2016-08-11 MED ORDER — METHOCARBAMOL 500 MG PO TABS
500.0000 mg | ORAL_TABLET | Freq: Four times a day (QID) | ORAL | 0 refills | Status: DC | PRN
Start: 2016-08-11 — End: 2016-10-10

## 2016-08-11 MED ORDER — HYDROCODONE-ACETAMINOPHEN 5-325 MG PO TABS
1.0000 | ORAL_TABLET | Freq: Once | ORAL | Status: AC
Start: 2016-08-11 — End: 2016-08-11
  Administered 2016-08-11: 1 via ORAL
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
## 2016-08-11 NOTE — ED Notes (Signed)
Bed: S22-A  Expected date:   Expected time:   Means of arrival:   Comments:  Ems

## 2016-08-11 NOTE — ED Notes (Signed)
PT. To xray.

## 2016-08-11 NOTE — Discharge Instructions (Signed)
Understanding Cervical Strain    There are 7 bones (vertebrae) in the neck that are part of the spine. These are called the cervical spine. Cervical strain is a medical term for neck pain. The neck has several layers of muscles. These are connected with tendons to the cervical spine and other bones. Neck pain is often the result of injury to these muscles and tendons.  Causes of cervical strain  Different types of stress on the neck can damage muscles and tendons (soft tissues) and cause cervical strain. Cervical tissues can be damaged by:   The neck being forced past its normal range of motion, such as in a car accident or sports injury   Constant, low-level stress, such as from poor posture or a poorly set-up workspace  Symptoms of cervical strain  These may include:   Neck pain or stiffness   Pain in the shoulders or upper back   Muscle spasms   Headache, often starting at the base of the neck   Irritability, difficulty concentrating, or sleeplessness  Treatment for cervical strain  This problem often gets better on its own. Treatments aim to reduce pain and inflammation and increase the range of motion of the neck. Possible treatments include:   Over-the-counter or prescription pain medicine. These help relieve pain and inflammation.   Stretching exercises to decrease neck stiffness.   Massage to decrease neck stiffness.   Cold or heat pack. These help reduce pain and swelling.  Call 911  Call emergency services right away if you have any of these:   Face drooping or numbness   Numbness or weakness, especially in the arms or on one side   Slurred speech or difficulty speaking   Blurred vision   When to call your healthcare provider  Call your healthcare provider right away if you have any of these:   Fever of 100.4F (38C) or higher, or as directed   Pain or stiffness that gets worse   Symptoms that don't get better, or get worse   Numbness, tingling, weakness or shooting pains into the  arms or legs   New symptoms  Date Last Reviewed: 12/14/2014   2000-2016 [employer] [address], [place] [address]. All rights reserved. This information is not intended as a substitute for professional medical care. Always follow your healthcare professional's instructions.      Thank you for choosing [facility] for your emergency care needs. We strive to provide EXCELLENT care to you and your family.      YOUR ACCURATE CONTACT INFORMATION IS VERY IMPORTANT    Before leaving please check with registration to make sure we have an up-to-date contact number. A Toll-free post discharge customer service number is available to update your registration/insurance information as well as answer any billing questions or concerns. That number is [phone].       IF YOU DO NOT CONTINUE TO IMPROVE OR YOUR CONDITION WORSENS, PLEASE CONTACT YOUR DOCTOR OR RETURN IMMEDIATELY TO THE EMERGENCEY DEPARTMENT.      EXTRA AVAILABLE RESOURCES:    1. DOCTOR REFERRALS  a. Call  our Physician Referral Line @ [phone] If you need  further assistance with referrals to primary care or specialty services our Case Manager may assist at [phone].  2. FREE HEALTH SERVICES  a. www.freemedicalsearch.org  b. http://www.[facility].org  May be utilized if you need help with health or social services, please call 2-1-1 for a free referral to resources in your   area. 2-1-1 is a free service connecting people with information on health insurance, free clinics, pregnancy, mental health, dental care, food assistance, housing, and substance abuse counseling.  3. MEDICAL RECORDS AND TESTS  Certain laboratory test results do not come back the same day, for example: urine cultures may take 3 days. We will attempt to contact you if other important findings are noted. Some lab testing may take 2-5 days. Radiology films are reviewed again to ensure accuracy. If there is any discrepancy, we will notify you.  If you have questions or concerns, our Case Manager is available Monday thru Friday, 7:30a-3:00p, for follow up or test result questions. Please call [phone].  4. ED PATIENT ADVOCATE SERVICES: [phone]. Contact for general questions and concerns, daily 11:00a-11:00p.      DISCHARGE MESSAGE:     YOU ARE THE MOST IMPORTANT FACTOR IN YOUR RECOVERY. Follow the above instructions carefully. Take your medicines as prescribed. Most important, see your doctor in follow-up as recommended by your ED physician.  Call your doctor if your condition worsens or if you have any new, worsening, or severe symptoms. If you need further care and cannot be seen by your recommended follow-up doctor, the Emergency Department Case Manager will be available to help as needed [phone].  If you require immediate assistance, return to the Emergency Department or call 911.    Pharmacy information  For your convenience please visit [facility] for your prescription needs. [facility] has extended evening and weekend hours, including holidays, to ensure patients can begin taking medications as soon as possible.  Most insurance plans are accepted.    Pharmacy Hours:  Monday - Friday: 8:30a to 8:30p  Saturday: 9a to 1p  Sunday: 9a to 1p and 6p to 9p  Holidays: 9a to 1p    Pharmacy Phone: [phone]      [facility] has been providing home care solutions for independent living since 1984. Servicing [place] and [place]. [facility] is a full service home medical provider of home oxygen and respiratory care, medical equipment and supplies. [phone].    Thanks Again, for allowing [facility]   Emergency Department to serve you.      Internal Medicine and Family Practices Accepting New Patients  [facility] [phone]    [facility] [phone]    [facility] [phone] Medicaid accepted   [facility] [phone]    [facility] [phone] Medicaid accepted   [facility] [phone]    [facility] [phone] Walk in clinic available   [facility] [phone] Medicaid accepted   [facility] [phone]    [facility] [phone] Medicaid accepted  Sliding scale available   [facility]  [phone]    [facility] [phone]    [facility] [phone] ([phone])    [facility] [phone] Spanish spoken   [facility]  [phone] [place] Medicaid accepted  Sliding scale available   [facility] [phone] Medicaid accepted  Spanish spoken   [facility] [phone]

## 2016-08-12 ENCOUNTER — Emergency Department: Payer: Auto Insurance (includes no fault)

## 2016-08-12 ENCOUNTER — Emergency Department
Admission: EM | Admit: 2016-08-12 | Discharge: 2016-08-12 | Disposition: A | Discharge status: Home / Self Care | Payer: Auto Insurance (includes no fault) | Attending: Emergency Medicine | Admitting: Emergency Medicine

## 2016-08-12 DIAGNOSIS — M545 Low back pain: Secondary | ICD-10-CM | POA: Insufficient documentation

## 2016-08-12 MED ORDER — HYDROCODONE-ACETAMINOPHEN 5-325 MG PO TABS
1.0000 | ORAL_TABLET | Freq: Four times a day (QID) | ORAL | 0 refills | Status: DC | PRN
Start: 2016-08-12 — End: 2016-10-10

## 2016-08-12 MED ORDER — METHYLPREDNISOLONE 4 MG PO TBPK
ORAL_TABLET | ORAL | 0 refills | Status: DC
Start: 2016-08-12 — End: 2016-10-10

## 2016-08-12 NOTE — Discharge Instructions (Signed)
Back Care Tips    Caring for your back  These are things you can do to prevent a recurrence of acute back pain and to reduce symptoms from chronic back pain:   Maintain a healthy weight. If you are overweight, losing weight will help most types of back pain.   Exercise is an important part of recovery from most types of back pain. The muscles behind and in front of the spine support the back. This means strengthening both the back muscles and the abdominal muscles will provide better support for your spine.   Swimming and brisk walking are good overall exercises to improve your fitness level.   Practice safe lifting methods (below).   Practice good posture when sitting, standing and walking. Avoid prolonged sitting. This puts more stress on the lower back than standing or walking.   Wear quality shoes with sufficient arch support. Foot and ankle alignment can affect back symptoms. Women should avoid wearing high heels.   Therapeutic massage can help relax the back muscles without stretching them.   During the first 24 to 72 hours after an acute injury or flare-up of chronic back pain, apply an ice pack to the painful area for 20 minutes and then remove it for 20 minutes, over a period of 60 to 90 minutes, or several times a day. As a safety precaution, do not use a heating pad at bedtime. Sleeping on a heating pad can lead to skin burns or tissue damage.   You can alternate ice and heat therapies.  Medications  Talk to your healthcare provider before using medicines, especially if you have other medical problems or are taking other medicines.   You may use acetaminophen or ibuprofen to control pain, unless your healthcare provider prescribed other pain medicine. If you have chronic conditions like diabetes, liver or kidney disease, stomach ulcers, or gastrointestinal bleeding, or are taking blood thinners, talk with your healthcare provider before taking any medicines.   Be careful if you are  given prescription pain medicines, narcotics, or medicine for muscle spasm. They can cause drowsiness, affect your coordination, reflexes, and judgment. Do not drive or operate heavy machinery while taking these types of medicines. Take prescription pain medicine only as prescribed by your healthcare provider.  Lumbar stretch  Here is a simple stretching exercise that will help relax muscle spasm and keep your back more limber. If exercise makes your back pain worse, don't do it.   Lie on your back with your knees bent and both feet on the ground.   Slowly raise your left knee to your chest as you flatten your lower back against the floor. Hold for 5 seconds.   Relax and repeat the exercise with your right knee.   Do 10 of these exercises for each leg.  Safe lifting method   Don't bend over at the waist to lift an object off the floor. Instead, bend your knees and hips in a squat.   Keep your back and head upright   Hold the object close to your body, directly in front of you.   Straighten your legs to lift the object.   Lower the object to the floor in the reverse fashion.   If you must slide something across the floor, push it.  Posture tips  Sitting  Sit in chairs with straight backs or low-back support. Keep your knees lower than your hips, with your feet flat on the floor.  When driving, sit up straight. Adjust the   seat forward so you are not leaning toward the steering wheel. A small pillow or rolled towel behind your lower back may help if you are driving long distances.  Standing  When standing for long periods, shift most of your weight to one leg at a time. Alternate legs every few minutes.  Sleeping  The best way to sleep is on your side with your knees bent. Put a low pillow under your head to support your neck in a neutral spine position. Avoid thick pillows that bend your neck to one side. Put a pillow between your legs to further relax your lower back. If you sleep on your back, put  pillows under your knees to support your legs in a slightly flexed position. Use a firm mattress. If your mattress sags, replace it, or use a 1/2-inch plywood board under the mattress to add support.  Follow-up care  Follow up with yourhealthcare provider, or as advised.  If X-rays, a CT scan or an MRI scan were taken, they will be reviewed by a radiologist. You will be notified of any new findings that may affect your care.  Call 911  Seek emergency medical care if any of the following occur:   Trouble breathing   Confusion   Very drowsy   Fainting or loss of consciousness   Rapid or very slow heart rate   Loss of bowel or bladder control  When to seek medical care  Call your healthcare provider if any of the following occur:   Pain becomes worse or spreads to your arms or legs   Weakness or numbness in one or both arms or legs   Numbness in the groin area  Date Last Reviewed: 03/07/2015   2000-2016 [employer] [address], [place] [address]. All rights reserved. This information is not intended as a substitute for professional medical care. Always follow your healthcare professional's instructions.      Thank you for choosing [facility] for your emergency care needs. We strive to provide EXCELLENT care to you and your family.      YOUR ACCURATE CONTACT INFORMATION IS VERY IMPORTANT    Before leaving please check with registration to make sure we have an up-to-date contact number. A Toll-free post discharge customer service number is available to update your registration/insurance information as well as answer any billing questions or concerns. That number is [phone].       IF YOU DO NOT CONTINUE TO IMPROVE OR YOUR CONDITION WORSENS, PLEASE CONTACT YOUR DOCTOR OR RETURN IMMEDIATELY TO THE EMERGENCEY DEPARTMENT.      EXTRA AVAILABLE RESOURCES:    1. DOCTOR REFERRALS  a. Call  our Physician Referral Line @ [phone] If you need  further assistance with  referrals to primary care or specialty services our Case Manager may assist at [phone].  2. FREE HEALTH SERVICES  a. www.freemedicalsearch.org  b. http://www.[facility].org  May be utilized if you need help with health or social services, please call 2-1-1 for a free referral to resources in your area. 2-1-1 is a free service connecting people with information on health insurance, free clinics, pregnancy, mental health, dental care, food assistance, housing, and substance abuse counseling.  3. MEDICAL RECORDS AND TESTS  Certain laboratory test results do not come back the same day, for example: urine cultures may take 3 days. We will attempt to contact you if other important findings are noted. Some lab testing may take 2-5 days. Radiology films   are reviewed again to ensure accuracy. If there is any discrepancy, we will notify you. If you have questions or concerns, our Case Manager is available Monday thru Friday, 7:30a-3:00p, for follow up or test result questions. Please call [phone].  4. ED PATIENT ADVOCATE SERVICES: [phone]. Contact for general questions and concerns, daily 11:00a-11:00p.      DISCHARGE MESSAGE:     YOU ARE THE MOST IMPORTANT FACTOR IN YOUR RECOVERY. Follow the above instructions carefully. Take your medicines as prescribed. Most important, see your doctor in follow-up as recommended by your ED physician.  Call your doctor if your condition worsens or if you have any new, worsening, or severe symptoms. If you need further care and cannot be seen by your recommended follow-up doctor, the Emergency Department Case Manager will be available to help as needed [phone].  If you require immediate assistance, return to the Emergency Department or call 911.    Pharmacy information  For your convenience please visit [facility] for your prescription needs. [facility] has extended evening and weekend hours, including holidays, to ensure patients can begin taking  medications as soon as possible.  Most insurance plans are accepted.    Pharmacy Hours:  Monday - Friday: 8:30a to 8:30p  Saturday: 9a to 1p  Sunday: 9a to 1p and 6p to 9p  Holidays: 9a to 1p    Pharmacy Phone: [phone]      [facility] has been providing home care solutions for independent living since 1984. [place] . [facility] is a full service home medical provider of home oxygen and respiratory care, medical equipment and supplies. [phone].    Thanks Again, for allowing [facility]   Emergency Department to serve you.
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
## 2016-08-14 ENCOUNTER — Encounter: Payer: Self-pay | Admitting: Neurological Surgery

## 2016-08-14 ENCOUNTER — Encounter: Payer: Self-pay | Admitting: Internal Medicine

## 2016-08-14 DIAGNOSIS — M5412 Radiculopathy, cervical region: Secondary | ICD-10-CM

## 2016-08-14 DIAGNOSIS — M461 Sacroiliitis, not elsewhere classified: Secondary | ICD-10-CM

## 2016-08-15 ENCOUNTER — Encounter (HOSPITAL_BASED_OUTPATIENT_CLINIC_OR_DEPARTMENT_OTHER)
Admission: RE | Disposition: A | Discharge status: Home / Self Care | Payer: Self-pay | Source: Ambulatory Visit | Attending: Gastroenterology

## 2016-08-15 ENCOUNTER — Encounter (HOSPITAL_BASED_OUTPATIENT_CLINIC_OR_DEPARTMENT_OTHER): Payer: Self-pay

## 2016-08-15 ENCOUNTER — Ambulatory Visit (HOSPITAL_BASED_OUTPATIENT_CLINIC_OR_DEPARTMENT_OTHER): Payer: Medicare Other | Admitting: Anesthesiology

## 2016-08-15 ENCOUNTER — Ambulatory Visit (HOSPITAL_BASED_OUTPATIENT_CLINIC_OR_DEPARTMENT_OTHER): Payer: Medicare Other | Admitting: Gastroenterology

## 2016-08-15 ENCOUNTER — Ambulatory Visit
Admission: RE | Admit: 2016-08-15 | Discharge: 2016-08-15 | Disposition: A | Discharge status: Home / Self Care | Payer: Medicare Other | Source: Ambulatory Visit | Attending: Gastroenterology | Admitting: Gastroenterology

## 2016-08-15 DIAGNOSIS — K5730 Diverticulosis of large intestine without perforation or abscess without bleeding: Secondary | ICD-10-CM | POA: Insufficient documentation

## 2016-08-15 DIAGNOSIS — Z1211 Encounter for screening for malignant neoplasm of colon: Secondary | ICD-10-CM | POA: Insufficient documentation

## 2016-08-15 DIAGNOSIS — N400 Enlarged prostate without lower urinary tract symptoms: Secondary | ICD-10-CM | POA: Insufficient documentation

## 2016-08-15 HISTORY — PX: COLONOSCOPY: SHX174

## 2016-08-15 SURGERY — DONT USE, USE 1094-COLONOSCOPY, DIAGNOSTIC (SCREENING)
Anesthesia: Anesthesia MAC / Sedation | Site: Anus | Wound class: Clean Contaminated

## 2016-08-15 MED ORDER — ONDANSETRON 4 MG PO TBDP
4.0000 mg | ORAL_TABLET | ORAL | Status: DC | PRN
Start: 2016-08-15 — End: 2016-08-15

## 2016-08-15 MED ORDER — ONDANSETRON HCL 4 MG/2ML IJ SOLN
4.0000 mg | INTRAMUSCULAR | Status: DC | PRN
Start: 2016-08-15 — End: 2016-08-15

## 2016-08-15 MED ORDER — PROPOFOL 200 MG/20ML IV EMUL
INTRAVENOUS | Status: DC | PRN
Start: 2016-08-15 — End: 2016-08-15
  Administered 2016-08-15: 40 mg via INTRAVENOUS
  Administered 2016-08-15: 80 mg via INTRAVENOUS
  Administered 2016-08-15: 30 mg via INTRAVENOUS

## 2016-08-15 MED ORDER — PROPOFOL 200 MG/20ML IV EMUL
INTRAVENOUS | Status: AC
Start: 2016-08-15 — End: ?
  Filled 2016-08-15: qty 20

## 2016-08-15 MED ORDER — SODIUM CHLORIDE 0.9 % IV SOLN
INTRAVENOUS | Status: DC
Start: 2016-08-15 — End: 2016-08-15

## 2016-08-15 MED ORDER — PEG 3350-KCL-NABCB-NACL-NASULF 236 G PO SOLR
4.0000 L | ORAL | Status: DC | PRN
Start: 2016-08-15 — End: 2016-08-15

## 2016-08-15 SURGICAL SUPPLY — 9 items
FORCEP BIOPSY HOT RADIAL JAW 4 (Supply) IMPLANT
FORCEP BIOPSY RAD JAW 1333-40 (Supply) IMPLANT
FORCEP RADIAL JAW JUMBO 240CM (Supply) IMPLANT
MARKER ENDOSCOPIC SPOT (Supply) IMPLANT
NDL INTERJECT SCLERO 25G (Supply) IMPLANT
SNARE CAPII STIFF 10MM RD 40BX (Supply) IMPLANT
SNARE ROTATE SM OVAL MED STFF (Supply) IMPLANT
SNARE SMALL CAPTIV 6230 (Supply) IMPLANT
TUBING IRRIGATION TORRENT (Supply) ×2 IMPLANT

## 2016-08-15 NOTE — Transfer of Care (Signed)
Anesthesia Transfer of Care Note    Patient: [name] [name]    Last vitals:   Vitals:    08/15/16 1316   BP: 114/72   Pulse: (!) 58   Resp: 22   Temp:    SpO2: 98%       Oxygen: Nasal Cannula     Mental Status:sedated    Airway: Natural    Cardiovascular Status:  stable

## 2016-08-15 NOTE — Anesthesia Preprocedure Evaluation (Signed)
Anesthesia Evaluation    AIRWAY    Mallampati: I    TM distance: >3 FB  Neck ROM: full  Mouth Opening:full   CARDIOVASCULAR    cardiovascular exam normal       DENTAL    no notable dental hx     PULMONARY    pulmonary exam normal     OTHER FINDINGS              Relevant Problems   No active problems are marked relevant to this note.               Anesthesia Plan    ASA 2     MAC                                 informed consent obtained                   Signed by: Suszanne Finch 08/15/16 12:49 PM

## 2016-08-15 NOTE — Discharge Instr - AVS First Page (Signed)
[facility]  Colonoscopy/Sigmoidoscopy  Discharge Instructions    Thank you for allowing us to be a part of your health care experience.  We realize you may not fully recall your discharge care.  The following information is to guide you.    A.  After you leave the hospital:   1.  Due to the effects of the sedatives, you may feel tired for the remainder of today.   2.  DO NOT DRIVE OR OPERATE HAZARDOUS MACHINERY until tomorrow.   3.  Please rest and drink extra fluids today.  Avoid alcohol today.   4.  Resume your normal diet as tolerated.   5. If you experience anal soreness, you may apply a soothing ointment of your choice.   6.  You may feel bloated and pass air today.   7.  You may resume your normal activities (work) tomorrow.    B.  If you experience any of the following danger signs or symptoms, call your doctor immediately:  After business hours call [phone] for [facility] for physician guidance.    1.   Passing  Blood in the stool or a change of stool consistency (black).   2.  Passing Clotted blood.   3.  New onset of pain or distension, that does not subside or prevents you from normal activity.   4.  Redness or swelling at the IV insertion site that continues or worsens over 2-3 days. Initial warm compresses may be helpful.    All [facility] us [facility] who supported you today during your procedure wish you a quick and comfortable recovery.  For any questions or concerns about your personal care, contact us at [phone].

## 2016-08-15 NOTE — Brief Op Note (Signed)
See full colonoscopy report in provation MD

## 2016-08-15 NOTE — Anesthesia Postprocedure Evaluation (Signed)
Anesthesia Post Evaluation    Patient: [name] [name]    Procedures performed: Procedure(s):  COLONOSCOPY    Anesthesia type: MAC    Patient location:PACU    Last vitals:   Vitals:    08/15/16 1316   BP: 114/72   Pulse: (!) 58   Resp: 22   Temp:    SpO2: 98%       Post pain: Patient not complaining of pain, continue current therapy      Mental Status:awake    Respiratory Function: tolerating room air    Cardiovascular: stable    Nausea/Vomiting: patient not complaining of nausea or vomiting    Hydration Status: adequate    Post assessment: no apparent anesthetic complications

## 2016-08-16 ENCOUNTER — Encounter (HOSPITAL_BASED_OUTPATIENT_CLINIC_OR_DEPARTMENT_OTHER): Payer: Self-pay | Admitting: Gastroenterology

## 2016-08-19 ENCOUNTER — Ambulatory Visit
Admission: RE | Admit: 2016-08-19 | Discharge: 2016-08-19 | Disposition: A | Discharge status: Home / Self Care | Payer: Medicare Other | Source: Ambulatory Visit | Attending: Neurological Surgery | Admitting: Neurological Surgery

## 2016-08-19 DIAGNOSIS — M461 Sacroiliitis, not elsewhere classified: Secondary | ICD-10-CM | POA: Insufficient documentation

## 2016-08-19 MED ORDER — METHYLPREDNISOLONE ACETATE 40 MG/ML IJ SUSP
40.0000 mg | Freq: Once | INTRAMUSCULAR | Status: AC
Start: 2016-08-19 — End: 2016-08-19
  Administered 2016-08-19: 40 mg via INTRA_ARTICULAR

## 2016-08-19 MED ORDER — LIDOCAINE HCL 1 % IJ SOLN
10.0000 mL | Freq: Once | INTRAMUSCULAR | Status: AC
Start: 2016-08-19 — End: 2016-08-19
  Administered 2016-08-19: 10 mL via INTRADERMAL

## 2016-08-19 MED ORDER — ROPIVACAINE HCL 5 MG/ML IJ SOLN
INTRAMUSCULAR | Status: AC
Start: 2016-08-19 — End: ?
  Filled 2016-08-19: qty 30

## 2016-08-19 MED ORDER — ROPIVACAINE HCL 5 MG/ML IJ SOLN
2.0000 mL | Freq: Once | INTRAMUSCULAR | Status: AC
Start: 2016-08-19 — End: 2016-08-19
  Administered 2016-08-19: 2 mL

## 2016-08-19 MED ORDER — METHYLPREDNISOLONE ACETATE 40 MG/ML IJ SUSP
INTRAMUSCULAR | Status: AC
Start: 2016-08-19 — End: ?
  Filled 2016-08-19: qty 1

## 2016-08-27 ENCOUNTER — Ambulatory Visit (INDEPENDENT_AMBULATORY_CARE_PROVIDER_SITE_OTHER)
Admission: RE | Admit: 2016-08-27 | Discharge: 2016-08-27 | Disposition: A | Discharge status: Home / Self Care | Payer: Medicare Other | Source: Ambulatory Visit | Attending: Internal Medicine | Admitting: Internal Medicine

## 2016-08-27 DIAGNOSIS — M5412 Radiculopathy, cervical region: Secondary | ICD-10-CM

## 2016-09-12 ENCOUNTER — Encounter: Payer: Self-pay | Admitting: Orthopaedic Surgery of the Spine

## 2016-09-12 DIAGNOSIS — M19019 Primary osteoarthritis, unspecified shoulder: Secondary | ICD-10-CM

## 2016-09-16 ENCOUNTER — Ambulatory Visit: Payer: Medicare Other

## 2016-10-02 ENCOUNTER — Ambulatory Visit
Admission: RE | Admit: 2016-10-02 | Discharge: 2016-10-02 | Disposition: A | Discharge status: Home / Self Care | Payer: Medicare Other | Source: Ambulatory Visit | Attending: Orthopaedic Surgery of the Spine | Admitting: Orthopaedic Surgery of the Spine

## 2016-10-02 ENCOUNTER — Other Ambulatory Visit: Payer: Self-pay | Admitting: Orthopaedic Surgery of the Spine

## 2016-10-02 DIAGNOSIS — M65811 Other synovitis and tenosynovitis, right shoulder: Secondary | ICD-10-CM | POA: Insufficient documentation

## 2016-10-02 DIAGNOSIS — M75101 Unspecified rotator cuff tear or rupture of right shoulder, not specified as traumatic: Secondary | ICD-10-CM | POA: Insufficient documentation

## 2016-10-02 DIAGNOSIS — M19019 Primary osteoarthritis, unspecified shoulder: Secondary | ICD-10-CM

## 2016-10-02 DIAGNOSIS — M19011 Primary osteoarthritis, right shoulder: Secondary | ICD-10-CM | POA: Insufficient documentation

## 2016-10-02 MED ORDER — IOHEXOL 240 MG/ML IJ SOLN
2.0000 mL | Freq: Once | INTRAMUSCULAR | Status: AC
Start: 2016-10-02 — End: 2016-10-02
  Administered 2016-10-02: 2 mL

## 2016-10-02 MED ORDER — GADOBUTROL 1 MMOL/ML IV SOLN
1.0000 mL | Freq: Once | INTRAVENOUS | Status: AC
Start: 2016-10-02 — End: 2016-10-02
  Administered 2016-10-02: 1 mmol via INTRAMUSCULAR

## 2016-10-02 MED ORDER — LIDOCAINE HCL 1 % IJ SOLN
10.0000 mL | Freq: Once | INTRAMUSCULAR | Status: AC
Start: 2016-10-02 — End: 2016-10-02
  Administered 2016-10-02: 10 mL via INTRADERMAL

## 2016-10-10 ENCOUNTER — Encounter (AMBULATORY_SURGERY_CENTER): Payer: Self-pay

## 2016-10-10 ENCOUNTER — Encounter: Payer: Self-pay | Admitting: Orthopaedic Surgery

## 2016-10-10 ENCOUNTER — Ambulatory Visit
Admission: RE | Admit: 2016-10-10 | Discharge: 2016-10-10 | Disposition: A | Discharge status: Home / Self Care | Payer: Medicare Other | Source: Ambulatory Visit

## 2016-10-10 ENCOUNTER — Ambulatory Visit
Admission: RE | Admit: 2016-10-10 | Discharge: 2016-10-10 | Disposition: A | Discharge status: Home / Self Care | Payer: Medicare Other | Source: Ambulatory Visit | Attending: Orthopaedic Surgery | Admitting: Orthopaedic Surgery

## 2016-10-10 DIAGNOSIS — M75101 Unspecified rotator cuff tear or rupture of right shoulder, not specified as traumatic: Secondary | ICD-10-CM

## 2016-10-10 LAB — ELECTROLYTE PANEL
Anion Gap: 10.1 mMol/L (ref 7.0–18.0)
CO2: 29.3 mMol/L (ref 20.0–30.0)
Chloride: 105 mMol/L (ref 98–110)
Potassium: 4.4 mMol/L (ref 3.5–5.3)
Sodium: 140 mMol/L (ref 136–147)

## 2016-10-10 LAB — CBC AND DIFFERENTIAL
Basophils %: 0.9 % (ref 0.0–3.0)
Basophils Absolute: 0 10*3/uL (ref 0.0–0.3)
Eosinophils %: 5.3 % (ref 0.0–7.0)
Eosinophils Absolute: 0.2 10*3/uL (ref 0.0–0.8)
Hematocrit: 41 % (ref 39.0–52.5)
Hemoglobin: 13.9 gm/dL (ref 13.0–17.5)
Lymphocytes Absolute: 1.8 10*3/uL (ref 0.6–5.1)
Lymphocytes: 38.4 % (ref 15.0–46.0)
MCH: 28 pg (ref 28–35)
MCHC: 34 gm/dL (ref 32–36)
MCV: 84 fL (ref 80–100)
MPV: 10.1 fL — ABNORMAL HIGH (ref 6.0–10.0)
Monocytes Absolute: 0.5 10*3/uL (ref 0.1–1.7)
Monocytes: 11 % (ref 3.0–15.0)
Neutrophils %: 44.5 % (ref 42.0–78.0)
Neutrophils Absolute: 2.1 10*3/uL (ref 1.7–8.6)
PLT CT: 166 10*3/uL (ref 130–440)
RBC: 4.91 10*6/uL (ref 4.00–5.70)
RDW: 12.6 % (ref 11.0–14.0)
WBC: 4.7 10*3/uL (ref 4.0–11.0)

## 2016-10-11 LAB — ECG 12-LEAD
P Wave Axis: 65 deg
P-R Interval: 133 ms
Patient Age: 55 years
Q-T Interval(Corrected): 372 ms
Q-T Interval: 357 ms
QRS Axis: 22 deg
QRS Duration: 85 ms
T Axis: 61 years
Ventricular Rate: 65 //min

## 2016-10-16 ENCOUNTER — Encounter (INDEPENDENT_AMBULATORY_CARE_PROVIDER_SITE_OTHER): Payer: Self-pay

## 2016-10-16 ENCOUNTER — Ambulatory Visit (INDEPENDENT_AMBULATORY_CARE_PROVIDER_SITE_OTHER): Payer: Self-pay | Admitting: Physical Medicine & Rehabilitation

## 2016-10-24 ENCOUNTER — Ambulatory Visit
Admission: RE | Admit: 2016-10-24 | Discharge: 2016-10-24 | Disposition: A | Discharge status: Home / Self Care | Payer: Medicare Other | Source: Ambulatory Visit | Attending: Orthopaedic Surgery | Admitting: Orthopaedic Surgery

## 2016-10-24 ENCOUNTER — Ambulatory Visit (AMBULATORY_SURGERY_CENTER): Payer: Medicare Other | Admitting: Anesthesiology

## 2016-10-24 ENCOUNTER — Ambulatory Visit (AMBULATORY_SURGERY_CENTER): Payer: Medicare Other | Admitting: Orthopaedic Surgery

## 2016-10-24 ENCOUNTER — Encounter (AMBULATORY_SURGERY_CENTER): Payer: Self-pay

## 2016-10-24 ENCOUNTER — Encounter (AMBULATORY_SURGERY_CENTER)
Admission: RE | Disposition: A | Discharge status: Home / Self Care | Payer: Self-pay | Source: Ambulatory Visit | Attending: Orthopaedic Surgery

## 2016-10-24 DIAGNOSIS — Z7982 Long term (current) use of aspirin: Secondary | ICD-10-CM | POA: Insufficient documentation

## 2016-10-24 DIAGNOSIS — M75101 Unspecified rotator cuff tear or rupture of right shoulder, not specified as traumatic: Secondary | ICD-10-CM | POA: Diagnosis present

## 2016-10-24 DIAGNOSIS — Z8249 Family history of ischemic heart disease and other diseases of the circulatory system: Secondary | ICD-10-CM | POA: Insufficient documentation

## 2016-10-24 DIAGNOSIS — Z885 Allergy status to narcotic agent status: Secondary | ICD-10-CM | POA: Insufficient documentation

## 2016-10-24 DIAGNOSIS — M7541 Impingement syndrome of right shoulder: Secondary | ICD-10-CM | POA: Insufficient documentation

## 2016-10-24 DIAGNOSIS — G8929 Other chronic pain: Secondary | ICD-10-CM | POA: Insufficient documentation

## 2016-10-24 DIAGNOSIS — F17210 Nicotine dependence, cigarettes, uncomplicated: Secondary | ICD-10-CM | POA: Insufficient documentation

## 2016-10-24 DIAGNOSIS — Z888 Allergy status to other drugs, medicaments and biological substances status: Secondary | ICD-10-CM | POA: Insufficient documentation

## 2016-10-24 DIAGNOSIS — G809 Cerebral palsy, unspecified: Secondary | ICD-10-CM | POA: Insufficient documentation

## 2016-10-24 DIAGNOSIS — Z833 Family history of diabetes mellitus: Secondary | ICD-10-CM | POA: Insufficient documentation

## 2016-10-24 HISTORY — PX: ARTHROSCOPIC SHOULDER SUBSCAPULARIS REPAIR: SHX51025

## 2016-10-24 HISTORY — PX: ARTHROSCOPY, SHOULDER, MUMFORD/DCE: SHX510709

## 2016-10-24 HISTORY — PX: ARTHROSCOPY SHOULDER & ROTATOR CUFF REPAIR MINI OPEN: SHX51044

## 2016-10-24 HISTORY — PX: ARTHROSCOPY SHOULDER W/ DEBRIDEMENT: SHX51046

## 2016-10-24 SURGERY — ARTHROSCOPY, SHOULDER, ROTATOR CUFF REPAIR
Anesthesia: Anesthesia General | Site: Shoulder | Laterality: Right | Wound class: Clean

## 2016-10-24 MED ORDER — KETOROLAC TROMETHAMINE 30 MG/ML IJ SOLN
INTRAMUSCULAR | Status: AC
Start: 2016-10-24 — End: ?
  Filled 2016-10-24: qty 1

## 2016-10-24 MED ORDER — VH PHENYLEPHRINE 120 MCG/ML IV BOLUS (ANESTHESIA)
PREFILLED_SYRINGE | INTRAVENOUS | Status: AC
Start: 2016-10-24 — End: ?
  Filled 2016-10-24: qty 20

## 2016-10-24 MED ORDER — VH PHENYLEPHRINE 120 MCG/ML IV BOLUS (ANESTHESIA)
PREFILLED_SYRINGE | INTRAVENOUS | Status: DC | PRN
Start: 2016-10-24 — End: 2016-10-24
  Administered 2016-10-24: 60 ug via INTRAVENOUS
  Administered 2016-10-24 (×2): 120 ug via INTRAVENOUS

## 2016-10-24 MED ORDER — ACETAMINOPHEN 10 MG/ML IV SOLN
INTRAVENOUS | Status: DC | PRN
Start: 2016-10-24 — End: 2016-10-24
  Administered 2016-10-24: 1000 mg via INTRAVENOUS

## 2016-10-24 MED ORDER — ROCURONIUM BROMIDE 50 MG/5ML IV SOLN
INTRAVENOUS | Status: DC | PRN
Start: 2016-10-24 — End: 2016-10-24
  Administered 2016-10-24: 50 mg via INTRAVENOUS

## 2016-10-24 MED ORDER — PROPOFOL INFUSION 10 MG/ML
INTRAVENOUS | Status: DC | PRN
Start: 2016-10-24 — End: 2016-10-24
  Administered 2016-10-24: 120 mg via INTRAVENOUS
  Administered 2016-10-24: 30 mg via INTRAVENOUS
  Administered 2016-10-24: 20 mg via INTRAVENOUS

## 2016-10-24 MED ORDER — LACTATED RINGERS IV SOLN
INTRAVENOUS | Status: DC
Start: 2016-10-24 — End: 2016-10-24

## 2016-10-24 MED ORDER — LIDOCAINE HCL 2 % IJ SOLN
INTRAMUSCULAR | Status: AC
Start: 2016-10-24 — End: ?
  Filled 2016-10-24: qty 20

## 2016-10-24 MED ORDER — FENTANYL CITRATE (PF) 50 MCG/ML IJ SOLN (WRAP)
INTRAMUSCULAR | Status: AC
Start: 2016-10-24 — End: ?
  Filled 2016-10-24: qty 2

## 2016-10-24 MED ORDER — HYDROMORPHONE HCL 2 MG/ML IJ SOLN
0.5000 mg | Freq: Once | INTRAMUSCULAR | Status: AC
Start: 2016-10-24 — End: 2016-10-24
  Administered 2016-10-24: 0.5 mg via INTRAVENOUS
  Filled 2016-10-24: qty 1

## 2016-10-24 MED ORDER — MIDAZOLAM HCL 2 MG/2ML IJ SOLN
INTRAMUSCULAR | Status: DC | PRN
Start: 2016-10-24 — End: 2016-10-24
  Administered 2016-10-24: 2 mg via INTRAVENOUS

## 2016-10-24 MED ORDER — OXYCODONE HCL 5 MG PO TABS
5.0000 mg | ORAL_TABLET | Freq: Once | ORAL | Status: AC | PRN
Start: 2016-10-24 — End: 2016-10-24
  Administered 2016-10-24: 5 mg via ORAL
  Filled 2016-10-24: qty 1

## 2016-10-24 MED ORDER — NEOSTIGMINE METHYLSULFATE 1 MG/ML IJ SOLN
INTRAMUSCULAR | Status: DC | PRN
Start: 2016-10-24 — End: 2016-10-24
  Administered 2016-10-24: 2 mg via INTRAVENOUS

## 2016-10-24 MED ORDER — GLYCOPYRROLATE 0.2 MG/ML IJ SOLN
INTRAMUSCULAR | Status: DC | PRN
Start: 2016-10-24 — End: 2016-10-24
  Administered 2016-10-24: 0.1 mg via INTRAVENOUS
  Administered 2016-10-24: .3 mg via INTRAVENOUS

## 2016-10-24 MED ORDER — MEPERIDINE HCL 25 MG/ML IJ SOLN
12.5000 mg | Freq: Once | INTRAMUSCULAR | Status: DC | PRN
Start: 2016-10-24 — End: 2016-10-24

## 2016-10-24 MED ORDER — FENTANYL CITRATE (PF) 50 MCG/ML IJ SOLN (WRAP)
25.0000 ug | INTRAMUSCULAR | Status: AC | PRN
Start: 2016-10-24 — End: 2016-10-24
  Administered 2016-10-24 (×4): 25 ug via INTRAVENOUS
  Filled 2016-10-24: qty 2

## 2016-10-24 MED ORDER — PROPOFOL 200 MG/20ML IV EMUL
INTRAVENOUS | Status: AC
Start: 2016-10-24 — End: ?
  Filled 2016-10-24: qty 20

## 2016-10-24 MED ORDER — DEXAMETHASONE SODIUM PHOSPHATE 4 MG/ML IJ SOLN
INTRAMUSCULAR | Status: AC
Start: 2016-10-24 — End: ?
  Filled 2016-10-24: qty 1

## 2016-10-24 MED ORDER — ONDANSETRON HCL 4 MG/2ML IJ SOLN
INTRAMUSCULAR | Status: DC | PRN
Start: 2016-10-24 — End: 2016-10-24
  Administered 2016-10-24: 4 mg via INTRAVENOUS

## 2016-10-24 MED ORDER — BUPIVACAINE HCL (PF) 0.25 % IJ SOLN
INTRAMUSCULAR | Status: AC
Start: 2016-10-24 — End: ?
  Filled 2016-10-24: qty 30

## 2016-10-24 MED ORDER — VH EPINEPHRINE HCL 1 MG/ML IJ SOLN (WRAP)
Status: AC
Start: 2016-10-24 — End: ?
  Filled 2016-10-24: qty 4

## 2016-10-24 MED ORDER — VALLEY PROMETHAZINE 50 MG/0.4 ML TOPICAL GEL UD (RPKG)
12.5000 mg | Freq: Once | TOPICAL | Status: DC | PRN
Start: 2016-10-24 — End: 2016-10-24

## 2016-10-24 MED ORDER — LIDOCAINE HCL 2 % IJ SOLN
INTRAMUSCULAR | Status: DC | PRN
Start: 2016-10-24 — End: 2016-10-24
  Administered 2016-10-24: 40 mg

## 2016-10-24 MED ORDER — MIDAZOLAM HCL 2 MG/2ML IJ SOLN
INTRAMUSCULAR | Status: AC
Start: 2016-10-24 — End: ?
  Filled 2016-10-24: qty 2

## 2016-10-24 MED ORDER — FENTANYL CITRATE (PF) 50 MCG/ML IJ SOLN (WRAP)
INTRAMUSCULAR | Status: DC | PRN
Start: 2016-10-24 — End: 2016-10-24
  Administered 2016-10-24 (×5): 50 ug via INTRAVENOUS

## 2016-10-24 MED ORDER — HYDROMORPHONE HCL 1 MG/ML IJ SOLN
INTRAMUSCULAR | Status: DC | PRN
Start: 2016-10-24 — End: 2016-10-24
  Administered 2016-10-24 (×4): .25 mg via INTRAVENOUS

## 2016-10-24 MED ORDER — ROCURONIUM BROMIDE 50 MG/5ML IV SOLN
INTRAVENOUS | Status: AC
Start: 2016-10-24 — End: ?
  Filled 2016-10-24: qty 5

## 2016-10-24 MED ORDER — DEXAMETHASONE SODIUM PHOSPHATE 4 MG/ML IJ SOLN
INTRAMUSCULAR | Status: DC | PRN
Start: 2016-10-24 — End: 2016-10-24
  Administered 2016-10-24: 4 mg via INTRAVENOUS

## 2016-10-24 MED ORDER — HYDROMORPHONE HCL 2 MG PO TABS
ORAL_TABLET | ORAL | 0 refills | Status: AC
Start: 2016-10-24 — End: ?

## 2016-10-24 MED ORDER — BUPIVACAINE HCL (PF) 0.25 % IJ SOLN
INTRAMUSCULAR | Status: DC | PRN
Start: 2016-10-24 — End: 2016-10-24
  Administered 2016-10-24: 30 mL

## 2016-10-24 MED ORDER — ONDANSETRON HCL 4 MG PO TABS
4.0000 mg | ORAL_TABLET | Freq: Three times a day (TID) | ORAL | 1 refills | Status: AC | PRN
Start: 2016-10-24 — End: ?

## 2016-10-24 MED ORDER — HYDROMORPHONE HCL 1 MG/ML IJ SOLN
INTRAMUSCULAR | Status: AC
Start: 2016-10-24 — End: ?
  Filled 2016-10-24: qty 1

## 2016-10-24 MED ORDER — LIDOCAINE HCL (PF) 1 % IJ SOLN
INTRAMUSCULAR | Status: AC
Start: 2016-10-24 — End: ?
  Filled 2016-10-24: qty 60

## 2016-10-24 MED ORDER — ONDANSETRON HCL 4 MG/2ML IJ SOLN
INTRAMUSCULAR | Status: AC
Start: 2016-10-24 — End: ?
  Filled 2016-10-24: qty 2

## 2016-10-24 MED ORDER — ONDANSETRON HCL 4 MG/2ML IJ SOLN
4.0000 mg | Freq: Once | INTRAMUSCULAR | Status: DC | PRN
Start: 2016-10-24 — End: 2016-10-24

## 2016-10-24 MED ORDER — EPHEDRINE SULFATE 50 MG/ML IJ SOLN
INTRAMUSCULAR | Status: DC | PRN
Start: 2016-10-24 — End: 2016-10-24
  Administered 2016-10-24: 10 mg via INTRAVENOUS

## 2016-10-24 MED ORDER — CEFAZOLIN SODIUM 1 G IJ SOLR
2.0000 g | Freq: Once | INTRAMUSCULAR | Status: AC
Start: 2016-10-24 — End: 2016-10-24
  Administered 2016-10-24: 2 g via INTRAVENOUS
  Filled 2016-10-24: qty 20
  Filled 2016-10-24: qty 2000

## 2016-10-24 MED ORDER — SODIUM CHLORIDE 0.9 % IR SOLN
Status: DC | PRN
Start: 2016-10-24 — End: 2016-10-24
  Administered 2016-10-24: 42000 mL

## 2016-10-24 MED ORDER — GLYCOPYRROLATE 0.4 MG/2ML IJ SOLN
INTRAMUSCULAR | Status: AC
Start: 2016-10-24 — End: ?
  Filled 2016-10-24: qty 2

## 2016-10-24 MED ORDER — NEOSTIGMINE METHYLSULFATE 0.5 MG/ML IJ SOLN
INTRAMUSCULAR | Status: AC
Start: 2016-10-24 — End: ?
  Filled 2016-10-24: qty 10

## 2016-10-24 MED ORDER — SODIUM CHLORIDE BACTERIOSTATIC 0.9 % IJ SOLN
INTRAMUSCULAR | Status: AC
Start: 2016-10-24 — End: ?
  Filled 2016-10-24: qty 10

## 2016-10-24 SURGICAL SUPPLY — 45 items
ADHESIVE DERMABOND HIVIC PEN (Supply) ×6 IMPLANT
ANCHOR BIOCOMP 4.75MM SWVLCK ×2 IMPLANT
ANCHOR Q-FIX ALL SUTURE 2.8MM ×1 IMPLANT
BLADE SCALPEL #11 (Supply) ×6 IMPLANT
BONE CUTTER 4.0MM (Supply) ×6 IMPLANT
BURR OVAL 8 FLUTE 4.0MMX13CM (Supply) ×6 IMPLANT
CANNULA CRYSTAL PART THRD 5.75 (Supply) ×1 IMPLANT
CANNULA TWIST IN W/NO SQT CAP (Supply) ×1 IMPLANT
CHLORAPREP W/ORANGE TINT 26ML (Supply) ×12 IMPLANT
COVER MAYO STAND (Supply) ×6 IMPLANT
DRAPE ANTIMICROBIAL IOBAN (Supply) ×3 IMPLANT
DRAPE LAT SHLDR W/POUCH (Supply) ×5 IMPLANT
DRAPE TOWEL LARGE 17.5 X 23.5 (Supply) ×6 IMPLANT
DRAPE U (Supply) ×6 IMPLANT
DRAPE U (MEDLINE) (Supply) ×12 IMPLANT
DRSG ADAPTIC 3 X 8 (Dressings) ×6 IMPLANT
FIBERTAPE 7 (Supply) ×1 IMPLANT
FILTER NEPTUNE 4 PORT (Supply) ×6 IMPLANT
GOWN NON-REINFORCED XLG STERIL (Supply) ×6 IMPLANT
KIT DISP Q-FIX 2.8MM (Supply) ×1 IMPLANT
KIT SHOULDER STABILIZATION (Supply) ×6 IMPLANT
MAT SUCTION LOW PROFILE 46X32 (Supply) ×9 IMPLANT
NEEDLE SCORPION MULTIFIRE (Supply) ×1 IMPLANT
NEEDLE SCORPION SUREFIRE (Supply) IMPLANT
PACK SURGI-SHOULDER-LF (Supply) ×5 IMPLANT
PAD ARMBOARD 20 X 8 (Supply) ×6 IMPLANT
PAD FLOOR HEAVY  PIG MAT (Supply) ×24 IMPLANT
POUCH INSTRUMENT (Supply) ×6 IMPLANT
PROBE APOLLORF 90 DEGREE MULT (Supply) ×7 IMPLANT
SHEET DRAPE MEDIUM (Supply) ×6 IMPLANT
SLEEVE LATERAL TRACTION ARM (Supply) ×6 IMPLANT
SLING ARM LARGE (Supply) IMPLANT
SLING ARM MEDIUM EA (Supply) IMPLANT
SOL SALINE IRRIG 3000ML (Supply) ×24 IMPLANT
STERISTRIP 1/2 IN X 4 IN (Supply) ×6 IMPLANT
STOCKINETTE ROLL 4IN (Dressings) ×6 IMPLANT
SUT ETHILON 3-0 1669H (Supply) ×6 IMPLANT
SUT MONOCRYL 4-0 Y426H (Supply) ×6 IMPLANT
SUT PDS PLUS VIO MONO SA CT-1 (Supply) IMPLANT
SUT VICRYL 3-0 J416H (Supply) ×3 IMPLANT
TOWEL (FAN-FOLD) 6/PK (Supply) ×12 IMPLANT
TUBING REDEUCE PATIENT ARTHREX (Supply) ×6 IMPLANT
TUBING REDEUCE PUMP ARTHREX (Supply) ×6 IMPLANT
TUBING SUCTION 10FT STERILE (Supply) ×12 IMPLANT
TUBING Y ADAPTOR ARTHREX (Supply) ×6 IMPLANT

## 2016-10-24 NOTE — Transfer of Care (Signed)
Anesthesia Transfer of Care Note    Patient: [name] [name]    Last vitals:   Vitals:    10/24/16 1103   BP: 107/73   Pulse: 77   Resp: 14   Temp: (!) 35.9 C (96.6 F)   SpO2: 99%       Oxygen: Mask     Mental Status:awake    Airway: Oral Airway    Cardiovascular Status:  stable        HOB elevated, spont resp

## 2016-10-24 NOTE — OR PreOp (Signed)
Ancef 2 gm at bedside for on call to OR.  0715:  Went to lobby to get wife-not in waiting room-went out into hallway also, unable to find.  Pt. Made aware.

## 2016-10-24 NOTE — Brief Op Note (Signed)
BRIEF OP NOTE    Date Time: 10/24/16 10:56 AM    Patient Name:   [name] [name]    Date of Operation:   10/24/2016    Providers Performing:   Surgeon(s):  Donald Pore, MD    Assistant (s):   Circulator: Alben Deeds, RN; Phylliss Blakes, RN  Scrub Person: Kathyrn Drown, RN  First Assistant: Silva Bandy    Operative Procedure:   Procedure(s):  ARTHROSCOPY, SHOULDER, ROTATOR CUFF REPAIR  ARTHROSCOPY, SHOULDER, W/ DEBRIDEMENT  ARTHROSCOPY, SHOULDER, MUMFORD/DCE  ARTHROSCOPY, SHOULDER, SUBSCAPULARIS REPAIR   Revision subacromial decompression    Preoperative Diagnosis:   Pre-Op Diagnosis Codes:     * Tear of right rotator cuff, unspecified tear extent [M75.101]    Postoperative Diagnosis:   Post-Op Diagnosis Codes:     * Tear of right rotator cuff, unspecified tear extent [M75.101]    Anesthesia:   General    Estimated Blood Loss:    * No values recorded between 10/24/2016  7:36 AM and 10/24/2016 10:56 AM * 20 cc    Implants:     Implant Name Type Inv. Item Serial No. Manufacturer Lot No. LRB No. Used Action   ANCHOR Q-FIX ALL SUTURE 2.8MM - [id number]  ANCHOR Q-FIX ALL SUTURE 2.8MM  [employer] [id number] Right 1 Implanted   ANCHOR BIOCOMP 3.5MM SWVLCK - [id number]   ANCHOR BIOCOMP 3.5MM SWVLCK   [id number] Right 2 Implanted       Drains:   Drains: no    Specimens:    none    OR Specimens:   * No specimens in log *    Findings:   See op report     Complications:   none      Signed by: Donald Pore, MD                                                                           [facility]

## 2016-10-24 NOTE — PACU (Signed)
Assisted patient to bathroom with 3 assists to void per patient's request.

## 2016-10-24 NOTE — Anesthesia Postprocedure Evaluation (Signed)
Post Operative Evaluation    Patient: [name] [name]    Procedures performed: Procedure(s):  ARTHROSCOPY, SHOULDER, ROTATOR CUFF REPAIR  ARTHROSCOPY, SHOULDER, W/ DEBRIDEMENT  ARTHROSCOPY, SHOULDER, MUMFORD/DCE  ARTHROSCOPY, SHOULDER, SUBSCAPULARIS REPAIR    Patient location: PACU    Last vitals:   Vitals:    10/24/16 1245   BP: 126/89   Pulse:    Resp:    Temp:    SpO2: 97%       Post pain: Pain control adequate, continue current therapy     Mental Status:  Awake, alert    Respiratory Function: room air    Cardiovascular: stable    Nausea/Vomiting: therapy adequate; appropriate medications received    Hydration Status: adequate    Post assessment: no apparent anesthetic complications, no reportable events and no evidence of recall

## 2016-10-24 NOTE — H&P (Signed)
Orthopaedic History & Physical    Date Time: 10/24/16 6:51 AM  Patient Name: [name] [name],[name] [name] ;   Donald Pore, MD Attending Physician        Assessment & Plan  Orthopaedic assessment:  56 y.o. year old male with right RCT    Plan:  to OR for shoulder scope, RCR, poss BT, poss DCE, poss BT       HPI    [name] [name] is a 56 y.o. year old male.  He has had pain in his shoulder for some time. MRI showed rotator cuff tear. He has failed conservative mgmt and has elected for surgery. All risks and benefits discussed    Past Medical and Surgical History      Past Medical History:   Diagnosis Date   . Arthritis    . Cerebral palsy    . Chronic back pain    . Chronic shoulder pain, right    . Difficulty walking    . Low back pain    . Muscle pain     torn bicep    . Premature birth    . Tobacco dependence         Past Surgical History:   Procedure Laterality Date   . ARTHROSCOPY SHOULDER & BICEPS TENOTOMY Right 06/04/2015    Procedure: ARTHROSCOPY SHOULDER & BICEPS TENOTOMY;  Surgeon: Hendricks Milo, MD;  Location: [facility];  Service: Orthopedics;  Laterality: Right;   . COLONOSCOPY N/A 08/15/2016    Procedure: COLONOSCOPY;  Surgeon: Chauncey Fischer, MD;  Location: [facility];  Service: Gastroenterology;  Laterality: N/A;   . FRACTURE SURGERY      hip surgery    . FRACTURE SURGERY     . HERNIA REPAIR     . LAMINECTOMY, POSTERIOR LUMBAR, DECOMPRESSION, FUSION LEVEL 1 N/A 01/03/2015    Procedure: LAMINECTOMY, POSTERIOR LUMBAR, DECOMPRESSION, FUSION LEVEL 1;  Surgeon: Haskel Khan, MD;  Location: [facility];  Service: Neurosurgery;  Laterality: N/A;  L5-S1 PLIF   . LEG SURGERY      left leg surgery as child from CP   . TRACHEOSTOMY      premie       Past Social History     Social History     Social History   . Marital status: Married     Spouse name: N/A   . Number of children: N/A   . Years of education: N/A     Social History Main Topics   . Smoking status: Current Every Day Smoker      Packs/day: 1.00     Types: Cigarettes   . Smokeless tobacco: Never Used   . Alcohol use No   . Drug use: No   . Sexual activity: Not on file     Other Topics Concern   . Not on file     Social History Narrative    ** Merged History Encounter **         ** Merged History Encounter **            Family History   Family hx of CVA, CA, HTN, DM, CAD reviewed   Family History   Problem Relation Age of Onset   . No known problems Mother    . Esophageal cancer Father    . Diabetes Brother        Review of Systems:   All other systems were reviewed and are negative except: see HPI  Home Medications     Prior to Admission medications    Medication Sig Start Date End Date Taking? Authorizing Provider   aspirin 81 MG chewable tablet Chew 1 tablet (81 mg total) by mouth daily. 01/22/16   Rolin Barry, NP   gabapentin (NEURONTIN) 300 MG capsule Take 300 mg by mouth daily.    [provider]   meloxicam (MOBIC) 15 MG tablet Take 15 mg by mouth daily.    [provider]       Allergies     Allergies   Allergen Reactions   . Codeine Nausea And Vomiting   . Flexall [Menthol]    . Flexeril [Cyclobenzaprine] Other (See Comments)     Pt states this makes him "goofy"   . Naprosyn [Naproxen] Other (See Comments)     Causes stomach to hurt   . Toradol [Ketorolac Tromethamine] Nausea And Vomiting       Radiology Studies:     Radiology Results (24 Hour)     ** No results found for the last 24 hours. **                                    Physical Exam:     Patient is a 56 y.o. year old male who is alert, well appearing, and in no distress and oriented to person, place, and time.    Ht 1.626 m (5\' 4" )   Wt 53.1 kg (117 lb)   BMI 20.08 kg/m   53.1 kg (117 lb)   1.626 m (5\' 4" )    Gait: normal  HEENT: Normocephalic and Atraumatic  Abd: Soft non tender - non distended with active bowel sounds   Heart: regular rate and rhythm without mumurs   Lungs: Clear to auscultation  Neuro: grossly intact     Right Upper  Extremity:   No open wounds, some weakness, pos impingement, pain over AC, dNVI    Right Lower Extremity:   WNl       Left Upper Extremity:   wnL    Left Lower Extremity:   wnl       Pelvis:   WNL

## 2016-10-24 NOTE — Anesthesia Preprocedure Evaluation (Signed)
Anesthesia Evaluation    AIRWAY    Mallampati: I    TM distance: >3 FB  Neck ROM: full  Mouth Opening:full   CARDIOVASCULAR    cardiovascular exam normal, regular and normal       DENTAL    no notable dental hx     PULMONARY    pulmonary exam normal and clear to auscultation     OTHER FINDINGS              Relevant Problems   No active problems are marked relevant to this note.       PSS Anesthesia Comments: Refuses PNB        Anesthesia Plan    ASA 2     general                     intravenous induction           Post op pain management: per surgeon    informed consent obtained    Plan discussed with CRNA.      pertinent labs reviewed             Signed by: Adele Dan 10/24/16 6:52 AM

## 2016-10-24 NOTE — Op Note (Signed)
Date of service:10/24/2016     PREOPERATIVE DIAGNOSIS: Right rotator cuff tear, impingement, subscapularis tear, AC joint OA.    POSTOPERATIVE DIAGNOSIS: same.    PROCEDURE:  Right shoulder arthroscopy, rotator cuff repair, subscapularis repair, revision subacromial decompression, distal clavicle excision, extensive debridement.    SURGEON: Rhea Pink, MD.    ASSIST: [name] Aas CST.    ANESTHESIA: General ET tube anesthesia     DRAINS: None.    IMPLANTS: Smith and Nephew Q-fix 2.8 mm double loaded x 1, Arthrex 4.75 swivel lock x 2    COMPLICATIONS: None.    EBL: 20 ccs    SPECIMENS: none    DISPOSITION: Stable to PACU.    The patient was correctly identified in the preoperative area and the appropriate extremity was signed by the attending surgeon.  All risks and benefits were discussed.  All questions were answered. A signed consent was placed in the chart.  The patient was taken to the operating room.  The patient underwent general anesthesia.  A time-out was completed with all members in the room.  IV antibiotics were administered. All boney prominences were padded. Venodynes were placed on the nonoperative lower extremity, the patient was placed into lateral decubitus position and the right upper extremity was prepped and draped in usual sterile fashion. 12 pounds of balanced traction was used to hold the operative arm.    We started by making a posterior portal. The arthroscope was placed into the glenohumeral joint. Diagnostic arthroscopy was performed :    Cartilage : grade 0-1 changes throughout glenoid, grade 0 changes of humeral head  Labrum : some degenerative fraying of anterior and posterior labrum  Biceps: previous rupture  Subscapularis : upper boarder tear of proximal 1 cm with retraction  Rotator cuff: full thickness anterior border of supraspinatous tearing  Capsule: intact, no loose bodies    Following the diagnostic arthroscopy, an anterior portal was made using a spinal needle. An  arthrex cannula was then inserted. The shaver was used to debride labrum, the biceps stump and the cuff tear as well as some frayed cartilage. At this point we used the shaver to free up some of the subscapularis which was scarred down. We used a grasper to mobilize the tendon. Next we drilled and inserted a smith and nephew q fix into the area where it had torn off. This had previously been cleaned up with the shaver down to bleeding bone. We then passed the double loaded sutures in a horizontal mattress fashion and theses were tied down giving [place] a good repair. It was stable to probing.     After this was complete, the scope was then inserted into the subacromial space after switching the traction. There was extensive bursitis. A spinal needle was used to make a lateral portal. The shaver and electrocautery were then used from the lateral portal to perform a thorough bursectomy. The cuff tear was then visualized from above. There had been a previous subacromial decompression but there was some scarring visualized. This was cleaned up with the electrocautery and the burr.  Adequate space for the cuff was then visualized. The rotator cuff was then further debrided and the footprint was cleaned up with a burr and shaver so that there was bleeding bone. The tear was in the far anterior aspect of the insertion. A arthrex swivel lock anchor that was loaded with tape was then used and placed next to the articular surface. Using an arthrex fastpass the two limbs  were passed in a [employer] configuration in the cuff tear. We also passed the two limbs of the fibertape. The sutures  were then tied down using arthroscopic knots. Next we cleared off an area laterally and used our sutures and passed them through another swivel lock to complete our double row repair. The sutures had good tension on them after and final pictures were taken.    Next we performed our distal clavicle excision as he as tender over this area preop  and had good relief with an injection there for some time. We used our anterior portal to perform the excision of 1 cm. All bleeders were coagulated with the arthrocare wand.     The joint was then lavaged, as much fluid was removed and the wounds were closed with 4-0 monocryl, sterstrips, adaptech,  4x4s, and ABD and tape. The patient was placed into a shoulder immobilizer with an abduction pillow. They were then awoken and taken to the PACU in stable condition.     My PA was present and was integral for positioning, holding the arthroscope, transitioning, suture management, and suturing. I was scrubbed for the entirety of the case.    Harley Hallmark MD

## 2016-10-24 NOTE — OR PreOp (Signed)
06:50  Offered block by Dr. Edger House.

## 2016-10-24 NOTE — Discharge Instr - AVS First Page (Addendum)
[facility]  DISCHARGE INSTRUCTIONS FOR SHOULDER SURGERY      POST ANESTHESIA:  . Although you will be awake and alert in the Recovery Room, small amounts of sedation will remain in your body for at least 24 hours, and you may feel tired and sleepy for the next 24 hours. You are advised to go directly home, take it easy and rest as much as possible.  . It is advisable to have someone with you at home for the remainder of the day.  . Children should not be left unattended for the next 24 hours.      FOR THE NEXT 24 HOURS:  . DO NOT operate a motor vehicle or any mechanical or electrical equipment.  . DO NOT make important decisions or sign legal documents.  . DO NOT consume alcohol, tranquilizers, sleeping medications, or any non-prescribed medication.    DIET:  . Begin with liquids and progress to soft food. Progress to your normal diet if not nauseated. Nausea and vomiting may occur in the first 24 hrs.    ACTIVITY:  SLING: Wear at all times, except when doing range of motion exercises  You may begin the following exercises as soon as you can after surgery or when your nerve block (if applicable) wears off.    Dispense the Following shoulder exercises  Pendulum/Codman    MEDICATIONS:  . When taking pain medications, you may experience dizziness or drowsiness.  Do not drive when you are taking these medications.  Remember, pain medications may cause constipation.    You had an oxycodone tablet at 1:15pm. Next dose of pain medicine at 5:15pm. Take pain medicine as directed.     You had Tylenol while you were at the [facility]urg[facility]Center. Tylenol is in many different medications, including prescription pain medications, so please be careful not to exceed the recommended amount.  Since you had Tylenol at _7:35am_ you should not take it again for four hours, which would be _11:35am_.      DRESSINGS AND WOUND CARE:   ? Leave dressing on and keep it clean and dry: remove in  3 Days   ? Leave any steri-strips  (plastic strips), sutures/staples, or any bonding "glue" that may have been used to protect your incision in place.  ? When you are allowed to shower, wash gently over incision, pat dry and leave uncovered.   Please do not apply ointments, unless otherwise instructed by your doctor.  Marland Kitchen Apply ice to your shoulder for 20 minutes every hour (while you are awake) for the next 24 to 48 hours.   . You may want to sleep in a recliner or semi-sitting position for comfort.    Preventing Infection  There are many things YOU can do to prevent your surgical site from becoming infected.    ? Wash your hands with soap and water before and after changing your bandages or touching your incision.  ? Keep your bandages dry and follow your surgeon's instructions on dressing changes and removal.  ? Wear loose fitting clothing to protect your incisions from rubbing and irritation.  ? Shower daily as soon as you are able, cleaning around the site with soap and water.  Pat the area dry with a clean towel or use a hair dryer on no or low heat.  Be sure you are cleaning and caring for your incision at least once a day.  ? No bath tubs, swimming pools, or hot tubs for two weeks unless  otherwise directed by your surgeon.  ? Do not apply ointments, lotions, or powder to the area unless specifically instructed to by your surgeon.  ? No not let dirty objects or animals touch your incision.  ? Leave any clear adhesive glue (dermabond ) or little pieces of tape (steri-strips) in place.  Other actions that can help prevent infection:  ? Eat a well-balanced nutritious diet  ? Stop or decrease smoking during the healing period (or better yet, permanently).  Smoking increases healing time and increases the risk of infection.  ? If you are a diabetic, keep your blood sugars well-regulated.    . IF  YOU HAVE ANY OF THE FOLLOWING, PLEASE CONTACT YOUR PHYSICIAN:         1. Pain not controlled by the medication prescribed for you.         2. Unexplained  fever.         3. Bleeding greater than spots from affected area.         4. Increased redness, warmth, hardness around the operative area.         5. Change in sensation, color or movement of affected extremity.         6. Other unexplained symptoms, problems or concerns.         7. If you experience redness or swelling at the IV insertion site that continues or worsens, please have it evaluated by your doctor.      Deep Vein Thrombosis (DVT) Precautions    The termvenous thromboembolism (VTE)is used to describe 2 conditions, deep vein thrombosis (DVT)andpulmonary embolism(PE).These conditions and their prevention and treatment are very closely related.  DVT occurs when a blood clot(thrombus)forms in a deep vein. This happens most often in the leg. It can also happen in the arms or other parts of the body. A part of the clot called anemboluscan break off and travel to the lungs. This is called pulmonary embolism. PE is a medical emergency. It can cut off theflow of blood and cause death.  The symptoms of a blood clot areredness, swelling, and pain.These symptoms occur at the site of the DVT, such as in the leg.  Factors that increase risk of DVT includebeingoverweight, smoking,andusing male hormones (estrogen replacement therapy).Longperiodsof timewithout movement, such as when travelinglong distancesby car or plane, or when being confined to bed due to surgery, illness, or injury,also increases the risk of blood clots.  Home care   Follow your health care provider's instructions about activity and rest.   Support or compression stockings may have been prescribed. They help improve blood flow in the legs.Be sure to wear them as directed.   Whensitting or lying down, move your ankles, toes and knees often.This also improves blood flow in the legs.   You may have been prescribed an anticoagulants or "blood thinners." They may be given as pills (oral) or shots (injections).Make sure  you follow all instructions on how to take them.  Follow-up care  Follow up with yourhealth care provider as advised.  When to seek medical advice  Call your health care provider if you have swelling, pain, or rednessin the leg, arm, or other area. These symptoms may mean a blood clot.    Call 911  Call 911or get emergency help ifyou have the following symptoms that may mean a blood clot in your lungs:   Trouble breathing   Chest pain   Coughing (may cough up blood)   Fast heartbeat   Sweating  Fainting   Heavy or uncontrolled bleeding        We would like to wish you a quick and comfortable recovery from your procedure. For any questions or concerns about the care you received, please contact us at the appropriate number below Monday-Friday from 8:30-5:00pm. Please direct any medical questions to your surgeon by calling their office or the switchboard at [phone].                [facility] at [facility] [phone]                [facility]- [phone]       IF YOU NEED IMMEDIATE ATTENTION AND YOUR SURGEON IS NOT AVAILABLE, PLEASE GO TO YOUR NEAREST EMERGENCY DEPARTMENT.

## 2016-10-24 NOTE — PACU (Signed)
Pt still having moderate pain.vss. Order to give additional pain pill. Pt assisted with dressing. Transferred to phase 2 via recliner

## 2016-10-28 ENCOUNTER — Encounter (AMBULATORY_SURGERY_CENTER): Payer: Self-pay | Admitting: Orthopaedic Surgery

## 2016-12-12 ENCOUNTER — Encounter (HOSPITAL_COMMUNITY): Payer: Self-pay | Admitting: Emergency Medicine

## 2016-12-12 ENCOUNTER — Emergency Department (HOSPITAL_COMMUNITY)
Admission: EM | Admit: 2016-12-12 | Discharge: 2016-12-12 | Disposition: A | Discharge status: Home / Self Care | Payer: Medicare Other | Attending: Emergency Medicine | Admitting: Emergency Medicine

## 2016-12-12 DIAGNOSIS — F17200 Nicotine dependence, unspecified, uncomplicated: Secondary | ICD-10-CM | POA: Diagnosis not present

## 2016-12-12 DIAGNOSIS — X509XXA Other and unspecified overexertion or strenuous movements or postures, initial encounter: Secondary | ICD-10-CM | POA: Insufficient documentation

## 2016-12-12 DIAGNOSIS — S4991XA Unspecified injury of right shoulder and upper arm, initial encounter: Secondary | ICD-10-CM | POA: Diagnosis present

## 2016-12-12 DIAGNOSIS — Y929 Unspecified place or not applicable: Secondary | ICD-10-CM | POA: Insufficient documentation

## 2016-12-12 DIAGNOSIS — G8929 Other chronic pain: Secondary | ICD-10-CM | POA: Insufficient documentation

## 2016-12-12 DIAGNOSIS — S46911A Strain of unspecified muscle, fascia and tendon at shoulder and upper arm level, right arm, initial encounter: Secondary | ICD-10-CM

## 2016-12-12 DIAGNOSIS — Y939 Activity, unspecified: Secondary | ICD-10-CM | POA: Insufficient documentation

## 2016-12-12 DIAGNOSIS — M545 Low back pain, unspecified: Secondary | ICD-10-CM

## 2016-12-12 DIAGNOSIS — Y999 Unspecified external cause status: Secondary | ICD-10-CM | POA: Diagnosis not present

## 2016-12-12 MED ORDER — TRAMADOL HCL 50 MG PO TABS: 50.0000 mg | ORAL_TABLET | Freq: Four times a day (QID) | ORAL | 0 refills | Status: DC | PRN

## 2016-12-12 MED ORDER — PREDNISONE 10 MG PO TABS: ORAL_TABLET | ORAL | 0 refills | Status: DC

## 2016-12-12 NOTE — ED Triage Notes (Addendum)
Patient c/o right shoulder pain and left lower back pain x "a few days" after a recent move. Reports pain worsens with movement. Ambulatory to triage.

## 2016-12-12 NOTE — ED Provider Notes (Signed)
[facility]-EMERGENCY DEPT Provider Note   CSN: [id number] Arrival date & time: 12/12/16  1542  By signing my name below, I, Teofilo Pod, attest that this documentation has been prepared under the direction and in the presence of Burgess Amor, PA-C. Electronically Signed: Teofilo Pod, ED Scribe. 12/12/2016. 5:14 PM.    History   Chief Complaint Chief Complaint  Patient presents with  . Back Pain  . Shoulder Pain    The history is provided by the patient. No language interpreter was used.   HPI Comments:  Philip Bates is a 56 y.o. male who presents to the Emergency Department complaining of worsening right shoulder and lower back pain x 2-3 days. Pt reports that he recently moved to [facility] and has had significant strain to his shoulders and back with activities associated with moving. Pt states that the back pain is non-radiating, and is primarily on the left side of his lower back. Pt complains of associated right arm weakness which is chronic since his rotator cuff surgery performed one [place] ago in [place]. He also reports a distant history of  back surgery.  Pt is right hand dominant. He notes a known allergy to flexeril. No alleviating factors noted. There has been no weakness or numbness in the lower extremities and no urinary or bowel retention or incontinence.  Patient does not have a history of cancer or IVDU.  The patient has tried rest and motrin without significant relief of symptoms.  History reviewed. No pertinent past medical history.  There are no active problems to display for this patient.   Past Surgical History:  Procedure Laterality Date  . BACK SURGERY    . HERNIA REPAIR    . LEG SURGERY    . SHOULDER SURGERY         Home Medications    Prior to Admission medications   Medication Sig Start Date End Date Taking? Authorizing Provider  predniSONE (DELTASONE) 10 MG tablet Take 6 tablets day one, 5 tablets day two, 4 tablets day three, 3 tablets day  four, 2 tablets day five, then 1 tablet day six 12/12/16   Burgess Amor, PA-C  traMADol (ULTRAM) 50 MG tablet Take 1 tablet (50 mg total) by mouth every 6 (six) hours as needed. 12/12/16   Burgess Amor, PA-C    Family History History reviewed. No pertinent family history.  Social History Social History  Substance Use Topics  . Smoking status: Current Every Day Smoker  . Smokeless tobacco: Never Used  . Alcohol use Not on file     Allergies   Flexeril [cyclobenzaprine]   Review of Systems Review of Systems  Constitutional: Negative for fever.  Genitourinary: Negative for dysuria.  Musculoskeletal: Positive for back pain and myalgias.  Neurological: Positive for weakness. Negative for numbness.     Physical Exam Updated Vital Signs BP 140/89 (BP Location: Right Arm)   Pulse 61   Temp 98.6 F (37 C) (Oral)   Resp 14   SpO2 100%   Physical Exam  Constitutional: He appears well-developed and well-nourished. No distress.  HENT:  Head: Normocephalic and atraumatic.  Eyes: Conjunctivae are normal.  Neck: Normal range of motion. Neck supple.  Cardiovascular: Normal rate and intact distal pulses.   Pedal pulses normal.  Pulmonary/Chest: Effort normal.  Abdominal: Soft. Bowel sounds are normal. He exhibits no distension and no mass.  Musculoskeletal: Normal range of motion. He exhibits no edema.       Lumbar back: He exhibits tenderness.  He exhibits no swelling, no edema and no spasm.  TTP along left paralumbar region without midline pain or deformity. Right shoulder: TTP, anterior right shoulder joint, well healed arthroscopic incisions, no palpable deformity. 4/5 right and 5/5 left grip strength. Bicep DTRs equal bilaterally. Full resisted strength in elbows and wrists. Distal sensation equal bilaterally.  Neurological: He is alert. He has normal strength. He displays no atrophy and no tremor. No sensory deficit. Gait normal.  Reflex Scores:      Patellar reflexes are 2+ on the  right side and 2+ on the left side. No strength deficit noted in hip and knee flexor and extensor muscle groups.  Ankle flexion and extension intact.  Skin: Skin is warm and dry.  Psychiatric: He has a normal mood and affect.  Nursing note and vitals reviewed.    ED Treatments / Results  DIAGNOSTIC STUDIES:  Oxygen Saturation is 98% on RA, normal by my interpretation.    COORDINATION OF CARE:  5:14 PM Will refer to orthopedics. Discussed treatment plan with pt at bedside and pt agreed to plan. Pt placed on prednisone taper, tramadol. Ice tx discussed, sling prn for right extremity (pt has).   Labs (all labs ordered are listed, but only abnormal results are displayed) Labs Reviewed - No data to display  EKG  EKG Interpretation None       Radiology No results found.  Procedures Procedures (including critical care time)  Medications Ordered in ED Medications - No data to display   Initial Impression / Assessment and Plan / ED Course  I have reviewed the triage vital signs and the nursing notes.  Pertinent labs & imaging results that were available during my care of the patient were reviewed by me and considered in my medical decision making (see chart for details).     No neuro deficit on exam or by history to suggest emergent or surgical presentation.  Also discussed worsened sx that should prompt immediate re-evaluation including distal weakness, bowel/bladder retention/incontinence.        Final Clinical Impressions(s) / ED Diagnoses   Final diagnoses:  Chronic left-sided low back pain without sciatica  Shoulder strain, right, initial encounter    New Prescriptions Discharge Medication List as of 12/12/2016  5:33 PM    START taking these medications   Details  predniSONE (DELTASONE) 10 MG tablet Take 6 tablets day one, 5 tablets day two, 4 tablets day three, 3 tablets day four, 2 tablets day five, then 1 tablet day six, Print    traMADol (ULTRAM) 50 MG  tablet Take 1 tablet (50 mg total) by mouth every 6 (six) hours as needed., Starting Fri 12/12/2016, Print      I personally performed the services described in this documentation, which was scribed in my presence. The recorded information has been reviewed and is accurate.      Burgess AmorJulie Carol Loftin, PA-C 12/13/16 2156    Marily MemosJason Mesner, MD 12/14/16 1256

## 2016-12-12 NOTE — Discharge Instructions (Signed)
Take your medicines as directed.  Do not drive within 4 hours of taking tramadol as this will make you drowsy.  Avoid lifting,  Bending,  Twisting or any other activity that worsens your pain over the next week.  Apply an  icepack  to your lower back for 10-15 minutes every 2 hours for the next 2 days.  You should get rechecked if your symptoms are not better over the next 5 days,  Or you develop increased pain,  Weakness in your leg(s) or loss of bladder or bowel function - these are symptoms of a worse injury.

## 2016-12-23 ENCOUNTER — Ambulatory Visit (INDEPENDENT_AMBULATORY_CARE_PROVIDER_SITE_OTHER): Payer: Medicare Other | Admitting: Orthopedic Surgery

## 2016-12-23 ENCOUNTER — Encounter (INDEPENDENT_AMBULATORY_CARE_PROVIDER_SITE_OTHER): Payer: Self-pay | Admitting: Orthopedic Surgery

## 2016-12-23 ENCOUNTER — Ambulatory Visit (INDEPENDENT_AMBULATORY_CARE_PROVIDER_SITE_OTHER): Payer: Medicare Other

## 2016-12-23 DIAGNOSIS — G8929 Other chronic pain: Secondary | ICD-10-CM | POA: Diagnosis not present

## 2016-12-23 DIAGNOSIS — M25511 Pain in right shoulder: Secondary | ICD-10-CM | POA: Diagnosis not present

## 2016-12-23 DIAGNOSIS — M545 Low back pain, unspecified: Secondary | ICD-10-CM

## 2016-12-23 NOTE — Progress Notes (Signed)
Office Visit Note   Patient: Philip Bates           Date of Birth: 1961-04-16           MRN: [id number] Visit Date: 12/23/2016              Requested by: No referring provider defined for this encounter. PCP: No PCP Per Patient  Chief Complaint  Patient presents with  . Lower Back - Pain  . Right Shoulder - Pain    WUJ:[id number] is a complex past medical history he is status post right shoulder arthroscopy with decompression distal clavicle resection and rotator cuff repair and biceps tendon surgery in [place] in January of this year. Patient moved to this area he is also status post lumbar spine fusion with cages and pedicle screws which was performed in 2016 also in [place]. Patient states he's been wearing a brace for his arm as well as for his back he is been on Neurontin and mobile,. He is discontinuing the Moberg due to elevated blood pressure. Patient states he was concerned for the possible need for a shoulder replacement. HPI  Assessment & Plan: Visit Diagnoses:  1. Chronic right shoulder pain   2. Chronic left-sided low back pain without sciatica     Plan: Patient is given per prescription for physical therapy for internal and external rotation strengthening and scapular stabilization for the right shoulder. I do not feel that he would be a candidate for a total shoulder his glenohumeral joint is congruent. Recommended the patient follow-up with neurosurgery for evaluation for his complex lumbar spine internal fixation. Patient states his wife is also going to the neurosurgical group.  Follow-Up Instructions: No Follow-up on file.   Ortho Exam On examination patient is alert oriented no neuropathy well-dressed on left rectum respiratory effort. Examination he has significant quad atrophy and atonia secondary to surgery for his cerebral palsy left thigh. Patient has no focal motor weakness either lower extremity negative straight leg raise bilaterally. Patient does  have decreased range of motion the right shoulder he has some pain into the biceps region with internal and external rotation. His surgical portals are clean and dry. ROS: Review of systems all were reviewed all negative except for history of arthritis osteoporosis and cerebral palsy. Imaging: Xr Lumbar Spine 2-3 Views  Result Date: 12/23/2016 Two-view radiographs lumbar spine shows a scoliosis E status post L5-S1 fusion decompression with placement of cages. No signs of hardware failure.  Xr Shoulder Right  Result Date: 12/23/2016 Three-view radiographs of the right shoulder shows a congruent glenohumeral. Patient is status post subacromial the decompression with distal clavicle resection. His glenohumeral joint is congruent.   Labs: No results found for: HGBA1C, ESRSEDRATE, CRP, LABURIC, REPTSTATUS, GRAMSTAIN, CULT, LABORGA  Orders:  Orders Placed This Encounter  Procedures  . XR Shoulder Right  . XR Lumbar Spine 2-3 Views   No orders of the defined types were placed in this encounter.    Procedures: No procedures performed  Clinical Data: No additional findings.  Subjective: Review of Systems  Objective: Vital Signs: There were no vitals taken for this visit.  Specialty Comments:  No specialty comments available.  PMFS History: There are no active problems to display for this patient.  History reviewed. No pertinent past medical history.  History reviewed. No pertinent family history.  Past Surgical History:  Procedure Laterality Date  . BACK SURGERY    . HERNIA REPAIR    . LEG SURGERY    .  SHOULDER SURGERY     Social History   Occupational History  . Not on file.   Social History Main Topics  . Smoking status: Current Every Day Smoker  . Smokeless tobacco: Never Used  . Alcohol use Not on file  . Drug use: Unknown  . Sexual activity: Not on file

## 2016-12-24 ENCOUNTER — Telehealth (INDEPENDENT_AMBULATORY_CARE_PROVIDER_SITE_OTHER): Payer: Self-pay | Admitting: Orthopedic Surgery

## 2016-12-24 NOTE — Telephone Encounter (Signed)
[facility] IS CALLING FOR A SCRIPT FOR PT PHYS THERAPY.  FAX TO [phone]

## 2016-12-24 NOTE — Telephone Encounter (Signed)
Faxed script with office notes and demo sheet w/insurance information to number provided below.

## 2017-01-20 ENCOUNTER — Ambulatory Visit (INDEPENDENT_AMBULATORY_CARE_PROVIDER_SITE_OTHER): Payer: Medicare Other | Admitting: Orthopedic Surgery

## 2017-01-20 DIAGNOSIS — M25511 Pain in right shoulder: Secondary | ICD-10-CM

## 2017-01-20 DIAGNOSIS — G8929 Other chronic pain: Secondary | ICD-10-CM | POA: Diagnosis not present

## 2017-01-20 MED ORDER — TRAMADOL HCL 50 MG PO TABS: 50.0000 mg | ORAL_TABLET | Freq: Four times a day (QID) | ORAL | 0 refills | Status: DC | PRN

## 2017-01-20 NOTE — Progress Notes (Signed)
   Office Visit Note   Patient: Philip Bates           Date of Birth: 1961-06-14           MRN: [id number] Visit Date: 01/20/2017              Requested by: No referring provider defined for this encounter. PCP: No PCP Per Patient  Chief Complaint  Patient presents with  . Right Shoulder - Pain    Hx right shoulder scope, debridement distal clavicle resection RCR bicep tendon surgery      HPI: Patient is a 56 year old gentleman who is status post rotator cuff repair surgery in [place] he underwent rotator cuff repair debridement and decompression of the right shoulder. Patient presented for evaluation with myself and he was started on physical therapy. Patient states that he has done 4 physical therapy sessions and he does not feel like this is helping. He states he cannot sleep he cannot use his right arm due to pain. He states that he recently went to the emergency room was given a prescription for tramadol and this did help.  Assessment & Plan: Visit Diagnoses:  1. Chronic right shoulder pain     Plan: Recommended he continue with physical therapy will give him a prescription for tramadol. I would like Dr. Dorene Bates to evaluate his shoulder to see if there is any other arthroscopic options. Radiographically patient has a congruent glenohumeral joint so not sure if a total shoulder is an option but I discussed with the patient this would not be a very durable procedure for him.  Follow-Up Instructions: Return in about 4 weeks (around 02/17/2017).   Ortho Exam  Patient is alert, oriented, no adenopathy, well-dressed, normal affect, normal respiratory effort. Patient has abduction and flexion of the right shoulder to about 70. Patient has pain with internal and external rotation of the right shoulder pain with drop arm tests. Pain with passive range of motion of the right shoulder.  Imaging: No results found.  Labs: No results found for: HGBA1C, ESRSEDRATE, CRP, LABURIC,  REPTSTATUS, GRAMSTAIN, CULT, LABORGA  Orders:  No orders of the defined types were placed in this encounter.  Meds ordered this encounter  Medications  . traMADol (ULTRAM) 50 MG tablet    Sig: Take 1 tablet (50 mg total) by mouth every 6 (six) hours as needed for moderate pain.    Dispense:  40 tablet    Refill:  0     Procedures: No procedures performed  Clinical Data: No additional findings.  ROS:  All other systems negative, except as noted in the HPI. Review of Systems  Objective: Vital Signs: There were no vitals taken for this visit.  Specialty Comments:  No specialty comments available.  PMFS History: There are no active problems to display for this patient.  No past medical history on file.  No family history on file.  Past Surgical History:  Procedure Laterality Date  . BACK SURGERY    . HERNIA REPAIR    . LEG SURGERY    . SHOULDER SURGERY     Social History   Occupational History  . Not on file.   Social History Main Topics  . Smoking status: Current Every Day Smoker  . Smokeless tobacco: Never Used  . Alcohol use Not on file  . Drug use: Unknown  . Sexual activity: Not on file

## 2017-02-10 ENCOUNTER — Encounter: Payer: Self-pay | Admitting: Family Medicine

## 2017-02-10 ENCOUNTER — Ambulatory Visit (INDEPENDENT_AMBULATORY_CARE_PROVIDER_SITE_OTHER): Payer: Medicare HMO | Admitting: Family Medicine

## 2017-02-10 VITALS — BP 130/84 | HR 86 | Resp 12 | Ht 64.0 in | Wt 121.1 lb

## 2017-02-10 DIAGNOSIS — M545 Low back pain, unspecified: Secondary | ICD-10-CM | POA: Insufficient documentation

## 2017-02-10 DIAGNOSIS — F17200 Nicotine dependence, unspecified, uncomplicated: Secondary | ICD-10-CM | POA: Diagnosis not present

## 2017-02-10 DIAGNOSIS — G8929 Other chronic pain: Secondary | ICD-10-CM | POA: Diagnosis not present

## 2017-02-10 DIAGNOSIS — G809 Cerebral palsy, unspecified: Secondary | ICD-10-CM | POA: Diagnosis not present

## 2017-02-10 DIAGNOSIS — G4700 Insomnia, unspecified: Secondary | ICD-10-CM

## 2017-02-10 DIAGNOSIS — M5441 Lumbago with sciatica, right side: Secondary | ICD-10-CM | POA: Diagnosis not present

## 2017-02-10 DIAGNOSIS — M5442 Lumbago with sciatica, left side: Secondary | ICD-10-CM | POA: Diagnosis not present

## 2017-02-10 MED ORDER — NICOTINE 21 MG/24HR TD PT24: 21.0000 mg | MEDICATED_PATCH | Freq: Every day | TRANSDERMAL | 0 refills | Status: DC

## 2017-02-10 NOTE — Patient Instructions (Addendum)
WE NOW OFFER   Philip Bates's FAST TRACK!!!  SAME DAY Appointments for ACUTE CARE  Such as: Sprains, Injuries, cuts, abrasions, rashes, muscle pain, joint pain, back pain Colds, flu, sore throats, headache, allergies, cough, fever  Ear pain, sinus and eye infections Abdominal pain, nausea, vomiting, diarrhea, upset stomach Animal/insect bites  3 Easy Ways to Schedule: Walk-In Scheduling Call in scheduling Mychart Sign-up: [email or web address]   A few things to remember from today's visit:   Tobacco use disorder - Plan: nicotine (NICODERM CQ - DOSED IN MG/24 HOURS) 21 mg/24hr patch  Insomnia, unspecified type  Cerebral palsy, unspecified type (HCC)   Please be sure medication list is accurate. If a new problem present, please set up appointment sooner than planned today.

## 2017-02-10 NOTE — Progress Notes (Signed)
HPI:   Philip Bates is a 56 y.o. male, who is here today to establish care.  Former PCP: Dr Antonieta Loveless  Last preventive routine visit: 2017. He is reporting recent  Colonoscopy in 2017.   Chronic medical problems: Hx of back pain and OA.Hx of cerebral palsy.  He is on Meloxicam and recently started on Gabapentin. RUE numbness and burning sensation. Constant numbness of right toe.   He is currently following with ortho for right rotator cuff and biceps tendon repair. Today he is requesting  Hx of CP, he has been on disability since 2012. Also Hx of several orthopedic surgeries: Left knee, left ankle Tacheos at born, twin pregnancy other baby died.   He lives with wife. He drives. He uses a cane as needed. Rest of IADL's and ADL's independent.   Concerns today:   He is requesting referral to neurosurgeon. Has "tightness" on lower back, radiated to both LE. Left saddle anesthesia exacerbated by prolonged sitting and stable for years. He denies urine or bowel incontinence.  S/P back surgery about 3 years ago.  Because pf pain he cannot stand or walk for long time.   He is not sure about last MRI.  He smokes about a PPD.   He is also c/o difficulty sleeping. Requesting something to take to help with problem. He has not tried OTC medications. He does not recall trying Rx medication. Gabapentin does not help.    Review of Systems  Constitutional: Negative for appetite change, chills, fatigue and fever.  HENT: Negative for mouth sores, nosebleeds, sore throat and trouble swallowing.   Eyes: Negative for redness and visual disturbance.  Respiratory: Negative for cough, shortness of breath and wheezing.   Cardiovascular: Negative for chest pain, palpitations and leg swelling.  Gastrointestinal: Negative for abdominal pain, nausea and vomiting.       No changes in bowel habits.  Endocrine: Negative for cold intolerance, heat intolerance, polydipsia,  polyphagia and polyuria.  Genitourinary: Negative for decreased urine volume, hematuria and urgency.  Musculoskeletal: Positive for back pain and gait problem. Negative for neck pain.  Skin: Negative for color change and rash.  Neurological: Positive for numbness. Negative for syncope, weakness and headaches.  Psychiatric/Behavioral: Positive for sleep disturbance. Negative for confusion. The patient is not nervous/anxious.       No current outpatient prescriptions on file prior to visit.   No current facility-administered medications on file prior to visit.      Past Medical History:  Diagnosis Date  . Arthritis    Allergies  Allergen Reactions  . Flexeril [Cyclobenzaprine] Nausea Only    Family History  Problem Relation Age of Onset  . Cancer Father        esophagus  . Diabetes Brother   . Diabetes Paternal Uncle     Social History   Social History  . Marital status: Married    Spouse name: N/A  . Number of children: N/A  . Years of education: N/A   Social History Main Topics  . Smoking status: Current Every Day Smoker  . Smokeless tobacco: Never Used  . Alcohol use None  . Drug use: Unknown  . Sexual activity: Yes   Other Topics Concern  . None   Social History Narrative  . None    Vitals:   02/10/17 0940  BP: 130/84  Pulse: 86  Resp: 12  O2 sat at RA 94%  Body mass index is 20.79 kg/m.   Physical Exam  Nursing note and vitals reviewed. Constitutional: He is oriented to person, place, and time. He appears well-developed. No distress.  HENT:  Head: Atraumatic.  Mouth/Throat: Oropharynx is clear and moist and mucous membranes are normal.  Eyes: Conjunctivae and EOM are normal. Pupils are equal, round, and reactive to light.  Neck: No tracheal deviation present. No thyroid mass and no thyromegaly present.  Cardiovascular: Normal rate and regular rhythm.   No murmur heard. Pulses:      Dorsalis pedis pulses are 2+ on the right side, and 2+  on the left side.  Respiratory: Effort normal and breath sounds normal. No respiratory distress.  GI: Soft. He exhibits no mass. There is no hepatomegaly. There is no tenderness.  Musculoskeletal: He exhibits no edema.       Thoracic back: He exhibits deformity.  Scoliosis. Limping, left LE contractures, muscle atrophy. Knee limitation of ROM, L>R, extension and flexion. Right shoulder with + impingement test, empty can test.Limitation of ROM, mainly internal and external rotation. Pain elicited with movement.  Lymphadenopathy:    He has no cervical adenopathy.  Neurological: He is alert and oriented to person, place, and time. He displays atrophy.  Reflex Scores:      Patellar reflexes are 2+ on the right side and 2+ on the left side. Otherwise stable gait, not assisted today.  Skin: Skin is warm. No erythema.  Psychiatric: He has a normal mood and affect. Cognition and memory are normal.  Well groomed, good eye contact.    ASSESSMENT AND PLAN:    Philip Bates was seen today for establish care.  Diagnoses and all orders for this visit:  Chronic bilateral low back pain with bilateral sciatica  As requested referral to neurosurgeon placed.He is going to need Lumbar MRI as well. Instructed about warning signs. Continue Gabapentin.  -     Ambulatory referral to Neurosurgery -     MR Lumbar Spine Wo Contrast; Future  Tobacco use disorder  After adverse effects of tobacco discussed and some treatment options reviewed,he agrees with Nicotine patches.  -     nicotine (NICODERM CQ - DOSED IN MG/24 HOURS) 21 mg/24hr patch; Place 1 patch (21 mg total) onto the skin daily.  Insomnia, unspecified type  OTC Melatonin 5 mg may help,recommended. Gabapentin can be increased from 300 mg to 600 mg at bedtime. Good sleep hygiene. May consider Amitriptyline or Doxepin if needed.   Cerebral palsy, unspecified type (HCC)  We made copy of disability forms ,I will review documentation and if  complex I will consider referral to disability specialists.  -     MR Lumbar Spine Wo Contrast; Future       Philip G. [place]Jordan, MD  [facility]. [place] office.

## 2017-02-18 ENCOUNTER — Ambulatory Visit (INDEPENDENT_AMBULATORY_CARE_PROVIDER_SITE_OTHER): Payer: Medicare Other | Admitting: Orthopedic Surgery

## 2017-03-17 ENCOUNTER — Telehealth: Payer: Self-pay | Admitting: Family Medicine

## 2017-03-17 NOTE — Telephone Encounter (Signed)
Pt was seen as new pt on 02-10-17 and per pt gave dr Swazilandjordan some paperwork concerning student loan forgiveness due to he is disable

## 2017-03-20 NOTE — Telephone Encounter (Signed)
Forms completed base on information I have at this time as well as finding on examination during OV.  Thanks, BJ

## 2017-03-24 NOTE — Telephone Encounter (Signed)
Left voicemail for patient letting him know that the paperwork is ready to be picked up & up front.

## 2017-03-25 NOTE — Telephone Encounter (Signed)
Patient picked up forms 03/25/2017

## 2017-04-10 ENCOUNTER — Ambulatory Visit
Admission: RE | Admit: 2017-04-10 | Discharge: 2017-04-10 | Disposition: A | Discharge status: Home / Self Care | Payer: Medicare HMO | Source: Ambulatory Visit | Attending: Family Medicine | Admitting: Family Medicine

## 2017-04-10 DIAGNOSIS — G809 Cerebral palsy, unspecified: Secondary | ICD-10-CM

## 2017-04-10 DIAGNOSIS — M5441 Lumbago with sciatica, right side: Secondary | ICD-10-CM

## 2017-04-10 DIAGNOSIS — G8929 Other chronic pain: Secondary | ICD-10-CM

## 2017-04-10 DIAGNOSIS — M5442 Lumbago with sciatica, left side: Secondary | ICD-10-CM

## 2017-04-17 ENCOUNTER — Telehealth: Payer: Self-pay

## 2017-04-17 NOTE — Telephone Encounter (Signed)
Left voicemail for patient letting him know that all he needs to do is contact the Neurosurgery office to make an appointment. Left their phone number on his VM as well.

## 2017-05-19 ENCOUNTER — Ambulatory Visit: Payer: Medicare HMO | Admitting: Family Medicine
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
## 2017-06-01 ENCOUNTER — Ambulatory Visit (INDEPENDENT_AMBULATORY_CARE_PROVIDER_SITE_OTHER): Payer: Medicare HMO | Admitting: Family Medicine

## 2017-06-01 ENCOUNTER — Encounter: Payer: Self-pay | Admitting: Family Medicine

## 2017-06-01 DIAGNOSIS — M5441 Lumbago with sciatica, right side: Secondary | ICD-10-CM

## 2017-06-01 DIAGNOSIS — M5412 Radiculopathy, cervical region: Secondary | ICD-10-CM

## 2017-06-01 DIAGNOSIS — M542 Cervicalgia: Secondary | ICD-10-CM | POA: Diagnosis not present

## 2017-06-01 DIAGNOSIS — G8929 Other chronic pain: Secondary | ICD-10-CM

## 2017-06-01 LAB — BASIC METABOLIC PANEL
BUN: 14 mg/dL (ref 6–23)
CALCIUM: 9.6 mg/dL (ref 8.4–10.5)
CO2: 34 meq/L — AB (ref 19–32)
CREATININE: 0.78 mg/dL (ref 0.40–1.50)
Chloride: 104 mEq/L (ref 96–112)
GFR: 133.2 mL/min (ref >60.00)
GLUCOSE: 86 mg/dL (ref 70–99)
Potassium: 4.5 mEq/L (ref 3.5–5.1)
Sodium: 140 mEq/L (ref 135–145)

## 2017-06-01 MED ORDER — CELECOXIB 100 MG PO CAPS: 100.0000 mg | ORAL_CAPSULE | Freq: Two times a day (BID) | ORAL | 0 refills | Status: DC

## 2017-06-01 MED ORDER — PREDNISONE 20 MG PO TABS: ORAL_TABLET | ORAL | 0 refills | Status: AC

## 2017-06-01 NOTE — Patient Instructions (Addendum)
A few things to remember from today's visit:   Chronic bilateral low back pain with right-sided sciatica - Plan: Basic metabolic panel  Cervicalgia - Plan: MR Cervical Spine Wo Contrast  Radiculopathy, cervical - Plan: MR Cervical Spine Wo Contrast  Increase Gabapentin from 300 mg total 600 mg at night. Prednisone to take with breakfast, caution because can irritate his stomach. Consider taking over-the-counter Prilosec.  Please schedule an appointment with orthopedist, Dr. Lajoyce Corners.  Patient information about neurosurgeon appointment.  Faxed referral / and ov notes [facility]  (5)  Neurologist [address] [place] [id number] [phone]    Please be sure medication list is accurate. If a new problem present, please set up appointment sooner than planned today.

## 2017-06-04 ENCOUNTER — Ambulatory Visit (INDEPENDENT_AMBULATORY_CARE_PROVIDER_SITE_OTHER): Payer: Medicare HMO | Admitting: Orthopedic Surgery

## 2017-06-04 DIAGNOSIS — G8929 Other chronic pain: Secondary | ICD-10-CM | POA: Insufficient documentation

## 2017-06-04 DIAGNOSIS — M545 Low back pain, unspecified: Secondary | ICD-10-CM

## 2017-06-04 DIAGNOSIS — M25511 Pain in right shoulder: Secondary | ICD-10-CM | POA: Diagnosis not present
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
## 2017-06-28 ENCOUNTER — Other Ambulatory Visit: Payer: Self-pay | Admitting: Family Medicine

## 2017-06-28 DIAGNOSIS — M542 Cervicalgia: Secondary | ICD-10-CM
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
## 2017-07-20 ENCOUNTER — Ambulatory Visit (INDEPENDENT_AMBULATORY_CARE_PROVIDER_SITE_OTHER): Payer: Medicare Other | Admitting: Orthopedic Surgery

## 2017-08-03 ENCOUNTER — Other Ambulatory Visit: Payer: Self-pay | Admitting: Neurological Surgery

## 2017-08-04 ENCOUNTER — Encounter (INDEPENDENT_AMBULATORY_CARE_PROVIDER_SITE_OTHER): Payer: Self-pay | Admitting: Orthopedic Surgery

## 2017-08-04 ENCOUNTER — Ambulatory Visit (INDEPENDENT_AMBULATORY_CARE_PROVIDER_SITE_OTHER): Payer: Medicare HMO | Admitting: Orthopedic Surgery

## 2017-08-04 DIAGNOSIS — G8929 Other chronic pain: Secondary | ICD-10-CM | POA: Diagnosis not present

## 2017-08-04 DIAGNOSIS — M25511 Pain in right shoulder: Secondary | ICD-10-CM | POA: Diagnosis not present

## 2017-08-04 MED ORDER — LIDOCAINE HCL 1 % IJ SOLN
5.0000 mL | INTRAMUSCULAR | Status: AC | PRN
Start: 2017-08-04 — End: 2017-08-04
  Administered 2017-08-04: 5 mL

## 2017-08-04 MED ORDER — METHYLPREDNISOLONE ACETATE 40 MG/ML IJ SUSP
40.0000 mg | INTRAMUSCULAR | Status: AC | PRN
  Administered 2017-08-04: 40 mg via INTRA_ARTICULAR

## 2017-08-04 NOTE — Pre-Procedure Instructions (Signed)
Philip Bates  08/04/2017      [id number] [place]- [place], [place] - [address] [place] [address] Phone: [phone] Fax: [phone]  [facility] [address] [place], [place] - [address] [place] [address] Phone: [phone] Fax: [phone]    Your procedure is scheduled on Tuesday, August 11, 2017  [facility]" at 5:30A.M.  Call this number if you have problems the morning of surgery:  [phone]   Remember:  Do not eat food or drink liquids after midnight.  Take these medicines the morning of surgery with A SIP OF WATER: None  As of today, stop taking all Aspirins, Vitamins, Fish oils, and Herbal medications. Also stop all NSAIDS i.e. Advil, Ibuprofen, Motrin, Aleve, Anaprox, Naproxen, BC and Goody Powders.   Do not wear jewelry.  Do not wear lotions, powders, colognes, or deodorant.  Do not shave 48 hours prior to surgery.  Men may shave face and neck.  Do not bring valuables to the hospital.  [facility] is not responsible for any belongings or valuables.  Contacts, dentures or bridgework may not be worn into surgery.  Leave your suitcase in the car.  After surgery it may be brought to your room.  For patients admitted to the hospital, discharge time will be determined by your treatment team.  Patients discharged the day of surgery will not be allowed to drive home.   Special instructions: [place]- Preparing For Surgery  Before surgery, you can play an important role. Because skin is not sterile, your skin needs to be as free of germs as possible. You can reduce the number of germs on your skin by washing with CHG (chlorahexidine gluconate) Soap before surgery.  CHG is an antiseptic cleaner which kills germs and bonds with the skin to continue killing germs even after washing.  Please do not use if you have an allergy to CHG  or antibacterial soaps. If your skin becomes reddened/irritated stop using the CHG.  Do not shave (including legs and underarms) for at least 48 hours prior to first CHG shower. It is OK to shave your face.  Please follow these instructions carefully.   1. Shower the NIGHT BEFORE SURGERY and the MORNING OF SURGERY with CHG.   2. If you chose to wash your hair, wash your hair first as usual with your normal shampoo.  3. After you shampoo, rinse your hair and body thoroughly to remove the shampoo.  4. Use CHG as you would any other liquid soap. You can apply CHG directly to the skin and wash gently with a scrungie or a clean washcloth.   5. Apply the CHG Soap to your body ONLY FROM THE NECK DOWN.  Do not use on open wounds or open sores. Avoid contact with your eyes, ears, mouth and genitals (private parts). Wash Face and genitals (private parts)  with your normal soap.  6. Wash thoroughly, paying special attention to the area where your surgery will be performed.  7. Thoroughly rinse your body with warm water from the neck down.  8. DO NOT shower/wash with your normal soap after using and rinsing off the CHG Soap.  9. Pat yourself dry with a CLEAN TOWEL.  10. Wear CLEAN PAJAMAS to bed the night before surgery, wear comfortable clothes the morning of surgery  11. Place CLEAN SHEETS on your bed the night of your first  shower and DO NOT SLEEP WITH PETS.  Day of Surgery: Do not apply any deodorants/lotions. Please wear clean clothes to the hospital/surgery center.    Please read over the following fact sheets that you were given. Pain Booklet, Coughing and Deep Breathing, MRSA Information and Surgical Site Infection Prevention
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
## 2017-08-05 ENCOUNTER — Encounter (HOSPITAL_COMMUNITY)
Admission: RE | Admit: 2017-08-05 | Discharge: 2017-08-05 | Disposition: A | Discharge status: Home / Self Care | Payer: Medicare HMO | Source: Ambulatory Visit | Attending: Neurological Surgery | Admitting: Neurological Surgery

## 2017-08-05 ENCOUNTER — Encounter (HOSPITAL_COMMUNITY): Payer: Self-pay

## 2017-08-05 DIAGNOSIS — M4722 Other spondylosis with radiculopathy, cervical region: Secondary | ICD-10-CM | POA: Diagnosis not present

## 2017-08-05 DIAGNOSIS — Z01818 Encounter for other preprocedural examination: Secondary | ICD-10-CM | POA: Insufficient documentation

## 2017-08-05 HISTORY — DX: Radiculopathy, cervical region: M54.12

## 2017-08-05 HISTORY — DX: Cerebral palsy, unspecified: G80.9

## 2017-08-05 HISTORY — DX: Spondylosis without myelopathy or radiculopathy, cervical region: M47.812

## 2017-08-05 LAB — TYPE AND SCREEN
ABO/RH(D): B POS
Antibody Screen: NEGATIVE

## 2017-08-05 LAB — BASIC METABOLIC PANEL
ANION GAP: 8 (ref 5–15)
BUN: 9 mg/dL (ref 6–20)
CHLORIDE: 105 mmol/L (ref 101–111)
CO2: 24 mmol/L (ref 22–32)
CREATININE: 0.83 mg/dL (ref 0.61–1.24)
Calcium: 9.1 mg/dL (ref 8.9–10.3)
GFR calc non Af Amer: >60 mL/min (ref >60)
Glucose, Bld: 106 mg/dL — ABNORMAL HIGH (ref 65–99)
POTASSIUM: 3.9 mmol/L (ref 3.5–5.1)
SODIUM: 137 mmol/L (ref 135–145)

## 2017-08-05 LAB — CBC
HCT: 39.6 % (ref 39.0–52.0)
HEMOGLOBIN: 14.1 g/dL (ref 13.0–17.0)
MCH: 28.5 pg (ref 26.0–34.0)
MCHC: 35.6 g/dL (ref 30.0–36.0)
MCV: 80 fL (ref 78.0–100.0)
PLATELETS: 178 10*3/uL (ref 150–400)
RBC: 4.95 MIL/uL (ref 4.22–5.81)
RDW: 13.2 % (ref 11.5–15.5)
WBC: 6.8 10*3/uL (ref 4.0–10.5)

## 2017-08-05 LAB — SURGICAL PCR SCREEN
MRSA, PCR: NEGATIVE
Staphylococcus aureus: NEGATIVE

## 2017-08-05 LAB — ABO/RH: ABO/RH(D): B POS

## 2017-08-05 NOTE — Progress Notes (Signed)
PCP - Dr. Betty SwazilandJordan- [place]  Cardiologist - Denies  Chest x-ray - Denies  EKG - Denies  Stress Test - 2016-Requested  ECHO - 2016-Requested  Cardiac Cath - Denies  Sleep Study - No CPAP - None  Chart will be given to anesthesia for review due to requested cardiac records.  Pt denies having chest pain, sob, or fever at this time. All instructions explained to the pt, with a verbal understanding of the material. Pt agrees to go over the instructions while at home for a better understanding. The opportunity to ask questions was provided.

## 2017-08-06 ENCOUNTER — Encounter (HOSPITAL_COMMUNITY): Payer: Self-pay

## 2017-08-06 NOTE — Progress Notes (Addendum)
Anesthesia Chart Review:  Pt is a 56 year old male scheduled for C3-7 ACDF on 08/11/2017 with Cherrie DistanceBenjamin Ditty, MD  - PCP is Betty SwazilandJordan, MD  PMH includes:  Cerebral palsy, arthritis. Current smoker. BMI 20  Medications reviewed.   BP 119/81   Pulse 76   Temp 36.7 C   Resp 18   Ht 5\' 4"  (1.626 m)   Wt 117 lb 3.2 oz (53.2 kg)   SpO2 100%   BMI 20.12 kg/m   Preoperative labs reviewed.    Nuclear stress test [facility]/22/16 [facility]  1. Small, mild, reversible defect involving the mid to apical anterior wall. There is an additional small, mild, reversible defect involving the mid to basal inferior septal wall. There is significant gut uptake which interferes with the study interpretation, but these may represent areas of mild ischemia. 2. Normal LV systolic function. EF 60%. 3. Abnormal but low risk study.  If no changes, I anticipate pt can proceed with surgery as scheduled.   Rica Mastngela Kabbe, FNP-BC [facility]esthesiology Phone: [phone] 08/07/2017 3:48 PM

## 2017-08-10 NOTE — Anesthesia Preprocedure Evaluation (Addendum)
Anesthesia Evaluation  Patient identified by MRN, date of birth, ID band Patient awake    Reviewed: Allergy & Precautions, NPO status , Patient's Chart, lab work & pertinent test results  History of Anesthesia Complications Negative for: history of anesthetic complications  Airway Mallampati: II  TM Distance: >3 FB Neck ROM: Full    Dental  (+) Dental Advisory Given, Poor Dentition, Chipped, Missing   Pulmonary Current Smoker,    breath sounds clear to auscultation       Cardiovascular negative cardio ROS   Rhythm:Regular Rate:Normal     Neuro/Psych negative neurological ROS     GI/Hepatic negative GI ROS, Neg liver ROS,   Endo/Other  negative endocrine ROS  Renal/GU negative Renal ROS     Musculoskeletal  (+) Arthritis , Osteoarthritis,    Abdominal   Peds Cerebral palsy   Hematology negative hematology ROS (+)   Anesthesia Other Findings   Reproductive/Obstetrics                            Anesthesia Physical Anesthesia Plan  ASA: II  Anesthesia Plan: General   Post-op Pain Management:    Induction: Intravenous  PONV Risk Score and Plan: 1 and Ondansetron and Dexamethasone  Airway Management Planned: Oral ETT and Video Laryngoscope Planned  Additional Equipment:   Intra-op Plan:   Post-operative Plan: Extubation in OR  Informed Consent: I have reviewed the patients History and Physical, chart, labs and discussed the procedure including the risks, benefits and alternatives for the proposed anesthesia with the patient or authorized representative who has indicated his/her understanding and acceptance.   Dental advisory given  Plan Discussed with: CRNA and Surgeon  Anesthesia Plan Comments: (Plan routine monitors, GETA with VideoGlide intubation)        Anesthesia Quick Evaluation

## 2017-08-11 ENCOUNTER — Encounter (HOSPITAL_COMMUNITY)
Admission: RE | Disposition: A | Discharge status: Home / Self Care | Payer: Self-pay | Source: Ambulatory Visit | Attending: Neurological Surgery

## 2017-08-11 ENCOUNTER — Inpatient Hospital Stay (HOSPITAL_COMMUNITY)
Admission: RE | Admit: 2017-08-11 | Discharge: 2017-08-12 | DRG: 473 | Disposition: A | Discharge status: Home / Self Care | Payer: Medicare HMO | Source: Ambulatory Visit | Attending: Neurological Surgery | Admitting: Neurological Surgery

## 2017-08-11 ENCOUNTER — Encounter (HOSPITAL_COMMUNITY): Payer: Self-pay

## 2017-08-11 ENCOUNTER — Inpatient Hospital Stay (HOSPITAL_COMMUNITY): Payer: Medicare HMO | Admitting: Emergency Medicine

## 2017-08-11 ENCOUNTER — Inpatient Hospital Stay (HOSPITAL_COMMUNITY): Payer: Medicare HMO | Admitting: Anesthesiology

## 2017-08-11 ENCOUNTER — Inpatient Hospital Stay (HOSPITAL_COMMUNITY): Payer: Medicare HMO

## 2017-08-11 DIAGNOSIS — Z419 Encounter for procedure for purposes other than remedying health state, unspecified: Secondary | ICD-10-CM

## 2017-08-11 DIAGNOSIS — M4802 Spinal stenosis, cervical region: Secondary | ICD-10-CM | POA: Diagnosis present

## 2017-08-11 DIAGNOSIS — M4722 Other spondylosis with radiculopathy, cervical region: Secondary | ICD-10-CM | POA: Diagnosis present

## 2017-08-11 DIAGNOSIS — Z791 Long term (current) use of non-steroidal anti-inflammatories (NSAID): Secondary | ICD-10-CM | POA: Diagnosis not present

## 2017-08-11 DIAGNOSIS — F17210 Nicotine dependence, cigarettes, uncomplicated: Secondary | ICD-10-CM | POA: Diagnosis present

## 2017-08-11 DIAGNOSIS — G809 Cerebral palsy, unspecified: Secondary | ICD-10-CM | POA: Diagnosis present

## 2017-08-11 HISTORY — PX: ANTERIOR CERVICAL DECOMPRESSION/DISCECTOMY FUSION 4 LEVELS: SHX5556

## 2017-08-11 SURGERY — ANTERIOR CERVICAL DECOMPRESSION/DISCECTOMY FUSION 4 LEVELS
Anesthesia: General

## 2017-08-11 MED ORDER — CHLORHEXIDINE GLUCONATE CLOTH 2 % EX PADS: 6.0000 | MEDICATED_PAD | Freq: Once | CUTANEOUS | Status: DC

## 2017-08-11 MED ORDER — ACETAMINOPHEN 10 MG/ML IV SOLN
INTRAVENOUS | Status: DC | PRN
  Administered 2017-08-11: 1000 mg via INTRAVENOUS

## 2017-08-11 MED ORDER — DIAZEPAM 5 MG PO TABS
5.0000 mg | ORAL_TABLET | Freq: Four times a day (QID) | ORAL | Status: DC | PRN
  Administered 2017-08-11 – 2017-08-12 (×3): 5 mg via ORAL
  Filled 2017-08-11 (×3): qty 1

## 2017-08-11 MED ORDER — PANTOPRAZOLE SODIUM 40 MG IV SOLR
40.0000 mg | Freq: Every day | INTRAVENOUS | Status: DC
  Administered 2017-08-11: 40 mg via INTRAVENOUS
  Filled 2017-08-11: qty 40

## 2017-08-11 MED ORDER — BISACODYL 10 MG RE SUPP: 10.0000 mg | Freq: Every day | RECTAL | Status: DC | PRN

## 2017-08-11 MED ORDER — LIDOCAINE-EPINEPHRINE 2 %-1:100000 IJ SOLN
INTRAMUSCULAR | Status: AC
  Filled 2017-08-11: qty 1

## 2017-08-11 MED ORDER — MIDAZOLAM HCL 5 MG/5ML IJ SOLN
INTRAMUSCULAR | Status: DC | PRN
  Administered 2017-08-11: 2 mg via INTRAVENOUS

## 2017-08-11 MED ORDER — HYDROMORPHONE HCL 1 MG/ML IJ SOLN
INTRAMUSCULAR | Status: AC
  Administered 2017-08-11: 0.5 mg via INTRAVENOUS
  Filled 2017-08-11: qty 1

## 2017-08-11 MED ORDER — LIDOCAINE-EPINEPHRINE 2 %-1:100000 IJ SOLN
INTRAMUSCULAR | Status: DC | PRN
  Administered 2017-08-11: 10 mL via INTRADERMAL

## 2017-08-11 MED ORDER — SODIUM CHLORIDE 0.9% FLUSH: 3.0000 mL | INTRAVENOUS | Status: DC | PRN

## 2017-08-11 MED ORDER — THROMBIN (RECOMBINANT) 5000 UNITS EX SOLR
CUTANEOUS | Status: AC
  Filled 2017-08-11: qty 5000

## 2017-08-11 MED ORDER — SUGAMMADEX SODIUM 200 MG/2ML IV SOLN
INTRAVENOUS | Status: AC
  Filled 2017-08-11: qty 2

## 2017-08-11 MED ORDER — BUPIVACAINE-EPINEPHRINE (PF) 0.5% -1:200000 IJ SOLN
INTRAMUSCULAR | Status: AC
  Filled 2017-08-11: qty 30

## 2017-08-11 MED ORDER — ACETAMINOPHEN 500 MG PO TABS
1000.0000 mg | ORAL_TABLET | Freq: Four times a day (QID) | ORAL | Status: DC
  Administered 2017-08-11 – 2017-08-12 (×3): 1000 mg via ORAL
  Filled 2017-08-11 (×3): qty 2

## 2017-08-11 MED ORDER — SODIUM CHLORIDE 0.9% FLUSH
3.0000 mL | Freq: Two times a day (BID) | INTRAVENOUS | Status: DC
  Administered 2017-08-11 (×2): 3 mL via INTRAVENOUS

## 2017-08-11 MED ORDER — PHENYLEPHRINE HCL 10 MG/ML IJ SOLN
INTRAMUSCULAR | Status: DC | PRN
  Administered 2017-08-11 (×2): 40 ug via INTRAVENOUS

## 2017-08-11 MED ORDER — CEFAZOLIN SODIUM-DEXTROSE 2-4 GM/100ML-% IV SOLN
INTRAVENOUS | Status: AC
  Filled 2017-08-11: qty 100

## 2017-08-11 MED ORDER — ROCURONIUM BROMIDE 100 MG/10ML IV SOLN
INTRAVENOUS | Status: DC | PRN
  Administered 2017-08-11: 20 mg via INTRAVENOUS
  Administered 2017-08-11 (×4): 50 mg via INTRAVENOUS

## 2017-08-11 MED ORDER — PROPOFOL 10 MG/ML IV BOLUS
INTRAVENOUS | Status: DC | PRN
  Administered 2017-08-11: 100 mg via INTRAVENOUS

## 2017-08-11 MED ORDER — ONDANSETRON HCL 4 MG PO TABS: 4.0000 mg | ORAL_TABLET | Freq: Four times a day (QID) | ORAL | Status: DC | PRN

## 2017-08-11 MED ORDER — MIDAZOLAM HCL 2 MG/2ML IJ SOLN
INTRAMUSCULAR | Status: AC
  Filled 2017-08-11: qty 2

## 2017-08-11 MED ORDER — BUPIVACAINE-EPINEPHRINE (PF) 0.5% -1:200000 IJ SOLN
INTRAMUSCULAR | Status: DC | PRN
  Administered 2017-08-11: 10 mL via PERINEURAL

## 2017-08-11 MED ORDER — LACTATED RINGERS IV SOLN
INTRAVENOUS | Status: DC | PRN
  Administered 2017-08-11 (×3): via INTRAVENOUS

## 2017-08-11 MED ORDER — CEFAZOLIN SODIUM-DEXTROSE 1-4 GM/50ML-% IV SOLN
1.0000 g | Freq: Three times a day (TID) | INTRAVENOUS | Status: DC
  Administered 2017-08-11 – 2017-08-12 (×3): 1 g via INTRAVENOUS
  Filled 2017-08-11 (×3): qty 50

## 2017-08-11 MED ORDER — OXYCODONE HCL 5 MG PO TABS
ORAL_TABLET | ORAL | Status: AC
  Administered 2017-08-11: 5 mg via ORAL
  Filled 2017-08-11: qty 1

## 2017-08-11 MED ORDER — FLEET ENEMA 7-19 GM/118ML RE ENEM: 1.0000 | ENEMA | Freq: Once | RECTAL | Status: DC | PRN

## 2017-08-11 MED ORDER — THROMBIN (RECOMBINANT) 5000 UNITS EX SOLR
OROMUCOSAL | Status: DC | PRN
  Administered 2017-08-11 (×2): via TOPICAL

## 2017-08-11 MED ORDER — OXYCODONE HCL ER 15 MG PO T12A
15.0000 mg | EXTENDED_RELEASE_TABLET | Freq: Two times a day (BID) | ORAL | Status: DC
  Administered 2017-08-11 – 2017-08-12 (×3): 15 mg via ORAL
  Filled 2017-08-11 (×3): qty 1

## 2017-08-11 MED ORDER — 0.9 % SODIUM CHLORIDE (POUR BTL) OPTIME
TOPICAL | Status: DC | PRN
  Administered 2017-08-11: 1000 mL

## 2017-08-11 MED ORDER — DEXAMETHASONE SODIUM PHOSPHATE 10 MG/ML IJ SOLN
INTRAMUSCULAR | Status: AC
  Filled 2017-08-11: qty 1

## 2017-08-11 MED ORDER — GABAPENTIN 300 MG PO CAPS: 300.0000 mg | ORAL_CAPSULE | Freq: Three times a day (TID) | ORAL | Status: DC

## 2017-08-11 MED ORDER — ZOLPIDEM TARTRATE 5 MG PO TABS: 5.0000 mg | ORAL_TABLET | Freq: Every evening | ORAL | Status: DC | PRN

## 2017-08-11 MED ORDER — HYDROMORPHONE HCL 1 MG/ML IJ SOLN
0.2500 mg | INTRAMUSCULAR | Status: DC | PRN
  Administered 2017-08-11 (×2): 0.5 mg via INTRAVENOUS

## 2017-08-11 MED ORDER — LIDOCAINE 2% (20 MG/ML) 5 ML SYRINGE
INTRAMUSCULAR | Status: AC
  Filled 2017-08-11: qty 5

## 2017-08-11 MED ORDER — DEXAMETHASONE SODIUM PHOSPHATE 4 MG/ML IJ SOLN
2.0000 mg | Freq: Four times a day (QID) | INTRAMUSCULAR | Status: AC
  Administered 2017-08-11: 2 mg via INTRAVENOUS
  Filled 2017-08-11: qty 1

## 2017-08-11 MED ORDER — ONDANSETRON HCL 4 MG/2ML IJ SOLN
INTRAMUSCULAR | Status: DC | PRN
  Administered 2017-08-11: 4 mg via INTRAVENOUS

## 2017-08-11 MED ORDER — SODIUM CHLORIDE 0.9 % IR SOLN
Status: DC | PRN
  Administered 2017-08-11: 07:00:00

## 2017-08-11 MED ORDER — ACETAMINOPHEN 10 MG/ML IV SOLN
INTRAVENOUS | Status: AC
  Filled 2017-08-11: qty 100

## 2017-08-11 MED ORDER — ONDANSETRON HCL 4 MG/2ML IJ SOLN: 4.0000 mg | Freq: Four times a day (QID) | INTRAMUSCULAR | Status: DC | PRN

## 2017-08-11 MED ORDER — DEXAMETHASONE SODIUM PHOSPHATE 10 MG/ML IJ SOLN
INTRAMUSCULAR | Status: DC | PRN
  Administered 2017-08-11: 10 mg via INTRAVENOUS

## 2017-08-11 MED ORDER — GABAPENTIN 400 MG PO CAPS
400.0000 mg | ORAL_CAPSULE | Freq: Three times a day (TID) | ORAL | Status: DC
  Administered 2017-08-11 – 2017-08-12 (×3): 400 mg via ORAL
  Filled 2017-08-11 (×3): qty 1

## 2017-08-11 MED ORDER — ACETAMINOPHEN 325 MG PO TABS: 650.0000 mg | ORAL_TABLET | ORAL | Status: DC | PRN

## 2017-08-11 MED ORDER — ONDANSETRON HCL 4 MG/2ML IJ SOLN
INTRAMUSCULAR | Status: AC
  Filled 2017-08-11: qty 2

## 2017-08-11 MED ORDER — CELECOXIB 200 MG PO CAPS
200.0000 mg | ORAL_CAPSULE | Freq: Two times a day (BID) | ORAL | Status: DC
  Administered 2017-08-11 – 2017-08-12 (×3): 200 mg via ORAL
  Filled 2017-08-11 (×3): qty 1

## 2017-08-11 MED ORDER — SENNA 8.6 MG PO TABS
1.0000 | ORAL_TABLET | Freq: Two times a day (BID) | ORAL | Status: DC
  Administered 2017-08-11 – 2017-08-12 (×2): 8.6 mg via ORAL
  Filled 2017-08-11 (×2): qty 1

## 2017-08-11 MED ORDER — PROPOFOL 10 MG/ML IV BOLUS
INTRAVENOUS | Status: AC
  Filled 2017-08-11: qty 20

## 2017-08-11 MED ORDER — MIDAZOLAM HCL 2 MG/2ML IJ SOLN: 0.5000 mg | Freq: Once | INTRAMUSCULAR | Status: DC | PRN

## 2017-08-11 MED ORDER — MEPERIDINE HCL 25 MG/ML IJ SOLN: 6.2500 mg | INTRAMUSCULAR | Status: DC | PRN

## 2017-08-11 MED ORDER — DEXAMETHASONE SODIUM PHOSPHATE 4 MG/ML IJ SOLN: 2.0000 mg | Freq: Three times a day (TID) | INTRAMUSCULAR | Status: DC

## 2017-08-11 MED ORDER — PROMETHAZINE HCL 25 MG/ML IJ SOLN: 6.2500 mg | INTRAMUSCULAR | Status: DC | PRN

## 2017-08-11 MED ORDER — PHENOL 1.4 % MT LIQD
1.0000 | OROMUCOSAL | Status: DC | PRN
  Administered 2017-08-11: 1 via OROMUCOSAL
  Filled 2017-08-11: qty 177

## 2017-08-11 MED ORDER — DOCUSATE SODIUM 100 MG PO CAPS
100.0000 mg | ORAL_CAPSULE | Freq: Two times a day (BID) | ORAL | Status: DC
  Administered 2017-08-11 – 2017-08-12 (×2): 100 mg via ORAL
  Filled 2017-08-11 (×2): qty 1

## 2017-08-11 MED ORDER — DEXAMETHASONE 4 MG PO TABS
4.0000 mg | ORAL_TABLET | Freq: Four times a day (QID) | ORAL | Status: AC
  Administered 2017-08-11 (×2): 4 mg via ORAL
  Filled 2017-08-11 (×2): qty 1

## 2017-08-11 MED ORDER — SUGAMMADEX SODIUM 200 MG/2ML IV SOLN
INTRAVENOUS | Status: DC | PRN
Start: 2017-08-11 — End: 2017-08-11
  Administered 2017-08-11: 200 mg via INTRAVENOUS

## 2017-08-11 MED ORDER — PHENYLEPHRINE HCL 10 MG/ML IJ SOLN
INTRAVENOUS | Status: DC | PRN
  Administered 2017-08-11: 40 ug/min via INTRAVENOUS

## 2017-08-11 MED ORDER — OXYCODONE HCL 5 MG PO TABS
10.0000 mg | ORAL_TABLET | ORAL | Status: DC | PRN
  Administered 2017-08-11 – 2017-08-12 (×2): 10 mg via ORAL
  Filled 2017-08-11 (×2): qty 2

## 2017-08-11 MED ORDER — FENTANYL CITRATE (PF) 250 MCG/5ML IJ SOLN
INTRAMUSCULAR | Status: AC
  Filled 2017-08-11: qty 5

## 2017-08-11 MED ORDER — METHOCARBAMOL 750 MG PO TABS
750.0000 mg | ORAL_TABLET | Freq: Four times a day (QID) | ORAL | Status: DC
  Administered 2017-08-11 – 2017-08-12 (×4): 750 mg via ORAL
  Filled 2017-08-11 (×4): qty 1

## 2017-08-11 MED ORDER — LIDOCAINE HCL (CARDIAC) 20 MG/ML IV SOLN
INTRAVENOUS | Status: DC | PRN
  Administered 2017-08-11: 30 mg via INTRAVENOUS

## 2017-08-11 MED ORDER — ROCURONIUM BROMIDE 10 MG/ML (PF) SYRINGE
PREFILLED_SYRINGE | INTRAVENOUS | Status: AC
  Filled 2017-08-11: qty 10

## 2017-08-11 MED ORDER — CEFAZOLIN SODIUM-DEXTROSE 2-4 GM/100ML-% IV SOLN
2.0000 g | INTRAVENOUS | Status: AC
  Administered 2017-08-11: 2 g via INTRAVENOUS

## 2017-08-11 MED ORDER — OXYCODONE HCL 5 MG PO TABS
5.0000 mg | ORAL_TABLET | ORAL | Status: DC | PRN
  Administered 2017-08-11 (×2): 5 mg via ORAL
  Filled 2017-08-11: qty 1

## 2017-08-11 MED ORDER — SODIUM CHLORIDE 0.9 % IV SOLN
INTRAVENOUS | Status: DC
  Administered 2017-08-11: 18:00:00 via INTRAVENOUS

## 2017-08-11 MED ORDER — FENTANYL CITRATE (PF) 100 MCG/2ML IJ SOLN
INTRAMUSCULAR | Status: DC | PRN
  Administered 2017-08-11: 50 ug via INTRAVENOUS
  Administered 2017-08-11: 100 ug via INTRAVENOUS
  Administered 2017-08-11: 150 ug via INTRAVENOUS
  Administered 2017-08-11 (×2): 50 ug via INTRAVENOUS

## 2017-08-11 MED ORDER — MENTHOL 3 MG MT LOZG
1.0000 | LOZENGE | OROMUCOSAL | Status: DC | PRN
  Filled 2017-08-11: qty 9

## 2017-08-11 MED ORDER — ACETAMINOPHEN 650 MG RE SUPP: 650.0000 mg | RECTAL | Status: DC | PRN

## 2017-08-11 SURGICAL SUPPLY — 82 items
BIT DRILL 14MM MODULAR INVIZIA (BIT) ×1 IMPLANT
BIT DRILL INVIZIA (BIT) ×1 IMPLANT
BIT DRILL TRINICA 2.3MM (BIT) ×1 IMPLANT
BUR MATCHSTICK NEURO 3.0 LAGG (BURR) ×2 IMPLANT
BURR 2MM HEAD SOFT TOUCH DRILL (BURR) ×2 IMPLANT
CANISTER SUCT 3000ML PPV (MISCELLANEOUS) ×2 IMPLANT
CARTRIDGE OIL MAESTRO DRILL (MISCELLANEOUS) ×1 IMPLANT
CHLORAPREP W/TINT 26ML (MISCELLANEOUS) ×2 IMPLANT
CLIP TI MEDIUM 6 (CLIP) ×2 IMPLANT
DECANTER SPIKE VIAL GLASS SM (MISCELLANEOUS) ×2 IMPLANT
DERMABOND ADVANCED (GAUZE/BANDAGES/DRESSINGS) ×1
DERMABOND ADVANCED .7 DNX12 (GAUZE/BANDAGES/DRESSINGS) ×1 IMPLANT
DIFFUSER DRILL AIR PNEUMATIC (MISCELLANEOUS) ×2 IMPLANT
DRAPE C-ARM 42X72 X-RAY (DRAPES) ×4 IMPLANT
DRAPE HALF SHEET 40X57 (DRAPES) IMPLANT
DRAPE LAPAROTOMY 100X72 PEDS (DRAPES) ×2 IMPLANT
DRAPE MICROSCOPE LEICA (MISCELLANEOUS) ×2 IMPLANT
DRAPE POUCH INSTRU U-SHP 10X18 (DRAPES) ×2 IMPLANT
DRAPE SHEET LG 3/4 BI-LAMINATE (DRAPES) ×2 IMPLANT
DRILL 14MM MODULAR INVIZIA (BIT) ×2
DRILL BIT INVIZIA (BIT) ×2
DRILL BIT TRINICA 2.3MM (BIT) ×2
DRSG OPSITE POSTOP 4X6 (GAUZE/BANDAGES/DRESSINGS) ×2 IMPLANT
ELECT COATED BLADE 2.86 ST (ELECTRODE) ×2 IMPLANT
ELECT REM PT RETURN 9FT ADLT (ELECTROSURGICAL) ×2
ELECTRODE REM PT RTRN 9FT ADLT (ELECTROSURGICAL) ×1 IMPLANT
EVACUATOR 1/8 PVC DRAIN (DRAIN) ×2 IMPLANT
GAUZE SPONGE 4X4 12PLY STRL (GAUZE/BANDAGES/DRESSINGS) IMPLANT
GAUZE SPONGE 4X4 16PLY XRAY LF (GAUZE/BANDAGES/DRESSINGS) ×2 IMPLANT
GLOVE BIO SURGEON STRL SZ7 (GLOVE) ×2 IMPLANT
GLOVE BIOGEL PI IND STRL 6.5 (GLOVE) ×1 IMPLANT
GLOVE BIOGEL PI IND STRL 7.0 (GLOVE) ×1 IMPLANT
GLOVE BIOGEL PI IND STRL 7.5 (GLOVE) ×1 IMPLANT
GLOVE BIOGEL PI IND STRL 8 (GLOVE) ×1 IMPLANT
GLOVE BIOGEL PI INDICATOR 6.5 (GLOVE) ×1
GLOVE BIOGEL PI INDICATOR 7.0 (GLOVE) ×1
GLOVE BIOGEL PI INDICATOR 7.5 (GLOVE) ×1
GLOVE BIOGEL PI INDICATOR 8 (GLOVE) ×1
GLOVE ECLIPSE 7.5 STRL STRAW (GLOVE) ×8 IMPLANT
GLOVE SS BIOGEL STRL SZ 7.5 (GLOVE) ×2 IMPLANT
GLOVE SUPERSENSE BIOGEL SZ 7.5 (GLOVE) ×2
GOWN STRL REUS W/ TWL LRG LVL3 (GOWN DISPOSABLE) ×1 IMPLANT
GOWN STRL REUS W/ TWL XL LVL3 (GOWN DISPOSABLE) ×1 IMPLANT
GOWN STRL REUS W/TWL LRG LVL3 (GOWN DISPOSABLE) ×1
GOWN STRL REUS W/TWL XL LVL3 (GOWN DISPOSABLE) ×1
HEMOSTAT POWDER KIT SURGIFOAM (HEMOSTASIS) ×4 IMPLANT
KIT BASIN OR (CUSTOM PROCEDURE TRAY) ×2 IMPLANT
KIT ROOM TURNOVER OR (KITS) ×2 IMPLANT
NEEDLE HYPO 21X1.5 SAFETY (NEEDLE) ×2 IMPLANT
NEEDLE SPNL 18GX3.5 QUINCKE PK (NEEDLE) ×2 IMPLANT
NS IRRIG 1000ML POUR BTL (IV SOLUTION) ×2 IMPLANT
OIL CARTRIDGE MAESTRO DRILL (MISCELLANEOUS) ×2
PACK LAMINECTOMY NEURO (CUSTOM PROCEDURE TRAY) ×2 IMPLANT
PAD ARMBOARD 7.5X6 YLW CONV (MISCELLANEOUS) ×2 IMPLANT
PATTIES SURGICAL .5X1.5 (GAUZE/BANDAGES/DRESSINGS) ×2 IMPLANT
PEEK OPTIMA VISTA 14X14X5SPINE (Peek) ×6 IMPLANT
PEEK VISTA S 14X14X6-7 (Peek) ×2 IMPLANT
PIN DISTRACTION 14MM (PIN) ×4 IMPLANT
PIN THREADED TEMP FIX INVIZIA (PIN) ×2 IMPLANT
PLATE INVIZIA 4 LEV 72MM (Plate) ×2 IMPLANT
PUTTY BONE GRAFT KIT 2.5ML (Bone Implant) ×2 IMPLANT
RUBBERBAND STERILE (MISCELLANEOUS) ×4 IMPLANT
SCREW 16MM (Screw) ×12 IMPLANT
SCREW SELF DRILL FIXED 16MM (Screw) ×4 IMPLANT
SCREW SPINAL 42.X16MM TITAN (Screw) ×4 IMPLANT
SPONGE INTESTINAL PEANUT (DISPOSABLE) ×4 IMPLANT
SPONGE SURGIFOAM ABS GEL 100 (HEMOSTASIS) IMPLANT
STAPLER VISISTAT 35W (STAPLE) ×2 IMPLANT
STOCKINETTE 6  STRL (DRAPES) ×1
STOCKINETTE 6 STRL (DRAPES) ×1 IMPLANT
SUT STRATAFIX MNCRL+ 3-0 PS-2 (SUTURE)
SUT STRATAFIX MONOCRYL 3-0 (SUTURE)
SUT VIC AB 3-0 SH 8-18 (SUTURE) ×4 IMPLANT
SUTURE STRATFX MNCRL+ 3-0 PS-2 (SUTURE) IMPLANT
SYR 30ML LL (SYRINGE) ×2 IMPLANT
TAPE SURG TRANSPORE 1 IN (GAUZE/BANDAGES/DRESSINGS) ×1 IMPLANT
TAPE SURGICAL TRANSPORE 1 IN (GAUZE/BANDAGES/DRESSINGS) ×1
TOWEL GREEN STERILE (TOWEL DISPOSABLE) ×2 IMPLANT
TOWEL GREEN STERILE FF (TOWEL DISPOSABLE) ×4 IMPLANT
TRAY FOLEY CATH SILVER 16FR (SET/KITS/TRAYS/PACK) ×2 IMPLANT
TUBE CONNECTING 12X1/4 (SUCTIONS) ×2 IMPLANT
WATER STERILE IRR 1000ML POUR (IV SOLUTION) ×2 IMPLANT

## 2017-08-11 NOTE — Op Note (Signed)
08/11/2017  11:11 AM  PATIENT:  Philip Bates  56 y.o. 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 Odis Turck, MD  ASSISTANTS: Cindra PresumeVincent Costella, PA-C  ANESTHESIA:   General  DRAINS: Medium hemovac   SPECIMEN:  None  INDICATION FOR PROCEDURE: 56 year old man with cervical radiculopathy due to foraminal stenosis.  He did not improve with conservative treatment.  I recommended the above procedure. Patient understood the risks, benefits, and alternatives and potential outcomes and wished to proceed.  PROCEDURE DETAILS: Patient was brought to the operating room placed under general endotracheal anesthesia. Patient was placed in the supine position on the operating room table. The neck was prepped with betadine and chloraprep and draped in a sterile fashion.   A transverse incision was made on the right side of the neck. Dissection was carried down thru the subcutaneous tissue and the platysma was elevated, opened vertically, and undermined with Metzenbaum scissors. Dissection was then carried out thru an avascular plane leaving the sternocleidomastoid, carotid artery, and jugular vein laterally and the trachea and esophagus medially. The ventral aspect of the vertebral column was identified and a localizing x-ray was taken. The C3-4 level was identified. The longus colli muscles were then elevated and the retractor was placed. The annulus was incised and the disc space entered. Discectomy was performed with micro-curettes and pituitary and kerrison rongeurs. I then used the high-speed drill to drill the endplates down to the level of the posterior longitudinal ligament. The operating microscope was draped and brought into the field provided additional magnification, illumination and  visualization. Utilizing microsurgical technique, discectomy was continued posteriorly thru the disc space. Posterior longitudinal ligament was opened with a nerve hook, and then removed along with disc herniation and osteophytes, decompressing the spinal canal and thecal sac. We then continued to remove osteophytic overgrowth and disc material decompressing the neural foramina and exiting nerve roots bilaterally. So by both visualization and palpation we felt we had an adequate decompression of the neural elements. We then measured the height of the intravertebral disc space and selected a Peek interbody cage packed with equivabone. It was then gently positioned in the intravertebral disc space and countersunk.   The superior distraction pin was then removed and bone bleeding was stopped with bone wax. The distraction pin was inserted at the next inferior level. The annulus was incised and the disc space entered. Discectomy was performed with micro-curettes and pituitary and kerrison rongeurs. I then used the high-speed drill to drill the endplates down to the level of the posterior longitudinal ligament. The operating microscope was returned to the field.  The discectomy was continued posteriorly thru the disc space. Posterior longitudinal ligament was opened with a nerve hook, and then removed along with disc herniation and osteophytes, decompressing the spinal canal and thecal sac. We then continued to remove osteophytic overgrowth and disc material decompressing the neural foramina and exiting nerve roots bilaterally. So by both visualization and palpation we felt we had an adequate decompression of the neural elements. We then measured the height of the intravertebral disc space and selected a second Peek interbody cage which was packed with equivabone. It was then gently positioned in the intravertebral disc space and countersunk.   The superior distraction pin was then removed and bone bleeding was  stopped with bone wax. The  distraction pin was inserted at the next inferior level. The annulus was incised and the disc space entered. Discectomy was performed with micro-curettes and pituitary and kerrison rongeurs. I then used the high-speed drill to drill the endplates down to the level of the posterior longitudinal ligament. The operating microscope was returned to the field.  The discectomy was continued posteriorly thru the disc space. Posterior longitudinal ligament was opened with a nerve hook, and then removed along with disc herniation and osteophytes, decompressing the spinal canal and thecal sac. We then continued to remove osteophytic overgrowth and disc material decompressing the neural foramina and exiting nerve roots bilaterally. So by both visualization and palpation we felt we had an adequate decompression of the neural elements. We then measured the height of the intravertebral disc space and selected a third Peek interbody cage which was packed with equivabone. It was then gently positioned in the intravertebral disc space and countersunk.   The superior distraction pin was then again removed and bone bleeding was stopped with bone wax. The distraction pin was inserted at the most inferior level. The annulus was incised and the disc space entered. Discectomy was performed with micro-curettes and pituitary and kerrison rongeurs. I then used the high-speed drill to drill the endplates down to the level of the posterior longitudinal ligament. The operating microscope was returned to the field.  The discectomy was continued posteriorly thru the disc space. Posterior longitudinal ligament was opened with a nerve hook, and then removed along with disc herniation and osteophytes, decompressing the spinal canal and thecal sac. We then continued to remove osteophytic overgrowth and disc material decompressing the neural foramina and exiting nerve roots bilaterally. So by both visualization and  palpation we felt we had an adequate decompression of the neural elements. We then measured the height of the intravertebral disc space and selected a fourth Peek interbody cage which was packed with equivabone. It was then gently positioned in the intravertebral disc space and countersunk.   I then inserted a titanium plate and placed eight variable angle screws into the four superior vertebral bodies and two fixed angle screws at the inferior level and locked them into position. The wound was irrigated with bacitracin solution, checked for hemostasis which was established and confirmed. Once meticulous hemostasis was achieved a medium hemovac drain was placed. The platysma was closed with interrupted 3-0 undyed Vicryl suture, the subcuticular layer was closed with running undyed Vicryl suture. The skin edges were approximated with dermabond. The drapes were removed. A sterile dressing was applied. The patient was then awakened from general anesthesia and transferred to the recovery room in stable condition. At the end of the procedure all sponge, needle and instrument counts were correct.   PATIENT DISPOSITION:  PACU - hemodynamically stable.   Delay start of Pharmacological VTE agent (>24hrs) due to surgical blood loss or risk of bleeding:  yes

## 2017-08-11 NOTE — H&P (Signed)
CC:  No chief complaint on file. Neck and arm pain  HPI: Philip FothergillJoseph Bates is a 56 y.o. male with neck and arm pain.  He has multilevel cervical foraminal stenosis.  He presents for elective anterior cervical discectomy and fusion.  PMH: Past Medical History:  Diagnosis Date  . Arthritis   . Cerebral palsy (HCC)   . Cervical radiculopathy   . Spondylosis of cervical spine     PSH: Past Surgical History:  Procedure Laterality Date  . BACK SURGERY    . HERNIA REPAIR    . LEG SURGERY    . SHOULDER SURGERY      SH: Social History   Tobacco Use  . Smoking status: Current Every Day Smoker    Packs/day: 1.00    Types: Cigarettes  . Smokeless tobacco: Never Used  Substance Use Topics  . Alcohol use: No  . Drug use: No    MEDS: Prior to Admission medications   Medication Sig Start Date End Date Taking? Authorizing Provider  acetaminophen (TYLENOL) 500 MG tablet Take 1,000 mg by mouth every 6 (six) hours as needed for mild pain.   Yes [provider]  diphenhydramine-acetaminophen (TYLENOL PM) 25-500 MG TABS tablet Take 1 tablet by mouth at bedtime as needed (sleep).   Yes [provider]  gabapentin (NEURONTIN) 300 MG capsule Take 300 mg by mouth 2 (two) times daily.    Yes [provider]  meloxicam (MOBIC) 7.5 MG tablet Take 7.5 mg by mouth daily.   Yes [provider]  naproxen sodium (ALEVE) 220 MG tablet Take 440 mg by mouth daily as needed (general pain).    [provider]    ALLERGY: Allergies  Allergen Reactions  . Flexeril [Cyclobenzaprine] Nausea Only  . Naproxen Other (See Comments)    In high stomach  Irritation     ROS: ROS  NEUROLOGIC EXAM: Awake, alert, oriented Memory and concentration grossly intact Speech fluent, appropriate CN grossly intact Motor exam: Upper Extremities Deltoid Bicep Tricep Grip  Right 5/5 5/5 5/5 5/5  Left 5/5 5/5 5/5 5/5   Lower Extremity IP Quad PF DF EHL  Right 5/5 5/5 5/5  5/5 5/5  Left 5/5 5/5 5/5 5/5 5/5   Sensation grossly intact to LT  IMAGING: No new imaging  IMPRESSION: - 56 y.o. male with cervical spondylosis with radiculopathy.  His sensory and motor exam is grossly normal.  PLAN: - C3-7 ACDF - We have discussed the risks, benefits, and alternatives to surgery and he wishes to proceed.

## 2017-08-11 NOTE — Anesthesia Procedure Notes (Signed)
Procedure Name: Intubation Date/Time: 08/11/2017 7:51 AM Performed by: Teressa Lower., CRNA Pre-anesthesia Checklist: Patient identified, Emergency Drugs available, Suction available and Patient being monitored Patient Re-evaluated:Patient Re-evaluated prior to induction Oxygen Delivery Method: Circle system utilized Preoxygenation: Pre-oxygenation with 100% oxygen Induction Type: IV induction Ventilation: Mask ventilation without difficulty Laryngoscope Size: Mac and 3 Grade View: Grade II Tube type: Oral Tube size: 7.5 mm Number of attempts: 1 Airway Equipment and Method: Stylet and Oral airway Placement Confirmation: ETT inserted through vocal cords under direct vision,  positive ETCO2 and breath sounds checked- equal and bilateral Secured at: 23 cm Tube secured with: Tape Dental Injury: Teeth and Oropharynx as per pre-operative assessment  Comments: Intubation by [place]

## 2017-08-11 NOTE — Anesthesia Postprocedure Evaluation (Signed)
Anesthesia Post Note  Patient: Philip FothergillJoseph Bates  Procedure(s) Performed: Cervical three to Cervical seven Anterior Cervical discectomy with fusion/plate fixation (N/A )     Patient location during evaluation: PACU Anesthesia Type: General Level of consciousness: awake and alert, patient cooperative and oriented Pain management: pain level controlled Vital Signs Assessment: post-procedure vital signs reviewed and stable Respiratory status: spontaneous breathing, nonlabored ventilation and respiratory function stable Cardiovascular status: blood pressure returned to baseline and stable Postop Assessment: no apparent nausea or vomiting Anesthetic complications: no    Last Vitals:  Vitals:   08/11/17 1235 08/11/17 1259  BP: 98/63 126/83  Pulse: 95 99  Resp: 15 18  Temp: 37.2 C   SpO2: 100% 99%    Last Pain:  Vitals:   08/11/17 1235  TempSrc:   PainSc: 0-No pain                 Philip Bates,E. Apple Dearmas

## 2017-08-11 NOTE — Transfer of Care (Signed)
Immediate Anesthesia Transfer of Care Note  Patient: Philip FothergillJoseph Bates  Procedure(s) Performed: Cervical three to Cervical seven Anterior Cervical discectomy with fusion/plate fixation (N/A )  Patient Location: PACU  Anesthesia Type:General  Level of Consciousness: awake, alert  and oriented  Airway & Oxygen Therapy: Patient Spontanous Breathing and Patient connected to face mask oxygen  Post-op Assessment: Report given to RN and Post -op Vital signs reviewed and stable  Post vital signs: Reviewed and stable  Last Vitals:  Vitals:   08/11/17 0601  BP: 125/82  Pulse: 63  Resp: 18  Temp: 36.8 C  SpO2: 100%    Last Pain:  Vitals:   08/11/17 0601  TempSrc: Oral  PainSc:       Patients Stated Pain Goal: 3 (08/11/17 0554)  Complications: No apparent anesthesia complications

## 2017-08-11 NOTE — Progress Notes (Signed)
PT Cancellation Note  Patient Details Name: Philip FothergillJoseph Bates MRN: [id number] DOB: 12/06/1960   Cancelled Treatment:    Reason Eval/Treat Not Completed: Other (comment) Pt's family requesting to allow pt to sleep. Will check back as schedule allows.   Gladys DammeBrittany Tennyson Wacha, PT, DPT  Acute Rehabilitation Services  Pager: [phone]    Lehman PromBrittany S Cleston Lautner 08/11/2017, 2:25 PM

## 2017-08-11 NOTE — Evaluation (Signed)
Physical Therapy Evaluation Patient Details Name: Philip FothergillJoseph Pennick MRN: [id number] DOB: 11/07/1960 Today's Date: 08/11/2017   History of Present Illness  Pt is a 56 y/o male s/p C3-7 ACDF. PMH includes CP, back surgery, and shoulder surgery.   Clinical Impression  Patient is s/p above surgery resulting in the deficits listed below (see PT Problem List). PTA, pt reports he used cane for ambulation. Pt with CP at baseline as well and has L knee buckling during gait at baseline. Upon eval, pt presenting with post op pain, weakness, and decreased balance. Required min to min guard assist for mobility this session. Educated about use of RW to increase safety at home and reports wife will be able to assist at home as needed.  Patient will benefit from skilled PT to increase their independence and safety with mobility (while adhering to their precautions) to allow discharge to the venue listed below. Will continue to follow acutely.      Follow Up Recommendations No PT follow up;Supervision for mobility/OOB    Equipment Recommendations  None recommended by PT(has all necessary DME )    Recommendations for Other Services       Precautions / Restrictions Precautions Precautions: Cervical Precaution Comments: Reviewed cervical precautions with pt and adjusted brace.  Required Braces or Orthoses: Cervical Brace Cervical Brace: Hard collar Restrictions Weight Bearing Restrictions: No      Mobility  Bed Mobility Overal bed mobility: Needs Assistance Bed Mobility: Rolling;Sidelying to Sit;Sit to Sidelying Rolling: Supervision Sidelying to sit: Min assist     Sit to sidelying: Supervision General bed mobility comments: Min A for trunk elevation to come up to sitting. Educated about use of log roll technique for bed mobility.   Transfers Overall transfer level: Needs assistance Equipment used: Rolling walker (2 wheeled) Transfers: Sit to/from Stand Sit to Stand: Min guard         General  transfer comment: Min guard for safety. Verbal cues for safe hand placement.   Ambulation/Gait Ambulation/Gait assistance: Min guard Ambulation Distance (Feet): 250 Feet Assistive device: Rolling walker (2 wheeled) Gait Pattern/deviations: Step-through pattern;Decreased stride length(L knee buckling at baseline ) Gait velocity: Decreased  Gait velocity interpretation: Below normal speed for age/gender General Gait Details: Pt with CP at baseline and exhibited L knee buckling, however, pt reports this is baseline. Otherwise overall steady with use of RW. Educated about use of RW at home to increase stability.   Stairs            Wheelchair Mobility    Modified Rankin (Stroke Patients Only)       Balance Overall balance assessment: Needs assistance Sitting-balance support: No upper extremity supported;Feet supported Sitting balance-Leahy Scale: Good     Standing balance support: Bilateral upper extremity supported;During functional activity Standing balance-Leahy Scale: Poor Standing balance comment: Reliant on BUE to maintain balance                              Pertinent Vitals/Pain Pain Assessment: Faces Faces Pain Scale: Hurts little more Pain Location: neck  Pain Descriptors / Indicators: Aching;Operative site guarding Pain Intervention(s): Limited activity within patient's tolerance;Monitored during session;Repositioned    Home Living Family/patient expects to be discharged to:: Private residence Living Arrangements: Spouse/significant other Available Help at Discharge: Family;Available 24 hours/day Type of Home: House Home Access: Stairs to enter Entrance Stairs-Rails: Right;Left;Can reach both Entrance Stairs-Number of Steps: 4 Home Layout: One level Home Equipment: Walker - 2  wheels;Cane - single point;Shower seat;Toilet riser      Prior Function Level of Independence: Independent with assistive device(s)         Comments: Used cane for  ambulation      Hand Dominance        Extremity/Trunk Assessment   Upper Extremity Assessment Upper Extremity Assessment: Defer to OT evaluation    Lower Extremity Assessment Lower Extremity Assessment: LLE deficits/detail;RLE deficits/detail RLE Deficits / Details: WEakness at baseline secondary to CP.  LLE Deficits / Details: Weakness at baseline secondary to CP. KNee buckling noted during gait, however, pt reports this is baseline. Unable to extend knee during gait.     Cervical / Trunk Assessment Cervical / Trunk Assessment: Other exceptions Cervical / Trunk Exceptions: s/p ACDF   Communication   Communication: No difficulties  Cognition Arousal/Alertness: Awake/alert Behavior During Therapy: WFL for tasks assessed/performed Overall Cognitive Status: Within Functional Limits for tasks assessed                                        General Comments General comments (skin integrity, edema, etc.): Educated about generalized walking program to perform at home     Exercises     Assessment/Plan    PT Assessment Patient needs continued PT services  PT Problem List Decreased mobility;Decreased balance;Decreased knowledge of use of DME;Decreased range of motion;Decreased strength;Decreased knowledge of precautions;Pain       PT Treatment Interventions DME instruction;Gait training;Stair training;Functional mobility training;Therapeutic activities;Therapeutic exercise;Balance training;Neuromuscular re-education;Patient/family education    PT Goals (Current goals can be found in the Care Plan section)  Acute Rehab PT Goals Patient Stated Goal: to go home  PT Goal Formulation: With patient Time For Goal Achievement: 08/18/17 Potential to Achieve Goals: Good    Frequency Min 5X/week   Barriers to discharge        Co-evaluation               AM-PAC PT "6 Clicks" Daily Activity  Outcome Measure Difficulty turning over in bed (including  adjusting bedclothes, sheets and blankets)?: A Little Difficulty moving from lying on back to sitting on the side of the bed? : Unable Difficulty sitting down on and standing up from a chair with arms (e.g., wheelchair, bedside commode, etc,.)?: Unable Help needed moving to and from a bed to chair (including a wheelchair)?: A Little Help needed walking in hospital room?: A Little Help needed climbing 3-5 steps with a railing? : A Lot 6 Click Score: 13    End of Session Equipment Utilized During Treatment: Gait belt;Cervical collar Activity Tolerance: Patient tolerated treatment well Patient left: in bed;with call bell/phone within reach Nurse Communication: Mobility status PT Visit Diagnosis: Other abnormalities of gait and mobility (R26.89);Other symptoms and signs involving the nervous system (R29.898);Pain Pain - part of body: (neck )    Time: [id number] PT Time Calculation (min) (ACUTE ONLY): 17 min   Charges:   PT Evaluation $PT Eval Low Complexity: 1 Low     PT G Codes:        Gladys DammeBrittany Mamie Diiorio, PT, DPT  Acute Rehabilitation Services  Pager: [phone]   Lehman PromBrittany S Jaylene Arrowood 08/11/2017, 4:12 PM

## 2017-08-12 MED ORDER — PANTOPRAZOLE SODIUM 40 MG PO TBEC: 40.0000 mg | DELAYED_RELEASE_TABLET | Freq: Every day | ORAL | Status: DC

## 2017-08-12 MED ORDER — METHOCARBAMOL 750 MG PO TABS: 750.0000 mg | ORAL_TABLET | Freq: Four times a day (QID) | ORAL | 2 refills | Status: DC

## 2017-08-12 MED ORDER — OXYCODONE-ACETAMINOPHEN 7.5-325 MG PO TABS: 1.0000 | ORAL_TABLET | ORAL | 0 refills | Status: DC | PRN

## 2017-08-12 NOTE — Plan of Care (Signed)
  Progressing Activity: Ability to avoid complications of mobility impairment will improve 08/12/2017 0619 - Progressing by Wille CelesteMadriaga-Acosta, Glynnis Gavel D, RN Ability to tolerate increased activity will improve 08/12/2017 0619 - Progressing by Wille CelesteMadriaga-Acosta, Paschal Blanton D, RN Will remain free from falls 08/12/2017 [phone] - Progressing by Wille CelesteMadriaga-Acosta, Amarya Kuehl D, RN Education: Ability to verbalize activity precautions or restrictions will improve 08/12/2017 0619 - Progressing by Wille CelesteMadriaga-Acosta, Pansey Pinheiro D, RN Knowledge of the prescribed therapeutic regimen will improve 08/12/2017 0619 - Progressing by Wille CelesteMadriaga-Acosta, Jodeci Rini D, RN Understanding of discharge needs will improve 08/12/2017 0619 - Progressing by Wille CelesteMadriaga-Acosta, Elijahjames Fuelling D, RN Physical Regulation: Ability to maintain clinical measurements within normal limits will improve 08/12/2017 0619 - Progressing by Wille CelesteMadriaga-Acosta, Malaina Mortellaro D, RN Postoperative complications will be avoided or minimized 08/12/2017 0619 - Progressing by Wille CelesteMadriaga-Acosta, Niaomi Cartaya D, RN Pain Management: Pain level will decrease 08/12/2017 0619 - Progressing by Wille CelesteMadriaga-Acosta, Nataniel Gasper D, RN Skin Integrity: Signs of wound healing will improve 08/12/2017 0619 - Progressing by Wille CelesteMadriaga-Acosta, Dung Prien D, RN Health Behavior/Discharge Planning: Identification of resources available to assist in meeting health care needs will improve 08/12/2017 0619 - Progressing by Wille CelesteMadriaga-Acosta, Roselynne Lortz D, RN

## 2017-08-12 NOTE — Progress Notes (Deleted)
Patient alert and oriented, mae's well, voiding adequate amount of urine, swallowing without difficulty, no c/o pain at time of discharge. Patient discharged home with family. Script and discharged instructions given to patient. Patient and family stated understanding of instructions given. Patient has an appointment with Dr. Ditty  

## 2017-08-12 NOTE — Progress Notes (Signed)
Physical Therapy Treatment Patient Details Name: Philip FothergillJoseph Willmore MRN: [id number] DOB: 01/15/1961 Today's Date: 08/12/2017    History of Present Illness Pt is a 56 y/o male s/p C3-7 ACDF. PMH includes CP, back surgery, and shoulder surgery.     PT Comments    Patient seen for mobility progression. Tolerated well with no need for physical assist today. PEducated patient on precautions, mobility expectations, safety and car transfers.    Follow Up Recommendations  No PT follow up;Supervision for mobility/OOB     Equipment Recommendations  None recommended by PT(has all necessary DME )    Recommendations for Other Services       Precautions / Restrictions Precautions Precautions: Cervical Precaution Comments: Reviewed cervical precautions with pt and adjusted brace.  Required Braces or Orthoses: Cervical Brace Cervical Brace: Hard collar Restrictions Weight Bearing Restrictions: No    Mobility  Bed Mobility Overal bed mobility: Needs Assistance Bed Mobility: Rolling;Sidelying to Sit;Sit to Sidelying Rolling: Supervision Sidelying to sit: Supervision       General bed mobility comments: no physical assist today supervision for safety  Transfers Overall transfer level: Needs assistance Equipment used: Rolling walker (2 wheeled) Transfers: Sit to/from Stand Sit to Stand: Supervision         General transfer comment: supervision for safety no physical assist  Ambulation/Gait Ambulation/Gait assistance: Supervision Ambulation Distance (Feet): 420 Feet Assistive device: Rolling walker (2 wheeled) Gait Pattern/deviations: Step-through pattern;Decreased stride length(L knee buckling at baseline ) Gait velocity: Decreased  Gait velocity interpretation: Below normal speed for age/gender General Gait Details: Pt with CP at baseline and exhibited L knee buckling, however, pt reports this is baseline. Otherwise overall steady with use of RW. Educated about use of RW at home to  increase stability.    Stairs Stairs: Yes   Stair Management: One rail Right Number of Stairs: 6 General stair comments: no difficulty, min guard for safety  Wheelchair Mobility    Modified Rankin (Stroke Patients Only)       Balance Overall balance assessment: Needs assistance Sitting-balance support: No upper extremity supported;Feet supported Sitting balance-Leahy Scale: Good     Standing balance support: Bilateral upper extremity supported;During functional activity Standing balance-Leahy Scale: Fair Standing balance comment: able to release UE support today                            Cognition Arousal/Alertness: Awake/alert Behavior During Therapy: WFL for tasks assessed/performed Overall Cognitive Status: Within Functional Limits for tasks assessed                                        Exercises      General Comments        Pertinent Vitals/Pain Pain Assessment: 0-10 Pain Score: 6  Pain Location: neck  Pain Descriptors / Indicators: Aching;Operative site guarding Pain Intervention(s): Monitored during session    Home Living                      Prior Function            PT Goals (current goals can now be found in the care plan section) Acute Rehab PT Goals Patient Stated Goal: to go home  PT Goal Formulation: With patient Time For Goal Achievement: 08/18/17 Potential to Achieve Goals: Good Progress towards PT goals: Progressing toward goals  Frequency    Min 5X/week      PT Plan Current plan remains appropriate    Co-evaluation              AM-PAC PT "6 Clicks" Daily Activity  Outcome Measure  Difficulty turning over in bed (including adjusting bedclothes, sheets and blankets)?: A Little Difficulty moving from lying on back to sitting on the side of the bed? : A Little Difficulty sitting down on and standing up from a chair with arms (e.g., wheelchair, bedside commode, etc,.)?: A  Little Help needed moving to and from a bed to chair (including a wheelchair)?: A Little Help needed walking in hospital room?: A Little Help needed climbing 3-5 steps with a railing? : A Little 6 Click Score: 18    End of Session Equipment Utilized During Treatment: Gait belt;Cervical collar Activity Tolerance: Patient tolerated treatment well Patient left: in chair;with call bell/phone within reach Nurse Communication: Mobility status PT Visit Diagnosis: Other abnormalities of gait and mobility (R26.89);Other symptoms and signs involving the nervous system (R29.898);Pain Pain - part of body: (neck )     Time: 0712-0731 PT Time Calculation (min) (ACUTE ONLY): 19 min  Charges:  $Gait Training: 8-22 mins                    G Codes:       Charlotte Crumbevon Sanaii Caporaso, PT DPT  Board Certified Neurologic Specialist [phone]    Fabio AsaDevon J Chirstopher Iovino 08/12/2017, 7:48 AM

## 2017-08-12 NOTE — Evaluation (Signed)
Occupational Therapy Evaluation Patient Details Name: Philip FothergillJoseph Mimbs MRN: [id number] DOB: 05/21/1961 Today's Date: 08/12/2017    History of Present Illness Pt is a 56 y/o male s/p C3-7 ACDF. PMH includes CP, back surgery, and shoulder surgery.    Clinical Impression   Pt reports he was mod I with ADL PTA with the occasional assist from his wife with LB dressing. Currently pt requires min guard for ADL and functional mobility without use of AD in room. Began neck, safety, and ADL education with pt. Pt planning to d/c home with 24/7 supervision from family. Pt would benefit from continued skilled OT to address established goals.    Follow Up Recommendations  No OT follow up;Supervision/Assistance - 24 hour(initially)    Equipment Recommendations  None recommended by OT    Recommendations for Other Services       Precautions / Restrictions Precautions Precautions: Cervical Precaution Comments: Reviewed cervical precautions with pt and adjusted brace.  Required Braces or Orthoses: Cervical Brace Cervical Brace: Hard collar Restrictions Weight Bearing Restrictions: No      Mobility Bed Mobility        General bed mobility comments: Pt OOB in chair upon arrival  Transfers Overall transfer level: Needs assistance Equipment used: None Transfers: Sit to/from Stand Sit to Stand: Min guard         General transfer comment: Min guard for safety without use of RW. Pt reports he ambulates household distances without RW    Balance Overall balance assessment: Needs assistance Sitting-balance support: Feet supported;No upper extremity supported Sitting balance-Leahy Scale: Good     Standing balance support: No upper extremity supported;During functional activity Standing balance-Leahy Scale: Fair                            ADL either performed or assessed with clinical judgement   ADL Overall ADL's : Needs assistance/impaired Eating/Feeding: Set up;Sitting    Grooming: Supervision/safety;Standing Grooming Details (indicate cue type and reason): Educated on use of 2 cups for oral care Upper Body Bathing: Set up;Supervision/ safety;Sitting   Lower Body Bathing: Min guard;Sit to/from stand   Upper Body Dressing : Set up;Supervision/safety;Sitting   Lower Body Dressing: Min guard;Sit to/from stand Lower Body Dressing Details (indicate cue type and reason): Pt able to cross foot over opposite knee to adjust shoes Toilet Transfer: Min guard;Ambulation Toilet Transfer Details (indicate cue type and reason): Simulated by sit to stand from chair with functional mobility     Tub/ Shower Transfer: Tub transfer;Min guard;Ambulation;Shower Field seismologistseat Tub/Shower Transfer Details (indicate cue type and reason): Simulated transfer in room. Recommend supervision initially and use of shower chair Functional mobility during ADLs: Min guard General ADL Comments: Educated pt on maintaining cervical precautions during functional activities and brace management and wear     Vision         Perception     Praxis      Pertinent Vitals/Pain Pain Assessment: Faces Pain Score: 6  Faces Pain Scale: Hurts little more Pain Location: neck Pain Descriptors / Indicators: Aching;Sore Pain Intervention(s): Monitored during session     Hand Dominance     Extremity/Trunk Assessment Upper Extremity Assessment Upper Extremity Assessment: RUE deficits/detail RUE Deficits / Details: slight tingling in fingers, pain in shoulder   Lower Extremity Assessment Lower Extremity Assessment: Defer to PT evaluation   Cervical / Trunk Assessment Cervical / Trunk Assessment: Other exceptions Cervical / Trunk Exceptions: s/p ACDF    Communication Communication  Communication: No difficulties   Cognition Arousal/Alertness: Awake/alert Behavior During Therapy: WFL for tasks assessed/performed Overall Cognitive Status: Within Functional Limits for tasks assessed                                      General Comments       Exercises     Shoulder Instructions      Home Living Family/patient expects to be discharged to:: Private residence Living Arrangements: Spouse/significant other Available Help at Discharge: Family;Available 24 hours/day Type of Home: House Home Access: Stairs to enter Entergy CorporationEntrance Stairs-Number of Steps: 4 Entrance Stairs-Rails: Right;Left;Can reach both Home Layout: One level     Bathroom Shower/Tub: Tub/shower unit;Curtain   FirefighterBathroom Toilet: Standard     Home Equipment: Environmental consultantWalker - 2 wheels;Cane - single point;Shower seat;Toilet riser          Prior Functioning/Environment Level of Independence: Independent with assistive device(s)        Comments: Used cane for ambulation. Wife occasionally assisted with socks/shoes        OT Problem List: Decreased strength;Decreased range of motion;Decreased activity tolerance;Impaired balance (sitting and/or standing);Decreased knowledge of use of DME or AE;Decreased knowledge of precautions;Pain      OT Treatment/Interventions: Self-care/ADL training;Energy conservation;DME and/or AE instruction;Therapeutic activities;Patient/family education;Balance training    OT Goals(Current goals can be found in the care plan section) Acute Rehab OT Goals Patient Stated Goal: to go home  OT Goal Formulation: With patient Time For Goal Achievement: 08/26/17 Potential to Achieve Goals: Good ADL Goals Pt Will Perform Upper Body Dressing: with modified independence Pt Will Perform Lower Body Dressing: with modified independence Pt Will Perform Tub/Shower Transfer: Tub transfer;with supervision;ambulating;shower seat Additional ADL Goal #1: Pt will independently verbally recall cervical precautions and maintain during ADL.  OT Frequency: Min 2X/week   Barriers to D/C:            Co-evaluation              AM-PAC PT "6 Clicks" Daily Activity     Outcome Measure Help from  another person eating meals?: None Help from another person taking care of personal grooming?: A Little Help from another person toileting, which includes using toliet, bedpan, or urinal?: A Little Help from another person bathing (including washing, rinsing, drying)?: A Little Help from another person to put on and taking off regular upper body clothing?: A Little Help from another person to put on and taking off regular lower body clothing?: A Little 6 Click Score: 19   End of Session Equipment Utilized During Treatment: Cervical collar Nurse Communication: Mobility status;Other (comment)(no equipment or f/u needs)  Activity Tolerance: Patient tolerated treatment well Patient left: in chair;with call bell/phone within reach  OT Visit Diagnosis: Unsteadiness on feet (R26.81);Other abnormalities of gait and mobility (R26.89);Pain Pain - part of body: (neck)                Time: [id number] OT Time Calculation (min): 14 min Charges:  OT General Charges $OT Visit: 1 Visit OT Evaluation $OT Eval Moderate Complexity: 1 Mod G-Codes:     Howie Rufus A. Brett Albinooffey, M.S., OTR/L Pager: [phone]  Gaye AlkenBailey A Krissia Schreier 08/12/2017, 8:27 AM

## 2017-08-12 NOTE — Discharge Summary (Signed)
Physician Discharge Summary  Patient ID: Philip Bates MRN: [id number] DOB/AGE: 54-19-1962 54 y.o.  Admit date: 08/11/2017 Discharge date: 08/12/2017  Admission Diagnoses:  Cervical radiculopathy  Discharge Diagnoses:  Same Active Problems:   Cervical spondylosis with radiculopathy  Discharged Condition: Stable  Hospital Course:  Philip Bates is a 56 y.o. male who was admitted for the below procedure. There were no post operative complications. At time of discharge, pain was well controlled, ambulating with Pt/OT, tolerating po, voiding normal. Ready for discharge.  Treatments: Surgery C3-7 anterior discectomy with fusion and plate fixation; use of PEEK interbody spacers    Discharge Exam: Blood pressure 106/65, pulse 74, temperature 98.5 F (36.9 C), temperature source Oral, resp. rate 18, height 5\' 4"  (1.626 m), weight 53.2 kg (117 lb 3.2 oz), SpO2 97 %. Awake, alert, oriented Speech fluent, appropriate CN grossly intact 5/5 BUE/BLE Wound c/d/i  Disposition: 01-Home or Self Care  Discharge Instructions    Call MD for:  difficulty breathing, headache or visual disturbances   Complete by:  As directed    Call MD for:  persistant dizziness or light-headedness   Complete by:  As directed    Call MD for:  redness, tenderness, or signs of infection (pain, swelling, redness, odor or green/yellow discharge around incision site)   Complete by:  As directed    Call MD for:  severe uncontrolled pain   Complete by:  As directed    Call MD for:  temperature >100.4   Complete by:  As directed    Diet general   Complete by:  As directed    Driving Restrictions   Complete by:  As directed    Do not drive until given clearance.   Increase activity slowly   Complete by:  As directed    Lifting restrictions   Complete by:  As directed    Do not lift anything >10lbs. Avoid bending and twisting in awkward positions. Avoid bending at the back.   May shower / Bathe   Complete by:   As directed    In 24 hours. Okay to wash wound with warm soapy water. Avoid scrubbing the wound. Pat dry.   Remove dressing in 24 hours   Complete by:  As directed      Allergies as of 08/12/2017      Reactions   Flexeril [cyclobenzaprine] Nausea Only   Naproxen Other (See Comments)   In high stomach  Irritation       Medication List    TAKE these medications   acetaminophen 500 MG tablet Commonly known as:  TYLENOL Take 1,000 mg by mouth every 6 (six) hours as needed for mild pain.   diphenhydramine-acetaminophen 25-500 MG Tabs tablet Commonly known as:  TYLENOL PM Take 1 tablet by mouth at bedtime as needed (sleep).   gabapentin 300 MG capsule Commonly known as:  NEURONTIN Take 300 mg by mouth 2 (two) times daily.   meloxicam 7.5 MG tablet Commonly known as:  MOBIC Take 7.5 mg by mouth daily.   methocarbamol 750 MG tablet Commonly known as:  ROBAXIN Take 1 tablet (750 mg total) 4 (four) times daily by mouth.   naproxen sodium 220 MG tablet Commonly known as:  ALEVE Take 440 mg by mouth daily as needed (general pain).   oxyCODONE-acetaminophen 7.5-325 MG tablet Commonly known as:  PERCOCET Take 1 tablet every 4 (four) hours as needed by mouth for severe pain.      Follow-up Information    Ditty,  Loura Halt, MD Follow up.   Specialty:  Neurosurgery Contact information: [address] [place] [address] [phone]           Signed: Alyson Ingles 08/12/2017, 8:10 AM

## 2017-08-12 NOTE — Progress Notes (Signed)
Patient alert and oriented, mae's well, voiding adequate amount of urine, swallowing without difficulty, no c/o pain at time of discharge. Patient discharged home with family. Script and discharged instructions given to patient. Patient and family stated understanding of instructions given. Patient has an appointment with Dr. Ditty  

## 2017-08-17 ENCOUNTER — Encounter (HOSPITAL_COMMUNITY): Payer: Self-pay | Admitting: Neurological Surgery

## 2017-10-08 ENCOUNTER — Ambulatory Visit (INDEPENDENT_AMBULATORY_CARE_PROVIDER_SITE_OTHER): Payer: Medicare HMO | Admitting: Orthopedic Surgery

## 2017-10-08 ENCOUNTER — Ambulatory Visit (INDEPENDENT_AMBULATORY_CARE_PROVIDER_SITE_OTHER): Payer: Medicare HMO

## 2017-10-08 ENCOUNTER — Encounter (INDEPENDENT_AMBULATORY_CARE_PROVIDER_SITE_OTHER): Payer: Self-pay | Admitting: Orthopedic Surgery

## 2017-10-08 DIAGNOSIS — G8929 Other chronic pain: Secondary | ICD-10-CM

## 2017-10-08 DIAGNOSIS — M25511 Pain in right shoulder: Secondary | ICD-10-CM | POA: Diagnosis not present

## 2017-10-08 DIAGNOSIS — M75101 Unspecified rotator cuff tear or rupture of right shoulder, not specified as traumatic: Secondary | ICD-10-CM

## 2017-10-08 NOTE — Progress Notes (Signed)
Office Visit Note   Patient: Philip Bates           Date of Birth: [birth year]           MRN: [id number] Visit Date: 10/08/2017              Requested by: [place], Betty G, MD [address] [place], [place] [address] PCP: [place], Betty G, MD  Chief Complaint  Patient presents with  . Right Shoulder - Pain, Follow-up      HPI: Patient presents complaining of persistent right shoulder pain.  He states he cannot sleep on the right side he states that the subacromial injection provided him no relief.  He states he is currently taking Percocet for his neck pain status post anterior cervical discectomy and fusion he does smoke.  He states he has numbness tingling and burning in the anterior aspect of his shoulder.  Assessment & Plan: Visit Diagnoses:  1. Tear of right rotator cuff, unspecified tear extent     Plan: Will schedule an MRI scan of the right shoulder to evaluate the rotator cuff pathology.  Patient has had a previous distal clavicle resection and has mechanical symptoms with the shoulder possible rotator cuff pathology.  Patient has follow-up with neurosurgery in a month and we will follow-up with him after his neurosurgical follow-up.  We will obtain an MRI scan of the right shoulder.  Follow-Up Instructions: Return in about 4 weeks (around 11/05/2017).   Ortho Exam  Patient is alert, oriented, no adenopathy, well-dressed, normal affect, normal respiratory effort. On examination patient is wearing a hard cervical collar.  He has abduction and flexion of the right shoulder up to 90 degrees.  There is crepitation with range of motion of the right shoulder he is tenderness over the [facility] joint Neer and Hawkins impingement test and drop arm test are painful.  Glenohumeral motion is congruent.  Imaging: Xr Shoulder Right  Result Date: 10/08/2017 3 view radiographs of the right shoulder shows a congruent glenohumeral joint there is no Hill-Sachs lesion there is no fracture there  is no superior migration of the humeral head within the glenoid.  Radiographs do show a previous distal clavicle resection.  He has internal fixation of his cervical spine.  There are no lung field abnormalities.  No images are attached to the encounter.  Labs: No results found for: HGBA1C, ESRSEDRATE, CRP, LABURIC, REPTSTATUS, GRAMSTAIN, CULT, LABORGA  @LABSALLVALUES (HGBA1)@  There is no height or weight on file to calculate BMI.  Orders:  Orders Placed This Encounter  Procedures  . XR Shoulder Right   No orders of the defined types were placed in this encounter.    Procedures: No procedures performed  Clinical Data: No additional findings.  ROS:  All other systems negative, except as noted in the HPI. Review of Systems  Objective: Vital Signs: There were no vitals taken for this visit.  Specialty Comments:  No specialty comments available.  PMFS History: Patient Active Problem List   Diagnosis Date Noted  . Cervical spondylosis with radiculopathy 08/11/2017  . Chronic right shoulder pain 06/04/2017  . Radiculopathy, cervical 06/01/2017  . Tobacco use disorder 02/10/2017  . Insomnia 02/10/2017  . Cerebral palsy (HCC) 02/10/2017  . Chronic lower back pain 02/10/2017   Past Medical History:  Diagnosis Date  . Arthritis   . Cerebral palsy (HCC)   . Cervical radiculopathy   . Spondylosis of cervical spine     Family History  Problem Relation Age of  Onset  . Cancer Father        esophagus  . Diabetes Brother   . Diabetes Paternal Uncle     Past Surgical History:  Procedure Laterality Date  . ANTERIOR CERVICAL DECOMPRESSION/DISCECTOMY FUSION 4 LEVELS N/A 08/11/2017   Procedure: Cervical three to Cervical seven Anterior Cervical discectomy with fusion/plate fixation;  Surgeon: Ditty, Loura HaltBenjamin Jared, MD;  [facility];  Service: Neurosurgery;  Laterality: N/A;  . BACK SURGERY    . HERNIA REPAIR    . LEG SURGERY    . SHOULDER SURGERY     Social History     Occupational History  . Not on file  Tobacco Use  . Smoking status: Current Every Day Smoker    Packs/day: 1.00    Types: Cigarettes  . Smokeless tobacco: Never Used  Substance and Sexual Activity  . Alcohol use: No  . Drug use: No  . Sexual activity: Yes

## 2017-10-09 ENCOUNTER — Encounter: Payer: Self-pay | Admitting: Family Medicine

## 2017-10-09 ENCOUNTER — Ambulatory Visit (INDEPENDENT_AMBULATORY_CARE_PROVIDER_SITE_OTHER): Payer: Medicare HMO | Admitting: Family Medicine

## 2017-10-09 VITALS — BP 135/86 | HR 78 | Temp 98.2°F | Resp 12 | Ht 64.0 in | Wt 114.1 lb

## 2017-10-09 DIAGNOSIS — M4722 Other spondylosis with radiculopathy, cervical region: Secondary | ICD-10-CM | POA: Diagnosis not present

## 2017-10-09 DIAGNOSIS — F17200 Nicotine dependence, unspecified, uncomplicated: Secondary | ICD-10-CM | POA: Diagnosis not present

## 2017-10-09 DIAGNOSIS — G4700 Insomnia, unspecified: Secondary | ICD-10-CM | POA: Diagnosis not present

## 2017-10-09 DIAGNOSIS — G809 Cerebral palsy, unspecified: Secondary | ICD-10-CM | POA: Diagnosis not present

## 2017-10-09 MED ORDER — DIAZEPAM 5 MG PO TABS: 5.0000 mg | ORAL_TABLET | Freq: Two times a day (BID) | ORAL | 1 refills | Status: DC | PRN

## 2017-10-09 NOTE — Patient Instructions (Signed)
A few things to remember from today's visit:   Insomnia, unspecified type - Plan: diazepam (VALIUM) 5 MG tablet  Cervical spondylosis with radiculopathy  Cerebral palsy, unspecified type (HCC) - Plan: diazepam (VALIUM) 5 MG tablet    ? What can I do to sleep better?   Improving your sleep habits is a good start.   Medical or psychiatric conditions might be making your insomnia worse.  Medicine might help, but you shouldn't use sleeping pills long term.   Some people need more sleep than others.   Sleep usually occurs in two- to three-hour cycles, so it is important to get at least three uninterrupted hours of sleep.  The following tips can help you develop better sleep habits: Go to bed and wake up at the same time each day Lie down to sleep only when sleepy. If you can't sleep after 20 minutes, get out of bed and go to another room; return to the bedroom when you are tired; repeat as necessary. Use bedroom for sleep and sex only. Don't do things in bed that might keep you awake, like watching television, reading, talking on the phone, or worrying Avoid caffeine, nicotine, or alcohol for at least four to six hours beforebedtime\ls1Avoid strenuous exercise within four hours of bedtime. Avoid daytime napping. Relax before going to bed. Avoid eating large meals or drinking a lot of water or other liquids in the evening. Keep the bedroom a comfortable temperature. Use earplugs if noise is a problem. Expose yourself to daytime light for at least 30 minutes each morning   Diazepam tablets What is this medicine? DIAZEPAM (dye AZ e pam) is a benzodiazepine. It is used to treat anxiety and nervousness. It also can help treat alcohol withdrawal, relax muscles, and treat certain types of seizures. This medicine may be used for other purposes; ask your health care provider or pharmacist if you have questions. COMMON BRAND NAME(S): Valium What should I tell my health care provider  before I take this medicine? They need to know if you have any of these conditions -an alcohol or drug abuse problem -bipolar disorder, depression, psychosis or other mental health condition -glaucoma -kidney or liver disease -lung or breathing disease -myasthenia gravis -Parkinson's disease -seizures or a history of seizures -suicidal thoughts -an unusual or allergic reaction to diazepam, other benzodiazepines, foods, dyes, or preservatives -pregnant or trying to get pregnant -breast-feeding How should I use this medicine? Take this medicine by mouth with a glass of water. Follow the directions on the prescription label. If this medicine upsets your stomach, take it with food or milk. Take your doses at regular intervals. Do not take your medicine more often than directed. If you have been taking this medicine regularly for some time, do not suddenly stop taking it. You must gradually reduce the dose or you may get severe side effects. Ask your doctor or health care professional for advice. Even after you stop taking this medicine it can still affect your body for several days. A special MedGuide will be given to you by the pharmacist with each prescription and refill. Be sure to read this information carefully each time. Talk to your pediatrician regarding the use of this medicine in children. Special care may be needed. Overdosage: If you think you have taken too much of this medicine contact a poison control center or emergency room at once. NOTE: This medicine is only for you. Do not share this medicine with others. What if I miss a dose?  If you miss a dose, take it as soon as you can. If it is almost time for your next dose, take only that dose. Do not take double or extra doses. What may interact with this medicine? Do not take this medicine with any of the following medications: -narcotic medicines for cough -sodium oxybate This medicine may also interact with the following  medications: -alcohol -antacids -antihistamines for allergy, cough and cold -certain antibiotics like clarithromycin, erythromycin, rifampin -certain medicines for anxiety or sleep -certain medicines for blood pressure, heart disease, irregular heartbeat -certain medicines for depression, like amitriptyline, fluoxetine, sertraline -certain medicines for fungal infections like ketoconazole, itraconazole, clotrimazole -certain medicines for psychotic disturbances -certain medicines for seizures like carbamazepine, phenobarbital, phenytoin, primidone, valproate -cimetidine -cyclosporine -dexamethasone -general anesthetics like lidocaine, pramoxine, tetracaine -MAOIs like Carbex, Eldepryl, Marplan, Nardil, and Parnate -medicines that relax muscles for surgery -narcotic medicines for pain -omeprazole -paclitaxel -phenothiazines like chlorpromazine, mesoridazine, prochlorperazine, thioridazine -theophyline -warfarin This list may not describe all possible interactions. Give your health care provider a list of all the medicines, herbs, non-prescription drugs, or dietary supplements you use. Also tell them if you smoke, drink alcohol, or use illegal drugs. Some items may interact with your medicine. What should I watch for while using this medicine? Tell your doctor or health care professional if your symptoms do not start to get better or if they get worse. Do not stop taking except on your doctor's advice. You may develop a severe reaction. Your doctor will tell you how much medicine to take. You may get drowsy or dizzy. Do not drive, use machinery, or do anything that needs mental alertness until you know how this medicine affects you. To reduce the risk of dizzy and fainting spells, do not stand or sit up quickly, especially if you are an older patient. Alcohol may increase dizziness and drowsiness. Avoid alcoholic drinks. If you are taking another medicine that also causes drowsiness, you  may have more side effects. Give your health care provider a list of all medicines you use. Your doctor will tell you how much medicine to take. Do not take more medicine than directed. Call emergency for help if you have problems breathing or unusual sleepiness. What side effects may I notice from receiving this medicine? Side effects that you should report to your doctor or health care professional as soon as possible: -allergic reactions like skin rash, itching or hives, swelling of the face, lips, or tongue -breathing problems -confusion -loss of balance or coordination -signs and symptoms of low blood pressure like dizziness; feeling faint or lightheaded, falls; unusually weak or tired -suicidal thoughts or other mood changes -trouble passing urine or change in the amount of urine Side effects that usually do not require medical attention (report to your doctor or health care professional if they continue or are bothersome): -dizziness -headache -nausea, vomiting -tiredness This list may not describe all possible side effects. Call your doctor for medical advice about side effects. You may report side effects to FDA at 1-800-FDA-1088. Where should I keep my medicine? Keep out of the reach of children. This medicine can be abused. Keep your medicine in a safe place to protect it from theft. Do not share this medicine with anyone. Selling or giving away this medicine is dangerous and against the law. This medicine may cause accidental overdose and death if taken by other adults, children, or pets. Mix any unused medicine with a substance like cat litter or coffee grounds. Then throw the  medicine away in a sealed container like a sealed bag or a coffee can with a lid. Do not use the medicine after the expiration date. Store at room temperature between 15 and 30 degrees C (59 and 86 degrees F). Protect from light. Keep container tightly closed. NOTE: This sheet is a summary. It may not cover all  possible information. If you have questions about this medicine, talk to your doctor, pharmacist, or health care provider.  2018 Elsevier/Gold Standard (2015-06-22 10:52:36)   Please be sure medication list is accurate. If a new problem present, please set up appointment sooner than planned today.
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
## 2017-10-15 ENCOUNTER — Encounter (HOSPITAL_COMMUNITY): Payer: Self-pay

## 2017-10-15 ENCOUNTER — Other Ambulatory Visit: Payer: Self-pay

## 2017-10-15 ENCOUNTER — Emergency Department (HOSPITAL_COMMUNITY): Payer: Medicare HMO

## 2017-10-15 ENCOUNTER — Emergency Department (HOSPITAL_COMMUNITY)
Admission: EM | Admit: 2017-10-15 | Discharge: 2017-10-16 | Disposition: A | Discharge status: Home / Self Care | Payer: Medicare HMO | Attending: Emergency Medicine | Admitting: Emergency Medicine

## 2017-10-15 DIAGNOSIS — G8929 Other chronic pain: Secondary | ICD-10-CM | POA: Insufficient documentation

## 2017-10-15 DIAGNOSIS — Z79899 Other long term (current) drug therapy: Secondary | ICD-10-CM | POA: Insufficient documentation

## 2017-10-15 DIAGNOSIS — G809 Cerebral palsy, unspecified: Secondary | ICD-10-CM | POA: Insufficient documentation

## 2017-10-15 DIAGNOSIS — F17210 Nicotine dependence, cigarettes, uncomplicated: Secondary | ICD-10-CM | POA: Diagnosis not present

## 2017-10-15 DIAGNOSIS — M25511 Pain in right shoulder: Secondary | ICD-10-CM | POA: Insufficient documentation

## 2017-10-15 LAB — BASIC METABOLIC PANEL
ANION GAP: 8 (ref 5–15)
BUN: 10 mg/dL (ref 6–20)
CO2: 27 mmol/L (ref 22–32)
Calcium: 9.2 mg/dL (ref 8.9–10.3)
Chloride: 101 mmol/L (ref 101–111)
Creatinine, Ser: 0.72 mg/dL (ref 0.61–1.24)
Glucose, Bld: 95 mg/dL (ref 65–99)
POTASSIUM: 3.8 mmol/L (ref 3.5–5.1)
SODIUM: 136 mmol/L (ref 135–145)

## 2017-10-15 LAB — I-STAT TROPONIN, ED: Troponin i, poc: 0 ng/mL (ref 0.00–0.08)

## 2017-10-15 LAB — CBC
HEMATOCRIT: 37.8 % — AB (ref 39.0–52.0)
Hemoglobin: 12.7 g/dL — ABNORMAL LOW (ref 13.0–17.0)
MCH: 27 pg (ref 26.0–34.0)
MCHC: 33.6 g/dL (ref 30.0–36.0)
MCV: 80.4 fL (ref 78.0–100.0)
Platelets: 177 10*3/uL (ref 150–400)
RBC: 4.7 MIL/uL (ref 4.22–5.81)
RDW: 13.4 % (ref 11.5–15.5)
WBC: 5.3 10*3/uL (ref 4.0–10.5)

## 2017-10-15 MED ORDER — OXYCODONE-ACETAMINOPHEN 5-325 MG PO TABS
2.0000 | ORAL_TABLET | Freq: Once | ORAL | Status: AC
  Administered 2017-10-16: 2 via ORAL
  Filled 2017-10-15: qty 2
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
## 2017-10-16 ENCOUNTER — Telehealth (INDEPENDENT_AMBULATORY_CARE_PROVIDER_SITE_OTHER): Payer: Self-pay | Admitting: Orthopedic Surgery

## 2017-10-16 LAB — I-STAT TROPONIN, ED: TROPONIN I, POC: 0.01 ng/mL (ref 0.00–0.08)
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
## 2017-10-16 NOTE — Telephone Encounter (Signed)
IC s/w patient and he stated that his question had already been addressed and he had received a prescription for pain medication.

## 2017-10-16 NOTE — Discharge Instructions (Signed)
Please call your neurosurgical team for follow-up regarding your ongoing discomfort and pain

## 2017-10-16 NOTE — ED Notes (Signed)
Pt departed in NAD, refused use of wheelchair.  

## 2017-10-16 NOTE — Telephone Encounter (Signed)
Patient left a vm stating he was seen in the ED, the medication is not working for him so is requesting Percocet. CB # [phone]

## 2017-10-20 ENCOUNTER — Ambulatory Visit
Admit: 2017-10-20 | Discharge: 2017-10-20 | Disposition: A | Discharge status: Home / Self Care | Payer: Medicare HMO | Attending: Orthopedic Surgery | Admitting: Orthopedic Surgery

## 2017-10-20 DIAGNOSIS — G8929 Other chronic pain: Secondary | ICD-10-CM

## 2017-10-20 DIAGNOSIS — M25511 Pain in right shoulder: Principal | ICD-10-CM

## 2017-10-22 ENCOUNTER — Telehealth (INDEPENDENT_AMBULATORY_CARE_PROVIDER_SITE_OTHER): Payer: Self-pay | Admitting: Orthopedic Surgery

## 2017-10-22 NOTE — Telephone Encounter (Signed)
Patient called asking for a refill on his Tramadol. CB # [phone]

## 2017-10-22 NOTE — Telephone Encounter (Signed)
Call in rx for tramedol 

## 2017-10-23 ENCOUNTER — Telehealth (INDEPENDENT_AMBULATORY_CARE_PROVIDER_SITE_OTHER): Payer: Self-pay | Admitting: Orthopedic Surgery

## 2017-10-23 MED ORDER — TRAMADOL HCL 50 MG PO TABS: 50.0000 mg | ORAL_TABLET | Freq: Four times a day (QID) | ORAL | 0 refills | Status: DC | PRN

## 2017-10-23 NOTE — Addendum Note (Signed)
Addended by: Donalee CitrinPEELE, STEPHENEY L on: 10/23/2017 08:27 AM   Modules accepted: Orders

## 2017-10-23 NOTE — Telephone Encounter (Signed)
I called rx in again after confirming again for 2nd time pharmacy which is [facility] on [address]. I did call patient to make aware, he would like us to try to speak with pharmacy staff to ensure they did receive. I am currently on hold with their pharmacy now.

## 2017-10-23 NOTE — Telephone Encounter (Signed)
Patient called stating that his pharmacy has not received the prescription for his medication.  Please advise.

## 2017-10-23 NOTE — Telephone Encounter (Signed)
I called in rx for tramadol to [facility] pharmacy [facility] [address]. Patient is aware.

## 2017-10-23 NOTE — Telephone Encounter (Signed)
Please send prescription [facility] on   [address], [place], [place] [id number] Pt stated on the phone that he had another prescription to pick up at  that particular pharmacy.

## 2017-10-23 NOTE — Telephone Encounter (Signed)
[facility] spoke with [facility]ist, she explained per new [facility] protocol, they are only allowed to fill 5 day supply of tramadol, and cannot fill the full prescription. If patient was written rx for surgical dx than they would still only be able to fill 7 day supply. Advised to hold on rx at this time and I would call the patient and let him know. I left him voicemail advising of above message asked he please call back and let us know what he would like to do.

## 2017-10-23 NOTE — Telephone Encounter (Signed)
Done [facility] [address] instead for patient.

## 2017-10-23 NOTE — Telephone Encounter (Signed)
I [facility] [address] instead for patient.

## 2017-11-03 ENCOUNTER — Other Ambulatory Visit: Payer: Self-pay | Admitting: Family Medicine

## 2017-11-03 DIAGNOSIS — G4700 Insomnia, unspecified: Secondary | ICD-10-CM

## 2017-11-03 DIAGNOSIS — G809 Cerebral palsy, unspecified: Secondary | ICD-10-CM

## 2017-11-03 NOTE — Telephone Encounter (Signed)
Last OV: 10/09/17 Last filled: 10/09/17 Sig: TAKE 1 TABLET BY MOUTH EVERY 12 HOURS AS NEEDED, #30, 1 RF

## 2017-11-04 ENCOUNTER — Ambulatory Visit (INDEPENDENT_AMBULATORY_CARE_PROVIDER_SITE_OTHER): Payer: Medicare HMO | Admitting: Orthopedic Surgery

## 2017-11-04 ENCOUNTER — Encounter (INDEPENDENT_AMBULATORY_CARE_PROVIDER_SITE_OTHER): Payer: Self-pay | Admitting: Orthopedic Surgery

## 2017-11-04 DIAGNOSIS — M75101 Unspecified rotator cuff tear or rupture of right shoulder, not specified as traumatic: Secondary | ICD-10-CM

## 2017-11-04 MED ORDER — TRAMADOL HCL 50 MG PO TABS: 50.0000 mg | ORAL_TABLET | Freq: Four times a day (QID) | ORAL | 0 refills | Status: DC | PRN
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
## 2017-11-09 NOTE — Telephone Encounter (Signed)
Rx for Diazepam given was to take once daily, somehow pharmacy filled Rx earlier. He is due 11/22/17.  Thanks, BJ

## 2017-11-10 ENCOUNTER — Ambulatory Visit (HOSPITAL_COMMUNITY): Payer: Medicare HMO | Admitting: Psychiatry

## 2017-11-12 ENCOUNTER — Ambulatory Visit (INDEPENDENT_AMBULATORY_CARE_PROVIDER_SITE_OTHER): Payer: Medicare HMO | Admitting: Orthopedic Surgery

## 2017-11-17 ENCOUNTER — Encounter: Payer: Self-pay | Admitting: Orthopedic Surgery

## 2017-11-17 DIAGNOSIS — M7541 Impingement syndrome of right shoulder: Secondary | ICD-10-CM | POA: Diagnosis not present

## 2017-11-17 DIAGNOSIS — M75121 Complete rotator cuff tear or rupture of right shoulder, not specified as traumatic: Secondary | ICD-10-CM | POA: Diagnosis not present

## 2017-11-23 ENCOUNTER — Telehealth (INDEPENDENT_AMBULATORY_CARE_PROVIDER_SITE_OTHER): Payer: Self-pay | Admitting: Orthopedic Surgery

## 2017-11-23 NOTE — Telephone Encounter (Signed)
Pt had perocet 7.5/325 #30 10/16/17, tramadol 50mg  #60 10/23/17 and #60 on 11/04/17, percocet 5/325 #40 on 11/17/17 this was given the day of surgery [facility]( [facility]) he has an appt tomorrow advised if the pain is this significant lets keep the appt tomorrow so that we can discuss with Dr. Lajoyce Cornersuda and have him examine post op shoulder. Voiced understanding and will call with questions.

## 2017-11-23 NOTE — Telephone Encounter (Signed)
Patient called advised (Oxycodone) is not strong enough for the pain he is having. Patient asked if he can get something stronger? Patient advised he is out of pain medicine.  Patient advised he has an appointment tomorrow but asked if he need to reschedule for 2 weeks out. The number to contact patient is [phone]

## 2017-11-24 ENCOUNTER — Ambulatory Visit (INDEPENDENT_AMBULATORY_CARE_PROVIDER_SITE_OTHER): Payer: Medicare HMO | Admitting: Orthopedic Surgery

## 2017-11-24 ENCOUNTER — Encounter (INDEPENDENT_AMBULATORY_CARE_PROVIDER_SITE_OTHER): Payer: Self-pay | Admitting: Orthopedic Surgery

## 2017-11-24 VITALS — Ht 64.0 in | Wt 114.0 lb

## 2017-11-24 DIAGNOSIS — M75101 Unspecified rotator cuff tear or rupture of right shoulder, not specified as traumatic: Secondary | ICD-10-CM

## 2017-11-24 MED ORDER — OXYCODONE-ACETAMINOPHEN 5-325 MG PO TABS: 1.0000 | ORAL_TABLET | Freq: Four times a day (QID) | ORAL | 0 refills | Status: DC | PRN

## 2017-11-24 NOTE — Progress Notes (Signed)
Office Visit Note   Patient: Philip Bates           Date of Birth: [birth year]           MRN: [id number] Visit Date: 11/24/2017              Requested by: [place], Betty G, MD [address] [place], [place] [address] PCP: [place], Betty G, MD  Chief Complaint  Patient presents with  . Right Shoulder - Routine Post Op, Pain      HPI: Patient is a 57 year old gentleman who presents 1 week status post right shoulder arthroscopy.  Patient states he is making good progress with his range of motion.  Assessment & Plan: Visit Diagnoses:  1. Tear of right rotator cuff, unspecified tear extent     Plan: Patient states he does not want to proceed with formalized physical therapy.  He was given instructions and demonstrated internal and external rotation strengthening and scapular stabilization.  Follow-Up Instructions: Return in about 4 weeks (around 12/22/2017).   Ortho Exam  Patient is alert, oriented, no adenopathy, well-dressed, normal affect, normal respiratory effort. Examination the portals are clean and dry the sutures are harvested.  Patient has abduction and flexion to 100 degrees.  There is no redness no cellulitis no signs of infection.  Imaging: No results found. No images are attached to the encounter.  Labs: No results found for: HGBA1C, ESRSEDRATE, CRP, LABURIC, REPTSTATUS, GRAMSTAIN, CULT, LABORGA  @LABSALLVALUES (HGBA1)@  Body mass index is 19.57 kg/m.  Orders:  No orders of the defined types were placed in this encounter.  Meds ordered this encounter  Medications  . oxyCODONE-acetaminophen (PERCOCET/ROXICET) 5-325 MG tablet    Sig: Take 1 tablet by mouth every 6 (six) hours as needed for severe pain.    Dispense:  30 tablet    Refill:  0     Procedures: No procedures performed  Clinical Data: No additional findings.  ROS:  All other systems negative, except as noted in the HPI. Review of Systems  Objective: Vital Signs: Ht 5\' 4"  (1.626  m)   Wt 114 lb (51.7 kg)   BMI 19.57 kg/m   Specialty Comments:  No specialty comments available.  PMFS History: Patient Active Problem List   Diagnosis Date Noted  . Cervical spondylosis with radiculopathy 08/11/2017  . Chronic right shoulder pain 06/04/2017  . Radiculopathy, cervical 06/01/2017  . Tobacco use disorder 02/10/2017  . Insomnia 02/10/2017  . Cerebral palsy (HCC) 02/10/2017  . Chronic lower back pain 02/10/2017   Past Medical History:  Diagnosis Date  . Arthritis   . Cerebral palsy (HCC)   . Cervical radiculopathy   . Spondylosis of cervical spine     Family History  Problem Relation Age of Onset  . Cancer Father        esophagus  . Diabetes Brother   . Diabetes Paternal Uncle     Past Surgical History:  Procedure Laterality Date  . ANTERIOR CERVICAL DECOMPRESSION/DISCECTOMY FUSION 4 LEVELS N/A 08/11/2017   Procedure: Cervical three to Cervical seven Anterior Cervical discectomy with fusion/plate fixation;  Surgeon: Ditty, Loura Halt, MD;  Location: [facility] OR;  Service: Neurosurgery;  Laterality: N/A;  . BACK SURGERY    . HERNIA REPAIR    . LEG SURGERY    . SHOULDER SURGERY     Social History   Occupational History  . Not on file  Tobacco Use  . Smoking status: Current Every Day Smoker    Packs/day: 1.00  Types: Cigarettes  . Smokeless tobacco: Never Used  Substance and Sexual Activity  . Alcohol use: No  . Drug use: No  . Sexual activity: Yes

## 2017-11-30 ENCOUNTER — Telehealth (INDEPENDENT_AMBULATORY_CARE_PROVIDER_SITE_OTHER): Payer: Self-pay | Admitting: Orthopedic Surgery

## 2017-11-30 ENCOUNTER — Other Ambulatory Visit (INDEPENDENT_AMBULATORY_CARE_PROVIDER_SITE_OTHER): Payer: Self-pay | Admitting: Orthopedic Surgery

## 2017-11-30 MED ORDER — OXYCODONE-ACETAMINOPHEN 5-325 MG PO TABS: 1.0000 | ORAL_TABLET | Freq: Four times a day (QID) | ORAL | 0 refills | Status: DC | PRN

## 2017-11-30 NOTE — Telephone Encounter (Signed)
Oxycodone-acetaminophen (Percocet/Roxicet) 5-325 mg   Pharmacy [facility] [address]

## 2017-11-30 NOTE — Telephone Encounter (Signed)
rx written

## 2017-11-30 NOTE — Telephone Encounter (Signed)
Pt called left VM for med refill [facility]  [address]

## 2017-11-30 NOTE — Telephone Encounter (Signed)
Pt is 2 weeks out s/p right shoulder scope. Pt requesting refill on his Percocet. He received #30 on 11/24/16 and #40 on 11/17/17 please advise.

## 2017-11-30 NOTE — Telephone Encounter (Signed)
Called and advised pt that rx is at the front desk to pick up tomorrow morning.

## 2017-12-07 ENCOUNTER — Telehealth (INDEPENDENT_AMBULATORY_CARE_PROVIDER_SITE_OTHER): Payer: Self-pay | Admitting: Orthopedic Surgery

## 2017-12-07 ENCOUNTER — Other Ambulatory Visit (INDEPENDENT_AMBULATORY_CARE_PROVIDER_SITE_OTHER): Payer: Self-pay | Admitting: Orthopedic Surgery

## 2017-12-07 MED ORDER — OXYCODONE-ACETAMINOPHEN 5-325 MG PO TABS: 1.0000 | ORAL_TABLET | Freq: Three times a day (TID) | ORAL | 0 refills | Status: DC | PRN

## 2017-12-07 NOTE — Telephone Encounter (Signed)
rx written

## 2017-12-07 NOTE — Telephone Encounter (Signed)
Pt is s/p right shoulder scope and debridement  11/17/17. Requesting refill on Percocet 5/325 has had qty 100 the month of February. Please advise.

## 2017-12-07 NOTE — Telephone Encounter (Signed)
Called and advised pt that rx is redy for pick up

## 2017-12-07 NOTE — Telephone Encounter (Signed)
Patient called requesting an RX refill on his Percocet.  CB#[phone].  Thank you.

## 2017-12-14 ENCOUNTER — Telehealth (INDEPENDENT_AMBULATORY_CARE_PROVIDER_SITE_OTHER): Payer: Self-pay | Admitting: Orthopedic Surgery

## 2017-12-14 NOTE — Telephone Encounter (Signed)
I called pt to advise of below and the pt states that he has an appt on 12/22/17 and he will wait until then. He states that he has had injections before in the past and that they do not work and the only thing that works for him is pain medication. Advised that he will discuss this with Dr. Lajoyce Cornersuda at appt on monday

## 2017-12-14 NOTE — Telephone Encounter (Signed)
Pt is s/p a shoulder scope. Multiple rx since surgery. 120 since 11/17/17

## 2017-12-14 NOTE — Telephone Encounter (Signed)
Patient requesting refill on percocet.   °

## 2017-12-18 ENCOUNTER — Telehealth (INDEPENDENT_AMBULATORY_CARE_PROVIDER_SITE_OTHER): Payer: Self-pay | Admitting: Orthopedic Surgery

## 2017-12-18 ENCOUNTER — Other Ambulatory Visit (INDEPENDENT_AMBULATORY_CARE_PROVIDER_SITE_OTHER): Payer: Self-pay

## 2017-12-18 MED ORDER — TRAMADOL HCL 50 MG PO TABS: 50.0000 mg | ORAL_TABLET | Freq: Two times a day (BID) | ORAL | 0 refills | Status: DC | PRN

## 2017-12-18 NOTE — Telephone Encounter (Signed)
OK for tramadol # 15 tablets one po bid prn pain . [place] to [place] thanks

## 2017-12-18 NOTE — Telephone Encounter (Signed)
Medication called in and pt advised. Routing to Dr. Lajoyce Cornersuda per Dr. Ophelia CharterYates, as Lorain ChildesFYI.

## 2017-12-18 NOTE — Telephone Encounter (Signed)
Patient called requesting an RX for Tramadol to be sent into [facility] which [facility] [address]  CB#[phone].  Thank you.

## 2017-12-18 NOTE — Telephone Encounter (Signed)
19This patient is approx 5 weeks post right shoulder arthroscopy.  He was denied another Rx for Percocet earlier this week by Dr. Lajoyce Cornersuda.  He is now asking for a refill on his Tramadol (last got #60 on 11/04/17, 1 q6 hrs prn).  He has his 6 weeks' postop appt with Dr. Lajoyce Cornersuda on 3/19. Could he get at least enough of the Tramadol to get him to that appt, or does he need to wait until then and discuss with Dr Lajoyce Cornersuda?  Please advise.

## 2017-12-22 ENCOUNTER — Ambulatory Visit (INDEPENDENT_AMBULATORY_CARE_PROVIDER_SITE_OTHER): Payer: Medicare HMO | Admitting: Orthopedic Surgery

## 2017-12-22 ENCOUNTER — Encounter (INDEPENDENT_AMBULATORY_CARE_PROVIDER_SITE_OTHER): Payer: Self-pay | Admitting: Orthopedic Surgery

## 2017-12-22 VITALS — Ht 64.0 in | Wt 114.0 lb

## 2017-12-22 DIAGNOSIS — M75101 Unspecified rotator cuff tear or rupture of right shoulder, not specified as traumatic: Secondary | ICD-10-CM

## 2017-12-22 MED ORDER — HYDROCODONE-ACETAMINOPHEN 5-325 MG PO TABS: 1.0000 | ORAL_TABLET | Freq: Four times a day (QID) | ORAL | 0 refills | Status: DC | PRN

## 2017-12-22 NOTE — Progress Notes (Signed)
Office Visit Note   Patient: Philip Bates           Date of Birth: 24-May-1961           MRN: [id number] Visit Date: 12/22/2017              Requested by: [place], Betty G, MD [address] [place], [place] [address] PCP: [place], Betty G, MD  Chief Complaint  Patient presents with  . Right Shoulder - Routine Post Op    11/17/17 right shoulder scope      HPI: Patient is a 57 year old gentleman who is status post right shoulder arthroscopy with complete rotator cuff tear and glenohumeral arthritis.  Patient is also status post cervical spine fusion with Dr. Bevely Palmer.  Patient states he still has complications with his neck and states he still is having shoulder pain.  Patient states that physical therapy made his shoulder worse.  He states the tramadol has not helped his  Assessment & Plan: Visit Diagnoses:  1. Tear of right rotator cuff, unspecified tear extent     Plan: With patient's complete rotator cuff tear and persistent symptoms I discussed his only surgical option would be to consider a reverse total shoulder arthroplasty.  Discussed that there is prolonged rehabilitation involved with this and that his shoulder would never be completely pain-free.  Patient states he understands will have him follow-up with Dr. August Saucer for evaluation for reverse total shoulder arthroplasty.  Follow-Up Instructions: Return if symptoms worsen or fail to improve.   Ortho Exam  Patient is alert, oriented, no adenopathy, well-dressed, normal affect, normal respiratory effort. Examination patient has abduction flexion to 70 degrees.  He has crepitation with range of motion of the shoulder he has pain with Neer Hawkins impingement test pain with a drop arm test.  Imaging: No results found. No images are attached to the encounter.  Labs: No results found for: HGBA1C, ESRSEDRATE, CRP, LABURIC, REPTSTATUS, GRAMSTAIN, CULT, LABORGA  (HGBA1)@  Body mass index is 19.57  kg/m.  Orders:  No orders of the defined types were placed in this encounter.  Meds ordered this encounter  Medications  . HYDROcodone-acetaminophen (NORCO/VICODIN) 5-325 MG tablet    Sig: Take 1 tablet by mouth every 6 (six) hours as needed for moderate pain.    Dispense:  28 tablet    Refill:  0     Procedures: No procedures performed  Clinical Data: No additional findings.  ROS:  All other systems negative, except as noted in the HPI. Review of Systems  Objective: Vital Signs: Ht  (1.626 m)   Wt 114 lb (51.7 kg)   BMI 19.57 kg/m   Specialty Comments:  No specialty comments available.  PMFS History: Patient Active Problem List   Diagnosis Date Noted  . Cervical spondylosis with radiculopathy 08/11/2017  . Chronic right shoulder pain 06/04/2017  . Radiculopathy, cervical 06/01/2017  . Tobacco use disorder 02/10/2017  . Insomnia 02/10/2017  . Cerebral palsy (HCC) 02/10/2017  . Chronic lower back pain 02/10/2017   Past Medical History:  Diagnosis Date  . Arthritis   . Cerebral palsy (HCC)   . Cervical radiculopathy   . Spondylosis of cervical spine     Family History  Problem Relation Age of Onset  . Cancer Father        esophagus  . Diabetes Brother   . Diabetes Paternal Uncle     Past Surgical History:  Procedure Laterality Date  . ANTERIOR CERVICAL DECOMPRESSION/DISCECTOMY FUSION 4  LEVELS N/A 08/11/2017   Procedure: Cervical three to Cervical seven Anterior Cervical discectomy with fusion/plate fixation;  Surgeon: Ditty, Loura Halt, MD;  Location: [facility] OR;  Service: Neurosurgery;  Laterality: N/A;  . BACK SURGERY    . HERNIA REPAIR    . LEG SURGERY    . SHOULDER SURGERY     Social History   Occupational History  . Not on file  Tobacco Use  . Smoking status: Current Every Day Smoker    Packs/day: 1.00    Types: Cigarettes  . Smokeless tobacco: Never Used  Substance and Sexual Activity  . Alcohol use: No  . Drug use: No  . Sexual  activity: Yes

## 2017-12-28 ENCOUNTER — Telehealth (INDEPENDENT_AMBULATORY_CARE_PROVIDER_SITE_OTHER): Payer: Self-pay | Admitting: Orthopedic Surgery

## 2017-12-28 MED ORDER — HYDROCODONE-ACETAMINOPHEN 5-325 MG PO TABS: 1.0000 | ORAL_TABLET | Freq: Two times a day (BID) | ORAL | 0 refills | Status: DC | PRN

## 2017-12-28 NOTE — Telephone Encounter (Signed)
Patient called asking for a refill on his hydrocodone. CB # [phone]

## 2017-12-28 NOTE — Telephone Encounter (Signed)
Ok to rf # 28 he needs rov for cuff repair vs scr pls calal thx

## 2017-12-28 NOTE — Telephone Encounter (Signed)
Ok to rf? 

## 2017-12-28 NOTE — Telephone Encounter (Signed)
IC no answer. LMVM with all details per Dr August Saucerean. Advised could put up rx at front desk.

## 2018-01-01 ENCOUNTER — Ambulatory Visit (INDEPENDENT_AMBULATORY_CARE_PROVIDER_SITE_OTHER): Payer: Medicare HMO | Admitting: Orthopedic Surgery

## 2018-01-01 ENCOUNTER — Encounter (INDEPENDENT_AMBULATORY_CARE_PROVIDER_SITE_OTHER): Payer: Self-pay | Admitting: Orthopedic Surgery

## 2018-01-01 DIAGNOSIS — M75101 Unspecified rotator cuff tear or rupture of right shoulder, not specified as traumatic: Secondary | ICD-10-CM

## 2018-01-02 ENCOUNTER — Encounter (INDEPENDENT_AMBULATORY_CARE_PROVIDER_SITE_OTHER): Payer: Self-pay | Admitting: Orthopedic Surgery

## 2018-01-02 NOTE — Progress Notes (Signed)
Office Visit Note   Patient: Philip FothergillJoseph Bates           Date of Birth: 09/03/1961           MRN: [id number] Visit Date: 01/01/2018 Requested by: [place], Betty G, MD [address] [place], [place] [address] PCP: [place], Betty G, MD  Subjective: Chief Complaint  Patient presents with  . Right Shoulder - Pain    HPI: Philip Bates is a patient with right shoulder pain.  He is 57 years old.  Had previous rotator cuff repair done in 2016.  By his own admission he was lifting boxes because he had to move right after surgery.  The [place] was [place] in [place].  He is a smoker.  He is on disability.  Reports continued pain in that right shoulder region.  Had recent right shoulder arthroscopy and debridement at which time the rotator cuff tear was felt to be unrepairable.  MRI scan from January is reviewed and it does show supraspinatus tear with retraction to the top of the humeral head.  Was not really at the glenoid yet.  He does report primarily pain as well as some weakness when he tries to lift things.  Denies any radicular symptoms.              ROS: All systems reviewed are negative as they relate to the chief complaint within the history of present illness.  Patient denies  fevers or chills.   Assessment & Plan: Visit Diagnoses:  1. Tear of right rotator cuff, unspecified tear extent     Plan: Impression is right shoulder pain from possible early rotator cuff arthropathy.  All the MRI scan that rotator cuff repair did not look to terrible but from the op note review it appeared worse.  Philip Bates has essentially 2 options at this time.  One would be an attempt at rotator cuff repair with very strict adherence to post repair protocol.  He is a smoker so his chances of having a good result are less.  Another option is rotator cuff superior capsular reconstruction.  I do think he is too young at age 57 for reverse shoulder replacement.  The risk and benefits of rotator cuff repair and superior capsular  reconstruction are discussed.  His shoulder will not be pain-free.  Patient understands the risk benefits and wishes to proceed with surgical intervention.  All questions answered.  I do not think the [facility] has any pathologic influence on his pain generation at this time.  Follow-Up Instructions: No follow-ups on file.   Orders:  No orders of the defined types were placed in this encounter.  No orders of the defined types were placed in this encounter.     Procedures: No procedures performed   Clinical Data: No additional findings.  Objective: Vital Signs: There were no vitals taken for this visit.  Physical Exam:   Constitutional: Patient appears well-developed HEENT:  Head: Normocephalic Eyes:EOM are normal Neck: Normal range of motion Cardiovascular: Normal rate Pulmonary/chest: Effort normal Neurologic: Patient is alert Skin: Skin is warm Psychiatric: Patient has normal mood and affect    Ortho Exam: Orthopedic exam demonstrates forward flexion and abduction active range of motion to be full and symmetric with the left arm.  He does have some weakness to supraspinatus testing on the right compared to the left.  Negative apprehension relocation testing.  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  All well-healed.  No real warmth to the shoulder.  There is some crepitus to range of motion in the shoulder joint.  This is with passive and active range of motion.  Specialty Comments:  No specialty comments available.  Imaging: No results found.   PMFS History: Patient Active Problem List   Diagnosis Date Noted  . Cervical spondylosis with radiculopathy 08/11/2017  . Chronic right shoulder pain 06/04/2017  . Radiculopathy, cervical 06/01/2017  . Tobacco use disorder 02/10/2017  . Insomnia 02/10/2017  . Cerebral palsy (HCC) 02/10/2017  . Chronic lower back pain 02/10/2017   Past Medical History:  Diagnosis Date  . Arthritis   . Cerebral palsy (HCC)     . Cervical radiculopathy   . Spondylosis of cervical spine     Family History  Problem Relation Age of Onset  . Cancer Father        esophagus  . Diabetes Brother   . Diabetes Paternal Uncle     Past Surgical History:  Procedure Laterality Date  . ANTERIOR CERVICAL DECOMPRESSION/DISCECTOMY FUSION 4 LEVELS N/A 08/11/2017   Procedure: Cervical three to Cervical seven Anterior Cervical discectomy with fusion/plate fixation;  Surgeon: Ditty, Loura Halt, MD;  Location: [facility];  Service: Neurosurgery;  Laterality: N/A;  . BACK SURGERY    . HERNIA REPAIR    . LEG SURGERY    . SHOULDER SURGERY     Social History   Occupational History  . Not on file  Tobacco Use  . Smoking status: Current Every Day Smoker    Packs/day: 1.00    Types: Cigarettes  . Smokeless tobacco: Never Used  Substance and Sexual Activity  . Alcohol use: No  . Drug use: No  . Sexual activity: Yes

## 2018-01-04 ENCOUNTER — Telehealth (INDEPENDENT_AMBULATORY_CARE_PROVIDER_SITE_OTHER): Payer: Self-pay | Admitting: Orthopedic Surgery

## 2018-01-04 MED ORDER — GABAPENTIN 300 MG PO CAPS: 300.0000 mg | ORAL_CAPSULE | Freq: Two times a day (BID) | ORAL | 0 refills | Status: DC

## 2018-01-04 MED ORDER — TRAMADOL HCL 50 MG PO TABS: 50.0000 mg | ORAL_TABLET | Freq: Two times a day (BID) | ORAL | 0 refills | Status: DC | PRN

## 2018-01-04 NOTE — Telephone Encounter (Signed)
Ok to rf? 

## 2018-01-04 NOTE — Telephone Encounter (Signed)
Patient called in for refill of gabapentin (NEURONTIN) 300 MG capsule &traMADol (ULTRAM) 50 MG tablet [[id number]][[id number]]   Please call patient to advise

## 2018-01-04 NOTE — Telephone Encounter (Signed)
Called in to pharmacy.  I left voicemail for patient advising.

## 2018-01-04 NOTE — Telephone Encounter (Signed)
Please advise 

## 2018-01-11 ENCOUNTER — Telehealth (INDEPENDENT_AMBULATORY_CARE_PROVIDER_SITE_OTHER): Payer: Self-pay | Admitting: Orthopedic Surgery

## 2018-01-11 NOTE — Telephone Encounter (Signed)
Ok 1 po q 12 # 30

## 2018-01-11 NOTE — Telephone Encounter (Signed)
Patient called requesting an RX refill on his Hydrocodone.  CB#[phone].  Thank you.

## 2018-01-11 NOTE — Telephone Encounter (Signed)
Ok to rf? 

## 2018-01-12 ENCOUNTER — Telehealth (INDEPENDENT_AMBULATORY_CARE_PROVIDER_SITE_OTHER): Payer: Self-pay | Admitting: Orthopedic Surgery

## 2018-01-12 MED ORDER — HYDROCODONE-ACETAMINOPHEN 5-325 MG PO TABS: 1.0000 | ORAL_TABLET | Freq: Two times a day (BID) | ORAL | 0 refills | Status: DC | PRN

## 2018-01-12 NOTE — Telephone Encounter (Signed)
Patient called concerning Rx refill (Hydrocodone) Patient   asked to be called when Rx is at the front desk. The number to contact patient is [phone]

## 2018-01-12 NOTE — Addendum Note (Signed)
Addended byPrescott Parma: Cesario Weidinger on: 01/12/2018 11:24 AM   Modules accepted: Orders

## 2018-01-12 NOTE — Telephone Encounter (Signed)
rx printed. IC LM for patient advised that he could pick up tomorrow morning-Dr August Saucerean will not be here to sign before that time.

## 2018-01-12 NOTE — Telephone Encounter (Signed)
See other note

## 2018-01-20 ENCOUNTER — Telehealth: Payer: Self-pay | Admitting: Family Medicine

## 2018-01-20 NOTE — Telephone Encounter (Signed)
Message sent to Dr. Jordan for review and approval. 

## 2018-01-20 NOTE — Telephone Encounter (Signed)
Patient needs a refill on his   diazepam (VALIUM) 5 MG tablet    [facility] to:  [id number] [place] [place] - [address] [phone] (Phone) [phone] (Fax)

## 2018-01-24 ENCOUNTER — Other Ambulatory Visit: Payer: Self-pay | Admitting: Family Medicine

## 2018-01-24 DIAGNOSIS — G809 Cerebral palsy, unspecified: Secondary | ICD-10-CM

## 2018-01-24 DIAGNOSIS — G4700 Insomnia, unspecified: Secondary | ICD-10-CM

## 2018-01-24 MED ORDER — DIAZEPAM 5 MG PO TABS: 5.0000 mg | ORAL_TABLET | Freq: Every day | ORAL | 0 refills | Status: DC | PRN

## 2018-01-24 NOTE — Telephone Encounter (Signed)
Rx for Diazepam sent. He was supposed to have 4 weeks follow up arranged. Needs f/u appt before more refills are approved.  Thanks, BJ

## 2018-01-25 NOTE — Telephone Encounter (Signed)
Patient informed that Rx was sent to pharmacy. Pt verbalized understanding. Patient has appointment scheduled for May 2019.

## 2018-01-25 NOTE — Pre-Procedure Instructions (Signed)
Philip FothergillJoseph Bates  01/25/2018      [id number] [place], [place] - [address] [place] [address] Phone: [phone] Fax: [phone]  [facility] [address] [place], [place] - [address] [place] [address] Phone: [phone] Fax: [phone]    Your procedure is scheduled on April 30  [facility]  Call this number if you have problems the morning of surgery:  [phone]   Remember:  Do not eat food or drink liquids after midnight.  Continue all medications as directed by your physician except follow these medication instructions before surgery below   Take these medicines the morning of surgery with A SIP OF WATER  diazepam (VALIUM gabapentin (NEURONTIN)  HYDROcodone-acetaminophen (NORCO/VICODIN)  7 days prior to surgery STOP taking any Aspirin(unless otherwise instructed by your surgeon), Aleve, Naproxen, Ibuprofen, Motrin, Advil, Goody's, BC's, all herbal medications, fish oil, and all vitamins    Do not wear jewelry  Do not wear lotions, powders, or cologne, or deodorant.  Men may shave face and neck.  Do not bring valuables to the hospital.  [facility] is not responsible for any belongings or valuables.  Contacts, dentures or bridgework may not be worn into surgery.  Leave your suitcase in the car.  After surgery it may be brought to your room.  For patients admitted to the hospital, discharge time will be determined by your treatment team.  Patients discharged the day of surgery will not be allowed to drive home.    Special instructions:   [place]- Preparing For Surgery  Before surgery, you can play an important role. Because skin is not sterile, your skin needs to be as free of germs as possible. You can reduce the number of germs on your skin by washing with CHG (chlorahexidine gluconate) Soap before surgery.  CHG is  an antiseptic cleaner which kills germs and bonds with the skin to continue killing germs even after washing.  Please do not use if you have an allergy to CHG or antibacterial soaps. If your skin becomes reddened/irritated stop using the CHG.  Do not shave (including legs and underarms) for at least 48 hours prior to first CHG shower. It is OK to shave your face.  Please follow these instructions carefully.   1. Shower the NIGHT BEFORE SURGERY and the MORNING OF SURGERY with CHG.   2. If you chose to wash your hair, wash your hair first as usual with your normal shampoo.  3. After you shampoo, rinse your hair and body thoroughly to remove the shampoo.  4. Use CHG as you would any other liquid soap. You can apply CHG directly to the skin and wash gently with a scrungie or a clean washcloth.   5. Apply the CHG Soap to your body ONLY FROM THE NECK DOWN.  Do not use on open wounds or open sores. Avoid contact with your eyes, ears, mouth and genitals (private parts). Wash Face and genitals (private parts)  with your normal soap.  6. Wash thoroughly, paying special attention to the area where your surgery will be performed.  7. Thoroughly rinse your body with warm water from the neck down.  8. DO NOT shower/wash with your normal soap after using and rinsing off the CHG Soap.  9. Pat yourself dry with a CLEAN TOWEL.  10. Wear CLEAN PAJAMAS to bed the night before surgery, wear  comfortable clothes the morning of surgery  11. Place CLEAN SHEETS on your bed the night of your first shower and DO NOT SLEEP WITH PETS.    Day of Surgery: Do not apply any deodorants/lotions. Please wear clean clothes to the hospital/surgery center.      Please read over the following fact sheets that you were given.

## 2018-01-26 ENCOUNTER — Encounter (HOSPITAL_COMMUNITY): Payer: Self-pay

## 2018-01-26 ENCOUNTER — Other Ambulatory Visit (INDEPENDENT_AMBULATORY_CARE_PROVIDER_SITE_OTHER): Payer: Self-pay | Admitting: Orthopedic Surgery

## 2018-01-26 ENCOUNTER — Encounter (HOSPITAL_COMMUNITY)
Admission: RE | Admit: 2018-01-26 | Discharge: 2018-01-26 | Disposition: A | Discharge status: Home / Self Care | Payer: Medicare HMO | Source: Ambulatory Visit | Attending: Orthopedic Surgery | Admitting: Orthopedic Surgery

## 2018-01-26 ENCOUNTER — Other Ambulatory Visit: Payer: Self-pay

## 2018-01-26 DIAGNOSIS — Z01812 Encounter for preprocedural laboratory examination: Secondary | ICD-10-CM | POA: Diagnosis not present

## 2018-01-26 DIAGNOSIS — M75101 Unspecified rotator cuff tear or rupture of right shoulder, not specified as traumatic: Secondary | ICD-10-CM

## 2018-01-26 LAB — CBC
HCT: 40.4 % (ref 39.0–52.0)
Hemoglobin: 14 g/dL (ref 13.0–17.0)
MCH: 27.2 pg (ref 26.0–34.0)
MCHC: 34.7 g/dL (ref 30.0–36.0)
MCV: 78.4 fL (ref 78.0–100.0)
PLATELETS: 211 10*3/uL (ref 150–400)
RBC: 5.15 MIL/uL (ref 4.22–5.81)
RDW: 13.8 % (ref 11.5–15.5)
WBC: 4.6 10*3/uL (ref 4.0–10.5)

## 2018-01-26 LAB — BASIC METABOLIC PANEL
Anion gap: 8 (ref 5–15)
BUN: 9 mg/dL (ref 6–20)
CHLORIDE: 106 mmol/L (ref 101–111)
CO2: 26 mmol/L (ref 22–32)
CREATININE: 0.71 mg/dL (ref 0.61–1.24)
Calcium: 9.3 mg/dL (ref 8.9–10.3)
GFR calc Af Amer: >60 mL/min (ref >60)
GLUCOSE: 96 mg/dL (ref 65–99)
POTASSIUM: 3.9 mmol/L (ref 3.5–5.1)
Sodium: 140 mmol/L (ref 135–145)

## 2018-01-26 NOTE — Progress Notes (Signed)
PCP - Dr. Betty SwazilandJordan Cardiologist - patient denies  Chest x-ray - 10/15/2017 EKG - 10/24/2017 Stress Test - patient denies ECHO - patient denies Cardiac Cath - patient denies  Sleep Study - patient denies   Blood Thinner Instructions:n/a Aspirin Instructions: n/a  Anesthesia review: n/a  Patient denies shortness of breath, fever, cough and chest pain at PAT appointment   Patient verbalized understanding of instructions that were given to them at the PAT appointment. Patient was also instructed that they will need to review over the PAT instructions again at home before surgery.

## 2018-01-31 ENCOUNTER — Other Ambulatory Visit (INDEPENDENT_AMBULATORY_CARE_PROVIDER_SITE_OTHER): Payer: Self-pay | Admitting: Orthopedic Surgery

## 2018-02-01 ENCOUNTER — Inpatient Hospital Stay (INDEPENDENT_AMBULATORY_CARE_PROVIDER_SITE_OTHER): Payer: Medicare HMO | Admitting: Orthopaedic Surgery

## 2018-02-01 NOTE — Anesthesia Preprocedure Evaluation (Addendum)
Anesthesia Evaluation  Patient identified by MRN, date of birth, ID band Patient awake    Reviewed: Allergy & Precautions, NPO status , Patient's Chart, lab work & pertinent test results  History of Anesthesia Complications Negative for: history of anesthetic complications  Airway Mallampati: II  TM Distance: >3 FB Neck ROM: Full    Dental  (+) Dental Advisory Given, Poor Dentition, Chipped, Missing,    Pulmonary Current Smoker,    Pulmonary exam normal breath sounds clear to auscultation       Cardiovascular Exercise Tolerance: Good negative cardio ROS Normal cardiovascular exam Rhythm:Regular Rate:Normal     Neuro/Psych Cerebral palsy--left knee Cervical radiculopathy  Neuromuscular disease negative psych ROS   GI/Hepatic negative GI ROS, Neg liver ROS,   Endo/Other  negative endocrine ROS  Renal/GU negative Renal ROS  negative genitourinary   Musculoskeletal  (+) Arthritis , Osteoarthritis,    Abdominal   Peds Cerebral palsy   Hematology negative hematology ROS (+)   Anesthesia Other Findings   Reproductive/Obstetrics negative OB ROS                           Anesthesia Physical  Anesthesia Plan  ASA: II  Anesthesia Plan: General   Post-op Pain Management:  Regional for Post-op pain   Induction: Intravenous  PONV Risk Score and Plan: 1 and Ondansetron, Dexamethasone and Treatment may vary due to age or medical condition  Airway Management Planned: Oral ETT and Video Laryngoscope Planned  Additional Equipment:   Intra-op Plan:   Post-operative Plan: Extubation in OR  Informed Consent: I have reviewed the patients History and Physical, chart, labs and discussed the procedure including the risks, benefits and alternatives for the proposed anesthesia with the patient or authorized representative who has indicated his/her understanding and acceptance.   Dental advisory  given  Plan Discussed with: CRNA, Surgeon and Anesthesiologist  Anesthesia Plan Comments: (Plan routine monitors, GETA with VideoGlide intubation)      Anesthesia Quick Evaluation

## 2018-02-01 NOTE — H&P (Signed)
Philip Bates is an 57 y.o. male.   Chief Complaint: Right shoulder pain HPI: Philip Bates is a 57 year old patient with right shoulder pain.  Had prior decompression surgery and rotator cuff tear at that time was felt to be unrepairable.  He tried rehab but he reports continued pain and functional limitations.  Reports weakness as well as inability to do some of his activities of daily living.  MRI scan prior to the last surgery demonstrated rotator cuff tear with retraction to the top of the humeral head.  Patient is a smoker.  Is is explained to him that smoking will definitely adversely affect the healing potential for any procedure that we do  Past Medical History:  Diagnosis Date  . Arthritis   . Cerebral palsy (HCC)   . Cervical radiculopathy   . Spondylosis of cervical spine     Past Surgical History:  Procedure Laterality Date  . ANTERIOR CERVICAL DECOMPRESSION/DISCECTOMY FUSION 4 LEVELS N/A 08/11/2017   Procedure: Cervical three to Cervical seven Anterior Cervical discectomy with fusion/plate fixation;  Surgeon: Ditty, Loura Halt, MD;  Location: [facility] OR;  Service: Neurosurgery;  Laterality: N/A;  . BACK SURGERY    . HERNIA REPAIR    . LEG SURGERY    . SHOULDER SURGERY      Family History  Problem Relation Age of Onset  . Cancer Father        esophagus  . Diabetes Brother   . Diabetes Paternal Uncle    Social History:  reports that he has been smoking cigarettes.  He has been smoking about 1.00 pack per day. He has never used smokeless tobacco. He reports that he does not drink alcohol or use drugs.  Allergies:  Allergies  Allergen Reactions  . Naproxen Other (See Comments)    In high stomach  Irritation   . Flexeril [Cyclobenzaprine] Nausea Only    No medications prior to admission.    No results found for this or any previous visit (from the past 48 hour(s)). No results found.  Review of Systems  Musculoskeletal: Positive for joint pain.  All other systems reviewed  and are negative.   There were no vitals taken for this visit. Physical Exam  Constitutional: He appears well-developed.  HENT:  Head: Normocephalic.  Eyes: Pupils are equal, round, and reactive to light.  Neck: Normal range of motion.  Cardiovascular: Normal rate.  Neurological: He is alert.  Skin: Skin is warm.  Psychiatric: He has a normal mood and affect.  Patient has full active and passive range of motion of the right shoulder compared to the left.  Rotator cuff weakness is present to supraspinatus testing and to a lesser degree infraspinatus testing.  No Popeye deformity noted in the right arm.  No other masses lymphadenopathy or skin changes noted in that right shoulder girdle region.  There is some coarse grinding and crepitus with internal and external rotation of the arm at 90 degrees of abduction.  Assessment/Plan Impression is right shoulder rotator cuff tear.  This may or may not be fixable.  In this age patient I would like to at least try to fix the rotator cuff tear.  If it is not fixable then we can proceed with the salvage procedure of superior capsular reconstruction.  Patient understands the risk and benefits associated with that procedure.  He also understands the potential for healing and no healing of the rotator cuff tear repair.  He is on the young side for reverse shoulder replacement.  All questions answered.  Burnard Bunting, MD 02/01/2018, 5:06 PM

## 2018-02-01 NOTE — Telephone Encounter (Signed)
Ok to rf? 

## 2018-02-01 NOTE — Telephone Encounter (Signed)
y

## 2018-02-02 ENCOUNTER — Encounter (HOSPITAL_COMMUNITY): Payer: Self-pay | Admitting: *Deleted

## 2018-02-02 ENCOUNTER — Ambulatory Visit (HOSPITAL_COMMUNITY)
Admission: RE | Admit: 2018-02-02 | Discharge: 2018-02-02 | Disposition: A | Discharge status: Home / Self Care | Payer: Medicare HMO | Source: Ambulatory Visit | Attending: Orthopedic Surgery | Admitting: Orthopedic Surgery

## 2018-02-02 ENCOUNTER — Encounter (INDEPENDENT_AMBULATORY_CARE_PROVIDER_SITE_OTHER): Payer: Self-pay | Admitting: Orthopedic Surgery

## 2018-02-02 ENCOUNTER — Ambulatory Visit (HOSPITAL_COMMUNITY): Payer: Medicare HMO | Admitting: Anesthesiology

## 2018-02-02 ENCOUNTER — Encounter (HOSPITAL_COMMUNITY)
Admission: RE | Disposition: A | Discharge status: Home / Self Care | Payer: Self-pay | Source: Ambulatory Visit | Attending: Orthopedic Surgery

## 2018-02-02 DIAGNOSIS — M75121 Complete rotator cuff tear or rupture of right shoulder, not specified as traumatic: Secondary | ICD-10-CM | POA: Insufficient documentation

## 2018-02-02 DIAGNOSIS — Z833 Family history of diabetes mellitus: Secondary | ICD-10-CM | POA: Insufficient documentation

## 2018-02-02 DIAGNOSIS — M479 Spondylosis, unspecified: Secondary | ICD-10-CM | POA: Insufficient documentation

## 2018-02-02 DIAGNOSIS — M65811 Other synovitis and tenosynovitis, right shoulder: Secondary | ICD-10-CM | POA: Insufficient documentation

## 2018-02-02 DIAGNOSIS — M1990 Unspecified osteoarthritis, unspecified site: Secondary | ICD-10-CM | POA: Diagnosis not present

## 2018-02-02 DIAGNOSIS — M75101 Unspecified rotator cuff tear or rupture of right shoulder, not specified as traumatic: Secondary | ICD-10-CM | POA: Diagnosis present

## 2018-02-02 DIAGNOSIS — G809 Cerebral palsy, unspecified: Secondary | ICD-10-CM | POA: Insufficient documentation

## 2018-02-02 DIAGNOSIS — Z981 Arthrodesis status: Secondary | ICD-10-CM | POA: Insufficient documentation

## 2018-02-02 DIAGNOSIS — Z800 Family history of malignant neoplasm of digestive organs: Secondary | ICD-10-CM | POA: Insufficient documentation

## 2018-02-02 DIAGNOSIS — M5412 Radiculopathy, cervical region: Secondary | ICD-10-CM | POA: Diagnosis not present

## 2018-02-02 DIAGNOSIS — F17210 Nicotine dependence, cigarettes, uncomplicated: Secondary | ICD-10-CM | POA: Diagnosis not present

## 2018-02-02 HISTORY — PX: SHOULDER ARTHROSCOPY WITH ROTATOR CUFF REPAIR AND SUBACROMIAL DECOMPRESSION: SHX5686

## 2018-02-02 SURGERY — SHOULDER ARTHROSCOPY WITH ROTATOR CUFF REPAIR AND SUBACROMIAL DECOMPRESSION
Anesthesia: General | Site: Shoulder | Laterality: Right

## 2018-02-02 MED ORDER — ACETAMINOPHEN 160 MG/5ML PO SOLN: 325.0000 mg | ORAL | Status: DC | PRN

## 2018-02-02 MED ORDER — EPINEPHRINE PF 1 MG/ML IJ SOLN
INTRAMUSCULAR | Status: DC | PRN
  Administered 2018-02-02: .1 mg

## 2018-02-02 MED ORDER — VANCOMYCIN HCL 500 MG IV SOLR
INTRAVENOUS | Status: DC | PRN
  Administered 2018-02-02: 500 mg via TOPICAL

## 2018-02-02 MED ORDER — SUGAMMADEX SODIUM 200 MG/2ML IV SOLN
INTRAVENOUS | Status: DC | PRN
  Administered 2018-02-02: 105.2 mg via INTRAVENOUS

## 2018-02-02 MED ORDER — BUPIVACAINE HCL (PF) 0.25 % IJ SOLN
INTRAMUSCULAR | Status: AC
  Filled 2018-02-02: qty 30

## 2018-02-02 MED ORDER — 0.9 % SODIUM CHLORIDE (POUR BTL) OPTIME
TOPICAL | Status: DC | PRN
  Administered 2018-02-02 (×2): 1000 mL

## 2018-02-02 MED ORDER — CEFAZOLIN SODIUM-DEXTROSE 2-4 GM/100ML-% IV SOLN
2.0000 g | INTRAVENOUS | Status: AC
  Administered 2018-02-02: 2 g via INTRAVENOUS
  Filled 2018-02-02: qty 100

## 2018-02-02 MED ORDER — ROPIVACAINE HCL 7.5 MG/ML IJ SOLN
INTRAMUSCULAR | Status: DC | PRN
  Administered 2018-02-02: 20 mL via PERINEURAL

## 2018-02-02 MED ORDER — VANCOMYCIN HCL 500 MG IV SOLR
INTRAVENOUS | Status: AC
  Filled 2018-02-02: qty 500

## 2018-02-02 MED ORDER — PHENYLEPHRINE HCL 10 MG/ML IJ SOLN
INTRAVENOUS | Status: DC | PRN
  Administered 2018-02-02: 25 ug/min via INTRAVENOUS
  Administered 2018-02-02: 75 ug/min via INTRAVENOUS

## 2018-02-02 MED ORDER — PROPOFOL 10 MG/ML IV BOLUS
INTRAVENOUS | Status: DC | PRN
  Administered 2018-02-02: 100 mg via INTRAVENOUS
  Administered 2018-02-02 (×3): 20 mg via INTRAVENOUS

## 2018-02-02 MED ORDER — LACTATED RINGERS IV SOLN
INTRAVENOUS | Status: DC
  Administered 2018-02-02 (×2): via INTRAVENOUS

## 2018-02-02 MED ORDER — PROPOFOL 10 MG/ML IV BOLUS
INTRAVENOUS | Status: AC
  Filled 2018-02-02: qty 20

## 2018-02-02 MED ORDER — FENTANYL CITRATE (PF) 100 MCG/2ML IJ SOLN
INTRAMUSCULAR | Status: DC | PRN
  Administered 2018-02-02 (×3): 50 ug via INTRAVENOUS

## 2018-02-02 MED ORDER — ONDANSETRON HCL 4 MG/2ML IJ SOLN
INTRAMUSCULAR | Status: DC | PRN
  Administered 2018-02-02: 4 mg via INTRAVENOUS

## 2018-02-02 MED ORDER — SODIUM CHLORIDE 0.9 % IR SOLN
Status: DC | PRN
  Administered 2018-02-02 (×2): 3000 mL

## 2018-02-02 MED ORDER — ONDANSETRON HCL 4 MG/2ML IJ SOLN
INTRAMUSCULAR | Status: AC
  Filled 2018-02-02: qty 2

## 2018-02-02 MED ORDER — LIDOCAINE HCL (CARDIAC) PF 100 MG/5ML IV SOSY
PREFILLED_SYRINGE | INTRAVENOUS | Status: DC | PRN
  Administered 2018-02-02: 100 mg via INTRAVENOUS

## 2018-02-02 MED ORDER — FENTANYL CITRATE (PF) 100 MCG/2ML IJ SOLN: 25.0000 ug | INTRAMUSCULAR | Status: DC | PRN

## 2018-02-02 MED ORDER — STERILE WATER FOR IRRIGATION IR SOLN
Status: DC | PRN
  Administered 2018-02-02: 10 mL

## 2018-02-02 MED ORDER — SODIUM CHLORIDE 0.9 % IJ SOLN
INTRAMUSCULAR | Status: DC | PRN
  Administered 2018-02-02: 50 mL

## 2018-02-02 MED ORDER — FENTANYL CITRATE (PF) 100 MCG/2ML IJ SOLN
INTRAMUSCULAR | Status: AC
  Filled 2018-02-02: qty 2

## 2018-02-02 MED ORDER — DEXAMETHASONE SODIUM PHOSPHATE 10 MG/ML IJ SOLN
INTRAMUSCULAR | Status: DC | PRN
  Administered 2018-02-02: 10 mg via INTRAVENOUS

## 2018-02-02 MED ORDER — MIDAZOLAM HCL 2 MG/2ML IJ SOLN
INTRAMUSCULAR | Status: AC
  Filled 2018-02-02: qty 2

## 2018-02-02 MED ORDER — EPINEPHRINE PF 1 MG/ML IJ SOLN
INTRAMUSCULAR | Status: AC
  Filled 2018-02-02: qty 1

## 2018-02-02 MED ORDER — ROCURONIUM BROMIDE 100 MG/10ML IV SOLN
INTRAVENOUS | Status: DC | PRN
  Administered 2018-02-02: 40 mg via INTRAVENOUS

## 2018-02-02 MED ORDER — SUGAMMADEX SODIUM 200 MG/2ML IV SOLN
INTRAVENOUS | Status: AC
  Filled 2018-02-02: qty 2

## 2018-02-02 MED ORDER — ONDANSETRON HCL 4 MG/2ML IJ SOLN
4.0000 mg | Freq: Once | INTRAMUSCULAR | Status: DC | PRN
Start: 2018-02-02 — End: 2018-02-02

## 2018-02-02 MED ORDER — FENTANYL CITRATE (PF) 250 MCG/5ML IJ SOLN
INTRAMUSCULAR | Status: AC
  Filled 2018-02-02: qty 5

## 2018-02-02 MED ORDER — CHLORHEXIDINE GLUCONATE 4 % EX LIQD: 60.0000 mL | Freq: Once | CUTANEOUS | Status: DC

## 2018-02-02 MED ORDER — ONDANSETRON HCL 4 MG/2ML IJ SOLN: 4.0000 mg | Freq: Once | INTRAMUSCULAR | Status: DC | PRN

## 2018-02-02 MED ORDER — OXYCODONE HCL 5 MG/5ML PO SOLN: 5.0000 mg | Freq: Once | ORAL | Status: DC | PRN

## 2018-02-02 MED ORDER — MEPERIDINE HCL 50 MG/ML IJ SOLN
INTRAMUSCULAR | Status: AC
  Administered 2018-02-02: 12.5 mg via INTRAVENOUS
  Filled 2018-02-02: qty 1

## 2018-02-02 MED ORDER — MIDAZOLAM HCL 2 MG/2ML IJ SOLN
2.0000 mg | Freq: Once | INTRAMUSCULAR | Status: AC
  Administered 2018-02-02: 2 mg via INTRAVENOUS

## 2018-02-02 MED ORDER — PHENYLEPHRINE HCL 10 MG/ML IJ SOLN
INTRAMUSCULAR | Status: DC | PRN
  Administered 2018-02-02 (×4): 80 ug via INTRAVENOUS

## 2018-02-02 MED ORDER — OXYCODONE HCL 5 MG PO TABS: 5.0000 mg | ORAL_TABLET | Freq: Once | ORAL | Status: DC | PRN

## 2018-02-02 MED ORDER — LIDOCAINE 2% (20 MG/ML) 5 ML SYRINGE
INTRAMUSCULAR | Status: AC
  Filled 2018-02-02: qty 5

## 2018-02-02 MED ORDER — FENTANYL CITRATE (PF) 100 MCG/2ML IJ SOLN
50.0000 ug | Freq: Once | INTRAMUSCULAR | Status: AC
  Administered 2018-02-02: 50 ug via INTRAVENOUS

## 2018-02-02 MED ORDER — ACETAMINOPHEN 325 MG PO TABS: 325.0000 mg | ORAL_TABLET | ORAL | Status: DC | PRN

## 2018-02-02 MED ORDER — DEXAMETHASONE SODIUM PHOSPHATE 10 MG/ML IJ SOLN
INTRAMUSCULAR | Status: AC
  Filled 2018-02-02: qty 1

## 2018-02-02 MED ORDER — MEPERIDINE HCL 50 MG/ML IJ SOLN
6.2500 mg | INTRAMUSCULAR | Status: DC | PRN
  Administered 2018-02-02: 12.5 mg via INTRAVENOUS

## 2018-02-02 SURGICAL SUPPLY — 79 items
ALCOHOL 70% 16 OZ (MISCELLANEOUS) ×3 IMPLANT
ANCHOR SUT BIOCOMP CORKSREW (Anchor) ×3 IMPLANT
BLADE CUTTER GATOR 3.5 (BLADE) ×3 IMPLANT
BLADE GREAT WHITE 4.2 (BLADE) IMPLANT
BLADE GREAT WHITE 4.2MM (BLADE)
BLADE SURG 11 STRL SS (BLADE) ×3 IMPLANT
BUR OVAL 6.0 (BURR) IMPLANT
CLOSURE STERI-STRIP 1/2X4 (GAUZE/BANDAGES/DRESSINGS) ×1
CLOSURE WOUND 1/2 X4 (GAUZE/BANDAGES/DRESSINGS) ×1
CLSR STERI-STRIP ANTIMIC 1/2X4 (GAUZE/BANDAGES/DRESSINGS) ×2 IMPLANT
COVER SURGICAL LIGHT HANDLE (MISCELLANEOUS) ×3 IMPLANT
DRAPE INCISE IOBAN 66X45 STRL (DRAPES) ×6 IMPLANT
DRAPE STERI 35X30 U-POUCH (DRAPES) ×3 IMPLANT
DRAPE U-SHAPE 47X51 STRL (DRAPES) ×6 IMPLANT
DRSG TEGADERM 4X4.75 (GAUZE/BANDAGES/DRESSINGS) ×3 IMPLANT
DURAPREP 26ML APPLICATOR (WOUND CARE) ×6 IMPLANT
ELECT REM PT RETURN 9FT ADLT (ELECTROSURGICAL) ×3
ELECTRODE REM PT RTRN 9FT ADLT (ELECTROSURGICAL) ×1 IMPLANT
FIBERLOOP 2 0 (SUTURE) ×3 IMPLANT
FILTER STRAW FLUID ASPIR (MISCELLANEOUS) ×3 IMPLANT
GAUZE SPONGE 4X4 12PLY STRL (GAUZE/BANDAGES/DRESSINGS) ×3 IMPLANT
GAUZE XEROFORM 1X8 LF (GAUZE/BANDAGES/DRESSINGS) ×3 IMPLANT
GLOVE BIOGEL PI IND STRL 7.5 (GLOVE) ×1 IMPLANT
GLOVE BIOGEL PI IND STRL 8 (GLOVE) ×1 IMPLANT
GLOVE BIOGEL PI INDICATOR 7.5 (GLOVE) ×2
GLOVE BIOGEL PI INDICATOR 8 (GLOVE) ×2
GLOVE ECLIPSE 7.0 STRL STRAW (GLOVE) ×3 IMPLANT
GLOVE SURG ORTHO 8.0 STRL STRW (GLOVE) ×3 IMPLANT
GOWN STRL REUS W/ TWL LRG LVL3 (GOWN DISPOSABLE) ×3 IMPLANT
GOWN STRL REUS W/TWL LRG LVL3 (GOWN DISPOSABLE) ×6
HYDROGEN PEROXIDE 16OZ (MISCELLANEOUS) ×3 IMPLANT
KIT BASIN OR (CUSTOM PROCEDURE TRAY) ×3 IMPLANT
KIT TURNOVER KIT B (KITS) ×3 IMPLANT
MANIFOLD NEPTUNE II (INSTRUMENTS) ×3 IMPLANT
NDL SUT 6 .5 CRC .975X.05 MAYO (NEEDLE) ×1 IMPLANT
NEEDLE HYPO 25X1 1.5 SAFETY (NEEDLE) ×3 IMPLANT
NEEDLE MAYO TAPER (NEEDLE) ×2
NEEDLE SCORPION MULTI FIRE (NEEDLE) ×3 IMPLANT
NEEDLE SPNL 18GX3.5 QUINCKE PK (NEEDLE) ×3 IMPLANT
NEEDLE SUT 2-0 SCORPION KNEE (NEEDLE) ×3 IMPLANT
NS IRRIG 1000ML POUR BTL (IV SOLUTION) ×3 IMPLANT
PACK SHOULDER (CUSTOM PROCEDURE TRAY) ×3 IMPLANT
PAD ARMBOARD 7.5X6 YLW CONV (MISCELLANEOUS) ×6 IMPLANT
PUSHLOCK PEEK 4.5X24 (Orthopedic Implant) ×6 IMPLANT
RESTRAINT HEAD UNIVERSAL NS (MISCELLANEOUS) ×3 IMPLANT
SET ARTHROSCOPY TUBING (MISCELLANEOUS) ×2
SET ARTHROSCOPY TUBING LN (MISCELLANEOUS) ×1 IMPLANT
SLING ARM IMMOBILIZER LRG (SOFTGOODS) IMPLANT
SOL PREP POV-IOD 4OZ 10% (MISCELLANEOUS) ×3 IMPLANT
SOLUTION BETADINE 4OZ (MISCELLANEOUS) ×3 IMPLANT
SPONGE LAP 4X18 X RAY DECT (DISPOSABLE) ×3 IMPLANT
STRIP CLOSURE SKIN 1/2X4 (GAUZE/BANDAGES/DRESSINGS) ×2 IMPLANT
SUCTION FRAZIER HANDLE 10FR (MISCELLANEOUS) ×2
SUCTION TUBE FRAZIER 10FR DISP (MISCELLANEOUS) ×1 IMPLANT
SUT 0 FIBERLOOP 38 BLUE TPR ND (SUTURE) ×9
SUT 2 FIBERLOOP 20 STRT BLUE (SUTURE)
SUT ETHILON 3 0 PS 1 (SUTURE) ×6 IMPLANT
SUT FIBERWIRE #2 38 T-5 BLUE (SUTURE)
SUT FIBERWIRE 2-0 18 17.9 3/8 (SUTURE) ×18
SUT MNCRL AB 3-0 PS2 18 (SUTURE) ×3 IMPLANT
SUT VIC AB 0 CT1 27 (SUTURE) ×4
SUT VIC AB 0 CT1 27XBRD ANBCTR (SUTURE) ×2 IMPLANT
SUT VIC AB 1 CT1 27 (SUTURE) ×2
SUT VIC AB 1 CT1 27XBRD ANBCTR (SUTURE) ×1 IMPLANT
SUT VIC AB 2-0 CT1 27 (SUTURE) ×2
SUT VIC AB 2-0 CT1 TAPERPNT 27 (SUTURE) ×1 IMPLANT
SUT VICRYL 0 UR6 27IN ABS (SUTURE) ×3 IMPLANT
SUTURE 0 FIBERLP 38 BLU TPR ND (SUTURE) ×3 IMPLANT
SUTURE 2 FIBERLOOP 20 STRT BLU (SUTURE) IMPLANT
SUTURE FIBERWR #2 38 T-5 BLUE (SUTURE) IMPLANT
SUTURE FIBERWR 2-0 18 17.9 3/8 (SUTURE) ×6 IMPLANT
SYR 20CC LL (SYRINGE) ×6 IMPLANT
SYR 30ML LL (SYRINGE) ×3 IMPLANT
SYR TB 1ML LUER SLIP (SYRINGE) ×3 IMPLANT
TOWEL OR 17X24 6PK STRL BLUE (TOWEL DISPOSABLE) ×3 IMPLANT
TOWEL OR 17X26 10 PK STRL BLUE (TOWEL DISPOSABLE) ×3 IMPLANT
WAND HAND CNTRL MULTIVAC 90 (MISCELLANEOUS) IMPLANT
WAND STAR VAC 90 (SURGICAL WAND) ×3 IMPLANT
WATER STERILE IRR 1000ML POUR (IV SOLUTION) ×3 IMPLANT

## 2018-02-02 NOTE — Transfer of Care (Signed)
Immediate Anesthesia Transfer of Care Note  Patient: Philip Bates  Procedure(s) Performed: RIGHT SHOULDER ARTHROSCOPY WITH SUBACROMIAL DECOMPRESSION, MINI-OPEN ROTATOR CUFF REPAIR (Right Shoulder)  Patient Location: PACU  Anesthesia Type:General  Level of Consciousness: awake  Airway & Oxygen Therapy: Patient Spontanous Breathing  Post-op Assessment: Report given to RN and Post -op Vital signs reviewed and stable  Post vital signs: Reviewed and stable  Last Vitals:  Vitals Value Taken Time  BP    Temp    Pulse 78 02/02/2018  4:40 PM  Resp 22 02/02/2018  4:40 PM  SpO2 94 % 02/02/2018  4:40 PM  Vitals shown include unvalidated device data.  Last Pain:  Vitals:   02/02/18 1220  TempSrc:   PainSc: 2       Patients Stated Pain Goal: 3 (02/02/18 1220)  Complications: No apparent anesthesia complications

## 2018-02-02 NOTE — Anesthesia Procedure Notes (Signed)
Anesthesia Regional Block: Interscalene brachial plexus block   Pre-Anesthetic Checklist: ,, timeout performed, Correct Patient, Correct Site, Correct Laterality, Correct Procedure, Correct Position, site marked, Risks and benefits discussed,  Surgical consent,  Pre-op evaluation,  At surgeon's request and post-op pain management  Laterality: Right  Prep: chloraprep       Needles:  Injection technique: Single-shot  Needle Type: Echogenic Needle     Needle Length: 9cm  Needle Gauge: 21     Additional Needles:   Procedures:,,,, ultrasound used (permanent image in chart),,,,  Narrative:  Start time: 02/02/2018 12:05 PM End time: 02/02/2018 12:10 PM Injection made incrementally with aspirations every 5 mL.  Performed by: Personally  Anesthesiologist: Cecile Hearing, MD  Additional Notes: No pain on injection. No increased resistance to injection. Injection made in 5cc increments.  Good needle visualization.  Patient tolerated procedure well.

## 2018-02-02 NOTE — Anesthesia Procedure Notes (Signed)
Procedure Name: Intubation Date/Time: 02/02/2018 1:35 PM Performed by: Lovie Chol, CRNA Pre-anesthesia Checklist: Patient identified, Emergency Drugs available, Suction available and Patient being monitored Patient Re-evaluated:Patient Re-evaluated prior to induction Oxygen Delivery Method: Circle System Utilized Preoxygenation: Pre-oxygenation with 100% oxygen Induction Type: IV induction Ventilation: Mask ventilation without difficulty Laryngoscope Size: Glidescope and 4 Grade View: Grade I Tube type: Oral Endobronchial tube: Double lumen EBT Tube size: 7.5 mm Number of attempts: 1 Airway Equipment and Method: Stylet and Oral airway Placement Confirmation: ETT inserted through vocal cords under direct vision,  positive ETCO2 and breath sounds checked- equal and bilateral Secured at: 21 cm Tube secured with: Tape Dental Injury: Teeth and Oropharynx as per pre-operative assessment

## 2018-02-02 NOTE — Brief Op Note (Signed)
02/02/2018  4:46 PM  PATIENT:  Philip Bates  57 y.o. male  PRE-OPERATIVE DIAGNOSIS:  RIGHT SHOULDER massive ROTATOR CUFF TEAR  POST-OPERATIVE DIAGNOSIS:  RIGHT SHOULDER Massive ROTATOR CUFF TEAR  PROCEDURE:  Procedure(s): RIGHT SHOULDER ARTHROSCOPY WITH limited debridement and MINI-OPEN ROTATOR CUFF REPAIR  SURGEON:  Surgeon(s): August Saucer, Corrie Mckusick, MD  ASSISTANT: Patrick Jupiter rnfa  ANESTHESIA:   general  EBL: 10 ml    Total I/O In: 1400 [I.V.:1400] Out: 25 [Blood:25]  BLOOD ADMINISTERED: none  DRAINS: none   LOCAL MEDICATIONS USED:  none  SPECIMEN:  No Specimen  COUNTS:  YES  TOURNIQUET:  * No tourniquets in log *  DICTATION: .Other Dictation: Dictation Number [id number]  PLAN OF CARE: Discharge to home after PACU  PATIENT DISPOSITION:  PACU - hemodynamically stable

## 2018-02-02 NOTE — Interval H&P Note (Signed)
History and Physical Interval Note:  02/02/2018 1:05 PM  Philip Bates  has presented today for surgery, with the diagnosis of RIGHT SHOULDER ROTATOR CUFF TEAR  The various methods of treatment have been discussed with the patient and family. After consideration of risks, benefits and other options for treatment, the patient has consented to  Procedure(s): RIGHT SHOULDER ARTHROSCOPY WITH SUBACROMIAL DECOMPRESSION, MINI-OPEN ROTATOR CUFF REPAIR VS SUPERIOR CAPSULAR RECONSTRUCTION (Right) as a surgical intervention .  The patient's history has been reviewed, patient examined, no change in status, stable for surgery.  I have reviewed the patient's chart and labs.  Questions were answered to the patient's satisfaction.     Burnard Bunting

## 2018-02-02 NOTE — Anesthesia Postprocedure Evaluation (Signed)
Anesthesia Post Note  Patient: Philip Bates  Procedure(s) Performed: RIGHT SHOULDER ARTHROSCOPY WITH SUBACROMIAL DECOMPRESSION, MINI-OPEN ROTATOR CUFF REPAIR (Right Shoulder)     Patient location during evaluation: PACU Anesthesia Type: General Level of consciousness: awake and alert Pain management: pain level controlled Vital Signs Assessment: post-procedure vital signs reviewed and stable Respiratory status: spontaneous breathing, nonlabored ventilation and respiratory function stable Cardiovascular status: blood pressure returned to baseline and stable Postop Assessment: no apparent nausea or vomiting Anesthetic complications: no    Last Vitals:  Vitals:   02/02/18 1809 02/02/18 1830  BP: 119/82 119/82  Pulse: 76 75  Resp: 14 16  Temp: 36.7 C   SpO2: 100% 100%    Last Pain:  Vitals:   02/02/18 1640  TempSrc:   PainSc: 0-No pain                 Cecile Hearing

## 2018-02-03 ENCOUNTER — Encounter (HOSPITAL_COMMUNITY): Payer: Self-pay | Admitting: Orthopedic Surgery

## 2018-02-03 NOTE — Op Note (Signed)
NAME:  Philip Bates, PACI                     ACCOUNT NO.:  MEDICAL RECORD NO.:  [id number]  LOCATION:                                 FACILITY:  PHYSICIAN:  Burnard Bunting, M.D.    DATE OF BIRTH:  11/02/1960  DATE OF PROCEDURE: DATE OF DISCHARGE:                              OPERATIVE REPORT   PREOPERATIVE DIAGNOSES:  Right shoulder massive rotator cuff tear and mild synovitis.  POSTOPERATIVE DIAGNOSES:  Right shoulder massive rotator cuff tear and mild synovitis.  PROCEDURE:  Right shoulder massive rotator cuff tear repair and limited debridement and releasing arthroscopically of rotator cuff tissue.  SURGEON:  Burnard Bunting, M.D.  ASSISTANT:  Patrick Jupiter, RNFA.  INDICATIONS:  Young Mulvey is a 57 year old patient with right shoulder rotator cuff tear.  He had previous arthroscopy and was deemed to have an irreparable rotator cuff tear.  Surgical plan today was for attempted rotator cuff tear repair versus superior capsular reconstruction.  OPERATIVE FINDINGS: 1. Examination under anesthesia, the patient had full forward flexion     and adduction on both sides with good stability, anterior and     posterior.  The patient had external rotation of 15 degrees     abduction to about 75 degrees bilaterally. 2. Diagnostic and operative arthroscopy:     a.     Biceps tendon not present.     b.     Mild fraying on the anterior aspect of the humeral head      articular cartilage.     c.     Significant clefts in the rotator cuff with retraction of      the cuff, but with some rotator cuff attachments intact from      previous repair.  Glenohumeral articular ligaments were intact.  DESCRIPTION OF PROCEDURE:  The patient was brought to the operating room where general anesthetic was induced.  Preoperative antibiotics were administered.  Time-out was called.  Right shoulder was examined under anesthesia.  Found to be stable.  At this time, attention was then directed towards the  right shoulder.  The patient was placed in the beach chair position with the head in neutral position.  Right shoulder, arm, and hand were prescrubbed with alcohol and Betadine and allowed to air dry, prepped with DuraPrep solution and draped in a sterile manner. Collier Flowers was used to cover the axilla.  Time-out was called.  A posterior arthroscopy was portal placed 2 cm medial and inferior to the posterolateral margin of the acromion.  Diagnostic arthroscopy was performed.  Anterior portal created under direct visualization.  Limited debridement was performed of some of the tissue around the superior glenoid.  Significant rotator cuff tear was present with a cleft between the infraspinatus and supraspinatus as well as a more medial cleft between the infraspinatus and possibly part of the teres.  In general, there were 2 major clefts with 1 strand of rotator cuff intact attachment to the greater tuberosity.  The anterior cleft extended all the way to the tuberosity.  The posterior cleft was more medial.  This was essentially a hole in the rotator cuff, which did not reach  to the rotator cuff footprint.  After evaluation of the cuff because of the remnant cuff which was present, it was elected not to do a superior capsular reconstruction, but instead to try to repair the rotator cuff. At this time, instruments were removed.  Portals were closed using 3-0 nylon.  Incision was made off the anterolateral margin of the acromion. A split was made 4 cm and marked with a #1 Vicryl suture.  A split was made between the anterior and posterior raphae.  Bursectomy performed. The rotator cuff was immobilizable.  Some mobilizing and debridement were done intra-articularly.  The rest was done in order to mobilize the cuff in an anterior and posterior direction.  After mobilization, there was a small cleft between the repaired previously rotator cuff tendon tear.  This was repaired using 2 FiberWire sutures  placed under direct visualization in an inverted fashion.  Next, the predominant supraspinatus tear which had retracted to some degree anteriorly was mobilized with traction sutures and then fixed using Scorpion with FiberWire sutures up to the rotator cuff footprint.  This required about 4 sutures in order to close that cleft.  This was supraspinatus tissue and not rotator interval tissue.  At the rotator cuff footprint anteriorly, one 5.5 corkscrew from Arthrex was placed.  This was used to further close that cleft between the rotator cuff tissue.  Then, the 4 ends were crossed with 2 ends going anterior, 2 ends going posterior using PushLock.  At this time, the anterior rotator cuff repair looked good.  Attention was then directed towards this posterior rotator cuff hole.  Using tissue mobilization techniques, the posterior and the Scorpion, 0 FiberWire suture was used x4 to close up that cleft between the rotator cuff tissue superiorly and posteriorly.  This gave a nice watertight repair.  At the end, there was a watertight repair achieved. No undue tension was present.  The tendon ends were roughened with a scalpel prior to suture closure.  The footprint anteriorly was also prepared with a curette.  The deltoid split was closed using 0 Vicryl suture, 2-0 Vicryl suture and then a running 3-0 Monocryl.  Impervious dressings along with abduction splint were placed.  The patient tolerated the procedure well without immediate complications, transferred to the recovery room in stable condition.     Burnard Bunting, M.D.     GSD/MEDQ  D:  02/02/2018  T:  02/03/2018  Job:  [id number]

## 2018-02-05 ENCOUNTER — Other Ambulatory Visit (INDEPENDENT_AMBULATORY_CARE_PROVIDER_SITE_OTHER): Payer: Self-pay | Admitting: Orthopedic Surgery

## 2018-02-05 ENCOUNTER — Telehealth (INDEPENDENT_AMBULATORY_CARE_PROVIDER_SITE_OTHER): Payer: Self-pay | Admitting: Orthopedic Surgery

## 2018-02-05 MED ORDER — OXYCODONE HCL 5 MG PO TABS: 5.0000 mg | ORAL_TABLET | Freq: Four times a day (QID) | ORAL | 0 refills | Status: DC | PRN

## 2018-02-05 NOTE — Telephone Encounter (Signed)
Patient is requesting rx refill on oxycodone, he said he will be out some time this weekend. Patients # [phone]

## 2018-02-05 NOTE — Telephone Encounter (Signed)
Dr August Saucer submitted to patients pharmacy.

## 2018-02-05 NOTE — Telephone Encounter (Signed)
Please advise. Surgery was on 4/30.

## 2018-02-09 NOTE — Progress Notes (Deleted)
HPI:  Philip Bates is a 57 y.o.male here today for his routine physical examination and for follow up on cervical pain/insomnia .  Last CPE: 2017 He lives with his wife.  Regular exercise 3 or more times per week: *** Following a healthy diet: ***   Chronic medical problems: Chronic pain,cervicalradiculopathy, CP with muscles spasms and contractures.  Hx of STD's: ***   There is no immunization history on file for this patient.  -Hep C screening: ***   Last colon cancer screening: *** Last prostate ca screening: ***  -Denies high alcohol intake, tobacco use, or Hx of illicit drug use.  -Concerns and/or follow up today: ***  He was seen on 10/09/17 when he was c/o insomnia, which was aggravated by cervical pain and contractures. Diazepam seemed to help in the past ,so started at bedtime. He also follows with ortho,Dr Lajoyce Corners. *** Recently he had right shoulder surgery,rotator cuff tear.   Review of Systems   Current Outpatient Medications on File Prior to Visit  Medication Sig Dispense Refill  . diazepam (VALIUM) 5 MG tablet Take 1 tablet (5 mg total) by mouth daily as needed for muscle spasms (for insomnia and cervical muscle spasm.). 30 tablet 0  . gabapentin (NEURONTIN) 300 MG capsule TAKE 1 CAPSULE BY MOUTH TWICE A DAY 60 capsule 0  . oxyCODONE (OXY IR/ROXICODONE) 5 MG immediate release tablet Take 1 tablet (5 mg total) by mouth every 6 (six) hours as needed for severe pain. 40 tablet 0   No current facility-administered medications on file prior to visit.      Past Medical History:  Diagnosis Date  . Arthritis   . Cerebral palsy (HCC)   . Cervical radiculopathy   . Spondylosis of cervical spine     Past Surgical History:  Procedure Laterality Date  . ANTERIOR CERVICAL DECOMPRESSION/DISCECTOMY FUSION 4 LEVELS N/A 08/11/2017   Procedure: Cervical three to Cervical seven Anterior Cervical discectomy with fusion/plate fixation;  Surgeon: Ditty, Loura Halt, MD;  Location: [facility] OR;  Service: Neurosurgery;  Laterality: N/A;  . BACK SURGERY    . HERNIA REPAIR    . LEG SURGERY    . SHOULDER ARTHROSCOPY WITH ROTATOR CUFF REPAIR AND SUBACROMIAL DECOMPRESSION Right 02/02/2018   Procedure: RIGHT SHOULDER ARTHROSCOPY WITH SUBACROMIAL DECOMPRESSION, MINI-OPEN ROTATOR CUFF REPAIR;  Surgeon: Cammy Copa, MD;  Location: [facility] OR;  Service: Orthopedics;  Laterality: Right;  . SHOULDER SURGERY      Allergies  Allergen Reactions  . Naproxen Other (See Comments)    In high stomach  Irritation   . Flexeril [Cyclobenzaprine] Nausea Only    Family History  Problem Relation Age of Onset  . Cancer Father        esophagus  . Diabetes Brother   . Diabetes Paternal Uncle     Social History   Socioeconomic History  . Marital status: Married    Spouse name: Not on file  . Number of children: Not on file  . Years of education: Not on file  . Highest education level: Not on file  Occupational History  . Not on file  Social Needs  . Financial resource strain: Not on file  . Food insecurity:    Worry: Not on file    Inability: Not on file  . Transportation needs:    Medical: Not on file    Non-medical: Not on file  Tobacco Use  . Smoking status: Current Every Day Smoker    Packs/day:  1.00    Types: Cigarettes  . Smokeless tobacco: Never Used  Substance and Sexual Activity  . Alcohol use: No  . Drug use: No  . Sexual activity: Yes  Lifestyle  . Physical activity:    Days per week: Not on file    Minutes per session: Not on file  . Stress: Not on file  Relationships  . Social connections:    Talks on phone: Not on file    Gets together: Not on file    Attends religious service: Not on file    Active member of club or organization: Not on file    Attends meetings of clubs or organizations: Not on file    Relationship status: Not on file  Other Topics Concern  . Not on file  Social History Narrative  . Not on file     There  were no vitals filed for this visit. There is no height or weight on file to calculate BMI.   Wt Readings from Last 3 Encounters:  02/02/18 116 lb (52.6 kg)  01/26/18 116 lb 11.2 oz (52.9 kg)  12/22/17 114 lb (51.7 kg)      Physical Exam      ASSESSMENT AND PLAN:    There are no diagnoses linked to this encounter.       No follow-ups on file.    Gianfranco Araki G. [place], MD  [facility] [place] office.

## 2018-02-10 ENCOUNTER — Ambulatory Visit: Payer: Medicare HMO | Admitting: Family Medicine

## 2018-02-10 DIAGNOSIS — Z0289 Encounter for other administrative examinations: Secondary | ICD-10-CM

## 2018-02-11 ENCOUNTER — Ambulatory Visit (INDEPENDENT_AMBULATORY_CARE_PROVIDER_SITE_OTHER): Payer: Medicare HMO | Admitting: Orthopedic Surgery

## 2018-02-11 ENCOUNTER — Encounter (INDEPENDENT_AMBULATORY_CARE_PROVIDER_SITE_OTHER): Payer: Self-pay | Admitting: Orthopedic Surgery

## 2018-02-11 DIAGNOSIS — M75101 Unspecified rotator cuff tear or rupture of right shoulder, not specified as traumatic: Secondary | ICD-10-CM

## 2018-02-12 ENCOUNTER — Other Ambulatory Visit: Payer: Self-pay

## 2018-02-12 ENCOUNTER — Emergency Department (HOSPITAL_COMMUNITY)
Admission: EM | Admit: 2018-02-12 | Discharge: 2018-02-12 | Disposition: A | Discharge status: Home / Self Care | Payer: Medicare HMO | Attending: Emergency Medicine | Admitting: Emergency Medicine

## 2018-02-12 ENCOUNTER — Encounter (HOSPITAL_COMMUNITY): Payer: Self-pay | Admitting: *Deleted

## 2018-02-12 DIAGNOSIS — Z79899 Other long term (current) drug therapy: Secondary | ICD-10-CM | POA: Insufficient documentation

## 2018-02-12 DIAGNOSIS — K529 Noninfective gastroenteritis and colitis, unspecified: Secondary | ICD-10-CM | POA: Insufficient documentation

## 2018-02-12 DIAGNOSIS — F17210 Nicotine dependence, cigarettes, uncomplicated: Secondary | ICD-10-CM | POA: Diagnosis not present

## 2018-02-12 DIAGNOSIS — R112 Nausea with vomiting, unspecified: Secondary | ICD-10-CM | POA: Diagnosis present

## 2018-02-12 LAB — COMPREHENSIVE METABOLIC PANEL
ALBUMIN: 3.5 g/dL (ref 3.5–5.0)
ALK PHOS: 67 U/L (ref 38–126)
ALT: 21 U/L (ref 17–63)
ANION GAP: 11 (ref 5–15)
AST: 24 U/L (ref 15–41)
BILIRUBIN TOTAL: 0.8 mg/dL (ref 0.3–1.2)
BUN: 11 mg/dL (ref 6–20)
CALCIUM: 9 mg/dL (ref 8.9–10.3)
CO2: 27 mmol/L (ref 22–32)
CREATININE: 0.71 mg/dL (ref 0.61–1.24)
Chloride: 96 mmol/L — ABNORMAL LOW (ref 101–111)
GFR calc Af Amer: >60 mL/min (ref >60)
GFR calc non Af Amer: >60 mL/min (ref >60)
GLUCOSE: 120 mg/dL — AB (ref 65–99)
Potassium: 3.7 mmol/L (ref 3.5–5.1)
SODIUM: 134 mmol/L — AB (ref 135–145)
Total Protein: 7.2 g/dL (ref 6.5–8.1)

## 2018-02-12 LAB — URINALYSIS, ROUTINE W REFLEX MICROSCOPIC
Bilirubin Urine: NEGATIVE
Glucose, UA: NEGATIVE mg/dL
Hgb urine dipstick: NEGATIVE
KETONES UR: NEGATIVE mg/dL
LEUKOCYTES UA: NEGATIVE
Nitrite: NEGATIVE
PROTEIN: NEGATIVE mg/dL
Specific Gravity, Urine: 1.02 (ref 1.005–1.030)
pH: 5 (ref 5.0–8.0)

## 2018-02-12 LAB — CBC
HCT: 36.1 % — ABNORMAL LOW (ref 39.0–52.0)
Hemoglobin: 12.5 g/dL — ABNORMAL LOW (ref 13.0–17.0)
MCH: 27.2 pg (ref 26.0–34.0)
MCHC: 34.6 g/dL (ref 30.0–36.0)
MCV: 78.6 fL (ref 78.0–100.0)
PLATELETS: 271 10*3/uL (ref 150–400)
RBC: 4.59 MIL/uL (ref 4.22–5.81)
RDW: 14 % (ref 11.5–15.5)
WBC: 6.9 10*3/uL (ref 4.0–10.5)

## 2018-02-12 LAB — LIPASE, BLOOD: Lipase: 24 U/L (ref 11–51)

## 2018-02-12 MED ORDER — ONDANSETRON 4 MG PO TBDP
4.0000 mg | ORAL_TABLET | Freq: Once | ORAL | Status: AC | PRN
  Administered 2018-02-12: 4 mg via ORAL
  Filled 2018-02-12: qty 1

## 2018-02-12 MED ORDER — ONDANSETRON HCL 4 MG PO TABS: 4.0000 mg | ORAL_TABLET | Freq: Three times a day (TID) | ORAL | 0 refills | Status: DC | PRN

## 2018-02-12 MED ORDER — SODIUM CHLORIDE 0.9 % IV BOLUS
1000.0000 mL | Freq: Once | INTRAVENOUS | Status: AC
  Administered 2018-02-12: 1000 mL via INTRAVENOUS

## 2018-02-12 NOTE — Discharge Instructions (Addendum)
Nice to meet you Philip Bates.  Your labs have looked good here and it does not look like you have an infection or a problem with your abdominal organs causing your pain. This may be a virus or stomach bug that you picked up from somewhere. We gave some fluids to help your blood pressure and symptoms. You should continue to improve with time and we will give some anti-nausea medicine to go home with. Prioritize staying hydrated with fluids and eat bland foods for a few days while you continue to get better. Your urine test also looked great without any signs of infection or other abnormalities.

## 2018-02-12 NOTE — ED Triage Notes (Signed)
States he has shoulder surgery  On Tues and states since Monday he hasn't been able to eat, c/o nauseated and vomiting. States the smell of food makes him nauseated.

## 2018-02-12 NOTE — ED Notes (Signed)
Pt and family verbalized understanding of discharge instructions °

## 2018-02-12 NOTE — ED Provider Notes (Signed)
[facility] EMERGENCY DEPARTMENT Provider Note   CSN: [id number] Arrival date & time: 02/12/18  4098  History   Chief Complaint Chief Complaint  Patient presents with  . Emesis    HPI Philip Bates is a 57 y.o. male past medical history of tobacco use, musculoskeletal issues including recent right rotator cuff repair on 4/30 who presented to the ED with complaints of nausea and vomiting.  Reports a 4-5-day history of nausea and vomiting with associated symptoms of generalized weakness and fatigue, subjective fever 2 days ago with intermittent chills.  They did not take temperature at the time.  He notes a total of 8-12 episodes of emesis since symptom onset, yellow/green in color without blood.  Also notes several days of diarrhea (also nonbloody or melanic), but last bowel movement was normal. Notes abdominal muscle soreness from vomiting, no other abdominal pain. He reports decreased intake of solid foods but has tried to stay hydrated with liquids.  Denies dietary changes or suspicious foods.  Does note wife had similar symptoms 2 weeks ago.    He saw his orthopedic surgeon on follow-up yesterday clinic, he states his doctor was asking him questions regarding urinary symptoms which he denied at the time, but states last night he did feel some burning at the end of urination and with retraction.  Also reports increased urinary frequency. He states his BP was low at office visit yesterday.   Past Medical History:  Diagnosis Date  . Arthritis   . Cerebral palsy (HCC)   . Cervical radiculopathy   . Spondylosis of cervical spine     Patient Active Problem List   Diagnosis Date Noted  . Cervical spondylosis with radiculopathy 08/11/2017  . Chronic right shoulder pain 06/04/2017  . Radiculopathy, cervical 06/01/2017  . Tobacco use disorder 02/10/2017  . Insomnia 02/10/2017  . Cerebral palsy (HCC) 02/10/2017  . Chronic lower back pain 02/10/2017    Past Surgical  History:  Procedure Laterality Date  . ANTERIOR CERVICAL DECOMPRESSION/DISCECTOMY FUSION 4 LEVELS N/A 08/11/2017   Procedure: Cervical three to Cervical seven Anterior Cervical discectomy with fusion/plate fixation;  Surgeon: Ditty, Loura Halt, MD;  Location: [facility] OR;  Service: Neurosurgery;  Laterality: N/A;  . BACK SURGERY    . HERNIA REPAIR    . LEG SURGERY    . SHOULDER ARTHROSCOPY WITH ROTATOR CUFF REPAIR AND SUBACROMIAL DECOMPRESSION Right 02/02/2018   Procedure: RIGHT SHOULDER ARTHROSCOPY WITH SUBACROMIAL DECOMPRESSION, MINI-OPEN ROTATOR CUFF REPAIR;  Surgeon: Cammy Copa, MD;  Location: [facility] OR;  Service: Orthopedics;  Laterality: Right;  . SHOULDER SURGERY          Home Medications    Prior to Admission medications   Medication Sig Start Date End Date Taking? Authorizing Provider  diazepam (VALIUM) 5 MG tablet Take 1 tablet (5 mg total) by mouth daily as needed for muscle spasms (for insomnia and cervical muscle spasm.). 01/24/18   [place], Betty G, MD  gabapentin (NEURONTIN) 300 MG capsule TAKE 1 CAPSULE BY MOUTH TWICE A DAY 02/01/18   Cammy Copa, MD  ondansetron (ZOFRAN) 4 MG tablet Take 1 tablet (4 mg total) by mouth every 8 (eight) hours as needed for nausea or vomiting. 02/12/18   Ginger Carne, MD  oxyCODONE (OXY IR/ROXICODONE) 5 MG immediate release tablet Take 1 tablet (5 mg total) by mouth every 6 (six) hours as needed for severe pain. 02/05/18   Cammy Copa, MD    Family History Family History  Problem Relation  Age of Onset  . Cancer Father        esophagus  . Diabetes Brother   . Diabetes Paternal Uncle     Social History Social History   Tobacco Use  . Smoking status: Current Every Day Smoker    Packs/day: 1.00    Types: Cigarettes  . Smokeless tobacco: Never Used  Substance Use Topics  . Alcohol use: No  . Drug use: No     Allergies   Naproxen and Flexeril [cyclobenzaprine]   Review of Systems Review of Systems    Constitutional: Positive for appetite change, chills, fatigue and fever (subjective).  Respiratory: Negative for shortness of breath.   Cardiovascular: Negative for chest pain.  Gastrointestinal: Positive for diarrhea, nausea and vomiting. Negative for abdominal pain, anal bleeding, blood in stool and constipation.     Physical Exam Updated Vital Signs BP 115/72   Pulse 72   Temp 98.5 F (36.9 C) (Oral)   Resp 19   Ht  (1.626 m)   Wt 50.8 kg (112 lb)   SpO2 98%   BMI 19.22 kg/m   General: Resting on stretcher comfortably, no acute distress Head: Normocephalic, atraumatic Eyes: PERRL, EOMI ENT: Moist mucus membranes, no exudate CV: RRR, s1, s2 Resp: Clear breath sounds bilaterally, normal work of breathing, no distress  Abd: +BS, no distension, non-tender to palpation in all quadrants  Extr: No LE edema, good cap refill. Small well healing incision to R shoulder with steristrips in place  Neuro: Alert and oriented x3, no gross focal deficits  Skin: Warm, dry      ED Treatments / Results  Labs (all labs ordered are listed, but only abnormal results are displayed) Labs Reviewed  COMPREHENSIVE METABOLIC PANEL - Abnormal; Notable for the following components:      Result Value   Sodium 134 (*)    Chloride 96 (*)    Glucose, Bld 120 (*)    All other components within normal limits  CBC - Abnormal; Notable for the following components:   Hemoglobin 12.5 (*)    HCT 36.1 (*)    All other components within normal limits  URINALYSIS, ROUTINE W REFLEX MICROSCOPIC - Abnormal; Notable for the following components:   APPearance HAZY (*)    All other components within normal limits  LIPASE, BLOOD    EKG None  Radiology No results found.  Procedures Procedures (including critical care time)  Medications Ordered in ED Medications  ondansetron (ZOFRAN-ODT) disintegrating tablet 4 mg (4 mg Oral Given 02/12/18 0758)  sodium chloride 0.9 % bolus 1,000 mL (0 mLs  Intravenous Stopped 02/12/18 1401)     Initial Impression / Assessment and Plan / ED Course  I have reviewed the triage vital signs and the nursing notes.  Pertinent labs & imaging results that were available during my care of the patient were reviewed by me and considered in my medical decision making (see chart for details).  57 year old male history of recent right rotator cuff repair presenting with nausea and vomiting.  His blood pressure is somewhat soft, down to 90s systolic during history.  Labs unremarkable and show patient is maintaining hydration with fluids with normal creatinine and BUN. LFTs normal, lipase negative, no white count, afebrile. Abdominal pain is musculoskeletal in description and reassuring exam, do not feel imaging is currently indicated.  His symptoms improved with initial dose of Zofran.  Will administer 1 L IV fluid bolus and reassess. Will also collect urine to assess for signs  of urinary infection.  On reevaluation following fluids, patient states he feels improved with no current nausea.  He has eaten a burger from the cafeteria with no recurrent nausea or vomiting. Urine returned normal without signs of infection. Will discharge pt with supply of anti-emetic for symptomatic treatment, recommend continued oral hydration and follow up with PCP for re-evaluation. Case discussed with Dr. Jacqulyn Bath, pt counseled and agreeable with plan.   Final Clinical Impressions(s) / ED Diagnoses   Final diagnoses:  Gastroenteritis    ED Discharge Orders        Ordered    ondansetron (ZOFRAN) 4 MG tablet  Every 8 hours PRN     02/12/18 1433       Ginger Carne, MD 02/12/18 1435    Long, Arlyss Repress, MD 02/12/18 2327

## 2018-02-14 ENCOUNTER — Encounter (INDEPENDENT_AMBULATORY_CARE_PROVIDER_SITE_OTHER): Payer: Self-pay | Admitting: Orthopedic Surgery

## 2018-02-14 NOTE — Progress Notes (Signed)
   Post-Op Visit Note   Patient: Philip Bates           Date of Birth: September 09, 1961           MRN: [id number] Visit Date: 02/11/2018 PCP: [place], Philip Bates   Assessment & Plan:  Chief Complaint:  Chief Complaint  Patient presents with  . Right Shoulder - Routine Post Op   Visit Diagnoses:  1. Tear of right rotator cuff, unspecified tear extent, unspecified whether traumatic     Plan: [name] is a patient is a week out right shoulder massive rotator cuff repair.  He is having some fever nausea vomiting.  There is a viral bug going around the gives those symptoms.  The right shoulder has no evidence of infection.  All incisions are well-healed and the range of motion passively feels good.  Plan at this time is to have him continue with passive range of motion only and come back in 2 weeks.  He does not really look septic today and is not having any urinary symptoms or pulmonary symptoms.  The shoulder itself also does not have any evidence of infection based on exam.  I did tell his caregiver to consider emergency room evaluation or going to his primary care doctor if his symptoms warrant.  Current temperature is 98 and his blood pressure is about 100/70 with heart rate in the 80s.  Follow-Up Instructions: Return in about 2 weeks (around 02/25/2018).   Orders:  No orders of the defined types were placed in this encounter.  No orders of the defined types were placed in this encounter.   Imaging: No results found.  PMFS History: Patient Active Problem List   Diagnosis Date Noted  . Cervical spondylosis with radiculopathy 08/11/2017  . Chronic right shoulder pain 06/04/2017  . Radiculopathy, cervical 06/01/2017  . Tobacco use disorder 02/10/2017  . Insomnia 02/10/2017  . Cerebral palsy (HCC) 02/10/2017  . Chronic lower back pain 02/10/2017   Past Medical History:  Diagnosis Date  . Arthritis   . Cerebral palsy (HCC)   . Cervical radiculopathy   . Spondylosis of cervical spine      Family History  Problem Relation Age of Onset  . Cancer Father        esophagus  . Diabetes Brother   . Diabetes Paternal Uncle     Past Surgical History:  Procedure Laterality Date  . ANTERIOR CERVICAL DECOMPRESSION/DISCECTOMY FUSION 4 LEVELS N/A 08/11/2017   Procedure: Cervical three to Cervical seven Anterior Cervical discectomy with fusion/plate fixation;  Surgeon: Philip Bates;  Location: [facility] OR;  Service: Neurosurgery;  Laterality: N/A;  . BACK SURGERY    . HERNIA REPAIR    . LEG SURGERY    . SHOULDER ARTHROSCOPY WITH ROTATOR CUFF REPAIR AND SUBACROMIAL DECOMPRESSION Right 02/02/2018   Procedure: RIGHT SHOULDER ARTHROSCOPY WITH SUBACROMIAL DECOMPRESSION, MINI-OPEN ROTATOR CUFF REPAIR;  Surgeon: Philip Bates;  Location: [facility] OR;  Service: Orthopedics;  Laterality: Right;  . SHOULDER SURGERY     Social History   Occupational History  . Not on file  Tobacco Use  . Smoking status: Current Every Day Smoker    Packs/day: 1.00    Types: Cigarettes  . Smokeless tobacco: Never Used  Substance and Sexual Activity  . Alcohol use: No  . Drug use: No  . Sexual activity: Yes

## 2018-02-17 ENCOUNTER — Telehealth (INDEPENDENT_AMBULATORY_CARE_PROVIDER_SITE_OTHER): Payer: Self-pay | Admitting: Orthopedic Surgery

## 2018-02-17 MED ORDER — OXYCODONE HCL 5 MG PO TABS: ORAL_TABLET | ORAL | 0 refills | Status: DC

## 2018-02-17 NOTE — Telephone Encounter (Signed)
IC advised could pick up at front desk and take to pharmacy.  

## 2018-02-17 NOTE — Telephone Encounter (Signed)
Patient called requesting refill of Oxycodone. ° °Please call patient to advise. °

## 2018-02-17 NOTE — Telephone Encounter (Signed)
Ok to rf? Please advise. Thanks.  

## 2018-02-17 NOTE — Telephone Encounter (Signed)
Y 5 mg 1 po q 8 # 35

## 2018-02-22 ENCOUNTER — Ambulatory Visit (INDEPENDENT_AMBULATORY_CARE_PROVIDER_SITE_OTHER): Payer: Medicare HMO | Admitting: Family Medicine

## 2018-02-22 ENCOUNTER — Encounter: Payer: Self-pay | Admitting: *Deleted

## 2018-02-22 ENCOUNTER — Encounter: Payer: Self-pay | Admitting: Family Medicine

## 2018-02-22 VITALS — BP 120/82 | HR 79 | Temp 97.9°F | Resp 12 | Ht 64.0 in | Wt 114.2 lb

## 2018-02-22 DIAGNOSIS — N529 Male erectile dysfunction, unspecified: Secondary | ICD-10-CM | POA: Insufficient documentation

## 2018-02-22 DIAGNOSIS — G4700 Insomnia, unspecified: Secondary | ICD-10-CM | POA: Diagnosis not present

## 2018-02-22 DIAGNOSIS — R300 Dysuria: Secondary | ICD-10-CM | POA: Diagnosis not present

## 2018-02-22 DIAGNOSIS — N390 Urinary tract infection, site not specified: Secondary | ICD-10-CM

## 2018-02-22 DIAGNOSIS — G809 Cerebral palsy, unspecified: Secondary | ICD-10-CM | POA: Diagnosis not present

## 2018-02-22 LAB — POCT URINALYSIS DIPSTICK
BILIRUBIN UA: NEGATIVE
Glucose, UA: NEGATIVE
Ketones, UA: NEGATIVE
Leukocytes, UA: NEGATIVE
NITRITE UA: NEGATIVE
PH UA: 6 (ref 5.0–8.0)
Protein, UA: NEGATIVE
RBC UA: NEGATIVE
Spec Grav, UA: 1.02 (ref 1.010–1.025)
Urobilinogen, UA: 0.2 E.U./dL

## 2018-02-22 MED ORDER — SILDENAFIL CITRATE 20 MG PO TABS: 40.0000 mg | ORAL_TABLET | Freq: Every day | ORAL | 1 refills | Status: DC | PRN

## 2018-02-22 MED ORDER — CIPROFLOXACIN HCL 250 MG PO TABS: 250.0000 mg | ORAL_TABLET | Freq: Two times a day (BID) | ORAL | 0 refills | Status: DC

## 2018-02-22 NOTE — Patient Instructions (Addendum)
A few things to remember from today's visit:   Dysuria - Plan: POC Urinalysis Dipstick, Urine culture  Urinary tract infection without hematuria, site unspecified - Plan: ciprofloxacin (CIPRO) 250 MG tablet, Urine culture  Insomnia, unspecified type  Cerebral palsy, unspecified type (HCC)    Adequate fluid intake, avoid holding urine for long hours, and over the counter Vit C OR cranberry capsules might help.  Today we will treat empirically with antibiotic, which we might need to change when urine culture comes back depending of bacteria susceptibility.  Seek immediate medical attention if severe abdominal pain, vomiting, fever/chills, or worsening symptoms. F/U if symptomatic are not any better after 2-3 days of antibiotic treatment.  Please be sure medication list is accurate. If a new problem present, please set up appointment sooner than planned today.

## 2018-02-22 NOTE — Assessment & Plan Note (Signed)
Aggravated by contractures. Valium helping. Good sleep hygiene. F/U in 4 months.

## 2018-02-22 NOTE — Assessment & Plan Note (Signed)
Valium helping. Continue Valium 5 mg at bedtime as needed.

## 2018-02-22 NOTE — Progress Notes (Signed)
ACUTE VISIT   HPI:  Chief Complaint  Patient presents with  . Dysuria    started last week    Mr.Breyer Cottingham is a 57 y.o. male, who is here today complaining of dysuria and urinary frequency.  States that symptoms started after right shoulder surgery. Urine cath removed after surgery,had difficulty voiding then started with urgency and dysuria.  He was to the ER on 02/12/18 because GI symptoms.  Diarrhea, nausea,and vomiting have resoled.  He has taken cranberry juice and cider vinegar, which has helped, "burning decreased a little bit."  Denies gross hematuria or decreased urine output. No fever,chills,worsening back pain,abdominal pain, or urethral discharge.  ED: Sometimes he has trouble having an erection for the past 2 months. Intermittently, he has not identified exacerbating or alleviating factors. Stable otherwise.  No deformities or lesion or testicular masses.  No decreased libido or nipple discharge and no headaches.  Cervical pain and insomnia: CP with contractures and chronic neck pain, which interferes with sleep. He is on Valium 5 mg at bedtime, which has helped contractures,neck pain and therefore with insomnia.  No side effects reported.   Review of Systems  Constitutional: Negative for appetite change, fatigue and fever.  HENT: Negative for mouth sores and trouble swallowing.   Respiratory: Negative for shortness of breath and wheezing.   Cardiovascular: Negative for leg swelling.  Gastrointestinal: Negative for abdominal pain, nausea and vomiting.       No changes in bowel habits.  Genitourinary: Positive for dysuria and frequency. Negative for decreased urine volume, discharge, flank pain, hematuria, scrotal swelling and urgency.  Musculoskeletal: Positive for arthralgias, gait problem and neck pain. Negative for myalgias.  Skin: Negative for rash.  Neurological: Negative for syncope, weakness and headaches.  Psychiatric/Behavioral:  Positive for sleep disturbance. Negative for confusion.      Current Outpatient Medications on File Prior to Visit  Medication Sig Dispense Refill  . diazepam (VALIUM) 5 MG tablet Take 1 tablet (5 mg total) by mouth daily as needed for muscle spasms (for insomnia and cervical muscle spasm.). 30 tablet 0  . gabapentin (NEURONTIN) 300 MG capsule TAKE 1 CAPSULE BY MOUTH TWICE A DAY 60 capsule 0  . ondansetron (ZOFRAN) 4 MG tablet Take 1 tablet (4 mg total) by mouth every 8 (eight) hours as needed for nausea or vomiting. 20 tablet 0  . oxyCODONE (OXY IR/ROXICODONE) 5 MG immediate release tablet 1 po q 8 hrs prn pain 35 tablet 0   No current facility-administered medications on file prior to visit.      Past Medical History:  Diagnosis Date  . Arthritis   . Cerebral palsy (HCC)   . Cervical radiculopathy   . Spondylosis of cervical spine    Allergies  Allergen Reactions  . Naproxen Other (See Comments)    In high stomach  Irritation   . Flexeril [Cyclobenzaprine] Nausea Only    Social History   Socioeconomic History  . Marital status: Married    Spouse name: Not on file  . Number of children: Not on file  . Years of education: Not on file  . Highest education level: Not on file  Occupational History  . Not on file  Social Needs  . Financial resource strain: Not on file  . Food insecurity:    Worry: Not on file    Inability: Not on file  . Transportation needs:    Medical: Not on file    Non-medical: Not on file  Tobacco Use  . Smoking status: Current Every Day Smoker    Packs/day: 1.00    Types: Cigarettes  . Smokeless tobacco: Never Used  Substance and Sexual Activity  . Alcohol use: No  . Drug use: No  . Sexual activity: Yes  Lifestyle  . Physical activity:    Days per week: Not on file    Minutes per session: Not on file  . Stress: Not on file  Relationships  . Social connections:    Talks on phone: Not on file    Gets together: Not on file    Attends  religious service: Not on file    Active member of club or organization: Not on file    Attends meetings of clubs or organizations: Not on file    Relationship status: Not on file  Other Topics Concern  . Not on file  Social History Narrative  . Not on file    Vitals:   02/22/18 1604  BP: 120/82  Pulse: 79  Resp: 12  Temp: 97.9 F (36.6 C)  SpO2: 99%   Body mass index is 19.61 kg/m.   Physical Exam  Nursing note and vitals reviewed. Constitutional: He is oriented to person, place, and time. He appears well-developed. No distress.  HENT:  Head: Normocephalic and atraumatic.  Mouth/Throat: Oropharynx is clear and moist and mucous membranes are normal.  Eyes: Conjunctivae and EOM are normal.  Cardiovascular: Normal rate and regular rhythm.  No murmur heard. Respiratory: Effort normal and breath sounds normal. No respiratory distress.  GI: Soft. He exhibits no mass. There is no hepatomegaly. There is no tenderness. There is no CVA tenderness.  Musculoskeletal: He exhibits no edema.  + Scoliosis. LLE muscle atrophy. Limitation of flexion and extension knee,bilateral.  Neurological: He is alert and oriented to person, place, and time. He has normal strength. Gait abnormal.  Skin: Skin is warm. No erythema.  Psychiatric: He has a normal mood and affect.  Well groomed, good eye contact.      ASSESSMENT AND PLAN:  Mr. Kenner was seen today for dysuria.  Diagnoses and all orders for this visit:  Dysuria  Possible etiologies discussed. Denies risk factors for STD's. Urine dipstick negative.  -     POC Urinalysis Dipstick -     Urine culture  Urinary tract infection without hematuria, site unspecified  Based on symptoms. Ucx ordered.   Empiric abx treatment started today and will be tailored according to Ucx results and susceptibility report.  Clearly instructed about warning signs. F/U if symptoms persist.  -     ciprofloxacin (CIPRO) 250 MG tablet; Take 1  tablet (250 mg total) by mouth 2 (two) times daily. -     Urine culture   Insomnia Aggravated by contractures. Valium helping. Good sleep hygiene. F/U in 4 months.  Cerebral palsy (HCC) Valium helping. Continue Valium 5 mg at bedtime as needed.   Erectile dysfunction Treatment options discussed as well as side effects. Sildenafil 20 mg 1-2 tabs daily as needed. F/U in 3-4 months.      Return in 4 months (on 06/25/2018) for cervicalgia/insomnia.      Betty G. [place], MD  [facility]. [place] office.

## 2018-02-22 NOTE — Assessment & Plan Note (Signed)
Treatment options discussed as well as side effects. Sildenafil 20 mg 1-2 tabs daily as needed. F/U in 3-4 months.

## 2018-02-23 MED ORDER — DIAZEPAM 5 MG PO TABS: 5.0000 mg | ORAL_TABLET | Freq: Every day | ORAL | 2 refills | Status: DC | PRN

## 2018-02-23 NOTE — Addendum Note (Signed)
Addended by: [place], BETTY G on: 02/23/2018 10:47 PM   Modules accepted: Orders

## 2018-02-24 LAB — URINE CULTURE
MICRO NUMBER: 90610555
SPECIMEN QUALITY: ADEQUATE

## 2018-02-26 ENCOUNTER — Telehealth (INDEPENDENT_AMBULATORY_CARE_PROVIDER_SITE_OTHER): Payer: Self-pay | Admitting: Orthopedic Surgery

## 2018-02-26 NOTE — Telephone Encounter (Signed)
Please advise 

## 2018-02-26 NOTE — Telephone Encounter (Signed)
Did Dr. August Saucer complete and sign rx for this pt?

## 2018-02-26 NOTE — Telephone Encounter (Signed)
Ok to rf x 1 only pls tell him next will be norco

## 2018-02-26 NOTE — Telephone Encounter (Signed)
Patient called asking for a refill on oxycodone. CB # [phone]

## 2018-02-26 NOTE — Telephone Encounter (Signed)
Patient called to check on rx, told him it was not ready and it would be atleast Tuesday.

## 2018-03-02 MED ORDER — OXYCODONE HCL 5 MG PO TABS: ORAL_TABLET | ORAL | 0 refills | Status: DC

## 2018-03-02 NOTE — Telephone Encounter (Signed)
Patient is wondering if the rx can be called in the pharmacy? I told him with the kind of medication it is, probably not but I would check. I also told patient Dr. August Saucer is in surgery but would be able to sign rx in the morning.

## 2018-03-02 NOTE — Telephone Encounter (Signed)
IC advised could pick up at front desk in the morning.  

## 2018-03-02 NOTE — Telephone Encounter (Signed)
IC advised would have to wait until in the morning.

## 2018-03-02 NOTE — Telephone Encounter (Signed)
I never saw any message on this patient on Friday, nor did I see an Rx written out for him.

## 2018-03-02 NOTE — Telephone Encounter (Signed)
Did Dr August Saucer sign this on Friday?

## 2018-03-02 NOTE — Addendum Note (Signed)
Addended byPrescott Parma on: 03/02/2018 01:05 PM   Modules accepted: Orders

## 2018-03-04 ENCOUNTER — Encounter (INDEPENDENT_AMBULATORY_CARE_PROVIDER_SITE_OTHER): Payer: Self-pay | Admitting: Orthopedic Surgery

## 2018-03-04 ENCOUNTER — Ambulatory Visit (INDEPENDENT_AMBULATORY_CARE_PROVIDER_SITE_OTHER): Payer: Medicare HMO | Admitting: Orthopedic Surgery

## 2018-03-04 DIAGNOSIS — M75101 Unspecified rotator cuff tear or rupture of right shoulder, not specified as traumatic: Secondary | ICD-10-CM

## 2018-03-06 ENCOUNTER — Encounter (INDEPENDENT_AMBULATORY_CARE_PROVIDER_SITE_OTHER): Payer: Self-pay | Admitting: Orthopedic Surgery

## 2018-03-06 NOTE — Progress Notes (Signed)
   Post-Op Visit Note   Patient: Philip FothergillJoseph Bates           Date of Birth: 06/30/1961           MRN: [id number] Visit Date: 03/04/2018 PCP: SwazilandJordan, Betty G, MD   Assessment & Plan:  Chief Complaint:  Chief Complaint  Patient presents with  . Right Shoulder - Follow-up   Visit Diagnoses:  1. Tear of right rotator cuff, unspecified tear extent, unspecified whether traumatic     Plan: Philip Bates is a patient who is now a month out right shoulder arthroscopy mini open rotator cuff tear repair.  He is in a sling.  He has not started physical therapy yet.  His cuff tear was a massive rotator cuff tear.  We are trying to do a salvage repair so that he would have to have reverse shoulder replacement at an early age.  He is on the CPM machine for an hour a day.  On examination he does have pretty reasonable passive range of motion with some but not excessive coarse grinding.  Strength is reasonable.  Plan at this time is to keep him in the sling for 2 more weeks but start physical therapy in 2 weeks.  DC the sling in 2 weeks time.  I like for him to get a full 6 weeks of relative immobilization with this massive cuff tear.  Come back in 4 weeks for clinical recheck  Follow-Up Instructions: Return in about 1 month (around 04/01/2018).   Orders:  Orders Placed This Encounter  Procedures  . Ambulatory referral to Physical Therapy   No orders of the defined types were placed in this encounter.   Imaging: No results found.  PMFS History: Patient Active Problem List   Diagnosis Date Noted  . Erectile dysfunction 02/22/2018  . Cervical spondylosis with radiculopathy 08/11/2017  . Chronic right shoulder pain 06/04/2017  . Radiculopathy, cervical 06/01/2017  . Tobacco use disorder 02/10/2017  . Insomnia 02/10/2017  . Cerebral palsy (HCC) 02/10/2017  . Chronic lower back pain 02/10/2017   Past Medical History:  Diagnosis Date  . Arthritis   . Cerebral palsy (HCC)   . Cervical radiculopathy     . Spondylosis of cervical spine     Family History  Problem Relation Age of Onset  . Cancer Father        esophagus  . Diabetes Brother   . Diabetes Paternal Uncle     Past Surgical History:  Procedure Laterality Date  . ANTERIOR CERVICAL DECOMPRESSION/DISCECTOMY FUSION 4 LEVELS N/A 08/11/2017   Procedure: Cervical three to Cervical seven Anterior Cervical discectomy with fusion/plate fixation;  Surgeon: Ditty, Loura HaltBenjamin Jared, MD;  [facility];  Service: Neurosurgery;  Laterality: N/A;  . BACK SURGERY    . HERNIA REPAIR    . LEG SURGERY    . SHOULDER ARTHROSCOPY WITH ROTATOR CUFF REPAIR AND SUBACROMIAL DECOMPRESSION Right 02/02/2018   Procedure: RIGHT SHOULDER ARTHROSCOPY WITH SUBACROMIAL DECOMPRESSION, MINI-OPEN ROTATOR CUFF REPAIR;  Surgeon: Cammy Copaean, Aarini Slee Scott, MD;  [facility];  Service: Orthopedics;  Laterality: Right;  . SHOULDER SURGERY     Social History   Occupational History  . Not on file  Tobacco Use  . Smoking status: Current Every Day Smoker    Packs/day: 1.00    Types: Cigarettes  . Smokeless tobacco: Never Used  Substance and Sexual Activity  . Alcohol use: No  . Drug use: No  . Sexual activity: Yes

## 2018-03-12 ENCOUNTER — Telehealth (INDEPENDENT_AMBULATORY_CARE_PROVIDER_SITE_OTHER): Payer: Self-pay | Admitting: Orthopedic Surgery

## 2018-03-12 MED ORDER — HYDROCODONE-ACETAMINOPHEN 5-325 MG PO TABS: 1.0000 | ORAL_TABLET | Freq: Two times a day (BID) | ORAL | 0 refills | Status: DC | PRN

## 2018-03-12 NOTE — Telephone Encounter (Signed)
Put up front for patient to pick up. Tried calling patient to advise, no answer.

## 2018-03-12 NOTE — Telephone Encounter (Signed)
Ok to rf? On 03/02/18 patient received #30 1po q 8-12hrs pain  Please advise. Thanks.

## 2018-03-12 NOTE — Telephone Encounter (Signed)
Patient called needing Rx refilled (Oxycodone) The number to contact patient is [phone]

## 2018-03-12 NOTE — Telephone Encounter (Signed)
Ok for norco 1 po q 12 # 30

## 2018-03-14 ENCOUNTER — Other Ambulatory Visit (INDEPENDENT_AMBULATORY_CARE_PROVIDER_SITE_OTHER): Payer: Self-pay | Admitting: Orthopedic Surgery

## 2018-03-15 NOTE — Telephone Encounter (Signed)
Ok to rf? 

## 2018-03-15 NOTE — Telephone Encounter (Signed)
y

## 2018-03-22 ENCOUNTER — Ambulatory Visit: Payer: Medicare HMO | Attending: Orthopedic Surgery | Admitting: Physical Therapy

## 2018-03-25 ENCOUNTER — Other Ambulatory Visit (INDEPENDENT_AMBULATORY_CARE_PROVIDER_SITE_OTHER): Payer: Self-pay | Admitting: Orthopedic Surgery

## 2018-03-25 ENCOUNTER — Telehealth (INDEPENDENT_AMBULATORY_CARE_PROVIDER_SITE_OTHER): Payer: Self-pay | Admitting: Orthopedic Surgery

## 2018-03-25 NOTE — Telephone Encounter (Signed)
Ok to rf? 

## 2018-03-25 NOTE — Telephone Encounter (Signed)
Please advise. Thanks.  

## 2018-03-25 NOTE — Telephone Encounter (Signed)
Patient called asked of Dr August Saucerean will prescribe him (Tramadol) to help with the pain. Patient said the Hydrocodone is not working. Patient said he is going to (PT) 3 days a wk. The number to contact patient is [phone]

## 2018-03-26 NOTE — Telephone Encounter (Signed)
y

## 2018-03-26 NOTE — Telephone Encounter (Signed)
Called to pharmacy. Patient advise done.

## 2018-03-26 NOTE — Telephone Encounter (Signed)
Ok for tramadol

## 2018-04-01 ENCOUNTER — Ambulatory Visit (INDEPENDENT_AMBULATORY_CARE_PROVIDER_SITE_OTHER): Payer: Medicare HMO | Admitting: Orthopedic Surgery

## 2018-04-05 ENCOUNTER — Telehealth (INDEPENDENT_AMBULATORY_CARE_PROVIDER_SITE_OTHER): Payer: Self-pay | Admitting: Orthopedic Surgery

## 2018-04-05 ENCOUNTER — Encounter (INDEPENDENT_AMBULATORY_CARE_PROVIDER_SITE_OTHER): Payer: Self-pay | Admitting: Orthopedic Surgery

## 2018-04-05 ENCOUNTER — Ambulatory Visit (INDEPENDENT_AMBULATORY_CARE_PROVIDER_SITE_OTHER): Payer: Medicare HMO | Admitting: Orthopedic Surgery

## 2018-04-05 DIAGNOSIS — M75101 Unspecified rotator cuff tear or rupture of right shoulder, not specified as traumatic: Secondary | ICD-10-CM

## 2018-04-05 MED ORDER — TRAMADOL HCL 50 MG PO TABS: ORAL_TABLET | ORAL | 0 refills | Status: DC

## 2018-04-05 NOTE — Telephone Encounter (Signed)
Patient has an appt today but would like to go ahead and request rx refill on tramadol. [facility] [address] # [phone]

## 2018-04-05 NOTE — Telephone Encounter (Signed)
IC s/w patient advised would need to wait until his appt this afternoon because Dr August Saucerean not in office this morning and would not be addressed until later this afternoon anyway.

## 2018-04-06 ENCOUNTER — Encounter (INDEPENDENT_AMBULATORY_CARE_PROVIDER_SITE_OTHER): Payer: Self-pay | Admitting: Orthopedic Surgery

## 2018-04-06 NOTE — Progress Notes (Signed)
Post-Op Visit Note   Patient: Philip Bates           Date of Birth: 09/20/1961           MRN: [id number] Visit Date: 04/05/2018 PCP: SwazilandJordan, Betty G, MD   Assessment & Plan:  Chief Complaint:  Chief Complaint  Patient presents with  . Right Shoulder - Routine Post Op   Visit Diagnoses:  1. Tear of right rotator cuff, unspecified tear extent, unspecified whether traumatic     Plan: [name] is a patient who is now 2 months out massive right shoulder rotator cuff tear repair.  He has not started therapy yet but is been doing some exercises at the gym.  On examination he has pretty good range of motion and not is much coarseness and grinding with passive range of motion but I would expect if this cuff tear had failed.  He is a smoker.  He does have forward flexion and abduction both above 90 degrees.  Plan at this time is for him not to visit the gym at all for the next month.  He needs to let this thing heal in.  His functional range of motion now is excellent.  We are trying to delay the need for reverse shoulder replacement in this patient and he needs to not do any type of lifting or resistive exercises for 4 weeks.  I will see him back in 4 weeks for clinical recheck and likely let him initiate some strengthening at that time.  Follow-Up Instructions: Return in about 1 month (around 05/03/2018).   Orders:  No orders of the defined types were placed in this encounter.  Meds ordered this encounter  Medications  . traMADol (ULTRAM) 50 MG tablet    Sig: 1 po q d to bid prn pain    Dispense:  20 tablet    Refill:  0    Not to exceed 5 additional fills before 07/03/2018.    Imaging: No results found.  PMFS History: Patient Active Problem List   Diagnosis Date Noted  . Erectile dysfunction 02/22/2018  . Cervical spondylosis with radiculopathy 08/11/2017  . Chronic right shoulder pain 06/04/2017  . Radiculopathy, cervical 06/01/2017  . Tobacco use disorder 02/10/2017  .  Insomnia 02/10/2017  . Cerebral palsy (HCC) 02/10/2017  . Chronic lower back pain 02/10/2017   Past Medical History:  Diagnosis Date  . Arthritis   . Cerebral palsy (HCC)   . Cervical radiculopathy   . Spondylosis of cervical spine     Family History  Problem Relation Age of Onset  . Cancer Father        esophagus  . Diabetes Brother   . Diabetes Paternal Uncle     Past Surgical History:  Procedure Laterality Date  . ANTERIOR CERVICAL DECOMPRESSION/DISCECTOMY FUSION 4 LEVELS N/A 08/11/2017   Procedure: Cervical three to Cervical seven Anterior Cervical discectomy with fusion/plate fixation;  Surgeon: Ditty, Loura HaltBenjamin Jared, MD;  [facility];  Service: Neurosurgery;  Laterality: N/A;  . BACK SURGERY    . HERNIA REPAIR    . LEG SURGERY    . SHOULDER ARTHROSCOPY WITH ROTATOR CUFF REPAIR AND SUBACROMIAL DECOMPRESSION Right 02/02/2018   Procedure: RIGHT SHOULDER ARTHROSCOPY WITH SUBACROMIAL DECOMPRESSION, MINI-OPEN ROTATOR CUFF REPAIR;  Surgeon: Cammy Copaean, Greysen Swanton Scott, MD;  [facility];  Service: Orthopedics;  Laterality: Right;  . SHOULDER SURGERY     Social History   Occupational History  . Not on file  Tobacco Use  .  Smoking status: Current Every Day Smoker    Packs/day: 1.00    Types: Cigarettes  . Smokeless tobacco: Never Used  Substance and Sexual Activity  . Alcohol use: No  . Drug use: No  . Sexual activity: Yes

## 2018-04-13 ENCOUNTER — Telehealth (INDEPENDENT_AMBULATORY_CARE_PROVIDER_SITE_OTHER): Payer: Self-pay | Admitting: Orthopedic Surgery

## 2018-04-13 MED ORDER — TRAMADOL HCL 50 MG PO TABS: ORAL_TABLET | ORAL | 0 refills | Status: DC

## 2018-04-13 NOTE — Telephone Encounter (Signed)
Ok to rf? Just received rx for ultram on 07/01

## 2018-04-13 NOTE — Telephone Encounter (Signed)
Called to pharmacy. LM advising done.

## 2018-04-13 NOTE — Telephone Encounter (Signed)
Patient requesting rx refill on [facility] [address] Patients # [phone]

## 2018-04-13 NOTE — Telephone Encounter (Signed)
Ok to rf once he is done with med by the book

## 2018-04-22 ENCOUNTER — Telehealth (INDEPENDENT_AMBULATORY_CARE_PROVIDER_SITE_OTHER): Payer: Self-pay | Admitting: Orthopedic Surgery

## 2018-04-22 NOTE — Telephone Encounter (Signed)
Patient called to get a refill on Tramadol.Please call patient to advise

## 2018-04-22 NOTE — Telephone Encounter (Signed)
Ok to rf? 

## 2018-04-23 MED ORDER — TRAMADOL HCL 50 MG PO TABS: ORAL_TABLET | ORAL | 0 refills | Status: DC

## 2018-04-23 NOTE — Telephone Encounter (Signed)
Called to pharmacy LM for patient advising done.  

## 2018-04-23 NOTE — Telephone Encounter (Signed)
y

## 2018-04-25 ENCOUNTER — Other Ambulatory Visit: Payer: Self-pay | Admitting: Family Medicine

## 2018-04-25 DIAGNOSIS — N529 Male erectile dysfunction, unspecified: Secondary | ICD-10-CM

## 2018-04-30 ENCOUNTER — Encounter (HOSPITAL_COMMUNITY): Payer: Self-pay | Admitting: Emergency Medicine

## 2018-04-30 ENCOUNTER — Emergency Department (HOSPITAL_COMMUNITY)
Admission: EM | Admit: 2018-04-30 | Discharge: 2018-04-30 | Disposition: A | Discharge status: Home / Self Care | Payer: Medicare HMO | Attending: Emergency Medicine | Admitting: Emergency Medicine

## 2018-04-30 ENCOUNTER — Other Ambulatory Visit: Payer: Self-pay

## 2018-04-30 DIAGNOSIS — M4302 Spondylolysis, cervical region: Secondary | ICD-10-CM | POA: Insufficient documentation

## 2018-04-30 DIAGNOSIS — G809 Cerebral palsy, unspecified: Secondary | ICD-10-CM | POA: Diagnosis not present

## 2018-04-30 DIAGNOSIS — G8929 Other chronic pain: Secondary | ICD-10-CM

## 2018-04-30 DIAGNOSIS — Z79899 Other long term (current) drug therapy: Secondary | ICD-10-CM | POA: Insufficient documentation

## 2018-04-30 DIAGNOSIS — F17210 Nicotine dependence, cigarettes, uncomplicated: Secondary | ICD-10-CM | POA: Diagnosis not present

## 2018-04-30 DIAGNOSIS — M545 Low back pain: Secondary | ICD-10-CM | POA: Diagnosis present

## 2018-04-30 DIAGNOSIS — M419 Scoliosis, unspecified: Secondary | ICD-10-CM | POA: Diagnosis not present

## 2018-04-30 HISTORY — DX: Scoliosis, unspecified: M41.9

## 2018-04-30 MED ORDER — METHOCARBAMOL 500 MG PO TABS: 500.0000 mg | ORAL_TABLET | Freq: Two times a day (BID) | ORAL | 0 refills | Status: DC

## 2018-04-30 MED ORDER — IBUPROFEN 600 MG PO TABS: 600.0000 mg | ORAL_TABLET | Freq: Four times a day (QID) | ORAL | 0 refills | Status: DC | PRN

## 2018-04-30 NOTE — ED Provider Notes (Signed)
[facility]Y DEPARTMENT Provider Note   CSN: [id number] Arrival date & time: 04/30/18  4098     History   Chief Complaint Chief Complaint  Patient presents with  . Back Pain    HPI Philip Bates is a 57 y.o. male.  The history is provided by the patient and the spouse. No language interpreter was used.  Back Pain   Pertinent negatives include no fever and no numbness.     57 year old male with history of scoliosis, spondylolysis of the cervical spine, cerebral palsy and arthritis presenting for evaluation of back pain.  Patient report for the past 3 to 4 days he has had pain primarily to his lower back and his left hip.  Pain is described as a sharp achy throbbing sensation, worsening with ambulation and standing when he lays a certain position.  Pain does not radiate down his leg.  Rates pain is moderate to severe, without any associated fever, chills, lightheadedness, dizziness, dysuria, bowel bladder incontinence or saddle anesthesia.  No history of active cancer or IV drug use.  Denies any recent strenuous activities or heavy lifting.  He is on disability.  He report right shoulder surgery recently and currently taking tramadol for that.  States it did not help with his pain.  He did report some strong urine odor but denies any burning sensation on urination or have a urinary frequency urgency.  No hematuria.  Pain does not radiates down his leg.  Past Medical History:  Diagnosis Date  . Arthritis   . Cerebral palsy (HCC)   . Cervical radiculopathy   . Scoliosis   . Spondylosis of cervical spine     Patient Active Problem List   Diagnosis Date Noted  . Erectile dysfunction 02/22/2018  . Cervical spondylosis with radiculopathy 08/11/2017  . Chronic right shoulder pain 06/04/2017  . Radiculopathy, cervical 06/01/2017  . Tobacco use disorder 02/10/2017  . Insomnia 02/10/2017  . Cerebral palsy (HCC) 02/10/2017  . Chronic lower back pain 02/10/2017     Past Surgical History:  Procedure Laterality Date  . ANTERIOR CERVICAL DECOMPRESSION/DISCECTOMY FUSION 4 LEVELS N/A 08/11/2017   Procedure: Cervical three to Cervical seven Anterior Cervical discectomy with fusion/plate fixation;  Surgeon: Ditty, Loura Halt, MD;  Location[facility];  Service: Neurosurgery;  Laterality: N/A;  . BACK SURGERY    . HERNIA REPAIR    . LEG SURGERY    . SHOULDER ARTHROSCOPY WITH ROTATOR CUFF REPAIR AND SUBACROMIAL DECOMPRESSION Right 02/02/2018   Procedure: RIGHT SHOULDER ARTHROSCOPY WITH SUBACROMIAL DECOMPRESSION, MINI-OPEN ROTATOR CUFF REPAIR;  Surgeon: Cammy Copa, MD;  Location: [facility] OR;  Service: Orthopedics;  Laterality: Right;  . SHOULDER SURGERY          Home Medications    Prior to Admission medications   Medication Sig Start Date End Date Taking? Authorizing Provider  ciprofloxacin (CIPRO) 250 MG tablet Take 1 tablet (250 mg total) by mouth 2 (two) times daily. 02/22/18   [place], Betty G, MD  diazepam (VALIUM) 5 MG tablet Take 1 tablet (5 mg total) by mouth daily as needed for muscle spasms (for insomnia and cervical muscle spasm.). 02/23/18   [place], Betty G, MD  gabapentin (NEURONTIN) 300 MG capsule TAKE 1 CAPSULE BY MOUTH TWICE A DAY 03/15/18   Cammy Copa, MD  HYDROcodone-acetaminophen (NORCO/VICODIN) 5-325 MG tablet Take 1 tablet by mouth every 12 (twelve) hours as needed for moderate pain. 03/12/18   Cammy Copa, MD  ondansetron [facility]) 4  MG tablet Take 1 tablet (4 mg total) by mouth every 8 (eight) hours as needed for nausea or vomiting. 02/12/18   Ginger Carne, MD  oxyCODONE (OXY IR/ROXICODONE) 5 MG immediate release tablet 1 po q 8-12 hrs prn pain 03/02/18   Cammy Copa, MD  sildenafil (REVATIO) 20 MG tablet TAKE 2 TABLETS (40 MG TOTAL) BY MOUTH DAILY AS NEEDED. 04/26/18   [place], Betty G, MD  traMADol Janean Sark) 50 MG tablet 1 po q d to bid prn pain 04/23/18   Cammy Copa, MD    Family History Family  History  Problem Relation Age of Onset  . Cancer Father        esophagus  . Diabetes Brother   . Diabetes Paternal Uncle     Social History Social History   Tobacco Use  . Smoking status: Current Every Day Smoker    Packs/day: 1.00    Types: Cigarettes  . Smokeless tobacco: Never Used  Substance Use Topics  . Alcohol use: No  . Drug use: No     Allergies   Naproxen and Flexeril [cyclobenzaprine]   Review of Systems Review of Systems  Constitutional: Negative for fever.  Musculoskeletal: Positive for back pain.  Neurological: Negative for numbness.     Physical Exam Updated Vital Signs BP 112/75 (BP Location: Right Arm)   Pulse 67   Temp 98.2 F (36.8 C) (Oral)   Resp 16   SpO2 100%   Physical Exam  Constitutional: He appears well-developed and well-nourished. No distress.  HENT:  Head: Atraumatic.  Eyes: Conjunctivae are normal.  Neck: Neck supple.  Abdominal: Soft. He exhibits no distension. There is no tenderness.  Musculoskeletal: He exhibits tenderness (Tenderness along lumbar spine without crepitus or step-off.  Normal appearing surgical scar noted.).  Tenderness to left lumbar paraspinal muscle on palpation.  Left hip with normal range of motion including flexion extension abduction abduction.  Negative straight leg raise.  Neurological: He is alert.  Skin: No rash noted.  Psychiatric: He has a normal mood and affect.  Nursing note and vitals reviewed.    ED Treatments / Results  Labs (all labs ordered are listed, but only abnormal results are displayed) Labs Reviewed - No data to display  EKG None  Radiology No results found.  Procedures Procedures (including critical care time)  Medications Ordered in ED Medications - No data to display   Initial Impression / Assessment and Plan / ED Course  I have reviewed the triage vital signs and the nursing notes.  Pertinent labs & imaging results that were available during my care of the  patient were reviewed by me and considered in my medical decision making (see chart for details).     BP 112/75 (BP Location: Right Arm)   Pulse 67   Temp 98.2 F (36.8 C) (Oral)   Resp 16   SpO2 100%    Final Clinical Impressions(s) / ED Diagnoses   Final diagnoses:  Chronic low back pain without sciatica, unspecified back pain laterality    ED Discharge Orders    None     No red flag s/s of low back pain. Patient was counseled on back pain precautions and told to do activity as tolerated but do not lift, push, or pull heavy objects more than 10 pounds for the next week. Patient counseled to use ice or heat on back for no longer than 15 minutes every hour.   Patient prescribed muscle relaxer and counseled on proper  use of muscle relaxant medication.  Patient prescribed NSAIDS.   Patient urged to follow-up with PCP if pain does not improve with treatment and rest or if pain becomes recurrent. Urged to return with worsening severe pain, loss of bowel or bladder control, trouble walking.  The patient verbalizes understanding and agrees with the plan.    Fayrene Helperran, Dugan Vanhoesen, PA-C 04/30/18 1025    Eber HongMiller, Brian, MD 05/01/18 [phone]

## 2018-04-30 NOTE — ED Triage Notes (Signed)
Patient complains of lumber back pain x3 days. Denies recent injury. Reports history of spinal fusion in same area of pain in 2015. Patient alert, oriented, and in no apparent distress at this time.

## 2018-05-03 ENCOUNTER — Ambulatory Visit (INDEPENDENT_AMBULATORY_CARE_PROVIDER_SITE_OTHER): Payer: Medicare HMO | Admitting: Orthopedic Surgery

## 2018-05-03 ENCOUNTER — Encounter (INDEPENDENT_AMBULATORY_CARE_PROVIDER_SITE_OTHER): Payer: Self-pay | Admitting: Orthopedic Surgery

## 2018-05-03 DIAGNOSIS — M75101 Unspecified rotator cuff tear or rupture of right shoulder, not specified as traumatic: Secondary | ICD-10-CM

## 2018-05-03 MED ORDER — TRAMADOL HCL 50 MG PO TABS: ORAL_TABLET | ORAL | 0 refills | Status: DC

## 2018-05-03 NOTE — Progress Notes (Signed)
   Post-Op Visit Note   Patient: Philip [facility]           Date of Birth: 10/29/1960           MRN: [id number] Visit Date: 05/03/2018 PCP: [place]il[place]Jordan, Betty G, MD   Assessment & Plan:  Chief Complaint:  Chief Complaint  Patient presents with  . Right Shoulder - Routine Post Op   Visit Diagnoses:  1. Tear of right rotator cuff, unspecified tear extent, unspecified whether traumatic     Plan: Philip Bates is now 3 months out right shoulder arthroscopy with mini open rotator cuff tear repair.  He is doing better.  Making slow progress.  Taking Ultram as needed.  On examination he has good active and passive shoulder motion.  The cuff repair feels imperfect but improved.  I am in a refill his Ultram one time and then see him back as needed.  I did caution him to be very careful with any type of shoulder lifting.  I would prefer that he not lift weights with the shoulder for at least 3 more months  Follow-Up Instructions: Return if symptoms worsen or fail to improve.   Orders:  No orders of the defined types were placed in this encounter.  Meds ordered this encounter  Medications  . traMADol (ULTRAM) 50 MG tablet    Sig: 1 po q d prn pain    Dispense:  25 tablet    Refill:  0    Imaging: No results found.  PMFS History: Patient Active Problem List   Diagnosis Date Noted  . Erectile dysfunction 02/22/2018  . Cervical spondylosis with radiculopathy 08/11/2017  . Chronic right shoulder pain 06/04/2017  . Radiculopathy, cervical 06/01/2017  . Tobacco use disorder 02/10/2017  . Insomnia 02/10/2017  . Cerebral palsy (HCC) 02/10/2017  . Chronic lower back pain 02/10/2017   Past Medical History:  Diagnosis Date  . Arthritis   . Cerebral palsy (HCC)   . Cervical radiculopathy   . Scoliosis   . Spondylosis of cervical spine     Family History  Problem Relation Age of Onset  . Cancer Father        esophagus  . Diabetes Brother   . Diabetes Paternal Uncle     Past Surgical History:    Procedure Laterality Date  . ANTERIOR CERVICAL DECOMPRESSION/DISCECTOMY FUSION 4 LEVELS N/A 08/11/2017   Procedure: Cervical three to Cervical seven Anterior Cervical discectomy with fusion/plate fixation;  Surgeon: Ditty, Loura HaltBenjamin Jared, MD;  [facility];  Service: Neurosurgery;  Laterality: N/A;  . BACK SURGERY    . HERNIA REPAIR    . LEG SURGERY    . SHOULDER ARTHROSCOPY WITH ROTATOR CUFF REPAIR AND SUBACROMIAL DECOMPRESSION Right 02/02/2018   Procedure: RIGHT SHOULDER ARTHROSCOPY WITH SUBACROMIAL DECOMPRESSION, MINI-OPEN ROTATOR CUFF REPAIR;  Surgeon: [facility], Philip Simek Scott, MD;  [facility];  Service: Orthopedics;  Laterality: Right;  . SHOULDER SURGERY     Social History   Occupational History  . Not on file  Tobacco Use  . Smoking status: Current Every Day Smoker    Packs/day: 1.00    Types: Cigarettes  . Smokeless tobacco: Never Used  Substance and Sexual Activity  . Alcohol use: No  . Drug use: No  . Sexual activity: Yes

## 2018-05-12 ENCOUNTER — Telehealth (INDEPENDENT_AMBULATORY_CARE_PROVIDER_SITE_OTHER): Payer: Self-pay | Admitting: Orthopedic Surgery

## 2018-05-12 NOTE — Telephone Encounter (Signed)
Patient called stating that the prescription from July 19, Dr. August Saucerean wrote it for 1 a day, but he was taking 2 per day.  He needs the prescription authorized so that he can get it filled.  CB#[phone].  Thank you.

## 2018-05-12 NOTE — Telephone Encounter (Signed)
IC s/w patient and advised rx written per Dr August Saucerean and could not authorize since last OV note stated it was his last rx. Advised would have to wait until time to fill could not authorize an early refill.

## 2018-05-19 ENCOUNTER — Other Ambulatory Visit: Payer: Self-pay | Admitting: Family Medicine

## 2018-05-19 DIAGNOSIS — G4700 Insomnia, unspecified: Secondary | ICD-10-CM

## 2018-05-19 DIAGNOSIS — G809 Cerebral palsy, unspecified: Secondary | ICD-10-CM

## 2018-05-19 MED ORDER — DIAZEPAM 5 MG PO TABS: 5.0000 mg | ORAL_TABLET | Freq: Every day | ORAL | 1 refills | Status: DC | PRN

## 2018-05-19 NOTE — Telephone Encounter (Signed)
Copied from CRM [phone]. Topic: Quick Communication - Rx Refill/Question >> May 19, 2018  8:13 AM Darletta MollLander, Lumin L wrote: Medication: diazepam (VALIUM) 5 MG tablet   Has the patient contacted their pharmacy? Yes.   (Agent: If no, request that the patient contact the pharmacy for the refill.) (Agent: If yes, when and what did the pharmacy advise?)  Preferred Pharmacy (with phone number or street name[id number] [place]- [place],  - [address] [place] [address] Phone: [phone] Fax: [phone]    Agent: Please be advised that RX refills may take up to 3 business days. We ask that you follow-up with your pharmacy.

## 2018-05-19 NOTE — Telephone Encounter (Signed)
Rx refill request: diazepam 5 mg       Last filled:02/23/18  LOV: 02/22/18  PCP: [place]Jordan  Pharmacy: verified

## 2018-05-21 ENCOUNTER — Telehealth (INDEPENDENT_AMBULATORY_CARE_PROVIDER_SITE_OTHER): Payer: Self-pay | Admitting: Orthopedic Surgery

## 2018-05-21 NOTE — Telephone Encounter (Signed)
Ok to return to work with these restrictions?

## 2018-05-21 NOTE — Telephone Encounter (Signed)
Patient needing letter stating he can return to work w/limitations of not lifting no more than 10lbs.  Please call patient to advise [phone]

## 2018-05-22 NOTE — Telephone Encounter (Signed)
y

## 2018-05-24 NOTE — Telephone Encounter (Signed)
Note written. Called and LM for patient advising done and to let us know if he wanted copy mailed or fax it for him.

## 2018-06-16 ENCOUNTER — Other Ambulatory Visit (INDEPENDENT_AMBULATORY_CARE_PROVIDER_SITE_OTHER): Payer: Self-pay | Admitting: Orthopedic Surgery

## 2018-06-16 ENCOUNTER — Telehealth (INDEPENDENT_AMBULATORY_CARE_PROVIDER_SITE_OTHER): Payer: Self-pay | Admitting: Orthopedic Surgery

## 2018-06-16 NOTE — Telephone Encounter (Signed)
Called to pharmacy. LM advising called to pharmacy

## 2018-06-16 NOTE — Telephone Encounter (Signed)
y

## 2018-06-16 NOTE — Telephone Encounter (Signed)
Patient request refill on tramadol[facility] [address] Patients # [phone]

## 2018-06-16 NOTE — Telephone Encounter (Signed)
Ok to rf? 

## 2018-07-05 ENCOUNTER — Other Ambulatory Visit: Payer: Self-pay | Admitting: Neurosurgery

## 2018-07-05 DIAGNOSIS — M4722 Other spondylosis with radiculopathy, cervical region: Secondary | ICD-10-CM

## 2018-07-07 ENCOUNTER — Other Ambulatory Visit (INDEPENDENT_AMBULATORY_CARE_PROVIDER_SITE_OTHER): Payer: Self-pay | Admitting: Orthopedic Surgery

## 2018-07-07 NOTE — Telephone Encounter (Signed)
Ok to rf? 

## 2018-07-08 NOTE — Telephone Encounter (Signed)
y

## 2018-07-08 NOTE — Telephone Encounter (Signed)
Called to pharmacy. Per Dr August Saucer this will be patients last rx. Needs to use tylenol/NSAIDS after this.

## 2018-07-09 ENCOUNTER — Ambulatory Visit
Admission: RE | Admit: 2018-07-09 | Discharge: 2018-07-09 | Disposition: A | Discharge status: Home / Self Care | Payer: Medicare HMO | Source: Ambulatory Visit | Attending: Neurosurgery | Admitting: Neurosurgery

## 2018-07-09 DIAGNOSIS — M4722 Other spondylosis with radiculopathy, cervical region: Secondary | ICD-10-CM

## 2018-07-13 ENCOUNTER — Other Ambulatory Visit: Payer: Self-pay | Admitting: Family Medicine

## 2018-07-13 DIAGNOSIS — G4700 Insomnia, unspecified: Secondary | ICD-10-CM

## 2018-07-13 DIAGNOSIS — G809 Cerebral palsy, unspecified: Secondary | ICD-10-CM

## 2018-07-29 ENCOUNTER — Other Ambulatory Visit: Payer: Self-pay | Admitting: Neurosurgery

## 2018-08-05 NOTE — Pre-Procedure Instructions (Signed)
Philip Bates  08/05/2018      [id number] [place], [place] - [address] [place] [address] Phone: [phone] Fax: [phone]  [facility] [address] [place], [place] - [address] [place] [address] Phone: [phone] Fax: [phone]    Your procedure is scheduled on Friday, November 8th .  Report to [facility] Admitting at 5:30 A.M.  Call this number if you have problems the morning of surgery:  [phone]   Remember:  Do not eat or drink after midnight.    Take these medicines the morning of surgery with A SIP OF WATER  traMADol (ULTRAM)- if needed acetaminophen (TYLENOL)-if needed  7 days prior to surgery STOP taking any Aspirin(unless otherwise instructed by your surgeon), Aleve, Naproxen, Ibuprofen, Motrin, Advil, Goody's, BC's, all herbal medications, fish oil, and all vitamins     Do not wear jewelry.  Do not wear lotions, powders, or colognes, or deodorant.  Men may shave face and neck.  Do not bring valuables to the hospital.  [facility] is not responsible for any belongings or valuables.  Eyeglasses, contacts, hearing aids, body piercing, dentures or bridgework may not be worn into surgery.  Leave your suitcase in the car.  After surgery it may be brought to your room.  For patients admitted to the hospital, discharge time will be determined by your treatment team.  Patients discharged the day of surgery will not be allowed to drive home.   Special instructions:    [place]- Preparing For Surgery  Before surgery, you can play an important role. Because skin is not sterile, your skin needs to be as free of germs as possible. You can reduce the number of germs on your skin by washing with CHG (chlorahexidine gluconate) Soap before surgery.  CHG is an antiseptic cleaner which kills germs and bonds with the skin to continue killing germs even  after washing.    Oral Hygiene is also important to reduce your risk of infection.  Remember - BRUSH YOUR TEETH THE MORNING OF SURGERY WITH YOUR REGULAR TOOTHPASTE  Please do not use if you have an allergy to CHG or antibacterial soaps. If your skin becomes reddened/irritated stop using the CHG.  Do not shave (including legs and underarms) for at least 48 hours prior to first CHG shower. It is OK to shave your face.  Please follow these instructions carefully.   1. Shower the NIGHT BEFORE SURGERY and the MORNING OF SURGERY with CHG.   2. If you chose to wash your hair, wash your hair first as usual with your normal shampoo.  3. After you shampoo, rinse your hair and body thoroughly to remove the shampoo.  4. Use CHG as you would any other liquid soap. You can apply CHG directly to the skin and wash gently with a scrungie or a clean washcloth.   5. Apply the CHG Soap to your body ONLY FROM THE NECK DOWN.  Do not use on open wounds or open sores. Avoid contact with your eyes, ears, mouth and genitals (private parts). Wash Face and genitals (private parts)  with your normal soap.  6. Wash thoroughly, paying special attention to the area where your surgery will be performed.  7. Thoroughly rinse your body with warm water from the neck down.  8. DO NOT shower/wash with your normal soap after using and rinsing off the CHG Soap.  9. Philip Bates  yourself dry with a CLEAN TOWEL.  10. Wear CLEAN PAJAMAS to bed the night before surgery, wear comfortable clothes the morning of surgery  11. Place CLEAN SHEETS on your bed the night of your first shower and DO NOT SLEEP WITH PETS.    Day of Surgery:  Do not apply any deodorants/lotions.  Please wear clean clothes to the hospital/surgery center.   Remember to brush your teeth WITH YOUR REGULAR TOOTHPASTE.   Please read over the following fact sheets that you were given.

## 2018-08-06 ENCOUNTER — Encounter (HOSPITAL_COMMUNITY): Payer: Self-pay

## 2018-08-06 ENCOUNTER — Encounter (HOSPITAL_COMMUNITY)
Admission: RE | Admit: 2018-08-06 | Discharge: 2018-08-06 | Disposition: A | Discharge status: Home / Self Care | Payer: Medicare HMO | Source: Ambulatory Visit | Attending: Neurosurgery | Admitting: Neurosurgery

## 2018-08-06 ENCOUNTER — Other Ambulatory Visit: Payer: Self-pay

## 2018-08-06 DIAGNOSIS — M960 Pseudarthrosis after fusion or arthrodesis: Secondary | ICD-10-CM | POA: Diagnosis not present

## 2018-08-06 DIAGNOSIS — Y838 Other surgical procedures as the cause of abnormal reaction of the patient, or of later complication, without mention of misadventure at the time of the procedure: Secondary | ICD-10-CM | POA: Insufficient documentation

## 2018-08-06 DIAGNOSIS — Z01818 Encounter for other preprocedural examination: Secondary | ICD-10-CM | POA: Insufficient documentation

## 2018-08-06 HISTORY — DX: Other specified postprocedural states: Z98.890

## 2018-08-06 HISTORY — DX: Personal history of other diseases of the digestive system: Z87.19

## 2018-08-06 LAB — TYPE AND SCREEN
ABO/RH(D): B POS
ANTIBODY SCREEN: NEGATIVE

## 2018-08-06 LAB — CBC
HEMATOCRIT: 41.8 % (ref 39.0–52.0)
Hemoglobin: 13.3 g/dL (ref 13.0–17.0)
MCH: 25.7 pg — AB (ref 26.0–34.0)
MCHC: 31.8 g/dL (ref 30.0–36.0)
MCV: 80.9 fL (ref 80.0–100.0)
Platelets: 262 10*3/uL (ref 150–400)
RBC: 5.17 MIL/uL (ref 4.22–5.81)
RDW: 14.3 % (ref 11.5–15.5)
WBC: 4.1 10*3/uL (ref 4.0–10.5)
nRBC: 0 % (ref 0.0–0.2)

## 2018-08-06 LAB — SURGICAL PCR SCREEN
MRSA, PCR: NEGATIVE
STAPHYLOCOCCUS AUREUS: POSITIVE — AB

## 2018-08-06 NOTE — Progress Notes (Signed)
PCP - Dr. Betty [place] Cardiologist - denies  Chest x-ray - 10/15/17 EKG - 10/15/17 Stress Test - 01/22/16 ECHO - denies Cardiac Cath - denies  Sleep Study - denies Aspirin Instructions:N/A  Anesthesia review: Previous note from [place], [place]. Spoke with Revonda Standard, chart reviewed during PAT appointment. No new orders.   Patient denies shortness of breath, fever, cough and chest pain at PAT appointment   Patient verbalized understanding of instructions that were given to them at the PAT appointment. Patient was also instructed that they will need to review over the PAT instructions again at home before surgery.

## 2018-08-09 ENCOUNTER — Other Ambulatory Visit: Payer: Self-pay | Admitting: Neurosurgery

## 2018-08-09 NOTE — Progress Notes (Signed)
Anesthesia Chart Review:  Case:  [id number] Date/Time:  08/13/18 0715   Procedure:  POSTERIOR CERVICAL FUSION WITH LATERAL MASS FIXATION CERVICAL 3- CERVICAL 7 (N/A ) - POSTERIOR CERVICAL FUSION WITH LATERAL MASS FIXATION CERVICAL 3- CERVICAL 7   Anesthesia type:  General   Pre-op diagnosis:  PSEUDOARTHROSIS OF CERVICAL SPINE   [facility]  [facility] ROOM 21 / [facility] OR   Surgeon:  Lisbeth Renshaw, MD      DISCUSSION: Patient is a 57 year old male scheduled for the above procedure. History includes cerebral palsy, scoliosis, smoking. S/p C3-7 ACDF 08/11/17 and s/p right shoulder rotator cuff repair 02/02/18.   He tolerated two surgeries since 11/218. the past year. He denied SOB, cough, fever, and chest pain at PAT. If no acute changes then I anticipate that he can proceed as planned.   VS: BP 114/79   Pulse 71   Temp 36.6 C   Resp 20   Ht 5\' 4"  (1.626 m)   Wt 51.3 kg   SpO2 100%   BMI 19.40 kg/m   PROVIDERS: [place], Betty G, MD is PCP    LABS: Labs reviewed: Acceptable for surgery. (all labs ordered are listed, but only abnormal results are displayed)  Labs Reviewed  SURGICAL PCR SCREEN - Abnormal; Notable for the following components:      Result Value   Staphylococcus aureus POSITIVE (*)    All other components within normal limits  CBC - Abnormal; Notable for the following components:   MCH 25.7 (*)    All other components within normal limits  TYPE AND SCREEN    IMAGES: CT c-spine 07/09/18: IMPRESSION: 1. Interval C3-C7 ACDF with lucency about multiple screws suggesting loosening and suspected slight retraction of the C3 screws. Fractured left C7 screw. Minimal bridging bone across the C3-4 and C4-5 disc spaces without evidence of osseous fusion at C5-6 and C6-7. 2. Mild-to-moderate residual osseous neural foraminal narrowing at the fused levels as above. 3. Chronic severe right neural foraminal stenosis at C2-3 and moderate right neural foraminal stenosis at  C7-T1.   EKG: 10/15/17: NSR.    CV: Nuclear stress test 01/22/16 [facility] in [place], scanned under Media tab, Correspondence 08/11/17):  Conclusions: 1. There is a small, mild, reversible defect involving the mid to apical anterior wall. There is an additional small, mild, reversible defect involving the mid to basal inferoseptal wall. There is significant gut uptake which interferes with the study interpretation, but these may represent areas of mild ischemia. 2. Normal LV systolic function. EF 60%. 3. Resting ECG: Sinus bradycardia. Stress ECG: No abnormal ST/T wave changes. 4. Abnormal but low risk study.   Past Medical History:  Diagnosis Date  . Arthritis   . Cerebral palsy (HCC)   . Cervical radiculopathy   . H/O umbilical hernia repair 2001  . Scoliosis   . Spondylosis of cervical spine     Past Surgical History:  Procedure Laterality Date  . ANTERIOR CERVICAL DECOMPRESSION/DISCECTOMY FUSION 4 LEVELS N/A 08/11/2017   Procedure: Cervical three to Cervical seven Anterior Cervical discectomy with fusion/plate fixation;  Surgeon: Ditty, Loura Halt, MD;  Location: [facility] OR;  Service: Neurosurgery;  Laterality: N/A;  . BACK SURGERY    . HERNIA REPAIR    . LEG SURGERY    . SHOULDER ARTHROSCOPY WITH ROTATOR CUFF REPAIR AND SUBACROMIAL DECOMPRESSION Right 02/02/2018   Procedure: RIGHT SHOULDER ARTHROSCOPY WITH SUBACROMIAL DECOMPRESSION, MINI-OPEN ROTATOR CUFF REPAIR;  Surgeon: Cammy Copa, MD;  Location: [facility];  Service: Orthopedics;  Laterality: Right;  . SHOULDER SURGERY Right     MEDICATIONS: . acetaminophen (TYLENOL) 500 MG tablet  . ciprofloxacin (CIPRO) 250 MG tablet  . diazepam (VALIUM) 5 MG tablet  . gabapentin (NEURONTIN) 300 MG capsule  . HYDROcodone-acetaminophen (NORCO/VICODIN) 5-325 MG tablet  . ibuprofen (ADVIL,MOTRIN) 600 MG tablet  . methocarbamol (ROBAXIN) 500 MG tablet  . Multiple Vitamin (MULTIVITAMIN WITH MINERALS) TABS tablet  .  ondansetron (ZOFRAN) 4 MG tablet  . oxyCODONE (OXY IR/ROXICODONE) 5 MG immediate release tablet  . sildenafil (REVATIO) 20 MG tablet  . traMADol (ULTRAM) 50 MG tablet  . traMADol (ULTRAM) 50 MG tablet  . traMADol (ULTRAM) 50 MG tablet   No current facility-administered medications for this encounter.     Velna Ochs [facility] Short Stay Center/Anesthesiology Phone [phone] 08/09/2018 3:37 PM

## 2018-08-11 ENCOUNTER — Other Ambulatory Visit: Payer: Self-pay | Admitting: Neurosurgery

## 2018-08-12 ENCOUNTER — Inpatient Hospital Stay (HOSPITAL_COMMUNITY): Payer: Medicare HMO | Admitting: Vascular Surgery

## 2018-08-12 ENCOUNTER — Other Ambulatory Visit: Payer: Self-pay

## 2018-08-12 ENCOUNTER — Inpatient Hospital Stay (HOSPITAL_COMMUNITY)
Admission: RE | Admit: 2018-08-12 | Discharge: 2018-08-13 | DRG: 473 | Disposition: A | Discharge status: Home / Self Care | Payer: Medicare HMO | Source: Ambulatory Visit | Attending: Neurosurgery | Admitting: Neurosurgery

## 2018-08-12 ENCOUNTER — Encounter (HOSPITAL_COMMUNITY): Payer: Self-pay | Admitting: Certified Registered"

## 2018-08-12 ENCOUNTER — Encounter (HOSPITAL_COMMUNITY)
Admission: RE | Disposition: A | Discharge status: Home / Self Care | Payer: Self-pay | Source: Ambulatory Visit | Attending: Neurosurgery

## 2018-08-12 ENCOUNTER — Inpatient Hospital Stay (HOSPITAL_COMMUNITY): Payer: Medicare HMO | Admitting: Certified Registered"

## 2018-08-12 ENCOUNTER — Inpatient Hospital Stay (HOSPITAL_COMMUNITY): Payer: Medicare HMO

## 2018-08-12 DIAGNOSIS — F17210 Nicotine dependence, cigarettes, uncomplicated: Secondary | ICD-10-CM | POA: Diagnosis present

## 2018-08-12 DIAGNOSIS — Z79899 Other long term (current) drug therapy: Secondary | ICD-10-CM | POA: Diagnosis not present

## 2018-08-12 DIAGNOSIS — G809 Cerebral palsy, unspecified: Secondary | ICD-10-CM | POA: Diagnosis present

## 2018-08-12 DIAGNOSIS — Z419 Encounter for procedure for purposes other than remedying health state, unspecified: Secondary | ICD-10-CM

## 2018-08-12 DIAGNOSIS — Z886 Allergy status to analgesic agent status: Secondary | ICD-10-CM

## 2018-08-12 DIAGNOSIS — M960 Pseudarthrosis after fusion or arthrodesis: Principal | ICD-10-CM | POA: Diagnosis present

## 2018-08-12 DIAGNOSIS — Y832 Surgical operation with anastomosis, bypass or graft as the cause of abnormal reaction of the patient, or of later complication, without mention of misadventure at the time of the procedure: Secondary | ICD-10-CM | POA: Diagnosis present

## 2018-08-12 DIAGNOSIS — S129XXA Fracture of neck, unspecified, initial encounter: Secondary | ICD-10-CM | POA: Diagnosis present

## 2018-08-12 HISTORY — PX: POSTERIOR CERVICAL FUSION/FORAMINOTOMY: SHX5038

## 2018-08-12 SURGERY — POSTERIOR CERVICAL FUSION/FORAMINOTOMY LEVEL 4
Anesthesia: General

## 2018-08-12 MED ORDER — SODIUM CHLORIDE 0.9% FLUSH: 3.0000 mL | Freq: Two times a day (BID) | INTRAVENOUS | Status: DC

## 2018-08-12 MED ORDER — CHLORHEXIDINE GLUCONATE CLOTH 2 % EX PADS: 6.0000 | MEDICATED_PAD | Freq: Once | CUTANEOUS | Status: DC

## 2018-08-12 MED ORDER — METHOCARBAMOL 1000 MG/10ML IJ SOLN
500.0000 mg | Freq: Four times a day (QID) | INTRAVENOUS | Status: DC | PRN
  Filled 2018-08-12: qty 5

## 2018-08-12 MED ORDER — HYDROMORPHONE HCL 1 MG/ML IJ SOLN
INTRAMUSCULAR | Status: AC
  Filled 2018-08-12: qty 1

## 2018-08-12 MED ORDER — DEXAMETHASONE SODIUM PHOSPHATE 10 MG/ML IJ SOLN
INTRAMUSCULAR | Status: AC
  Filled 2018-08-12: qty 1

## 2018-08-12 MED ORDER — SENNA 8.6 MG PO TABS
1.0000 | ORAL_TABLET | Freq: Two times a day (BID) | ORAL | Status: DC
  Administered 2018-08-12: 8.6 mg via ORAL
  Filled 2018-08-12: qty 1

## 2018-08-12 MED ORDER — FLEET ENEMA 7-19 GM/118ML RE ENEM: 1.0000 | ENEMA | Freq: Once | RECTAL | Status: DC | PRN

## 2018-08-12 MED ORDER — BUPIVACAINE HCL (PF) 0.5 % IJ SOLN
INTRAMUSCULAR | Status: AC
  Filled 2018-08-12: qty 30

## 2018-08-12 MED ORDER — ROCURONIUM BROMIDE 10 MG/ML (PF) SYRINGE
PREFILLED_SYRINGE | INTRAVENOUS | Status: DC | PRN
  Administered 2018-08-12: 10 mg via INTRAVENOUS
  Administered 2018-08-12: 50 mg via INTRAVENOUS

## 2018-08-12 MED ORDER — SODIUM CHLORIDE 0.9 % IV SOLN
INTRAVENOUS | Status: DC | PRN
  Administered 2018-08-12: 40 ug/min via INTRAVENOUS

## 2018-08-12 MED ORDER — ACETAMINOPHEN 325 MG PO TABS: 650.0000 mg | ORAL_TABLET | ORAL | Status: DC | PRN

## 2018-08-12 MED ORDER — ONDANSETRON HCL 4 MG/2ML IJ SOLN: 4.0000 mg | Freq: Four times a day (QID) | INTRAMUSCULAR | Status: DC | PRN

## 2018-08-12 MED ORDER — DOCUSATE SODIUM 100 MG PO CAPS
100.0000 mg | ORAL_CAPSULE | Freq: Two times a day (BID) | ORAL | Status: DC
  Administered 2018-08-12: 100 mg via ORAL
  Filled 2018-08-12: qty 1

## 2018-08-12 MED ORDER — PROPOFOL 10 MG/ML IV BOLUS
INTRAVENOUS | Status: AC
  Filled 2018-08-12: qty 20

## 2018-08-12 MED ORDER — FENTANYL CITRATE (PF) 250 MCG/5ML IJ SOLN
INTRAMUSCULAR | Status: DC | PRN
  Administered 2018-08-12 (×2): 50 ug via INTRAVENOUS
  Administered 2018-08-12: 100 ug via INTRAVENOUS
  Administered 2018-08-12: 50 ug via INTRAVENOUS

## 2018-08-12 MED ORDER — SENNOSIDES-DOCUSATE SODIUM 8.6-50 MG PO TABS: 1.0000 | ORAL_TABLET | Freq: Every evening | ORAL | Status: DC | PRN

## 2018-08-12 MED ORDER — LIDOCAINE-EPINEPHRINE 1 %-1:100000 IJ SOLN
INTRAMUSCULAR | Status: DC | PRN
  Administered 2018-08-12: 5 mL

## 2018-08-12 MED ORDER — METHOCARBAMOL 500 MG PO TABS
500.0000 mg | ORAL_TABLET | Freq: Four times a day (QID) | ORAL | Status: DC | PRN
  Administered 2018-08-12 – 2018-08-13 (×2): 500 mg via ORAL
  Filled 2018-08-12: qty 1

## 2018-08-12 MED ORDER — LACTATED RINGERS IV SOLN
INTRAVENOUS | Status: DC
  Administered 2018-08-12 (×2): via INTRAVENOUS

## 2018-08-12 MED ORDER — SODIUM CHLORIDE 0.9 % IV SOLN: INTRAVENOUS | Status: DC

## 2018-08-12 MED ORDER — PHENYLEPHRINE 40 MCG/ML (10ML) SYRINGE FOR IV PUSH (FOR BLOOD PRESSURE SUPPORT)
PREFILLED_SYRINGE | INTRAVENOUS | Status: DC | PRN
  Administered 2018-08-12: 120 ug via INTRAVENOUS
  Administered 2018-08-12: 80 ug via INTRAVENOUS
  Administered 2018-08-12: 160 ug via INTRAVENOUS

## 2018-08-12 MED ORDER — CEFAZOLIN SODIUM-DEXTROSE 2-4 GM/100ML-% IV SOLN
2.0000 g | INTRAVENOUS | Status: AC
  Administered 2018-08-12: 2 g via INTRAVENOUS

## 2018-08-12 MED ORDER — ACETAMINOPHEN 500 MG PO TABS
1000.0000 mg | ORAL_TABLET | Freq: Four times a day (QID) | ORAL | Status: DC
  Administered 2018-08-12 – 2018-08-13 (×3): 1000 mg via ORAL
  Filled 2018-08-12 (×3): qty 2

## 2018-08-12 MED ORDER — BISACODYL 10 MG RE SUPP: 10.0000 mg | Freq: Every day | RECTAL | Status: DC | PRN

## 2018-08-12 MED ORDER — PHENOL 1.4 % MT LIQD: 1.0000 | OROMUCOSAL | Status: DC | PRN

## 2018-08-12 MED ORDER — SUGAMMADEX SODIUM 200 MG/2ML IV SOLN
INTRAVENOUS | Status: DC | PRN
  Administered 2018-08-12: 100 mg via INTRAVENOUS

## 2018-08-12 MED ORDER — THROMBIN 5000 UNITS EX SOLR
OROMUCOSAL | Status: DC | PRN
  Administered 2018-08-12: 13:00:00 via TOPICAL

## 2018-08-12 MED ORDER — BACITRACIN ZINC 500 UNIT/GM EX OINT
TOPICAL_OINTMENT | CUTANEOUS | Status: AC
  Filled 2018-08-12: qty 28.35

## 2018-08-12 MED ORDER — SODIUM CHLORIDE 0.9 % IV SOLN
INTRAVENOUS | Status: DC | PRN
  Administered 2018-08-12: 12:00:00

## 2018-08-12 MED ORDER — HYDROMORPHONE HCL 1 MG/ML IJ SOLN
0.5000 mg | INTRAMUSCULAR | Status: DC | PRN
  Administered 2018-08-13: 0.5 mg via INTRAVENOUS
  Filled 2018-08-12: qty 0.5

## 2018-08-12 MED ORDER — MIDAZOLAM HCL 5 MG/5ML IJ SOLN
INTRAMUSCULAR | Status: DC | PRN
  Administered 2018-08-12: 2 mg via INTRAVENOUS

## 2018-08-12 MED ORDER — SODIUM CHLORIDE (PF) 0.9 % IJ SOLN
INTRAMUSCULAR | Status: AC
  Filled 2018-08-12: qty 20

## 2018-08-12 MED ORDER — METHOCARBAMOL 500 MG PO TABS
ORAL_TABLET | ORAL | Status: AC
  Filled 2018-08-12: qty 1

## 2018-08-12 MED ORDER — BUPIVACAINE HCL (PF) 0.5 % IJ SOLN
INTRAMUSCULAR | Status: DC | PRN
  Administered 2018-08-12: 5 mL

## 2018-08-12 MED ORDER — FENTANYL CITRATE (PF) 250 MCG/5ML IJ SOLN
INTRAMUSCULAR | Status: AC
  Filled 2018-08-12: qty 5

## 2018-08-12 MED ORDER — SODIUM CHLORIDE 0.9% FLUSH: 3.0000 mL | INTRAVENOUS | Status: DC | PRN

## 2018-08-12 MED ORDER — ROCURONIUM BROMIDE 50 MG/5ML IV SOSY
PREFILLED_SYRINGE | INTRAVENOUS | Status: AC
  Filled 2018-08-12: qty 10

## 2018-08-12 MED ORDER — ONDANSETRON HCL 4 MG PO TABS: 4.0000 mg | ORAL_TABLET | Freq: Four times a day (QID) | ORAL | Status: DC | PRN

## 2018-08-12 MED ORDER — HYDROMORPHONE HCL 1 MG/ML IJ SOLN
0.2500 mg | INTRAMUSCULAR | Status: DC | PRN
  Administered 2018-08-12 (×5): 0.5 mg via INTRAVENOUS

## 2018-08-12 MED ORDER — HYDROCODONE-ACETAMINOPHEN 5-325 MG PO TABS: 1.0000 | ORAL_TABLET | ORAL | Status: DC | PRN

## 2018-08-12 MED ORDER — GABAPENTIN 300 MG PO CAPS
300.0000 mg | ORAL_CAPSULE | Freq: Three times a day (TID) | ORAL | Status: DC
  Administered 2018-08-12: 300 mg via ORAL
  Filled 2018-08-12: qty 1

## 2018-08-12 MED ORDER — LIDOCAINE 2% (20 MG/ML) 5 ML SYRINGE
INTRAMUSCULAR | Status: AC
  Filled 2018-08-12: qty 5

## 2018-08-12 MED ORDER — 0.9 % SODIUM CHLORIDE (POUR BTL) OPTIME
TOPICAL | Status: DC | PRN
  Administered 2018-08-12: 1000 mL

## 2018-08-12 MED ORDER — LIDOCAINE 2% (20 MG/ML) 5 ML SYRINGE
INTRAMUSCULAR | Status: DC | PRN
  Administered 2018-08-12: 40 mg via INTRAVENOUS

## 2018-08-12 MED ORDER — SODIUM CHLORIDE 0.9 % IV SOLN: 250.0000 mL | INTRAVENOUS | Status: DC

## 2018-08-12 MED ORDER — CEFAZOLIN SODIUM-DEXTROSE 2-4 GM/100ML-% IV SOLN
2.0000 g | Freq: Three times a day (TID) | INTRAVENOUS | Status: AC
  Administered 2018-08-12 – 2018-08-13 (×2): 2 g via INTRAVENOUS
  Filled 2018-08-12 (×2): qty 100

## 2018-08-12 MED ORDER — OXYCODONE HCL 5 MG PO TABS
5.0000 mg | ORAL_TABLET | ORAL | Status: DC | PRN
  Administered 2018-08-12 – 2018-08-13 (×4): 10 mg via ORAL
  Filled 2018-08-12 (×3): qty 2

## 2018-08-12 MED ORDER — SUCCINYLCHOLINE CHLORIDE 200 MG/10ML IV SOSY
PREFILLED_SYRINGE | INTRAVENOUS | Status: AC
  Filled 2018-08-12: qty 10

## 2018-08-12 MED ORDER — MIDAZOLAM HCL 2 MG/2ML IJ SOLN
INTRAMUSCULAR | Status: AC
  Filled 2018-08-12: qty 2

## 2018-08-12 MED ORDER — CEFAZOLIN SODIUM-DEXTROSE 2-4 GM/100ML-% IV SOLN
INTRAVENOUS | Status: AC
  Filled 2018-08-12: qty 100

## 2018-08-12 MED ORDER — PROPOFOL 10 MG/ML IV BOLUS
INTRAVENOUS | Status: DC | PRN
  Administered 2018-08-12: 110 mg via INTRAVENOUS
  Administered 2018-08-12: 40 mg via INTRAVENOUS

## 2018-08-12 MED ORDER — OXYCODONE HCL 5 MG PO TABS
ORAL_TABLET | ORAL | Status: AC
  Filled 2018-08-12: qty 2

## 2018-08-12 MED ORDER — BACITRACIN ZINC 500 UNIT/GM EX OINT
TOPICAL_OINTMENT | CUTANEOUS | Status: DC | PRN
  Administered 2018-08-12: 1 via TOPICAL

## 2018-08-12 MED ORDER — MENTHOL 3 MG MT LOZG: 1.0000 | LOZENGE | OROMUCOSAL | Status: DC | PRN

## 2018-08-12 MED ORDER — MIDAZOLAM HCL 2 MG/2ML IJ SOLN: 0.5000 mg | Freq: Once | INTRAMUSCULAR | Status: DC | PRN

## 2018-08-12 MED ORDER — ONDANSETRON HCL 4 MG/2ML IJ SOLN
INTRAMUSCULAR | Status: DC | PRN
  Administered 2018-08-12: 4 mg via INTRAVENOUS

## 2018-08-12 MED ORDER — PHENYLEPHRINE 40 MCG/ML (10ML) SYRINGE FOR IV PUSH (FOR BLOOD PRESSURE SUPPORT)
PREFILLED_SYRINGE | INTRAVENOUS | Status: AC
  Filled 2018-08-12: qty 10

## 2018-08-12 MED ORDER — ACETAMINOPHEN 650 MG RE SUPP: 650.0000 mg | RECTAL | Status: DC | PRN

## 2018-08-12 MED ORDER — LIDOCAINE-EPINEPHRINE 1 %-1:100000 IJ SOLN
INTRAMUSCULAR | Status: AC
  Filled 2018-08-12: qty 1

## 2018-08-12 MED ORDER — DEXAMETHASONE SODIUM PHOSPHATE 10 MG/ML IJ SOLN
INTRAMUSCULAR | Status: DC | PRN
  Administered 2018-08-12: 5 mg via INTRAVENOUS

## 2018-08-12 MED ORDER — PROMETHAZINE HCL 25 MG/ML IJ SOLN: 6.2500 mg | INTRAMUSCULAR | Status: DC | PRN

## 2018-08-12 MED ORDER — MEPERIDINE HCL 50 MG/ML IJ SOLN: 6.2500 mg | INTRAMUSCULAR | Status: DC | PRN

## 2018-08-12 MED ORDER — THROMBIN 5000 UNITS EX SOLR
CUTANEOUS | Status: AC
  Filled 2018-08-12: qty 5000

## 2018-08-12 SURGICAL SUPPLY — 66 items
BAG DECANTER FOR FLEXI CONT (MISCELLANEOUS) ×2 IMPLANT
BENZOIN TINCTURE PRP APPL 2/3 (GAUZE/BANDAGES/DRESSINGS) IMPLANT
BIT DRILL 2.4 (BIT) ×2 IMPLANT
BLADE CLIPPER SURG (BLADE) IMPLANT
BLADE ULTRA TIP 2M (BLADE) IMPLANT
BUR MATCHSTICK NEURO 3.0 LAGG (BURR) ×2 IMPLANT
BUR PRECISION FLUTE 5.0 (BURR) ×2 IMPLANT
CANISTER SUCT 3000ML PPV (MISCELLANEOUS) ×2 IMPLANT
CARTRIDGE OIL MAESTRO DRILL (MISCELLANEOUS) ×1 IMPLANT
COVER WAND RF STERILE (DRAPES) ×2 IMPLANT
DECANTER SPIKE VIAL GLASS SM (MISCELLANEOUS) ×2 IMPLANT
DERMABOND ADVANCED (GAUZE/BANDAGES/DRESSINGS) ×1
DERMABOND ADVANCED .7 DNX12 (GAUZE/BANDAGES/DRESSINGS) ×1 IMPLANT
DIFFUSER DRILL AIR PNEUMATIC (MISCELLANEOUS) ×2 IMPLANT
DRAPE C-ARM 42X72 X-RAY (DRAPES) ×4 IMPLANT
DRAPE LAPAROTOMY 100X72 PEDS (DRAPES) ×2 IMPLANT
DRSG OPSITE POSTOP 4X8 (GAUZE/BANDAGES/DRESSINGS) ×2 IMPLANT
DURAPREP 6ML APPLICATOR 50/CS (WOUND CARE) ×2 IMPLANT
ELECT REM PT RETURN 9FT ADLT (ELECTROSURGICAL) ×2
ELECTRODE REM PT RTRN 9FT ADLT (ELECTROSURGICAL) ×1 IMPLANT
GAUZE 4X4 16PLY RFD (DISPOSABLE) IMPLANT
GAUZE SPONGE 4X4 12PLY STRL (GAUZE/BANDAGES/DRESSINGS) IMPLANT
GLOVE BIO SURGEON STRL SZ7 (GLOVE) IMPLANT
GLOVE BIOGEL PI IND STRL 7.0 (GLOVE) IMPLANT
GLOVE BIOGEL PI IND STRL 7.5 (GLOVE) ×1 IMPLANT
GLOVE BIOGEL PI INDICATOR 7.0 (GLOVE)
GLOVE BIOGEL PI INDICATOR 7.5 (GLOVE) ×1
GLOVE ECLIPSE 7.0 STRL STRAW (GLOVE) ×2 IMPLANT
GLOVE ECLIPSE 7.5 STRL STRAW (GLOVE) ×6 IMPLANT
GLOVE EXAM NITRILE XL STR (GLOVE) IMPLANT
GLOVE SURG SS PI 7.0 STRL IVOR (GLOVE) ×2 IMPLANT
GOWN STRL REUS W/ TWL LRG LVL3 (GOWN DISPOSABLE) ×2 IMPLANT
GOWN STRL REUS W/ TWL XL LVL3 (GOWN DISPOSABLE) IMPLANT
GOWN STRL REUS W/TWL 2XL LVL3 (GOWN DISPOSABLE) ×2 IMPLANT
GOWN STRL REUS W/TWL LRG LVL3 (GOWN DISPOSABLE) ×2
GOWN STRL REUS W/TWL XL LVL3 (GOWN DISPOSABLE)
GRAFT BN 5X1XSPNE CVD POST DBM (Bone Implant) ×1 IMPLANT
GRAFT BONE MAGNIFUSE 1X5CM (Bone Implant) ×1 IMPLANT
HEMOSTAT POWDER SURGIFOAM 1G (HEMOSTASIS) ×2 IMPLANT
KIT BASIN OR (CUSTOM PROCEDURE TRAY) ×2 IMPLANT
KIT INFUSE X SMALL 1.4CC (Orthopedic Implant) ×2 IMPLANT
KIT TURNOVER KIT B (KITS) ×2 IMPLANT
NEEDLE HYPO 22GX1.5 SAFETY (NEEDLE) ×2 IMPLANT
NS IRRIG 1000ML POUR BTL (IV SOLUTION) ×2 IMPLANT
OIL CARTRIDGE MAESTRO DRILL (MISCELLANEOUS) ×2
PACK LAMINECTOMY NEURO (CUSTOM PROCEDURE TRAY) ×2 IMPLANT
PAD ARMBOARD 7.5X6 YLW CONV (MISCELLANEOUS) ×6 IMPLANT
PIN MAYFIELD SKULL DISP (PIN) ×2 IMPLANT
ROD SPINAL PRE CUT 3.5X70 (Rod) ×4 IMPLANT
SCREW MULTI AXIAL 3.5X14MM (Screw) ×16 IMPLANT
SCREW MULTI AXIAL 3.5X16MM (Screw) ×4 IMPLANT
SCREW SET 3600315 STANDARD (Screw) ×20 IMPLANT
SCREW SET 3600315 STD (Screw) ×10 IMPLANT
SPONGE LAP 4X18 RFD (DISPOSABLE) IMPLANT
SPONGE SURGIFOAM ABS GEL 100 (HEMOSTASIS) ×2 IMPLANT
STAPLER VISISTAT 35W (STAPLE) ×2 IMPLANT
STRIP CLOSURE SKIN 1/2X4 (GAUZE/BANDAGES/DRESSINGS) IMPLANT
SUT ETHILON 3 0 FSL (SUTURE) IMPLANT
SUT VIC AB 0 CT1 18XCR BRD8 (SUTURE) ×2 IMPLANT
SUT VIC AB 0 CT1 8-18 (SUTURE) ×2
SUT VIC AB 2-0 CT1 18 (SUTURE) ×4 IMPLANT
SUT VICRYL 3-0 RB1 18 ABS (SUTURE) ×2 IMPLANT
TOWEL GREEN STERILE (TOWEL DISPOSABLE) ×2 IMPLANT
TOWEL GREEN STERILE FF (TOWEL DISPOSABLE) ×2 IMPLANT
UNDERPAD 30X30 (UNDERPADS AND DIAPERS) ×2 IMPLANT
WATER STERILE IRR 1000ML POUR (IV SOLUTION) ×2 IMPLANT

## 2018-08-12 NOTE — Anesthesia Preprocedure Evaluation (Addendum)
Anesthesia Evaluation  Patient identified by MRN, date of birth, ID band Patient awake    Reviewed: Allergy & Precautions, NPO status , Patient's Chart, lab work & pertinent test results  History of Anesthesia Complications Negative for: history of anesthetic complications  Airway Mallampati: I  TM Distance: >3 FB Neck ROM: Full    Dental  (+) Poor Dentition, Chipped, Dental Advisory Given, Missing   Pulmonary Current Smoker,    breath sounds clear to auscultation       Cardiovascular (-) angina Rhythm:Regular Rate:Normal  '17 stress test:  - small, mild, reversible defect involving the mid to apical anterior wall, additional small, mild, reversible defect involving the mid to basal inferoseptal wall. There is significant gut uptake which interferes with the study interpretation, but these may represent areas of mild ischemia. - Normal LV systolic function. EF 60%.   Neuro/Psych Cerebral palsy    GI/Hepatic negative GI ROS, Neg liver ROS,   Endo/Other  negative endocrine ROS  Renal/GU negative Renal ROS     Musculoskeletal  (+) Arthritis , scoliosis   Abdominal   Peds  Hematology negative hematology ROS (+)   Anesthesia Other Findings   Reproductive/Obstetrics                            Anesthesia Physical Anesthesia Plan  ASA: III  Anesthesia Plan: General   Post-op Pain Management:    Induction: Intravenous  PONV Risk Score and Plan: 1 and Ondansetron and Dexamethasone  Airway Management Planned: Oral ETT  Additional Equipment:   Intra-op Plan:   Post-operative Plan: Extubation in OR  Informed Consent: I have reviewed the patients History and Physical, chart, labs and discussed the procedure including the risks, benefits and alternatives for the proposed anesthesia with the patient or authorized representative who has indicated his/her understanding and acceptance.    Dental advisory given  Plan Discussed with: CRNA and Surgeon  Anesthesia Plan Comments: (Plan routine monitors, GETA)        Anesthesia Quick Evaluation

## 2018-08-12 NOTE — Anesthesia Procedure Notes (Signed)
Procedure Name: Intubation Date/Time: 08/12/2018 1:01 PM Performed by: Elliot Dally, CRNA Pre-anesthesia Checklist: Patient identified, Emergency Drugs available, Suction available and Patient being monitored Patient Re-evaluated:Patient Re-evaluated prior to induction Oxygen Delivery Method: Circle System Utilized Preoxygenation: Pre-oxygenation with 100% oxygen Induction Type: IV induction Ventilation: Mask ventilation without difficulty Laryngoscope Size: Glidescope and 3 Grade View: Grade I Tube type: Oral Tube size: 7.5 mm Number of attempts: 1 Airway Equipment and Method: Stylet and Oral airway Placement Confirmation: ETT inserted through vocal cords under direct vision,  positive ETCO2 and breath sounds checked- equal and bilateral Secured at: 22 cm Tube secured with: Tape Dental Injury: Teeth and Oropharynx as per pre-operative assessment

## 2018-08-12 NOTE — Anesthesia Postprocedure Evaluation (Signed)
Anesthesia Post Note  Patient: Esiah Bazinet  Procedure(s) Performed: POSTERIOR CERVICAL FUSION WITH LATERAL MASS FIXATION CERVICAL THREE TO CERVICAL SEVEN (N/A )     Patient location during evaluation: PACU Anesthesia Type: General Level of consciousness: awake and alert, oriented and patient cooperative Pain management: pain level controlled Vital Signs Assessment: post-procedure vital signs reviewed and stable Respiratory status: spontaneous breathing, nonlabored ventilation and respiratory function stable Cardiovascular status: blood pressure returned to baseline and stable Postop Assessment: no apparent nausea or vomiting Anesthetic complications: no    Last Vitals:  Vitals:   08/12/18 1754 08/12/18 1910  BP: (!) 139/93   Pulse: 98 97  Resp: 19 (!) 23  Temp:    SpO2: 97% 100%    Last Pain:  Vitals:   08/12/18 1900  TempSrc:   PainSc: 7     LLE Motor Response: Purposeful movement;Responds to commands (08/12/18 1900) LLE Sensation: Full sensation (08/12/18 1900) RLE Motor Response: Purposeful movement (08/12/18 1900) RLE Sensation: Full sensation (08/12/18 1900)      Erling Cruz. Deah Ottaway

## 2018-08-12 NOTE — H&P (Signed)
Chief Complaint   Neck pain  HPI   HPI: Philip Bates is a 57 y.o. male s/p C3-7 ACDF by Dr Bevely Palmer,  who continues to have severe neck pain despite intervention although preoperative BUE pain has resolved.   He endorses decreased ROM in neck due to pain. He denies BUE weakness, pain or N/T. He has trialed multiple po medications and has even worn an external bone growth stimulator without relief in his pain. A CT scan of his neck was ordered and no interbody fusion was evident at any of the operative levels. There also appeared to be a fracture of the left C7 vertebral body screw. He presents today for surgery due to continued pain which is likely secondary to pseudoarthrosis. He is without any concerns.  Patient Active Problem List   Diagnosis Date Noted  . Erectile dysfunction 02/22/2018  . Cervical spondylosis with radiculopathy 08/11/2017  . Chronic right shoulder pain 06/04/2017  . Radiculopathy, cervical 06/01/2017  . Tobacco use disorder 02/10/2017  . Insomnia 02/10/2017  . Cerebral palsy (HCC) 02/10/2017  . Chronic lower back pain 02/10/2017    PMH: Past Medical History:  Diagnosis Date  . Arthritis   . Cerebral palsy (HCC)   . Cervical radiculopathy   . H/O umbilical hernia repair 2001  . Scoliosis   . Spondylosis of cervical spine     PSH: Past Surgical History:  Procedure Laterality Date  . ANTERIOR CERVICAL DECOMPRESSION/DISCECTOMY FUSION 4 LEVELS N/A 08/11/2017   Procedure: Cervical three to Cervical seven Anterior Cervical discectomy with fusion/plate fixation;  Surgeon: Ditty, Loura Halt, MD;  Location: [facility] OR;  Service: Neurosurgery;  Laterality: N/A;  . BACK SURGERY    . HERNIA REPAIR    . LEG SURGERY    . SHOULDER ARTHROSCOPY WITH ROTATOR CUFF REPAIR AND SUBACROMIAL DECOMPRESSION Right 02/02/2018   Procedure: RIGHT SHOULDER ARTHROSCOPY WITH SUBACROMIAL DECOMPRESSION, MINI-OPEN ROTATOR CUFF REPAIR;  Surgeon: Cammy Copa, MD;  Location: [facility] OR;   Service: Orthopedics;  Laterality: Right;  . SHOULDER SURGERY Right     No medications prior to admission.    SH: Social History   Tobacco Use  . Smoking status: Current Every Day Smoker    Packs/day: 0.50    Types: Cigarettes  . Smokeless tobacco: Never Used  . Tobacco comment: using patch; cut back to half pack/day.   Substance Use Topics  . Alcohol use: No  . Drug use: No    MEDS: Prior to Admission medications   Medication Sig Start Date End Date Taking? Authorizing Provider  acetaminophen (TYLENOL) 500 MG tablet Take 1,000 mg by mouth every 6 (six) hours as needed for moderate pain.   Yes [provider]  Multiple Vitamin (MULTIVITAMIN WITH MINERALS) TABS tablet Take 1 tablet by mouth daily.   Yes [provider]  traMADol (ULTRAM) 50 MG tablet TAKE 1 TABLET BY MOUTH EVERY DAY AS NEEDED FOR PAIN Patient taking differently: Take 50 mg by mouth daily.  07/08/18  Yes Cammy Copa, MD  ciprofloxacin (CIPRO) 250 MG tablet Take 1 tablet (250 mg total) by mouth 2 (two) times daily. Patient not taking: Reported on 08/04/2018 02/22/18   [place], Betty G, MD  diazepam (VALIUM) 5 MG tablet Take 1 tablet (5 mg total) by mouth daily as needed for muscle spasms (for insomnia and cervical muscle spasm.). Patient not taking: Reported on 08/04/2018 05/19/18   [place], Betty G, MD  gabapentin (NEURONTIN) 300 MG capsule TAKE 1 CAPSULE BY MOUTH TWICE  A DAY Patient not taking: Reported on 08/04/2018 03/15/18   Cammy Copa, MD  HYDROcodone-acetaminophen (NORCO/VICODIN) 5-325 MG tablet Take 1 tablet by mouth every 12 (twelve) hours as needed for moderate pain. Patient not taking: Reported on 08/04/2018 03/12/18   Cammy Copa, MD  ibuprofen (ADVIL,MOTRIN) 600 MG tablet Take 1 tablet (600 mg total) by mouth every 6 (six) hours as needed. Patient not taking: Reported on 08/04/2018 04/30/18   Fayrene Helper, PA-C  methocarbamol (ROBAXIN) 500 MG tablet Take 1 tablet (500  mg total) by mouth 2 (two) times daily. Patient not taking: Reported on 08/04/2018 04/30/18   Fayrene Helper, PA-C  ondansetron (ZOFRAN) 4 MG tablet Take 1 tablet (4 mg total) by mouth every 8 (eight) hours as needed for nausea or vomiting. Patient not taking: Reported on 08/04/2018 02/12/18   Ginger Carne, MD  oxyCODONE (OXY IR/ROXICODONE) 5 MG immediate release tablet 1 po q 8-12 hrs prn pain Patient not taking: Reported on 08/04/2018 03/02/18   Cammy Copa, MD  sildenafil (REVATIO) 20 MG tablet TAKE 2 TABLETS (40 MG TOTAL) BY MOUTH DAILY AS NEEDED. 04/26/18   [place], Betty G, MD  traMADol Janean Sark) 50 MG tablet 1 po q d to bid prn pain Patient not taking: Reported on 08/04/2018 04/23/18   Cammy Copa, MD  traMADol Janean Sark) 50 MG tablet 1 po q d prn pain Patient not taking: Reported on 08/04/2018 05/03/18   Cammy Copa, MD    ALLERGY: Allergies  Allergen Reactions  . Naproxen Other (See Comments)    In high stomach  Irritation   . Flexeril [Cyclobenzaprine] Nausea Only    Social History   Tobacco Use  . Smoking status: Current Every Day Smoker    Packs/day: 0.50    Types: Cigarettes  . Smokeless tobacco: Never Used  . Tobacco comment: using patch; cut back to half pack/day.   Substance Use Topics  . Alcohol use: No     Family History  Problem Relation Age of Onset  . Cancer Father        esophagus  . Diabetes Brother   . Diabetes Paternal Uncle      ROS   ROS  Exam   There were no vitals filed for this visit. General appearance: WDWN, NAD Eyes: No scleral injection Cardiovascular: Regular rate and rhythm without murmurs, rubs, gallops. No edema or variciosities. Distal pulses normal. Pulmonary: Effort normal, non-labored breathing Musculoskeletal:     Muscle tone upper extremities: Normal    Muscle tone lower extremities: Normal    Motor exam: Upper Extremities Deltoid Bicep Tricep Grip  Right 5/5 5/5 5/5 5/5  Left 5/5 5/5 5/5 5/5   Lower  Extremity IP Quad PF DF EHL  Right 5/5 5/5 5/5 5/5 5/5  Left 5/5 5/5 5/5 5/5 5/5   Neurological Mental Status:    - Patient is awake, alert, oriented to person, place, month, year, and situation    - Patient is able to give a clear and coherent history.    - No signs of aphasia or neglect Cranial Nerves    - II: Visual Fields are full. PERRL    - III/IV/VI: EOMI without ptosis or diploplia.     - V: Facial sensation is grossly normal    - VII: Facial movement is symmetric.     - VIII: hearing is intact to voice    - X: Uvula elevates symmetrically    - XI: Shoulder shrug is symmetric.    -  XII: tongue is midline without atrophy or fasciculations.  Sensory: Sensation grossly intact to LT Deep Tendon Reflexes    - 2+ and symmetric in the biceps and patellae.  Plantars   - Toes are downgoing bilaterally.  Cerebellar    - FNF and HKS are intact bilaterally   Results - Imaging/Labs   No results found for this or any previous visit (from the past 48 hour(s)).  No results found.  IMAGING: CT of the cervical spine dated 07/09/2018 was reviewed.  This demonstrates no convincing interbody fusion at C3-4, C4-5, C5-6, or C6-7.  Also noted is fracture of the left C7 screw.  In review of prior x-rays of November 04, 2017, the C7 vertebral body screws appeared to be intact at that time.   Impression/Plan   57 y.o. male with continued neck and trapezius region pain likely related to pseudoarthrosis with fracture of the left C7 vertebral body screw. We will plan on roceeding with posterior segmental instrumentation from C3-C7 with lateral mass screws and posterolateral arthrodesis with allograft, and with his risk factors will likely employ [id number] (InFuse)  While in the office the risks which include but are not limited to nerve/spinal cord injury leading to arm or leg weakness/numbness/paralysis and/or bowel and bladder dysfunction, CSF leak, bleeding, and infection.  Possible outcomes of  surgery were also discussed including the possibility of persistence or worsening of pain symptoms and the possible need for additional surgeries.  The general risks of anesthesia were also reviewed including heart attack, stroke, and DVT/PE.    The patient understood the discussion and is willing to proceed with surgical stabilization and fusion.

## 2018-08-12 NOTE — Transfer of Care (Signed)
Immediate Anesthesia Transfer of Care Note  Patient: Philip Bates  Procedure(s) Performed: POSTERIOR CERVICAL FUSION WITH LATERAL MASS FIXATION CERVICAL THREE TO CERVICAL SEVEN (N/A )  Patient Location: PACU  Anesthesia Type:General  Level of Consciousness: awake, alert  and oriented  Airway & Oxygen Therapy: Patient Spontanous Breathing and Patient connected to nasal cannula oxygen  Post-op Assessment: Report given to RN and Post -op Vital signs reviewed and stable  Post vital signs: Reviewed and stable  Last Vitals:  Vitals Value Taken Time  BP 138/97 08/12/2018  3:08 PM  Temp    Pulse 79 08/12/2018  3:11 PM  Resp 19 08/12/2018  3:11 PM  SpO2 99 % 08/12/2018  3:11 PM  Vitals shown include unvalidated device data.  Last Pain:  Vitals:   08/12/18 1039  TempSrc:   PainSc: 8          Complications: No apparent anesthesia complications

## 2018-08-12 NOTE — Op Note (Signed)
NEUROSURGERY OPERATIVE NOTE   PREOP DIAGNOSIS:  1. 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 Cervicalgia 3. Hardware failure, C3-C7 anterior cervical plate and screws   POSTOP DIAGNOSIS: Same  PROCEDURE: 1. Posterior segmental instrumentation, C3-C7 2. Posterolateral arthrodesis, C3-C7 3. Use of locally harvested bone autograft 4. Use of non-structural bone allograft - 5cm Magnifuse, rhBMP-2  SURGEON: Dr. Lisbeth Renshaw, MD  ASSISTANT: Cindra Presume, PA-C  ANESTHESIA: General Endotracheal  EBL: 50cc  SPECIMENS: None  DRAINS: None  COMPLICATIONS: None immediate  CONDITION: Hemodynamically stable to PACU  HISTORY: Philip Bates is a 57 y.o. male who was initially treated for multilevel cervical disc disease by our previous partner, Dr. Bevely Palmer approximately 1 year ago via C3-C7 anterior discectomy and fusion.  Postoperatively, the patient continued to complain of relatively severe primarily posterior neck and shoulder pain.  Follow-up imaging demonstrated pseudoarthrosis certainly at the C5-6 and C6 levels, with broken C6 vertebral body screw indicating continued motion.  He therefore elected to proceed with posterior stabilization and fusion.  The risks and benefits of the surgery were explained in detail to the patient and his family.  After all questions were answered informed consent was obtained and witnessed.  PROCEDURE IN DETAIL: The patient was brought to the operating room. After induction of general anesthesia, the patient was positioned on the operative table in the [place] head holder in the prone position. All pressure points were meticulously padded. Skin incision was then marked out and prepped and draped in the usual sterile fashion.  After timeout was conducted, skin incision was infiltrated with local anesthetic with epinephrine.  Midline skin incision was then made sharply and Bovie electrocautery was used to dissected subcutaneous tissue.  The nuchal fascia was then  identified and incised in the midline, and utilizing the avascular midline plane suboccipital musculature was divided until the spinous processes were identified.  Subperiosteal dissection was then carried out to identify the lamina and lateral mass at C3-C7.  A towel clip was placed on the C3 spinous process and intraoperative lateral fluoroscopic image was taken to confirm location at the correct level.  Self-retaining retractors were then placed, and the high-speed drill was used to identify and mark entry points for bilateral lateral mass screws from C3-C7 using standard anatomic landmarks.  14 mm drill and guide was then used to create pilot holes from C3-C6, while 18 mm guide was used to create a pilot hole at C7 bilaterally.  The pilot holes were then checked with a sound.  In order to achieve adequate trajectory for the lower lateral mass screws, I did remove the posterior half of the spinous process at C6 and C7.  This bone was saved for use and arthrodesis.  Pilot holes were then tapped in preparation for placement of instrumentation.  Prior to placement of the screws, the high-speed drill was used to decorticate all the exposed bone surfaces including the spinous processes, lamina, lateral mass, and facet complexes from C3-C7.  Lateral mass screws were then placed from C3-C6 measuring 14 mm, and 18 mm screws were placed bilaterally at C7.  Pre-bent 70 mm lordotic rod was then placed bilaterally and secured with set screws which were final tightened.  Final fluoroscopic image was taken.  5 cm bags of magna views were then placed over the lamina after BMP was placed.  The bone harvested during placement of the lateral mass screws was also placed over the exposed bone surface for arthrodesis.  At this point the self-retaining retractors were removed.  Hemostasis was  secured with a combination of bipolar electrocautery and morselized Gelfoam and thrombin.  The wound was then closed in multiple layers  using a combination of interrupted 0 and 2-0 Vicryl stitches.  Skin was closed with staples.  Bacitracin ointment and sterile dressing was applied.  The patient was then transferred to the stretcher and removed from the [place] head holder.  He was extubated, and taken to the postanesthesia care unit in stable hemodynamic condition.  At the end of the case all sponge, needle, instrument, and cottonoid counts were correct.

## 2018-08-13 ENCOUNTER — Encounter (HOSPITAL_COMMUNITY): Payer: Self-pay | Admitting: Neurosurgery

## 2018-08-13 LAB — CBC
HCT: 37.8 % — ABNORMAL LOW (ref 39.0–52.0)
Hemoglobin: 12.4 g/dL — ABNORMAL LOW (ref 13.0–17.0)
MCH: 26.2 pg (ref 26.0–34.0)
MCHC: 32.8 g/dL (ref 30.0–36.0)
MCV: 79.9 fL — ABNORMAL LOW (ref 80.0–100.0)
Platelets: 179 10*3/uL (ref 150–400)
RBC: 4.73 MIL/uL (ref 4.22–5.81)
RDW: 14.4 % (ref 11.5–15.5)
WBC: 10.9 10*3/uL — ABNORMAL HIGH (ref 4.0–10.5)
nRBC: 0 % (ref 0.0–0.2)

## 2018-08-13 LAB — BASIC METABOLIC PANEL
Anion gap: 9 (ref 5–15)
BUN: 10 mg/dL (ref 6–20)
CO2: 24 mmol/L (ref 22–32)
Calcium: 8.6 mg/dL — ABNORMAL LOW (ref 8.9–10.3)
Chloride: 105 mmol/L (ref 98–111)
Creatinine, Ser: 0.83 mg/dL (ref 0.61–1.24)
GFR calc Af Amer: >60 mL/min (ref >60)
GFR calc non Af Amer: >60 mL/min (ref >60)
Glucose, Bld: 104 mg/dL — ABNORMAL HIGH (ref 70–99)
Potassium: 3.8 mmol/L (ref 3.5–5.1)
Sodium: 138 mmol/L (ref 135–145)

## 2018-08-13 LAB — APTT: aPTT: 28 s (ref 24–36)

## 2018-08-13 LAB — PROTIME-INR
INR: 1.09
Prothrombin Time: 14 seconds (ref 11.4–15.2)

## 2018-08-13 MED ORDER — METHOCARBAMOL 500 MG PO TABS: 500.0000 mg | ORAL_TABLET | Freq: Four times a day (QID) | ORAL | 1 refills | Status: DC | PRN

## 2018-08-13 MED ORDER — GABAPENTIN 300 MG PO CAPS: 300.0000 mg | ORAL_CAPSULE | Freq: Three times a day (TID) | ORAL | 0 refills | Status: DC

## 2018-08-13 MED ORDER — OXYCODONE HCL 5 MG PO TABS: 5.0000 mg | ORAL_TABLET | ORAL | 0 refills | Status: DC | PRN

## 2018-08-13 MED ORDER — DOCUSATE SODIUM 100 MG PO CAPS: 100.0000 mg | ORAL_CAPSULE | Freq: Two times a day (BID) | ORAL | 0 refills | Status: DC

## 2018-08-13 NOTE — Discharge Instructions (Signed)
Wound Care  °You may remove outer bandage after 2 days and shower.  °Keep incision open to air. °Do not put any creams, lotions, or ointments on incision. ° °Activity °Walk each and every day, increasing distance each day. °No lifting greater than 5 lbs.  Avoid excessive neck motion. °No driving for 2 weeks; may ride as a passenger locally. °Wear neck brace at all times except when showering. °Diet °Resume your normal diet.  °Return to Work °Will be discussed at you follow up appointment. °Call Your Doctor If Any of These Occur °Redness, drainage, or swelling at the wound.  °Temperature greater than 101 degrees. °Severe pain not relieved by pain medication. °Increased difficulty swallowing.  °Incision starts to come apart. °Follow Up Appt °Call today and ask for appointment in 1-2 weeks ([phone]) or for problems.  If you have any hardware placed in your spine, you will need an x-ray before your appointment. ° °

## 2018-08-13 NOTE — Discharge Summary (Signed)
Physician Discharge Summary  Patient ID: Philip Bates MRN: [id number] DOB/AGE: 07/06/1961 57 y.o.  Admit date: 08/12/2018 Discharge date: 08/13/2018  Admission Diagnoses: Cervical pseudoarthrosis, cervicalgia, cervical myelopathy  Discharge Diagnoses: The same Active Problems:   Cervical pseudoarthrosis [facility])   Discharged Condition: fair  Hospital Course: Dr. Conchita Paris performed a posterior cervical instrumentation fusion from C3-C7 on 08/12/2018.  The patient's postoperative course was unremarkable.  On postoperative day #1 he requested discharge home.  He was given written and oral discharge instructions.  All his questions were answered.  Consults: Physical therapy Significant Diagnostic Studies: None Treatments: C3-C7 posterior cervical instrumentation and fusion Discharge Exam: Blood pressure (!) 141/88, pulse 65, temperature 99.1 F (37.3 C), temperature source Oral, resp. rate 16, height 5\' 4"  (1.626 m), weight 53.1 kg, SpO2 100 %. The patient is alert and pleasant.  He is moving all 4 extremities.  His posterior cervical incision has a bloodstain.  Disposition: Home with his wife.  Discharge Instructions    Call MD for:  difficulty breathing, headache or visual disturbances   Complete by:  As directed    Call MD for:  extreme fatigue   Complete by:  As directed    Call MD for:  hives   Complete by:  As directed    Call MD for:  persistant dizziness or light-headedness   Complete by:  As directed    Call MD for:  persistant nausea and vomiting   Complete by:  As directed    Call MD for:  redness, tenderness, or signs of infection (pain, swelling, redness, odor or green/yellow discharge around incision site)   Complete by:  As directed    Call MD for:  severe uncontrolled pain   Complete by:  As directed    Call MD for:  temperature >100.4   Complete by:  As directed    Diet - low sodium heart healthy   Complete by:  As directed    Discharge instructions   Complete  by:  As directed    Call [phone] for a followup appointment. Take a stool softener while you are using pain medications.   Driving Restrictions   Complete by:  As directed    Do not drive for 2 weeks.   Increase activity slowly   Complete by:  As directed    Lifting restrictions   Complete by:  As directed    Do not lift more than 5 pounds. No excessive bending or twisting.   May shower / Bathe   Complete by:  As directed    Remove the dressing for 3 days after surgery.  You may shower, but leave the incision alone.   Remove dressing in 48 hours   Complete by:  As directed    Your stitches are under the scan and will dissolve by themselves. The Steri-Strips will fall off after you take a few showers. Do not rub back or pick at the wound, Leave the wound alone.     Allergies as of 08/13/2018      Reactions   Naproxen Other (See Comments)   In high stomach  Irritation    Flexeril [cyclobenzaprine] Nausea Only      Medication List    STOP taking these medications   acetaminophen 500 MG tablet Commonly known as:  TYLENOL   ciprofloxacin 250 MG tablet Commonly known as:  CIPRO   diazepam 5 MG tablet Commonly known as:  VALIUM   HYDROcodone-acetaminophen 5-325 MG tablet Commonly known as:  NORCO/VICODIN   ibuprofen 600 MG tablet Commonly known as:  ADVIL,MOTRIN   ondansetron 4 MG tablet Commonly known as:  ZOFRAN   traMADol 50 MG tablet Commonly known as:  ULTRAM     TAKE these medications   docusate sodium 100 MG capsule Commonly known as:  COLACE Take 1 capsule (100 mg total) by mouth 2 (two) times daily.   gabapentin 300 MG capsule Commonly known as:  NEURONTIN Take 1 capsule (300 mg total) by mouth 3 (three) times daily. What changed:  when to take this   methocarbamol 500 MG tablet Commonly known as:  ROBAXIN Take 1 tablet (500 mg total) by mouth every 6 (six) hours as needed for muscle spasms. What changed:    when to take this  reasons to take  this   multivitamin with minerals Tabs tablet Take 1 tablet by mouth daily.   oxyCODONE 5 MG immediate release tablet Commonly known as:  Oxy IR/ROXICODONE Take 1-2 tablets (5-10 mg total) by mouth every 4 (four) hours as needed for severe pain ((score 7 to 10)). What changed:    how much to take  how to take this  when to take this  reasons to take this  additional instructions   sildenafil 20 MG tablet Commonly known as:  REVATIO TAKE 2 TABLETS (40 MG TOTAL) BY MOUTH DAILY AS NEEDED.        Signed: Cristi Loron 08/13/2018, 7:44 AM

## 2018-08-13 NOTE — Evaluation (Signed)
Physical Therapy Evaluation and Discharge Patient Details Name: Philip Bates MRN: [id number] DOB: [birth year] Today's Date: 08/13/2018   History of Present Illness  Pt is a 57 y/o male now s/p posterior cervical instrumentation fusion from C3-C7. PMHx includes previous ACDF in 08/2017, cerebral palsy, scoliosis  Clinical Impression  Patient evaluated by Physical Therapy with no further acute PT needs identified. All education has been completed and the patient has no further questions. At the time of PT eval pt was able to perform transfers and ambulation at a gross supervision level with RW for support. Recommend RW use at this time to decrease stress on shoulders and improve balance and tolerance for functional activity. Pt is near baseline of function and will have wife available at home to assist if needed. Pt and wife were educated on [place] use in tub shower as a shower seat, car transfer, stair negotiation, and general safety with activity progression. See below for any follow-up Physical Therapy or equipment needs. PT is signing off. Thank you for this referral.     Follow Up Recommendations No PT follow up;Supervision for mobility/OOB    Equipment Recommendations  3in1 (PT)    Recommendations for Other Services       Precautions / Restrictions Precautions Precautions: Fall;Cervical Precaution Booklet Issued: Yes (comment) Precaution Comments: Pt was cued for precautions during functional mobility.  Required Braces or Orthoses: Cervical Brace Cervical Brace: Hard collar Restrictions Weight Bearing Restrictions: No      Mobility  Bed Mobility Overal bed mobility: Needs Assistance Bed Mobility: Rolling;Sidelying to Sit Rolling: Supervision Sidelying to sit: Supervision       General bed mobility comments: HOB flat and rails lowered to simulate home environment. Pt was able to complete without assistance.   Transfers Overall transfer level: Needs assistance Equipment used:  Rolling walker (2 wheeled) Transfers: Sit to/from Stand Sit to Stand: Supervision         General transfer comment: With RW, pt was at a supervision level.   Ambulation/Gait Ambulation/Gait assistance: Min guard;Supervision Gait Distance (Feet): 200 Feet Assistive device: Rolling walker (2 wheeled) Gait Pattern/deviations: Step-through pattern;Decreased stride length;Narrow base of support Gait velocity: Decreased Gait velocity interpretation: 1.31 - 2.62 ft/sec, indicative of limited community ambulator General Gait Details: Min guard assist progressing to supervision for safety. No assistance required. Pt reports he is at baseline however right now walker is taking pressure off his shoulders.   Stairs         General stair comments: Pt declines stair training however we discussed safe entry to home with RW  Wheelchair Mobility    Modified Rankin (Stroke Patients Only)       Balance Overall balance assessment: Needs assistance Sitting-balance support: Feet supported Sitting balance-Leahy Scale: Good     Standing balance support: Single extremity supported;During functional activity Standing balance-Leahy Scale: Fair Standing balance comment: able to static stand with minguard assist; reliant on at least single UE support for dynamic mobility                             Pertinent Vitals/Pain Pain Assessment: Faces Faces Pain Scale: Hurts little more Pain Location: bil UEs Pain Descriptors / Indicators: Guarding;Sore Pain Intervention(s): Monitored during session    Home Living Family/patient expects to be discharged to:: Private residence Living Arrangements: Spouse/significant other Available Help at Discharge: Family Type of Home: House Home Access: Stairs to enter   [employer] of Steps: 1 onto  porch, one more to enter home Home Layout: One level Home Equipment: Walker - 2 wheels;Cane - single point;Bedside commode;Shower seat       Prior Function Level of Independence: Independent with assistive device(s);Needs assistance   Gait / Transfers Assistance Needed: occasionally uses SPC for ambulation  ADL's / Homemaking Assistance Needed: spouse intermittently assists with LB ADLs such as socks/shoes        Hand Dominance   Dominant Hand: Right    Extremity/Trunk Assessment   Upper Extremity Assessment Upper Extremity Assessment: LUE deficits/detail LUE Deficits / Details: pt reports some mild numbness/tingling, overall able to perform functional tasks this AM with increased effort but without significant difficulty LUE Coordination: WNL    Lower Extremity Assessment Lower Extremity Assessment: Generalized weakness(Baseline - deficits consistent with CP)    Cervical / Trunk Assessment Cervical / Trunk Assessment: Other exceptions Cervical / Trunk Exceptions: s/p cervical sx  Communication   Communication: No difficulties  Cognition Arousal/Alertness: Awake/alert Behavior During Therapy: WFL for tasks assessed/performed Overall Cognitive Status: Within Functional Limits for tasks assessed                                        General Comments      Exercises     Assessment/Plan    PT Assessment Patent does not need any further PT services  PT Problem List Decreased strength;Decreased range of motion;Decreased activity tolerance;Decreased balance;Decreased mobility;Decreased knowledge of use of DME;Decreased safety awareness;Decreased knowledge of precautions;Pain       PT Treatment Interventions      PT Goals (Current goals can be found in the Care Plan section)  Acute Rehab PT Goals Patient Stated Goal: hopeful for home today PT Goal Formulation: All assessment and education complete, DC therapy    Frequency     Barriers to discharge        Co-evaluation               AM-PAC PT "6 Clicks" Daily Activity  Outcome Measure Difficulty turning over in bed  (including adjusting bedclothes, sheets and blankets)?: None Difficulty moving from lying on back to sitting on the side of the bed? : None Difficulty sitting down on and standing up from a chair with arms (e.g., wheelchair, bedside commode, etc,.)?: A Little Help needed moving to and from a bed to chair (including a wheelchair)?: A Little Help needed walking in hospital room?: A Little Help needed climbing 3-5 steps with a railing? : A Little 6 Click Score: 20    End of Session Equipment Utilized During Treatment: Gait belt;Cervical collar Activity Tolerance: Patient tolerated treatment well Patient left: in bed;with call bell/phone within reach;with family/visitor present Nurse Communication: Mobility status PT Visit Diagnosis: Unsteadiness on feet (R26.81);Pain Pain - part of body: (neck)    Time: 8295-6213 PT Time Calculation (min) (ACUTE ONLY): 17 min   Charges:   PT Evaluation $PT Eval Moderate Complexity: 1 Mod          Conni Slipper, PT, DPT Acute Rehabilitation Services Pager: [phone] Office: [phone]   Marylynn Pearson 08/13/2018, 1:42 PM

## 2018-08-13 NOTE — Evaluation (Signed)
Occupational Therapy Evaluation Patient Details Name: Philip Bates MRN: [id number] DOB: [birth year] Today's Date: 08/13/2018    History of Present Illness Pt is a 57 y/o male now s/p posterior cervical instrumentation fusion from C3-C7. PMHx includes previous ACDF in 08/2017, cerebral palsy, scoliosis   Clinical Impression   This 57 y/o male presents with the above. At baseline pt reports he receives intermittent assist from spouse for LB ADL, is mod independent with functional mobility intermittently using SPC. Pt performing functional mobility this session using SPC with overall minA. He currently requires setup-minguard assist for UB ADL. Min-modA for LB ADL secondary to baseline difficulties with managing LLE. Educated pt/pt's spouse re: cervical precautions, AE, safety and compensatory strategies for completing ADLs and functional transfers while maintaining precautions with pt verbalizing and demonstrating understanding throughout session. Questions answered throughout. Pt will return home with available assist from spouse for ADLs PRN. Will continue to follow while he remains in acute setting to maximize his safety and independence with ADLs and mobility prior to return home.      Follow Up Recommendations  No OT follow up;Supervision/Assistance - 24 hour    Equipment Recommendations  3 in 1 bedside commode           Precautions / Restrictions Precautions Precautions: Fall;Cervical Precaution Booklet Issued: Yes (comment) Precaution Comments: issued and reviewed with pt/pt's spouse Required Braces or Orthoses: Cervical Brace Cervical Brace: Hard collar Restrictions Weight Bearing Restrictions: No      Mobility Bed Mobility Overal bed mobility: Needs Assistance Bed Mobility: Rolling;Sidelying to Sit Rolling: Min guard Sidelying to sit: Min guard       General bed mobility comments: use of bed rail, minguard for safety and min cues for technique with good  carryover  Transfers Overall transfer level: Needs assistance Equipment used: Straight cane Transfers: Sit to/from Stand Sit to Stand: Min guard         General transfer comment: close minguard for safety to rise into standing and for immediate standing balance    Balance Overall balance assessment: Needs assistance Sitting-balance support: Feet supported Sitting balance-Leahy Scale: Good     Standing balance support: Single extremity supported;During functional activity Standing balance-Leahy Scale: Fair Standing balance comment: able to static stand with minguard assist; reliant on at least single UE support for dynamic mobility                           ADL either performed or assessed with clinical judgement   ADL Overall ADL's : Needs assistance/impaired Eating/Feeding: Set up;Supervision/ safety;Sitting   Grooming: Min guard;Set up;Standing   Upper Body Bathing: Min guard;Sitting   Lower Body Bathing: Minimal assistance;Sit to/from stand   Upper Body Dressing : Min guard;Sitting Upper Body Dressing Details (indicate cue type and reason): reviewed technique for donning overhead shirt Lower Body Dressing: Minimal assistance;Moderate assistance;Sit to/from stand Lower Body Dressing Details (indicate cue type and reason): educated on use of sock aide and reacher for completing LB ADL, pt return demonstrating understanding using AE for clothing over LLE; pt with difficulty managing LLE during LB dressing at baseline; spouse reports can also assist; minA standing balance while advancing pants over hips Toilet Transfer: Minimal assistance;Ambulation;BSC Toilet Transfer Details (indicate cue type and reason): using cane; BSC over toilet Toileting- Clothing Manipulation and Hygiene: Minimal assistance;Sit to/from stand     Tub/Shower Transfer Details (indicate cue type and reason): eudcated on use of shower seat/BSC for use during bathing task  for increased safety  and increasing adherence to precautions during task completion Functional mobility during ADLs: Minimal assistance;Cane General ADL Comments: educated on cervical precautions, safety, AE, and compensatory strategies for completing ADLs while maintaining precautions     Vision         Perception     Praxis      Pertinent Vitals/Pain Pain Assessment: Faces Faces Pain Scale: Hurts little more Pain Location: bil UEs Pain Descriptors / Indicators: Guarding;Sore Pain Intervention(s): Limited activity within patient's tolerance;Monitored during session     Hand Dominance Right   Extremity/Trunk Assessment Upper Extremity Assessment Upper Extremity Assessment: LUE deficits/detail LUE Deficits / Details: pt reports some mild numbness/tingling, overall able to perform functional tasks this AM with increased effort but without significant difficulty LUE Coordination: WNL   Lower Extremity Assessment Lower Extremity Assessment: Defer to PT evaluation   Cervical / Trunk Assessment Cervical / Trunk Assessment: Other exceptions Cervical / Trunk Exceptions: s/p cervical sx   Communication Communication Communication: No difficulties   Cognition Arousal/Alertness: Awake/alert Behavior During Therapy: WFL for tasks assessed/performed Overall Cognitive Status: Within Functional Limits for tasks assessed                                     General Comments       Exercises     Shoulder Instructions      Home Living Family/patient expects to be discharged to:: Private residence Living Arrangements: Spouse/significant other Available Help at Discharge: Family Type of Home: House Home Access: Stairs to enter [employer] of Steps: 1 onto porch, one more to enter home   Home Layout: One level     Bathroom Shower/Tub: Chief Strategy Officer: Standard     Home Equipment: Environmental consultant - 2 wheels;Cane - single point;Bedside commode;Shower seat           Prior Functioning/Environment Level of Independence: Independent with assistive device(s);Needs assistance  Gait / Transfers Assistance Needed: occasionally uses SPC for ambulation ADL's / Homemaking Assistance Needed: spouse intermittently assists with LB ADLs such as socks/shoes            OT Problem List: Decreased strength;Decreased range of motion;Decreased activity tolerance;Impaired balance (sitting and/or standing);Decreased knowledge of precautions;Decreased knowledge of use of DME or AE      OT Treatment/Interventions: Self-care/ADL training;Therapeutic exercise;DME and/or AE instruction;Therapeutic activities    OT Goals(Current goals can be found in the care plan section) Acute Rehab OT Goals Patient Stated Goal: hopeful for home today OT Goal Formulation: With patient Time For Goal Achievement: 08/27/18 Potential to Achieve Goals: Good  OT Frequency: Min 2X/week   Barriers to D/C:            Co-evaluation              AM-PAC PT "6 Clicks" Daily Activity     Outcome Measure Help from another person eating meals?: None Help from another person taking care of personal grooming?: None Help from another person toileting, which includes using toliet, bedpan, or urinal?: A Little Help from another person bathing (including washing, rinsing, drying)?: A Little Help from another person to put on and taking off regular upper body clothing?: None Help from another person to put on and taking off regular lower body clothing?: A Little 6 Click Score: 21   End of Session Equipment Utilized During Treatment: Gait belt;Cervical collar;Other (comment)(cane) Nurse Communication: Mobility status  Activity Tolerance: Patient tolerated treatment well Patient left: with family/visitor present;Other (comment)(sitting EOB)  OT Visit Diagnosis: Unsteadiness on feet (R26.81);Other abnormalities of gait and mobility (R26.89)                Time: 1610-9604 OT Time  Calculation (min): 31 min Charges:  OT General Charges $OT Visit: 1 Visit OT Evaluation $OT Eval Low Complexity: 1 Low OT Treatments $Self Care/Home Management : 8-22 mins  Marcy Siren, OT [facility] Pager [phone] Office [phone]   Orlando Penner 08/13/2018, 12:20 PM

## 2018-08-13 NOTE — Progress Notes (Signed)
Patient is discharged from room 3C09 at this time. Alert and in stable condition. IV site d/c'd and instructions read to patient and spouse with understanding verbalized. Left unit via wheelchair with all belongings at side. 

## 2018-08-16 ENCOUNTER — Ambulatory Visit (INDEPENDENT_AMBULATORY_CARE_PROVIDER_SITE_OTHER): Payer: Medicare HMO | Admitting: Family Medicine

## 2018-08-16 ENCOUNTER — Encounter: Payer: Self-pay | Admitting: Family Medicine

## 2018-08-16 VITALS — BP 108/72 | HR 70 | Temp 98.3°F | Wt 109.2 lb

## 2018-08-16 DIAGNOSIS — N390 Urinary tract infection, site not specified: Secondary | ICD-10-CM | POA: Diagnosis not present

## 2018-08-16 DIAGNOSIS — T83511A Infection and inflammatory reaction due to indwelling urethral catheter, initial encounter: Secondary | ICD-10-CM | POA: Diagnosis not present

## 2018-08-16 LAB — POCT URINALYSIS DIPSTICK
BILIRUBIN UA: NEGATIVE
Glucose, UA: POSITIVE — AB
KETONES UA: NEGATIVE
Leukocytes, UA: NEGATIVE
Nitrite, UA: NEGATIVE
PH UA: 6 (ref 5.0–8.0)
Protein, UA: POSITIVE — AB
RBC UA: NEGATIVE
SPEC GRAV UA: 1.01 (ref 1.010–1.025)
UROBILINOGEN UA: 0.2 U/dL

## 2018-08-16 MED ORDER — CIPROFLOXACIN HCL 500 MG PO TABS: 500.0000 mg | ORAL_TABLET | Freq: Two times a day (BID) | ORAL | 0 refills | Status: DC

## 2018-08-16 MED ORDER — DIAZEPAM 5 MG PO TABS: 5.0000 mg | ORAL_TABLET | Freq: Every day | ORAL | 0 refills | Status: DC

## 2018-08-16 NOTE — Progress Notes (Signed)
   Subjective:    Patient ID: Philip Bates, male    DOB: 1960/12/16, 57 y.o.   MRN: [id number]  HPI Here for a possible UTI. He had surgery on his cervical spine on 08-12-18 and this involved a urinary catheter of course. Now for 2 days he has had increased frequency of urination and his urine looks dark and has a foul odor. No fever. He drinks plenty of water.    Review of Systems  Constitutional: Negative.   Respiratory: Negative.   Cardiovascular: Negative.   Gastrointestinal: Negative.   Genitourinary: Positive for frequency and urgency. Negative for dysuria.       Objective:   Physical Exam  Constitutional: He appears well-developed and well-nourished.  Cardiovascular: Normal rate, regular rhythm, normal heart sounds and intact distal pulses.  Pulmonary/Chest: Effort normal and breath sounds normal.  Abdominal: Soft. Bowel sounds are normal. He exhibits no distension and no mass. There is no tenderness. There is no rebound and no guarding.          Assessment & Plan:  UTI, treat with Cipro. Culture the sample.  Gershon Crane, MD

## 2018-08-17 LAB — URINE CULTURE
MICRO NUMBER:: 91355326
RESULT: NO GROWTH
SPECIMEN QUALITY:: ADEQUATE

## 2018-09-22 ENCOUNTER — Other Ambulatory Visit: Payer: Self-pay | Admitting: Family Medicine

## 2018-10-15 ENCOUNTER — Other Ambulatory Visit (INDEPENDENT_AMBULATORY_CARE_PROVIDER_SITE_OTHER): Payer: Self-pay | Admitting: Orthopedic Surgery

## 2018-10-15 MED ORDER — TRAMADOL HCL 50 MG PO TABS: 50.0000 mg | ORAL_TABLET | Freq: Two times a day (BID) | ORAL | 0 refills | Status: DC | PRN

## 2018-10-15 NOTE — Telephone Encounter (Signed)
Called to pharmacy. Patient advised. He has already made follow up appt for Monday morning.

## 2018-10-15 NOTE — Telephone Encounter (Signed)
Patient called needing Rx refilled (Tramadol) The number to contact patient is [phone]

## 2018-10-15 NOTE — Telephone Encounter (Signed)
Please advise 

## 2018-10-15 NOTE — Telephone Encounter (Signed)
Per Dr. August Saucerean, ok to fill Tramadol 50mg  1 po q 12 hours prn pain #30 with no refills. If patient continues to have problems, he needs to make return office visit.  I left voicemail for patient to call me back.

## 2018-10-16 NOTE — Telephone Encounter (Signed)
thx

## 2018-10-18 ENCOUNTER — Ambulatory Visit (INDEPENDENT_AMBULATORY_CARE_PROVIDER_SITE_OTHER): Payer: Medicare HMO | Admitting: Orthopedic Surgery

## 2018-10-18 ENCOUNTER — Encounter (INDEPENDENT_AMBULATORY_CARE_PROVIDER_SITE_OTHER): Payer: Self-pay | Admitting: Orthopedic Surgery

## 2018-10-18 VITALS — Ht 64.0 in | Wt 118.0 lb

## 2018-10-18 DIAGNOSIS — M75101 Unspecified rotator cuff tear or rupture of right shoulder, not specified as traumatic: Secondary | ICD-10-CM

## 2018-10-18 NOTE — Progress Notes (Signed)
Office Visit Note   Patient: Philip Bates           Date of Birth: [birth year]           MRN: [id number] Visit Date: 10/18/2018 Requested by: [place], Betty G, MD [address] [place], [place] [address] PCP: [place], Betty G, MD  Subjective: Chief Complaint  Patient presents with  . Right Shoulder - Pain    02/02/18 Right shoulder arthroscopy with SAD, Mini Open Rotator Cuff Repair    HPI: Philip Bates is a patient is now 10 months out fairly massive rotator cuff repair.  Been doing reasonably well.  Does have predictable pain with heavy activities.  He did wash his wife's car and his car yesterday and today he is having some shoulder pain.  This is not unexpected based on the size of the tear that he has.  He is doing reasonably well from his neck surgery.              ROS: All systems reviewed are negative as they relate to the chief complaint within the history of present illness.  Patient denies  fevers or chills.   Assessment & Plan: Visit Diagnoses: No diagnosis found.  Plan: Impression is expected amount of postop pain now 10 months out right shoulder massive rotator cuff tear repair.  He is functional with the shoulder achieving abduction and forward flexion both above 90 degrees.  Plan at this time is observation.  Tramadol prescribed.  He is interested in going to pain management.  I will get him set up for that and see him back as needed  Follow-Up Instructions: No follow-ups on file.   Orders:  No orders of the defined types were placed in this encounter.  No orders of the defined types were placed in this encounter.     Procedures: No procedures performed   Clinical Data: No additional findings.  Objective: Vital Signs: Ht 5\' 4"  (1.626 m)   Wt 118 lb (53.5 kg)   BMI 20.25 kg/m   Physical Exam:   Constitutional: Patient appears well-developed HEENT:  Head: Normocephalic Eyes:EOM are normal Neck: Normal range of motion Cardiovascular: Normal  rate Pulmonary/chest: Effort normal Neurologic: Patient is alert Skin: Skin is warm Psychiatric: Patient has normal mood and affect    Ortho Exam: Ortho exam demonstrates full active and passive range of motion of the left shoulder.  The right shoulder has forward flexion and abduction both above 90.  Forward flexion is about 160 abduction is about 100.  Cuff strength is also good to internal and external rotation but slightly weak to supraspinatus testing.  He does have some degree of coarseness and grinding in that right shoulder but not severe.  Incision is intact and the shoulder is not warm.  Specialty Comments:  No specialty comments available.  Imaging: No results found.   PMFS History: Patient Active Problem List   Diagnosis Date Noted  . Cervical pseudoarthrosis (HCC) 08/12/2018  . Erectile dysfunction 02/22/2018  . Cervical spondylosis with radiculopathy 08/11/2017  . Chronic right shoulder pain 06/04/2017  . Radiculopathy, cervical 06/01/2017  . Tobacco use disorder 02/10/2017  . Insomnia 02/10/2017  . Cerebral palsy (HCC) 02/10/2017  . Chronic lower back pain 02/10/2017   Past Medical History:  Diagnosis Date  . Arthritis   . Cerebral palsy (HCC)   . Cervical radiculopathy   . H/O umbilical hernia repair 2001  . Scoliosis   . Spondylosis of cervical spine  Family History  Problem Relation Age of Onset  . Cancer Father        esophagus  . Diabetes Brother   . Diabetes Paternal Uncle     Past Surgical History:  Procedure Laterality Date  . ANTERIOR CERVICAL DECOMPRESSION/DISCECTOMY FUSION 4 LEVELS N/A 08/11/2017   Procedure: Cervical three to Cervical seven Anterior Cervical discectomy with fusion/plate fixation;  Surgeon: Ditty, Loura Halt, MD;  Location: [facility] OR;  Service: Neurosurgery;  Laterality: N/A;  . BACK SURGERY    . HERNIA REPAIR    . LEG SURGERY    . POSTERIOR CERVICAL FUSION/FORAMINOTOMY N/A 08/12/2018   Procedure: POSTERIOR CERVICAL  FUSION WITH LATERAL MASS FIXATION CERVICAL THREE TO CERVICAL SEVEN;  Surgeon: Lisbeth Renshaw, MD;  Location: [facility] OR;  Service: Neurosurgery;  Laterality: N/A;  . SHOULDER ARTHROSCOPY WITH ROTATOR CUFF REPAIR AND SUBACROMIAL DECOMPRESSION Right 02/02/2018   Procedure: RIGHT SHOULDER ARTHROSCOPY WITH SUBACROMIAL DECOMPRESSION, MINI-OPEN ROTATOR CUFF REPAIR;  Surgeon: Cammy Copa, MD;  Location: [facility] OR;  Service: Orthopedics;  Laterality: Right;  . SHOULDER SURGERY Right    Social History   Occupational History  . Not on file  Tobacco Use  . Smoking status: Current Every Day Smoker    Packs/day: 0.50    Types: Cigarettes  . Smokeless tobacco: Never Used  . Tobacco comment: using patch; cut back to half pack/day.   Substance and Sexual Activity  . Alcohol use: No  . Drug use: No  . Sexual activity: Yes

## 2018-10-21 ENCOUNTER — Telehealth (INDEPENDENT_AMBULATORY_CARE_PROVIDER_SITE_OTHER): Payer: Self-pay | Admitting: Orthopedic Surgery

## 2018-10-21 NOTE — Telephone Encounter (Signed)
Please advise. Last had tramadol refilled 10/15/2018 1 po bid #30.

## 2018-10-21 NOTE — Telephone Encounter (Signed)
Patient called and stated need refill of Percocet or Tramadol.  Please call patient to advise [phone]

## 2018-10-21 NOTE — Telephone Encounter (Signed)
Ok to rf tramadol on 1 25 pls cala lthx

## 2018-10-22 MED ORDER — TRAMADOL HCL 50 MG PO TABS: 50.0000 mg | ORAL_TABLET | Freq: Two times a day (BID) | ORAL | 0 refills | Status: DC | PRN

## 2018-10-22 NOTE — Telephone Encounter (Signed)
I called refill to pharmacy. Advised ok to refill on 10/30/2018.

## 2018-10-22 NOTE — Telephone Encounter (Signed)
I called patient. He thought his instructions were 1 po q 4-6 hours.  He looked back at his bottle and realized it was 1 po q 12.  He states that he still has some left. I advised ok for refill on 10/30/2018.  Patient expressed understanding.

## 2018-11-03 ENCOUNTER — Other Ambulatory Visit: Payer: Self-pay | Admitting: Family Medicine

## 2018-11-03 IMAGING — MR MR SHOULDER*R* W/O CM
4 of 5 series · 28 of 40 positions shown · non-contrast
Comparison: Right shoulder x-rays dated October 08, 2017 the.

CLINICAL DATA: Increasing right shoulder pain with limited range of
motion over the past 3 years. Prior surgery.

EXAM:
MRI OF THE RIGHT SHOULDER WITHOUT CONTRAST
TECHNIQUE: Multiplanar, multisequence MR imaging of the shoulder was performed.
No intravenous contrast was administered.

[Series 3: T2 fat-sat · axial · 4.0mm · 0.25mm/px · z∈[-60,+25]mm · 8 of 20 slices shown (1 of 3)]
[im 1/20]
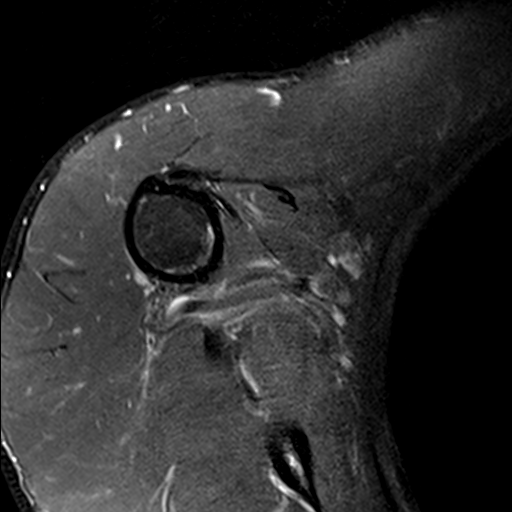
[im 3/20]
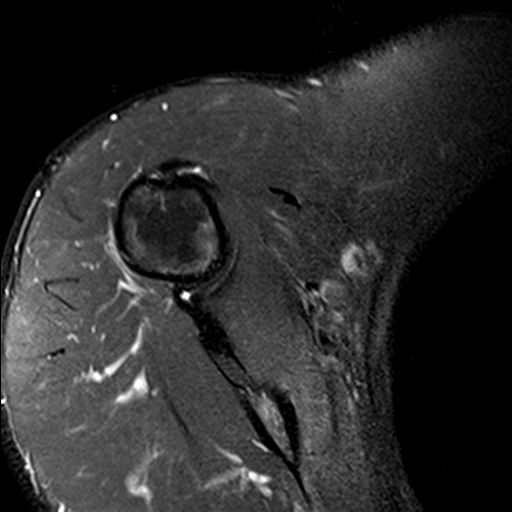
[im 7/20]
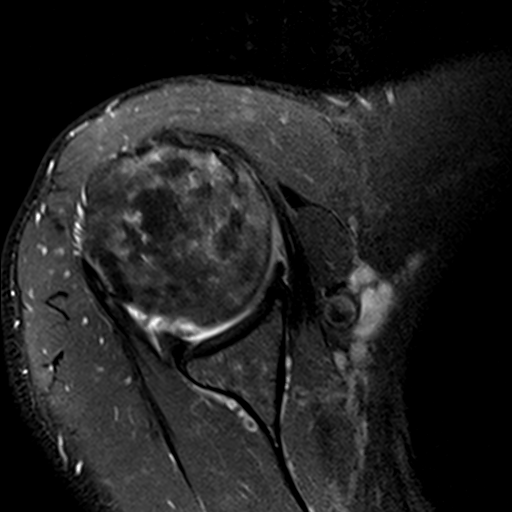
[im 9/20]
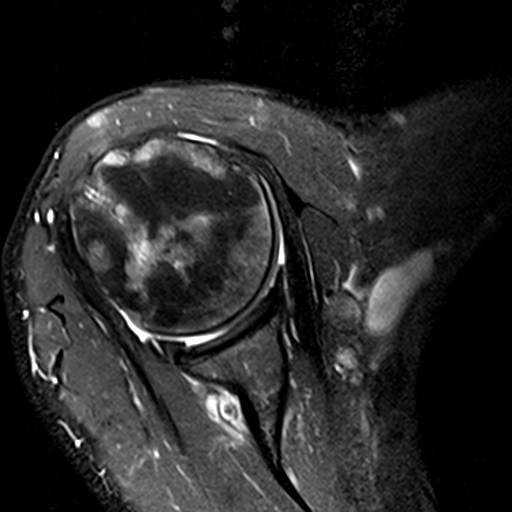
[im 11/20]
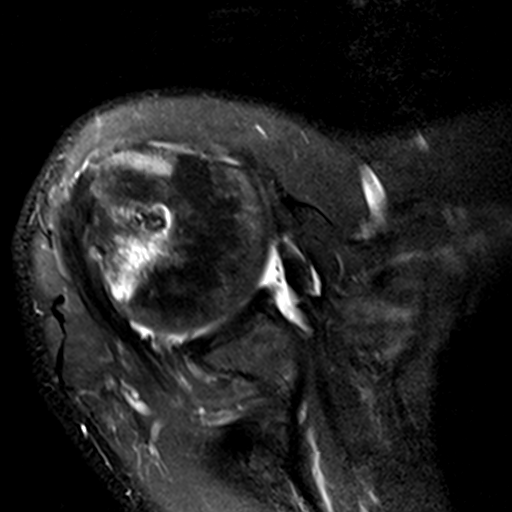
[im 13/20]
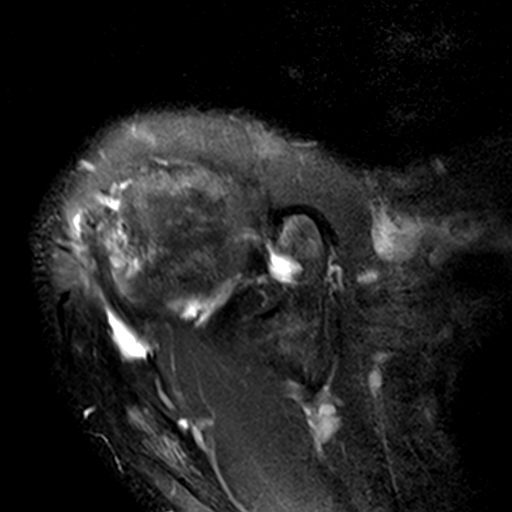
[im 17/20]
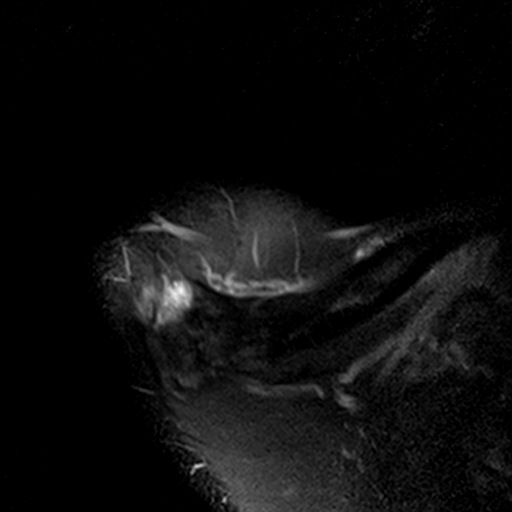
[im 20/20]
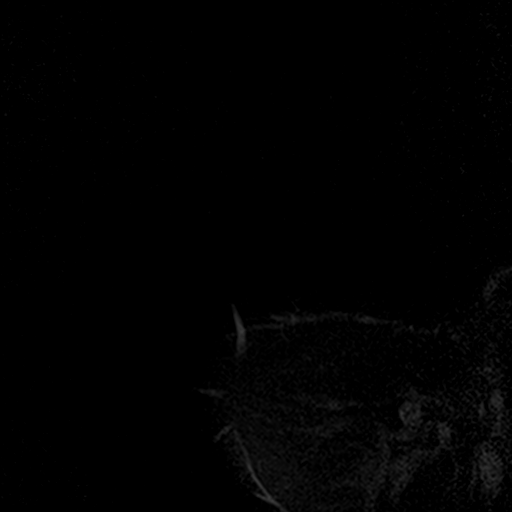

[Series 5: T2 fat-sat · coronal · 4.0mm · 0.55mm/px · 8 of 17 slices shown (2 of 3)]
[im 1/17]
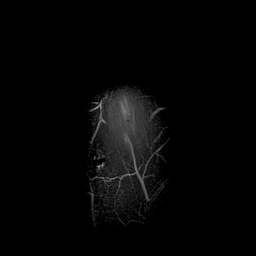
[im 3/17]
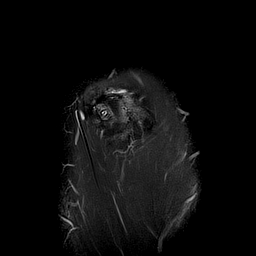
[im 5/17]
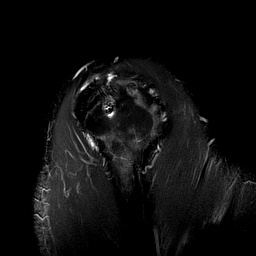
[im 7/17]
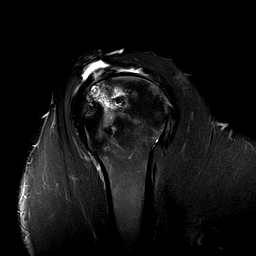
[im 10/17]
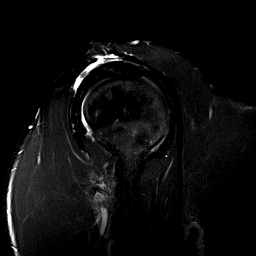
[im 12/17]
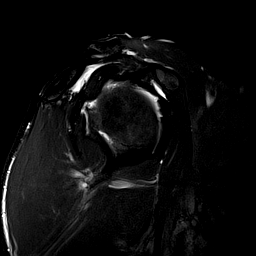
[im 14/17]
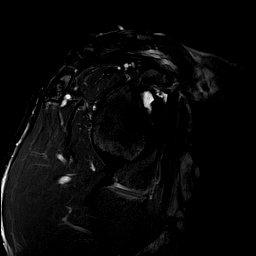
[im 17/17]
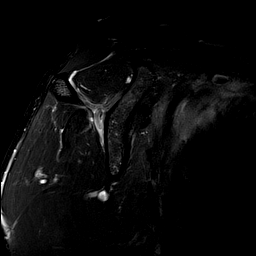

[Series 6: T2 fat-sat · sagittal · 4.0mm · 0.55mm/px · 5 of 15 slices shown (3 of 3)]
[im 1/15]
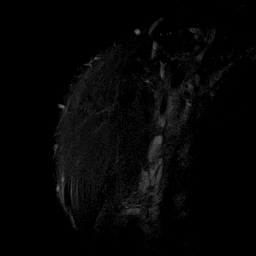
[im 3/15]
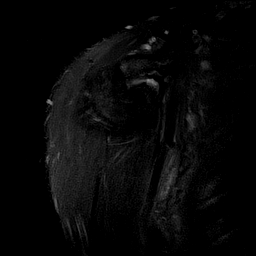
[im 5/15]
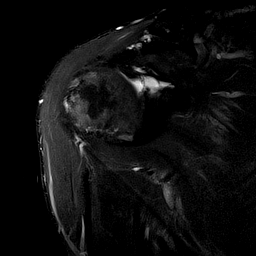
[im 8/15]
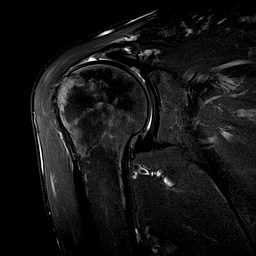
[im 12/15]
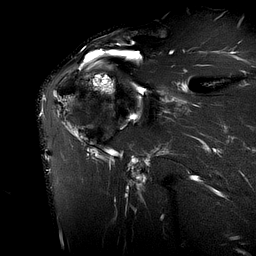

[Series 7: PD · sagittal · 4.0mm · 0.27mm/px · 7 of 15 slices shown]
[im 1/15]
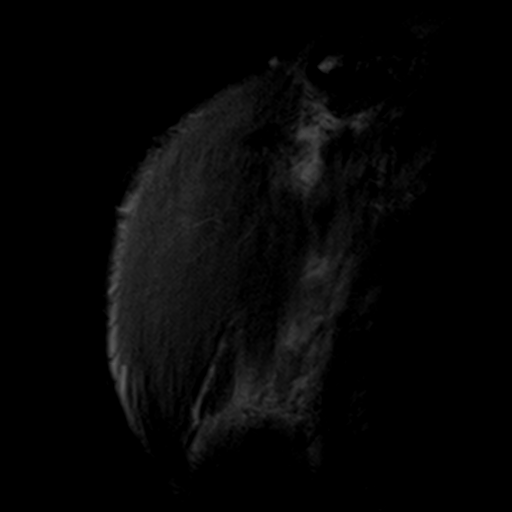
[im 3/15]
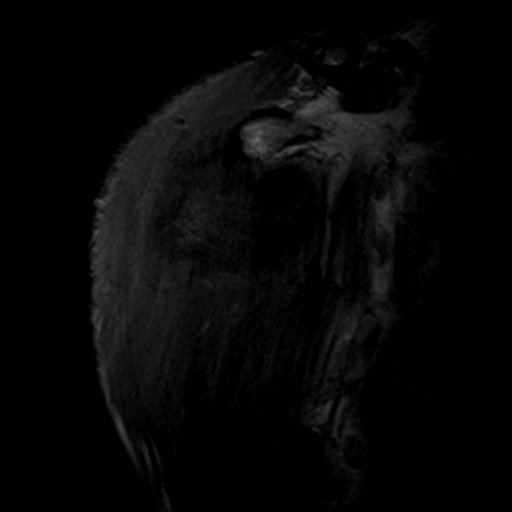
[im 5/15]
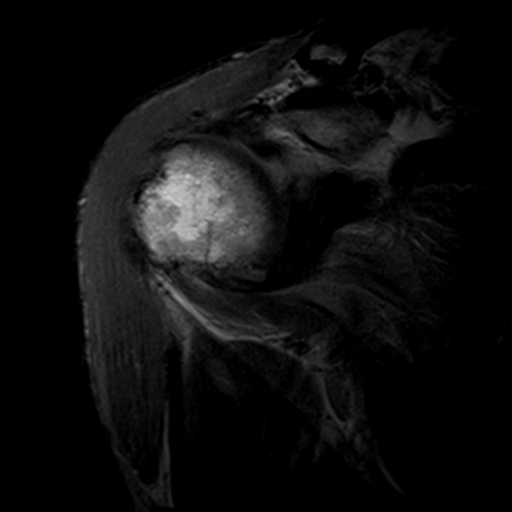
[im 8/15]
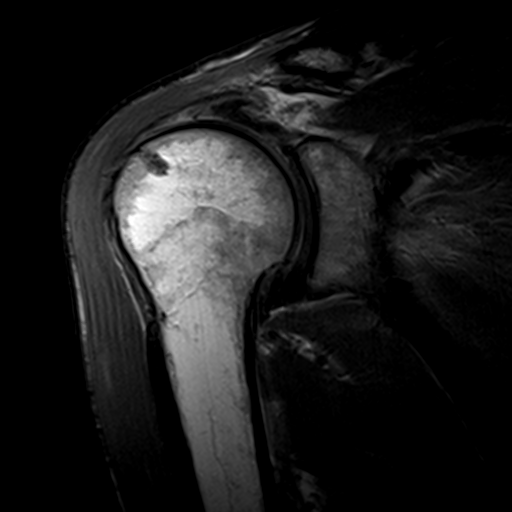
[im 10/15]
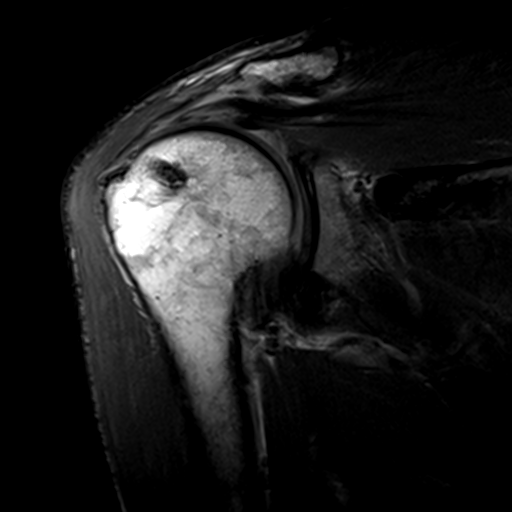
[im 12/15]
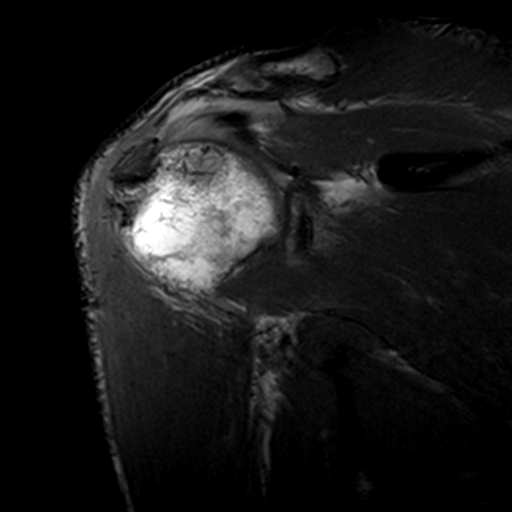
[im 15/15]
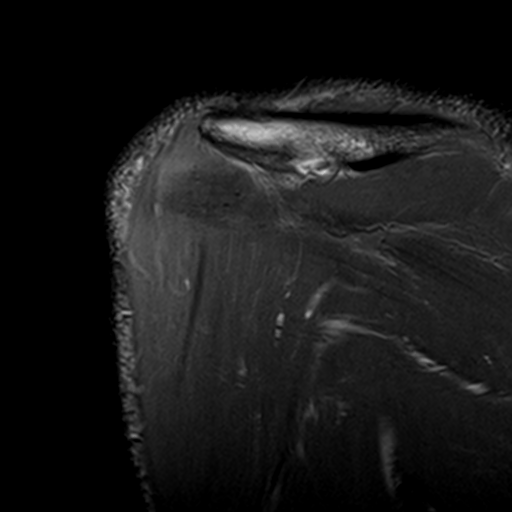

[28 of 40 positions shown; findings below may reference images not displayed]

FINDINGS: Rotator cuff: Evidence of prior rotator cuff repair with new
full-thickness, near full width tear of the supraspinatus tendon,
with approximately 2.8 cm of retraction to the mid humeral head.
Moderate infraspinatus tendinosis without discrete tear. The teres
minor is intact. There is thinning of the distal subscapularis
tendon with partial-thickness articular surface tearing.

Muscles: Mild fatty infiltration of the subscapularis muscle. No
muscle edema.

Biceps long head: The intra-articular biceps tendon is not well
seen.

Acromioclavicular Joint: Prior distal clavicle resection and
acromioplasty. Small subacromial/subdeltoid bursal fluid.

Glenohumeral Joint: Partial-thickness cartilage loss in the
glenohumeral joint. No joint effusion.

Labrum:  Degeneration of the superior and posterosuperior labrum.

Bones: Prominent reactive changes in the posterior humeral head. No
fracture or dislocation.

Other: None.
IMPRESSION: 1. Full-thickness, near full width tear of the supraspinatus tendon,
with retraction to the mid humeral head.
2. Partial-thickness articular surface tear of the distal
subscapularis tendon.
3. Moderate infraspinatus tendinosis.
4. Poor visualization of the intra-articular biceps tendon.
Correlate for history of prior tenotomy.
5. Mild glenohumeral osteoarthritis.

## 2018-11-03 NOTE — Telephone Encounter (Signed)
Dr. Fry please advise on refill. Thanks  
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
## 2018-11-03 NOTE — Telephone Encounter (Signed)
Copied from CRM [id number]. Topic: Quick Communication - Rx Refill/Question >> Nov 03, 2018  8:38 AM Gaynelle Adu wrote: Medication: diazepam (VALIUM) 5 MG tablet    Has the patient contacted their pharmacy? No   Preferred Pharmacy (with phone number or street name): [phone] [address] [address], [place] - [address] [phone] (Phone) [phone] (Fax)    Agent: Please be advised that RX refills may take up to 3 business days. We ask that you follow-up with your pharmacy.

## 2018-11-05 NOTE — Telephone Encounter (Signed)
Patient is calling in to check status of refill request

## 2018-11-08 ENCOUNTER — Other Ambulatory Visit: Payer: Self-pay | Admitting: *Deleted

## 2018-11-08 NOTE — Telephone Encounter (Signed)
He needs to ask Dr. [place]

## 2018-11-08 NOTE — Telephone Encounter (Signed)
Attempted to contact patient in regards to his rx refill. No answer. Left a voicemail for patient to return call. CRM created

## 2018-11-09 ENCOUNTER — Ambulatory Visit (INDEPENDENT_AMBULATORY_CARE_PROVIDER_SITE_OTHER): Payer: Medicare HMO | Admitting: Family Medicine

## 2018-11-09 ENCOUNTER — Encounter: Payer: Self-pay | Admitting: *Deleted

## 2018-11-09 ENCOUNTER — Encounter: Payer: Self-pay | Admitting: Family Medicine

## 2018-11-09 VITALS — BP 120/78 | HR 97 | Temp 98.7°F | Resp 12 | Ht 64.0 in | Wt 112.5 lb

## 2018-11-09 DIAGNOSIS — M5412 Radiculopathy, cervical region: Secondary | ICD-10-CM

## 2018-11-09 DIAGNOSIS — G809 Cerebral palsy, unspecified: Secondary | ICD-10-CM | POA: Diagnosis not present

## 2018-11-09 DIAGNOSIS — G4700 Insomnia, unspecified: Secondary | ICD-10-CM | POA: Diagnosis not present

## 2018-11-09 MED ORDER — DIAZEPAM 5 MG PO TABS: 5.0000 mg | ORAL_TABLET | Freq: Every day | ORAL | 3 refills | Status: DC

## 2018-11-09 NOTE — Assessment & Plan Note (Signed)
Handicap sticker signed. Continue Valium 5 mg at bedtime to help with contracture and muscle spasms.

## 2018-11-09 NOTE — Progress Notes (Signed)
HPI:   PhilipThang Bates is a 58 y.o. male, who is here today to follow on insomnia and muscle contractions  I last saw him on Feb 22, 2018 for acute visit. He has history of cervical pain with radiculopathy, status post anterior cervical discectomy and fusion in 08/2017. Trouble sleeping due to cervical limitation of range of motion and pain, cannot find a comfortable position.    He is on Valium 5 mg daily at bedtime, which has helped with his sleep. Also muscle contractions and spasm related to CP exacerbates problem. He feels like medication relaxes neck muscles and feels more comfortable in bed, allowing him to sleep better. Sleeps "for a good 6-7 hours."  He denies side effects from medication.   Chronic pain, mainly right shoulder.  Pain is severe, aggravated by certain activities. No significant limitation of shoulder range of motion. Cervical pain, limited ROM.  He is following with Dr. August Saucer, status post rotator cuff repair about 10 months ago. He is currently on tramadol 50 mg twice daily as needed and methocarbamol 50 mg every 6 hours as needed. He is also on gabapentin 300 mg 3 times daily.   According to patient, he was recently referred to pain management and awaiting for appointment information.  He is also requesting a handicap sticker.  Review of Systems  Constitutional: Negative for chills, fatigue and fever.  Respiratory: Negative for chest tightness and shortness of breath.   Cardiovascular: Negative for palpitations and leg swelling.  Gastrointestinal: Negative for abdominal pain, nausea and vomiting.  Musculoskeletal: Positive for arthralgias, back pain, gait problem and neck pain. Negative for joint swelling.  Skin: Negative for rash and wound.  Psychiatric/Behavioral: Positive for sleep disturbance. The patient is nervous/anxious.       Current Outpatient Medications on File Prior to Visit  Medication Sig Dispense Refill  . docusate sodium  (COLACE) 100 MG capsule Take 1 capsule (100 mg total) by mouth 2 (two) times daily. 60 capsule 0  . gabapentin (NEURONTIN) 300 MG capsule Take 1 capsule (300 mg total) by mouth 3 (three) times daily. 90 capsule 0  . methocarbamol (ROBAXIN) 500 MG tablet Take 1 tablet (500 mg total) by mouth every 6 (six) hours as needed for muscle spasms. 50 tablet 1  . Multiple Vitamin (MULTIVITAMIN WITH MINERALS) TABS tablet Take 1 tablet by mouth daily.    . mupirocin ointment (BACTROBAN) 2 % APPLY TWICE DAILY FOR 5 DAYS  0  . traMADol (ULTRAM) 50 MG tablet Take 1 tablet (50 mg total) by mouth every 12 (twelve) hours as needed for moderate pain. 30 tablet 0   No current facility-administered medications on file prior to visit.      Past Medical History:  Diagnosis Date  . Arthritis   . Cerebral palsy (HCC)   . Cervical radiculopathy   . H/O umbilical hernia repair 2001  . Scoliosis   . Spondylosis of cervical spine    Allergies  Allergen Reactions  . Naproxen Other (See Comments)    In high stomach  Irritation   . Flexeril [Cyclobenzaprine] Nausea Only    Social History   Socioeconomic History  . Marital status: Married    Spouse name: Not on file  . Number of children: Not on file  . Years of education: Not on file  . Highest education level: Not on file  Occupational History  . Not on file  Social Needs  . Financial resource strain: Not on file  .  Food insecurity:    Worry: Not on file    Inability: Not on file  . Transportation needs:    Medical: Not on file    Non-medical: Not on file  Tobacco Use  . Smoking status: Current Every Day Smoker    Packs/day: 0.50    Types: Cigarettes  . Smokeless tobacco: Never Used  . Tobacco comment: using patch; cut back to half pack/day.   Substance and Sexual Activity  . Alcohol use: No  . Drug use: No  . Sexual activity: Yes  Lifestyle  . Physical activity:    Days per week: Not on file    Minutes per session: Not on file  . Stress:  Not on file  Relationships  . Social connections:    Talks on phone: Not on file    Gets together: Not on file    Attends religious service: Not on file    Active member of club or organization: Not on file    Attends meetings of clubs or organizations: Not on file    Relationship status: Not on file  Other Topics Concern  . Not on file  Social History Narrative  . Not on file    Vitals:   11/09/18 1230  BP: 120/78  Pulse: 97  Resp: 12  Temp: 98.7 F (37.1 C)  SpO2: 99%   Body mass index is 19.31 kg/m.   Physical Exam  Nursing note and vitals reviewed. Constitutional: He is oriented to person, place, and time. He appears well-developed and well-nourished. He does not appear ill. No distress.  HENT:  Head: Normocephalic and atraumatic.  Eyes: Conjunctivae are normal.  Cardiovascular: Normal rate and regular rhythm.  No murmur heard. Respiratory: Effort normal. No respiratory distress.  Musculoskeletal:        General: No edema.     Cervical back: He exhibits decreased range of motion. He exhibits no tenderness and no bony tenderness.     Thoracic back: He exhibits no tenderness and no bony tenderness.     Comments: Shoulder with no significant limitation or abduction,no pain elicited. His cardiologist. Limb pain, left lower extremity muscle atrophy. Contractions on LE L>R  Neurological: He is alert and oriented to person, place, and time. Gait (Not assisted) abnormal.  No focal deficit appreciated.  Skin: Skin is warm. No rash noted. No erythema.  Psychiatric: He has a normal mood and affect.  Well groom,good eye contact.    ASSESSMENT AND PLAN:  Mr. [name] was seen today for medication refill.  Diagnoses and all orders for this visit:  Insomnia Valium 5 mg at bedtime is helping. No changes in current management. Some side effects of benzodiazepines discussed. Medication contract signed today. Follow-up in 5 to 6 months, before if needed.  Cerebral  palsy (HCC) Handicap sticker signed. Continue Valium 5 mg at bedtime to help with contracture and muscle spasms.   Radiculopathy, cervical Continue following with pain management, appointment is pending. No changes in gabapentin, tramadol, or methocarbamol.   Return in about 5 months (around 04/09/2019) for f/u + routine cpe.     Aryka Coonradt G. SwazilandJordan, MD  [facility]. [place] office.

## 2018-11-09 NOTE — Assessment & Plan Note (Signed)
Valium 5 mg at bedtime is helping. No changes in current management. Some side effects of benzodiazepines discussed. Medication contract signed today. Follow-up in 5 to 6 months, before if needed.

## 2018-11-09 NOTE — Assessment & Plan Note (Signed)
Continue following with pain management, appointment is pending. No changes in gabapentin, tramadol, or methocarbamol.

## 2018-11-09 NOTE — Patient Instructions (Signed)
A few things to remember from today's visit:   Insomnia, unspecified type - Plan: diazepam (VALIUM) 5 MG tablet  Cerebral palsy, unspecified type (HCC) - Plan: diazepam (VALIUM) 5 MG tablet   Please be sure medication list is accurate. If a new problem present, please set up appointment sooner than planned today.

## 2018-11-15 ENCOUNTER — Telehealth (INDEPENDENT_AMBULATORY_CARE_PROVIDER_SITE_OTHER): Payer: Self-pay | Admitting: Orthopedic Surgery

## 2018-11-15 ENCOUNTER — Other Ambulatory Visit (INDEPENDENT_AMBULATORY_CARE_PROVIDER_SITE_OTHER): Payer: Self-pay

## 2018-11-15 ENCOUNTER — Other Ambulatory Visit (INDEPENDENT_AMBULATORY_CARE_PROVIDER_SITE_OTHER): Payer: Self-pay | Admitting: Orthopedic Surgery

## 2018-11-15 DIAGNOSIS — G8929 Other chronic pain: Secondary | ICD-10-CM

## 2018-11-15 DIAGNOSIS — M75101 Unspecified rotator cuff tear or rupture of right shoulder, not specified as traumatic: Secondary | ICD-10-CM

## 2018-11-15 DIAGNOSIS — M25511 Pain in right shoulder: Secondary | ICD-10-CM

## 2018-11-15 NOTE — Telephone Encounter (Signed)
Should we enter another pain management referral or can we trysending the existing referral to another pain management clinic? He was denied by Dr. Jodean Lima office?

## 2018-11-15 NOTE — Telephone Encounter (Signed)
Please see below and advise about medication refill. Was also denied pain management referral what are next steps?

## 2018-11-15 NOTE — Telephone Encounter (Signed)
Done

## 2018-11-15 NOTE — Telephone Encounter (Signed)
Referral made to another pain management facility do you wish to refill while pending?

## 2018-11-15 NOTE — Telephone Encounter (Signed)
Will we cannot really do pain management for this person.  We can refer him to another pain management person or refill tramadol x1 only.  Best bet would be to try different pain management.  He may be out of luck  because it may not be easy for him to get the kind of treatment that I think he may want.

## 2018-11-15 NOTE — Telephone Encounter (Signed)
Yes, please enter new referral. Thanks

## 2018-11-15 NOTE — Telephone Encounter (Signed)
Pharmacy  [facility] [address]    Medication refill  Tramadol     Patient is also concerned about his pain management  if the referral I  saw in the system is updated it stated they had nothing to offer the patient at this time and it was denied. Please call patient to advise

## 2018-11-16 ENCOUNTER — Other Ambulatory Visit (INDEPENDENT_AMBULATORY_CARE_PROVIDER_SITE_OTHER): Payer: Self-pay

## 2018-11-16 MED ORDER — TRAMADOL HCL 50 MG PO TABS: 50.0000 mg | ORAL_TABLET | Freq: Two times a day (BID) | ORAL | 0 refills | Status: DC | PRN

## 2018-11-16 NOTE — Telephone Encounter (Signed)
Okay to refill please call thanks

## 2018-12-14 ENCOUNTER — Encounter: Payer: Self-pay | Admitting: Family Medicine

## 2018-12-14 ENCOUNTER — Ambulatory Visit (INDEPENDENT_AMBULATORY_CARE_PROVIDER_SITE_OTHER): Payer: Medicare HMO | Admitting: Family Medicine

## 2018-12-14 VITALS — BP 118/80 | HR 76 | Temp 98.4°F | Resp 12 | Ht 64.0 in | Wt 111.2 lb

## 2018-12-14 DIAGNOSIS — R1031 Right lower quadrant pain: Secondary | ICD-10-CM | POA: Diagnosis not present

## 2018-12-14 DIAGNOSIS — N401 Enlarged prostate with lower urinary tract symptoms: Secondary | ICD-10-CM | POA: Diagnosis not present

## 2018-12-14 DIAGNOSIS — R350 Frequency of micturition: Secondary | ICD-10-CM | POA: Diagnosis not present

## 2018-12-14 DIAGNOSIS — R351 Nocturia: Secondary | ICD-10-CM | POA: Diagnosis not present

## 2018-12-14 DIAGNOSIS — G4701 Insomnia due to medical condition: Secondary | ICD-10-CM | POA: Diagnosis not present

## 2018-12-14 LAB — POCT URINALYSIS DIPSTICK
BILIRUBIN UA: NEGATIVE
Blood, UA: NEGATIVE
GLUCOSE UA: NEGATIVE
KETONES UA: NEGATIVE
Leukocytes, UA: NEGATIVE
Nitrite, UA: NEGATIVE
ODOR: NEGATIVE
PH UA: 6 (ref 5.0–8.0)
Protein, UA: NEGATIVE
Spec Grav, UA: 1.015 (ref 1.010–1.025)
Urobilinogen, UA: 0.2 E.U./dL

## 2018-12-14 LAB — PSA: PSA: 2.57 ng/mL (ref 0.10–4.00)

## 2018-12-14 MED ORDER — TAMSULOSIN HCL 0.4 MG PO CAPS: 0.4000 mg | ORAL_CAPSULE | Freq: Every day | ORAL | 3 refills | Status: DC

## 2018-12-14 NOTE — Assessment & Plan Note (Signed)
Symptoms he is reporting today could be related to this problem. Urine dipstick today negative. He was having similar symptoms in 08/2018, urine culture with no growth. After discussion of some side effects, he agrees with trying Flomax 0.4 mg daily.

## 2018-12-14 NOTE — Assessment & Plan Note (Signed)
Aggravated by pain and cervical contractures. We discussed some side effects of Valium but because medication is helping I recommend continuing it. Good sleep hygiene. Avoid taking it at the same time with tramadol. Follow-up in 5 to 6 months.

## 2018-12-14 NOTE — Patient Instructions (Addendum)
A few things to remember from today's visit:   Urinary frequency - Plan: POC Urinalysis Dipstick  Groin pain, right - Plan: Ambulatory referral to General Surgery  BPH associated with nocturia - Plan: PSA, tamsulosin (FLOMAX) 0.4 MG CAPS capsule   Please be sure medication list is accurate. If a new problem present, please set up appointment sooner than planned today.

## 2018-12-14 NOTE — Assessment & Plan Note (Signed)
Valium seems to help with cervical muscle relaxation. He has tried different medication in the past, unsuccessfully. No changes in current management.

## 2018-12-14 NOTE — Progress Notes (Signed)
HPI:  Chief Complaint  Patient presents with  . Urinary Frequency  . Groin Pain    off and on  . Medication Refill    Mr.Knight Susman is a 58 y.o. male, who is here today for insomnia follow-up.  He was last seen on 11/09/2018. He is following with pain management, currently he is on tramadol 50 mg twice daily.  He has been on Valium 5 mg at bedtime, which helps with muscle contractions, cervical pain, and therefore with sleep. He has not noted side effects but he is afraid of continuing due to risk of side effects.  According to patient, he was told that he could not take tramadol with Valium.  Insomnia, aggravated by neck pain.  History of cervical osteoarthrosis and cervicalgia, on 08/12/2018 he underwent cervical surgery.  He thinks he may have a UTI because urinary frequency. He states that symptoms have been going on for a couple days. Nocturia x4. He denies fever, chills, abdominal pain, dysuria, gross hematuria, rectal pain, or urgency. He is reporting history of BPH. Last PSA when he was 52.  He thinks symptoms are related to epididymitis. He denies urethral discharge or testicular pain.   He is also complaining of right groin pain that also started a couple days ago. No history of trauma. Pain is intermittently, he has not identified exacerbating or alleviating factors. No history of abdominal surgery. No changes in bowel habits.   Review of Systems  Constitutional: Negative for appetite change, chills, fatigue and fever.  Respiratory: Negative for cough, shortness of breath and wheezing.   Cardiovascular: Negative for palpitations and leg swelling.  Gastrointestinal: Negative for abdominal pain, nausea and vomiting.       No changes in bowel habits.  Genitourinary: Positive for frequency. Negative for decreased urine volume, discharge, dysuria, flank pain, hematuria, scrotal swelling and urgency.  Musculoskeletal: Positive for back pain, gait  problem, myalgias and neck pain.  Skin: Negative for rash.  Neurological: Negative for weakness and headaches.  Psychiatric/Behavioral: Positive for sleep disturbance. Negative for confusion.   Current Outpatient Medications on File Prior to Visit  Medication Sig Dispense Refill  . docusate sodium (COLACE) 100 MG capsule Take 1 capsule (100 mg total) by mouth 2 (two) times daily. 60 capsule 0  . gabapentin (NEURONTIN) 300 MG capsule Take 1 capsule (300 mg total) by mouth 3 (three) times daily. 90 capsule 0  . methocarbamol (ROBAXIN) 500 MG tablet Take 1 tablet (500 mg total) by mouth every 6 (six) hours as needed for muscle spasms. 50 tablet 1  . Multiple Vitamin (MULTIVITAMIN WITH MINERALS) TABS tablet Take 1 tablet by mouth daily.    . mupirocin ointment (BACTROBAN) 2 % APPLY TWICE DAILY FOR 5 DAYS  0  . NARCAN 4 MG/0.1ML LIQD nasal spray kit INSTILL1 (ONE) SPRAY AS NEEDED    . traMADol (ULTRAM) 50 MG tablet TAKE 1 TABLET EVERY 12 HOURS AS NEEDED FOR PAIN*FILL 10-30-18* 30 tablet 0  . traMADol (ULTRAM) 50 MG tablet Take 1 tablet (50 mg total) by mouth every 12 (twelve) hours as needed for moderate pain. 30 tablet 0  . Vitamin D, Ergocalciferol, (DRISDOL) 1.25 MG (50000 UT) CAPS capsule TAKE 1 CAPSULE ONE TIME PER WEEK    . diazepam (VALIUM) 5 MG tablet Take 1 tablet (5 mg total) by mouth at bedtime. (Patient not taking: Reported on 12/14/2018) 30 tablet 3   No current facility-administered medications on file prior to visit.  Past Medical History:  Diagnosis Date  . Arthritis   . Cerebral palsy ([place])   . Cervical radiculopathy   . H/O umbilical hernia repair 3086  . Scoliosis   . Spondylosis of cervical spine    Allergies  Allergen Reactions  . Naproxen Other (See Comments)    In high stomach  Irritation   . Flexeril [Cyclobenzaprine] Nausea Only    Social History   Socioeconomic History  . Marital status: Married    Spouse name: Not on file  . Number of children: Not  on file  . Years of education: Not on file  . Highest education level: Not on file  Occupational History  . Not on file  Social Needs  . Financial resource strain: Not on file  . Food insecurity:    Worry: Not on file    Inability: Not on file  . Transportation needs:    Medical: Not on file    Non-medical: Not on file  Tobacco Use  . Smoking status: Current Every Day Smoker    Packs/day: 0.50    Types: Cigarettes  . Smokeless tobacco: Never Used  . Tobacco comment: using patch; cut back to half pack/day.   Substance and Sexual Activity  . Alcohol use: No  . Drug use: No  . Sexual activity: Yes  Lifestyle  . Physical activity:    Days per week: Not on file    Minutes per session: Not on file  . Stress: Not on file  Relationships  . Social connections:    Talks on phone: Not on file    Gets together: Not on file    Attends religious service: Not on file    Active member of club or organization: Not on file    Attends meetings of clubs or organizations: Not on file    Relationship status: Not on file  Other Topics Concern  . Not on file  Social History Narrative  . Not on file    Vitals:   12/14/18 1209  BP: 118/80  Pulse: 76  Resp: 12  Temp: 98.4 F (36.9 C)  SpO2: 97%   Body mass index is 19.1 kg/m.   Physical Exam  Nursing note and vitals reviewed. Constitutional: He is oriented to person, place, and time. He appears well-developed and well-nourished. No distress.  HENT:  Head: Normocephalic and atraumatic.  Eyes: Conjunctivae are normal.  Cardiovascular: Normal rate and regular rhythm.  No murmur heard. Respiratory: Effort normal and breath sounds normal.  GI: Soft. He exhibits no distension. There is abdominal tenderness in the right lower quadrant. There is no rebound and no guarding. A hernia is present. Hernia confirmed positive in the right inguinal area (?, small protruction palpated upon valsalva.).    Genitourinary: Right testis shows no  mass and no tenderness. Left testis shows no mass and no tenderness.    Genitourinary Comments: Left testicle is bigger than left, reported as is normal. No tenderness upon palpation. Refused rectal exam.   Musculoskeletal:     Cervical back: He exhibits decreased range of motion. He exhibits no tenderness and no bony tenderness.     Thoracic back: He exhibits deformity (scoliosis).     Comments: LE muscle atrophy and LLE contractions.   Lymphadenopathy:       Right: No inguinal adenopathy present.       Left: No inguinal adenopathy present.  Neurological: He is alert and oriented to person, place, and time. He has normal strength. Gait abnormal.  ASSESSMENT AND PLAN:   [name] was seen today for follow-up.  Orders Placed This Encounter  Procedures  . PSA  . Ambulatory referral to General Surgery  . POC Urinalysis Dipstick    Urinary frequency Urine dipstick negative. Hx does not suggest UTI. Possible etiologies discussed. Ucx in 08/2018 when he was having same symptoms,no growth; so Ucx was not ordered today.  - POC Urinalysis Dipstick  Groin pain, right ? Inguinal hernia. Instructed about warning signs.  - Ambulatory referral to General Surgery  BPH associated with nocturia Symptoms he is reporting today could be related to this problem. Urine dipstick today negative. He was having similar symptoms in 08/2018, urine culture with no growth. After discussion of some side effects, he agrees with trying Flomax 0.4 mg daily.  Cervical pseudoarthrosis (HCC) Valium seems to help with cervical muscle relaxation. He has tried different medication in the past, unsuccessfully. No changes in current management.   Insomnia Aggravated by pain and cervical contractures. We discussed some side effects of Valium but because medication is helping I recommend continuing it. Good sleep hygiene. Avoid taking it at the same time with tramadol. Follow-up in 5 to 6  months.    Return in about 6 months (around 06/16/2019) for insomnia,contractions..    Ashayla Subia G. [place], MD  [facility]. [place] office.

## 2019-01-05 ENCOUNTER — Telehealth: Payer: Self-pay | Admitting: Family Medicine

## 2019-01-05 NOTE — Telephone Encounter (Unsigned)
Copied from CRM [id number]. Topic: Quick Communication - Rx Refill/Question >> Jan 05, 2019  2:22 PM Wyonia Hough E wrote: Medication: Dr. Swaziland prescribed diazepam (VALIUM) 5 MG tablet for the Pt but the Pt's Pain management provider prescribes him Percocet and doesn't want the Pt to take both of these medications. The pain management provider will continue to prescribe the percocet. Pt is needing another medication called in that will not counter act with the percocet / please advise if Pt needs an appt

## 2019-01-06 NOTE — Telephone Encounter (Signed)
Message sent to Dr. Jordan for review and approval. 

## 2019-01-07 ENCOUNTER — Other Ambulatory Visit: Payer: Self-pay | Admitting: Family Medicine

## 2019-01-07 MED ORDER — TIZANIDINE HCL 4 MG PO TABS: 4.0000 mg | ORAL_TABLET | Freq: Every day | ORAL | 1 refills | Status: DC

## 2019-01-07 NOTE — Telephone Encounter (Signed)
Prescription for Zanaflex 4 mg to take at bedtime was sent to his pharmacy. Thanks, BJ

## 2019-01-07 NOTE — Telephone Encounter (Signed)
Spoke with patient and he stated that he would try the Zanaflex, [facility] [address]

## 2019-01-07 NOTE — Telephone Encounter (Signed)
We already had this discussion during last appt. Ideally we do not want pts on opioids and benzos, but he is on a low dose.  Other options: Flexeril,Zanaflex,Methocarbamol,or Baclofen.  Thanks, BJ

## 2019-01-24 ENCOUNTER — Other Ambulatory Visit: Payer: Self-pay | Admitting: Family Medicine

## 2019-01-24 ENCOUNTER — Telehealth: Payer: Self-pay

## 2019-01-24 MED ORDER — TEMAZEPAM 7.5 MG PO CAPS: 7.5000 mg | ORAL_CAPSULE | Freq: Every evening | ORAL | 0 refills | Status: DC | PRN

## 2019-01-24 NOTE — Telephone Encounter (Signed)
I spoke with Mr Markiewicz while I was seeing his wife. Zanaflex did not help. He would like to try Temazepam. We discussed side effects of temazepam. Temazepam 7.5 mg to take at night as needed for insomnia and CP muscle contractions was sent to his pharmacy. Instructed to arrange a 3 to 4 weeks follow-up.  Betty Swaziland, MD

## 2019-01-24 NOTE — Telephone Encounter (Signed)
Pt states temazepam is $126 and not covered by ins. Please advise on alternate medication. Ph# [phone]  [id number] [place] [place] - [address]

## 2019-01-24 NOTE — Telephone Encounter (Signed)
Copied from CRM [id number]. Topic: General - Other >> Jan 24, 2019 12:39 PM Jaquita Rector A wrote: Reason for CRM: Patient called to say that the tiZANidine (ZANAFLEX) 4 MG tablet is not helping him say he is having problems sleeping. Per patient he would like to know about Temazepam   Stated that he say it on line and wanted to know if he can get that instead. Please advise. Ph# [phone]

## 2019-01-24 NOTE — Telephone Encounter (Signed)
Message sent to Dr. Jordan for review and approval. 

## 2019-01-25 NOTE — Telephone Encounter (Signed)
Message sent to Dr. Jordan for review. 

## 2019-01-25 NOTE — Telephone Encounter (Signed)
Baclofen,Methocarbamol,flexeril are some other muscle relaxant options. OR he can go back to Valium. Thanks, BJ

## 2019-01-25 NOTE — Telephone Encounter (Signed)
Patient stated that he would try Baclofen, but he need something higher than 4 mg.

## 2019-01-26 ENCOUNTER — Other Ambulatory Visit: Payer: Self-pay | Admitting: Family Medicine

## 2019-01-26 MED ORDER — BACLOFEN 20 MG PO TABS: 20.0000 mg | ORAL_TABLET | Freq: Every day | ORAL | 0 refills | Status: DC

## 2019-01-26 NOTE — Telephone Encounter (Signed)
Baclofen 20 mg to take 1-1.5 tab at bedtime sent to his pharmacy. Thanks, BJ

## 2019-01-27 ENCOUNTER — Telehealth: Payer: Self-pay | Admitting: Family Medicine

## 2019-01-27 NOTE — Telephone Encounter (Signed)
Copied from CRM [id number]. Topic: Quick Communication - See Telephone Encounter >> Jan 27, 2019  8:42 AM [name], [name] wrote: CRM for notification. See Telephone encounter for: 01/27/19. Patient call and would like to talk to Dr. Sw[place] or her CMA about a couple of his medication and cost of them and he can get them cheaper if she changed the Mg so the cost would be less for him. Please call patient back, thanks.

## 2019-01-28 NOTE — Telephone Encounter (Signed)
Spoke with patient and he stated that this message was for the previous medication, but the new Rx that was sent in he has picked up and it seems to be working okay.  Nothing further needed at this time.

## 2019-02-19 ENCOUNTER — Other Ambulatory Visit: Payer: Self-pay | Admitting: Family Medicine

## 2019-02-21 DIAGNOSIS — G8929 Other chronic pain: Secondary | ICD-10-CM | POA: Diagnosis not present

## 2019-02-21 DIAGNOSIS — G894 Chronic pain syndrome: Secondary | ICD-10-CM | POA: Diagnosis not present

## 2019-02-21 DIAGNOSIS — Z720 Tobacco use: Secondary | ICD-10-CM | POA: Diagnosis not present

## 2019-02-21 DIAGNOSIS — R1031 Right lower quadrant pain: Secondary | ICD-10-CM | POA: Diagnosis not present

## 2019-02-21 DIAGNOSIS — N528 Other male erectile dysfunction: Secondary | ICD-10-CM | POA: Diagnosis not present

## 2019-02-21 DIAGNOSIS — M545 Low back pain: Secondary | ICD-10-CM | POA: Diagnosis not present

## 2019-03-18 DIAGNOSIS — M4722 Other spondylosis with radiculopathy, cervical region: Secondary | ICD-10-CM | POA: Diagnosis not present

## 2019-03-18 DIAGNOSIS — Z79899 Other long term (current) drug therapy: Secondary | ICD-10-CM | POA: Diagnosis not present

## 2019-03-18 DIAGNOSIS — G894 Chronic pain syndrome: Secondary | ICD-10-CM | POA: Diagnosis not present

## 2019-04-10 ENCOUNTER — Other Ambulatory Visit: Payer: Self-pay | Admitting: Family Medicine

## 2019-04-10 DIAGNOSIS — N401 Enlarged prostate with lower urinary tract symptoms: Secondary | ICD-10-CM

## 2019-04-22 DIAGNOSIS — M4722 Other spondylosis with radiculopathy, cervical region: Secondary | ICD-10-CM | POA: Diagnosis not present

## 2019-04-22 DIAGNOSIS — Z79899 Other long term (current) drug therapy: Secondary | ICD-10-CM | POA: Diagnosis not present

## 2019-05-16 ENCOUNTER — Encounter: Payer: Medicare HMO | Admitting: Family Medicine

## 2019-05-24 ENCOUNTER — Encounter: Payer: Medicare HMO | Admitting: Family Medicine

## 2019-05-31 DIAGNOSIS — Z0000 Encounter for general adult medical examination without abnormal findings: Secondary | ICD-10-CM | POA: Diagnosis not present

## 2019-05-31 DIAGNOSIS — E7800 Pure hypercholesterolemia, unspecified: Secondary | ICD-10-CM | POA: Diagnosis not present

## 2019-05-31 DIAGNOSIS — E559 Vitamin D deficiency, unspecified: Secondary | ICD-10-CM | POA: Diagnosis not present

## 2019-05-31 DIAGNOSIS — M129 Arthropathy, unspecified: Secondary | ICD-10-CM | POA: Diagnosis not present

## 2019-05-31 DIAGNOSIS — M4722 Other spondylosis with radiculopathy, cervical region: Secondary | ICD-10-CM | POA: Diagnosis not present

## 2019-05-31 DIAGNOSIS — Z1331 Encounter for screening for depression: Secondary | ICD-10-CM | POA: Diagnosis not present

## 2019-05-31 DIAGNOSIS — R5383 Other fatigue: Secondary | ICD-10-CM | POA: Diagnosis not present

## 2019-05-31 DIAGNOSIS — Z79899 Other long term (current) drug therapy: Secondary | ICD-10-CM | POA: Diagnosis not present

## 2019-05-31 DIAGNOSIS — Z1159 Encounter for screening for other viral diseases: Secondary | ICD-10-CM | POA: Diagnosis not present

## 2019-05-31 DIAGNOSIS — Z131 Encounter for screening for diabetes mellitus: Secondary | ICD-10-CM | POA: Diagnosis not present

## 2019-05-31 DIAGNOSIS — J209 Acute bronchitis, unspecified: Secondary | ICD-10-CM | POA: Diagnosis not present

## 2019-05-31 DIAGNOSIS — F17210 Nicotine dependence, cigarettes, uncomplicated: Secondary | ICD-10-CM | POA: Diagnosis not present

## 2019-05-31 DIAGNOSIS — Z1339 Encounter for screening examination for other mental health and behavioral disorders: Secondary | ICD-10-CM | POA: Diagnosis not present

## 2019-06-03 DIAGNOSIS — Z122 Encounter for screening for malignant neoplasm of respiratory organs: Secondary | ICD-10-CM | POA: Diagnosis not present

## 2019-06-03 DIAGNOSIS — Z87891 Personal history of nicotine dependence: Secondary | ICD-10-CM | POA: Diagnosis not present

## 2019-07-01 DIAGNOSIS — Z23 Encounter for immunization: Secondary | ICD-10-CM | POA: Diagnosis not present

## 2019-07-01 DIAGNOSIS — M4722 Other spondylosis with radiculopathy, cervical region: Secondary | ICD-10-CM | POA: Diagnosis not present

## 2019-07-01 DIAGNOSIS — G4700 Insomnia, unspecified: Secondary | ICD-10-CM | POA: Diagnosis not present

## 2019-08-29 DIAGNOSIS — Z79899 Other long term (current) drug therapy: Secondary | ICD-10-CM | POA: Diagnosis not present

## 2019-08-29 DIAGNOSIS — M1990 Unspecified osteoarthritis, unspecified site: Secondary | ICD-10-CM | POA: Diagnosis not present

## 2019-08-29 DIAGNOSIS — G894 Chronic pain syndrome: Secondary | ICD-10-CM | POA: Diagnosis not present

## 2019-08-29 DIAGNOSIS — M4722 Other spondylosis with radiculopathy, cervical region: Secondary | ICD-10-CM | POA: Diagnosis not present

## 2019-09-28 DIAGNOSIS — M1990 Unspecified osteoarthritis, unspecified site: Secondary | ICD-10-CM | POA: Diagnosis not present

## 2019-09-28 DIAGNOSIS — Z79899 Other long term (current) drug therapy: Secondary | ICD-10-CM | POA: Diagnosis not present

## 2019-09-28 DIAGNOSIS — G894 Chronic pain syndrome: Secondary | ICD-10-CM | POA: Diagnosis not present

## 2019-10-20 ENCOUNTER — Other Ambulatory Visit: Payer: Self-pay | Admitting: Family Medicine

## 2019-11-10 DIAGNOSIS — G894 Chronic pain syndrome: Secondary | ICD-10-CM | POA: Diagnosis not present

## 2019-11-10 DIAGNOSIS — E559 Vitamin D deficiency, unspecified: Secondary | ICD-10-CM | POA: Diagnosis not present

## 2019-11-10 DIAGNOSIS — M4722 Other spondylosis with radiculopathy, cervical region: Secondary | ICD-10-CM | POA: Diagnosis not present

## 2019-11-10 DIAGNOSIS — Z79899 Other long term (current) drug therapy: Secondary | ICD-10-CM | POA: Diagnosis not present

## 2019-11-21 DIAGNOSIS — Z0289 Encounter for other administrative examinations: Secondary | ICD-10-CM | POA: Diagnosis not present

## 2019-11-29 DIAGNOSIS — H52209 Unspecified astigmatism, unspecified eye: Secondary | ICD-10-CM | POA: Diagnosis not present

## 2019-11-29 DIAGNOSIS — H524 Presbyopia: Secondary | ICD-10-CM | POA: Diagnosis not present

## 2019-11-29 DIAGNOSIS — H5203 Hypermetropia, bilateral: Secondary | ICD-10-CM | POA: Diagnosis not present

## 2019-12-23 ENCOUNTER — Telehealth: Payer: Self-pay | Admitting: *Deleted

## 2019-12-23 NOTE — Telephone Encounter (Signed)
Patient's wife called after hours line. She reports she thinks her husband has COVID. He has been sick with chills, body aches, and vomiting for the last week and it is worsening. Patient reports that he has been able to urinate in the last 8 hours. Wife wants him tested for COVID-19. Patient was triaged and advised to call PCP.

## 2019-12-23 NOTE — Telephone Encounter (Signed)
Can you see if pt's wife and pt can do a virtual visit with Dr. Swaziland at 4:00? I'll add him onto the schedule if they'll be available.

## 2019-12-23 NOTE — Telephone Encounter (Signed)
LVM to return my call for appt

## 2020-01-04 NOTE — Telephone Encounter (Signed)
disregard

## 2020-01-12 DIAGNOSIS — M5412 Radiculopathy, cervical region: Secondary | ICD-10-CM | POA: Diagnosis not present

## 2020-01-12 DIAGNOSIS — G894 Chronic pain syndrome: Secondary | ICD-10-CM | POA: Diagnosis not present

## 2020-01-12 DIAGNOSIS — Z79899 Other long term (current) drug therapy: Secondary | ICD-10-CM | POA: Diagnosis not present

## 2020-02-23 ENCOUNTER — Other Ambulatory Visit: Payer: Self-pay

## 2020-02-24 ENCOUNTER — Ambulatory Visit (INDEPENDENT_AMBULATORY_CARE_PROVIDER_SITE_OTHER): Payer: Medicare HMO | Admitting: Family Medicine

## 2020-02-24 ENCOUNTER — Encounter: Payer: Self-pay | Admitting: Family Medicine

## 2020-02-24 VITALS — BP 120/78 | HR 74 | Temp 97.2°F | Resp 16 | Ht 64.0 in | Wt 116.4 lb

## 2020-02-24 DIAGNOSIS — R0781 Pleurodynia: Secondary | ICD-10-CM

## 2020-02-24 DIAGNOSIS — G809 Cerebral palsy, unspecified: Secondary | ICD-10-CM | POA: Diagnosis not present

## 2020-02-24 MED ORDER — CELECOXIB 100 MG PO CAPS: 100.0000 mg | ORAL_CAPSULE | Freq: Two times a day (BID) | ORAL | 0 refills | Status: AC

## 2020-02-24 NOTE — Progress Notes (Signed)
ACUTE VISIT  Chief Complaint  Patient presents with  . Back Pain    left sided back pain that started on yesterday   HPI: [name] is a 59 y.o. male with hx of lower back pain with sciatic,cervical spondylosis with radiculopathy,and CP who is here today with above complaint.  He has had similar pain intermittent for a while but yesterday suddenly worse. No hx of trauma or unusual physical activity. Burning like pain,not radiated,and constant since yesterday. Exacerbated by movement,alleviated by rest.  Denies fever,chills,abdominal pain,N/V,changes in bowel habits,or urinary symptoms. Negative for saddle anesthesia or bladder/bowel dysfunction. He has not tried OTC treatments.  CP with fixed contractions and muscle atrophy, s/p multiple orthopedic surgeries. On disability since 2021. He is on chronic Percocet use.  Review of Systems  Constitutional: Negative for activity change, appetite change and fatigue.  HENT: Negative for mouth sores, nosebleeds and sore throat.   Genitourinary: Negative for decreased urine volume, dysuria, hematuria and testicular pain.  Musculoskeletal: Positive for arthralgias, back pain and gait problem.  Skin: Negative for pallor and rash.  Neurological: Negative for syncope and headaches.  Psychiatric/Behavioral: Positive for sleep disturbance. Negative for confusion.  Rest see pertinent positives and negatives per HPI.  Current Outpatient Medications on File Prior to Visit  Medication Sig Dispense Refill  . baclofen (LIORESAL) 20 MG tablet TAKE 1-1.5 TABLETS (20-30 MG TOTAL) BY MOUTH AT BEDTIME. 45 tablet 3  . gabapentin (NEURONTIN) 300 MG capsule Take 1 capsule (300 mg total) by mouth 3 (three) times daily. 90 capsule 0  . NARCAN 4 MG/0.1ML LIQD nasal spray kit INSTILL1 (ONE) SPRAY AS NEEDED    . oxyCODONE-acetaminophen (PERCOCET) 7.5-325 MG tablet     . Vitamin D, Ergocalciferol, (DRISDOL) 1.25 MG (50000 UT) CAPS capsule TAKE 1  CAPSULE ONE TIME PER WEEK     No current facility-administered medications on file prior to visit.     Past Medical History:  Diagnosis Date  . Arthritis   . Cerebral palsy ([place])   . Cervical radiculopathy   . H/O umbilical hernia repair 4270  . Scoliosis   . Spondylosis of cervical spine    Allergies  Allergen Reactions  . Naproxen Other (See Comments)    In high stomach  Irritation   . Flexeril [Cyclobenzaprine] Nausea Only    Social History   Socioeconomic History  . Marital status: Married    Spouse name: Not on file  . Number of children: Not on file  . Years of education: Not on file  . Highest education level: Not on file  Occupational History  . Not on file  Tobacco Use  . Smoking status: Current Every Day Smoker    Packs/day: 0.50    Types: Cigarettes  . Smokeless tobacco: Never Used  . Tobacco comment: using patch; cut back to half pack/day.   Substance and Sexual Activity  . Alcohol use: No  . Drug use: No  . Sexual activity: Yes  Other Topics Concern  . Not on file  Social History Narrative  . Not on file   Social Determinants of Health   Financial Resource Strain:   . Difficulty of Paying Living Expenses:   Food Insecurity:   . Worried About Charity fundraiser in the Last Year:   . Arboriculturist in the Last Year:   Transportation Needs:   . Film/video editor (Medical):   Marland Kitchen Lack of Transportation (Non-Medical):   Physical Activity:   .  Days of Exercise per Week:   . Minutes of Exercise per Session:   Stress:   . Feeling of Stress :   Social Connections:   . Frequency of Communication with Friends and Family:   . Frequency of Social Gatherings with Friends and Family:   . Attends Religious Services:   . Active Member of Clubs or Organizations:   . Attends Archivist Meetings:   Marland Kitchen Marital Status:     Vitals:   02/24/20 1520  BP: 120/78  Pulse: 74  Resp: 16  Temp: (!) 97.2 F (36.2 C)  SpO2: 97%   Body mass  index is 19.98 kg/m.  Physical Exam  Nursing note and vitals reviewed. Constitutional: He is oriented to person, place, and time. He appears well-developed. No distress.  HENT:  Head: Normocephalic and atraumatic.  Eyes: Conjunctivae and EOM are normal.  Cardiovascular: Normal rate and regular rhythm.  No murmur heard. Respiratory: Effort normal and breath sounds normal. No respiratory distress.  GI: Soft. He exhibits no mass. There is no hepatomegaly. There is no abdominal tenderness.  Musculoskeletal:        General: No edema.     Thoracic back: Deformity (scoliosis.) and tenderness present. Decreased range of motion.     Comments:  There is tenderness upon palpation of lateral aspect of left rib cage. No tenderness of paraspinal muscles. Pain elicited with movement on exam table during examination. No local edema or erythema appreciated, no suspicious lesions. Leg length discrepancy with limp (baseline). Antalgic gait.   Lymphadenopathy:    He has no cervical adenopathy.  Neurological: He is alert and oriented to person, place, and time. He has normal strength.  SLR does not elicit pain.  Skin: Skin is warm. No erythema.  Psychiatric: He has a normal mood and affect.  Well groomed,good eye contact.   ASSESSMENT AND PLAN:  [name] was seen today for back pain.  Diagnoses and all orders for this visit:  Costal margin pain I do not think imaging is needed today. Examination does not suggest a serious process,suggest musculoskeletal pain. Local icy hot patch may help. After discussion of side effects he would like to try Celebrex. He has had GI side effects with NSAID's.  -     celecoxib (CELEBREX) 100 MG capsule; Take 1 capsule (100 mg total) by mouth 2 (two) times daily for 10 days.  Cerebral palsy, unspecified type ([place]) Independent ADL's and IADL's. With multiple musculoskeletal contractions and chronic pain.   Return if symptoms worsen or fail to  improve.   Philip Bates G. Martinique, MD  [facility]. [place] office.  Discharge Instructions   None    A few things to remember from today's visit:  Let's try Celebrex 100 mg twice daily for 7 to 10 days.  Celebrex is similar to naproxen but is supposed to be more gentle with your stomach. Monitor for new symptoms, including rash. If pain is persistent, still present in 2 to 3 weeks, we can arrange imaging. You can also try over-the-counter IcyHot patch or [place] patch on area.

## 2020-02-24 NOTE — Patient Instructions (Addendum)
A few things to remember from today's visit:   Costal margin pain - Plan: celecoxib (CELEBREX) 100 MG capsule  Let's try Celebrex 100 mg twice daily for 7 to 10 days.  Celebrex is similar to naproxen but is supposed to be more gentle with your stomach. Monitor for new symptoms, including rash. If pain is persistent, still present in 2 to 3 weeks, we can arrange imaging. You can also try over-the-counter IcyHot patch or [place] patch on area.

## 2020-02-26 ENCOUNTER — Encounter: Payer: Self-pay | Admitting: Family Medicine

## 2020-02-29 ENCOUNTER — Other Ambulatory Visit: Payer: Self-pay | Admitting: Family Medicine

## 2020-06-07 ENCOUNTER — Other Ambulatory Visit: Payer: Self-pay

## 2020-06-07 ENCOUNTER — Telehealth (INDEPENDENT_AMBULATORY_CARE_PROVIDER_SITE_OTHER): Payer: Medicare HMO | Admitting: Family Medicine

## 2020-06-07 DIAGNOSIS — R05 Cough: Secondary | ICD-10-CM

## 2020-06-07 DIAGNOSIS — R059 Cough, unspecified: Secondary | ICD-10-CM

## 2020-06-07 DIAGNOSIS — R0981 Nasal congestion: Secondary | ICD-10-CM

## 2020-06-07 MED ORDER — BENZONATATE 100 MG PO CAPS: 100.0000 mg | ORAL_CAPSULE | Freq: Three times a day (TID) | ORAL | 0 refills | Status: DC | PRN

## 2020-06-07 NOTE — Progress Notes (Signed)
Virtual Visit via Video Note  I connected with Philip Bates  on 06/07/20 at 12:20 PM EDT by a video enabled telemedicine application and verified that I am speaking with the correct person using two identifiers.  Location patient: home, [place] Location provider:work or home office Persons participating in the virtual visit: patient, provider  I discussed the limitations of evaluation and management by telemedicine and the availability of in person appointments. The patient expressed understanding and agreed to proceed.   HPI:  Acute visit for cough and congestion: -started 8/29 after coming back from vacation in [place] -symptoms included: felt tired, headache, body aches (has a lot of pain at baseline anyways) but then developed nasal congestion, sore throat, decreased appetite, cough -denies fever, SOB, NVD, loss of taste or smell, inability to tol fluid, inability t -fully vaccinated for covid19 with pfizer, but is worried about having covid -some folks at work had covid recently and they did not know it for several days, he worked closely with them   ROS: See pertinent positives and negatives per HPI.  Past Medical History:  Diagnosis Date  . Arthritis   . Cerebral palsy ([place])   . Cervical radiculopathy   . H/O umbilical hernia repair 6546  . Scoliosis   . Spondylosis of cervical spine     Past Surgical History:  Procedure Laterality Date  . ANTERIOR CERVICAL DECOMPRESSION/DISCECTOMY FUSION 4 LEVELS N/A 08/11/2017   Procedure: Cervical three to Cervical seven Anterior Cervical discectomy with fusion/plate fixation;  Surgeon: Ditty, Kevan Ny, MD;  Location: [facility];  Service: Neurosurgery;  Laterality: N/A;  . BACK SURGERY    . HERNIA REPAIR    . LEG SURGERY    . POSTERIOR CERVICAL FUSION/FORAMINOTOMY N/A 08/12/2018   Procedure: POSTERIOR CERVICAL FUSION WITH LATERAL MASS FIXATION CERVICAL THREE TO CERVICAL SEVEN;  Surgeon: Consuella Lose, MD;  Location: [facility];  Service:  Neurosurgery;  Laterality: N/A;  . SHOULDER ARTHROSCOPY WITH ROTATOR CUFF REPAIR AND SUBACROMIAL DECOMPRESSION Right 02/02/2018   Procedure: RIGHT SHOULDER ARTHROSCOPY WITH SUBACROMIAL DECOMPRESSION, MINI-OPEN ROTATOR CUFF REPAIR;  Surgeon: Meredith Pel, MD;  Location: [facility];  Service: Orthopedics;  Laterality: Right;  . SHOULDER SURGERY Right     Family History  Problem Relation Age of Onset  . Cancer Father        esophagus  . Diabetes Brother   . Diabetes Paternal Uncle     SOCIAL HX: see hpi   Current Outpatient Medications:  .  baclofen (LIORESAL) 20 MG tablet, TAKE 1-1.5 TABLETS (20-30 MG TOTAL) BY MOUTH AT BEDTIME., Disp: 135 tablet, Rfl: 1 .  benzonatate (TESSALON PERLES) 100 MG capsule, Take 1 capsule (100 mg total) by mouth 3 (three) times daily as needed., Disp: 20 capsule, Rfl: 0 .  gabapentin (NEURONTIN) 300 MG capsule, Take 1 capsule (300 mg total) by mouth 3 (three) times daily., Disp: 90 capsule, Rfl: 0 .  NARCAN 4 MG/0.1ML LIQD nasal spray kit, INSTILL1 (ONE) SPRAY AS NEEDED, Disp: , Rfl:  .  oxyCODONE-acetaminophen (PERCOCET) 7.5-325 MG tablet, , Disp: , Rfl:  .  Vitamin D, Ergocalciferol, (DRISDOL) 1.25 MG (50000 UT) CAPS capsule, TAKE 1 CAPSULE ONE TIME PER WEEK, Disp: , Rfl:   EXAM:  VITALS per patient if applicable:  GENERAL: alert, oriented, appears well and in no acute distress  HEENT: atraumatic, conjunttiva clear, no obvious abnormalities on inspection of external nose and ears  NECK: normal movements of the head and neck  LUNGS: on inspection no signs of respiratory  distress, breathing rate appears normal, no obvious gross SOB, gasping or wheezing, occ cough  CV: no obvious cyanosis  MS: moves all visible extremities without noticeable abnormality  PSYCH/NEURO: pleasant and cooperative, no obvious depression or anxiety, speech and thought processing grossly intact  ASSESSMENT AND PLAN:  Discussed the following assessment and  plan:  Cough  Sinus congestion  -we discussed possible serious and likely etiologies, options for evaluation and workup, limitations of telemedicine visit vs in person visit, treatment, treatment risks and precautions. Pt prefers to treat via telemedicine empirically rather then risking or undertaking an in person visit at this moment. Query VURI vs COVID19 breakthrough case vs other. Sent tessalon rx for cough, discussed symptomatic home care. Discussed [phone] testing options, limitation, treatment, pot complications, precautions. Advised to stay home while sick and per cdc if covid test positive. He wants scheduled follow up with PCP office next week - sent message to schedulers to assist and advised pt to call if needed.  Work/School slipped offered: in patient instructions Advised to seek prompt follow up telemedicine visit or in person care if worsening, new symptoms arise, or if is not improving with treatment.   I discussed the assessment and treatment plan with the patient. The patient was provided an opportunity to ask questions and all were answered. The patient agreed with the plan and demonstrated an understanding of the instructions.   The patient was advised to call back or seek an in-person evaluation if the symptoms worsen or if the condition fails to improve as anticipated.   Lucretia Kern, DO

## 2020-06-07 NOTE — Patient Instructions (Addendum)
-  stay home while sick, and if the covid test is positive, for a full 10 days since onset of symptoms PLUS one day of no fever and feeling better   -I sent the medication(s) we discussed to your pharmacy: Meds ordered this encounter  Medications  . benzonatate (TESSALON PERLES) 100 MG capsule    Sig: Take 1 capsule (100 mg total) by mouth 3 (three) times daily as needed.    Dispense:  20 capsule    Refill:  0    -can use tylenol or aleve if needed for fevers, aches and pains per instructions  -can use nasal saline a few times per day if nasal congestion, sometime a short course of Afrin nasal spray for 3 days can help as well  -stay hydrated, drink plenty of fluids and eat small healthy meals - avoid dairy  -can take 1000 IU Vit D3 and Vit C lozenges per instructions  -follow up with your doctor next week as planned unless improving and feeling better  I hope you are feeling better soon! Seek in-person care or a follow up telemedicine visit promptly if your symptoms worsen, new concerns arise or you are not improving as expected. Call 911 if severe symptoms.   WORK SLIP:  Patient Philip Bates,  02-05-1961, was seen for a medical visit today, 06/07/2020. Please excuse from work according to the [facility] guidelines for a COVID like illness. We advise 10 days minimum from the onset of symptoms (06/03/20) PLUS 1 day of no fever and improved symptoms. Will defer to employer for a sooner return to work if COVID19 testing is negative and the symptoms have resolved. Advise following CDC guidelines.  She/He may work remotely from home in self isolation if she is feeling better and wishes to do so.  Sincerely: E-signature: Dr. Kriste Basque, DO [facility] Ph: [phone]

## 2020-06-08 ENCOUNTER — Other Ambulatory Visit: Payer: Medicare HMO

## 2020-06-08 ENCOUNTER — Other Ambulatory Visit: Payer: Self-pay

## 2020-06-08 DIAGNOSIS — Z20822 Contact with and (suspected) exposure to covid-19: Secondary | ICD-10-CM

## 2020-06-09 LAB — NOVEL CORONAVIRUS, NAA: SARS-CoV-2, NAA: NOT DETECTED

## 2020-06-15 ENCOUNTER — Encounter: Payer: Self-pay | Admitting: Family Medicine

## 2020-06-15 ENCOUNTER — Telehealth (INDEPENDENT_AMBULATORY_CARE_PROVIDER_SITE_OTHER): Payer: Medicare HMO | Admitting: Family Medicine

## 2020-06-15 VITALS — Ht 64.0 in

## 2020-06-15 DIAGNOSIS — E162 Hypoglycemia, unspecified: Secondary | ICD-10-CM | POA: Diagnosis not present

## 2020-06-15 DIAGNOSIS — M4722 Other spondylosis with radiculopathy, cervical region: Secondary | ICD-10-CM | POA: Diagnosis not present

## 2020-06-15 DIAGNOSIS — G809 Cerebral palsy, unspecified: Secondary | ICD-10-CM

## 2020-06-15 DIAGNOSIS — R7309 Other abnormal glucose: Secondary | ICD-10-CM

## 2020-06-15 MED ORDER — ORPHENADRINE CITRATE ER 100 MG PO TB12: 100.0000 mg | ORAL_TABLET | Freq: Two times a day (BID) | ORAL | 1 refills | Status: DC | PRN

## 2020-06-15 NOTE — Progress Notes (Signed)
Virtual Visit via Video Note I connected with Philip Bates on 06/15/20 by a video enabled telemedicine application and verified that I am speaking with the correct person using two identifiers.  Location patient: home Location provider:work office Persons participating in the virtual visit: patient, provider. His wife was during present for a few minutes.  I discussed the limitations of evaluation and management by telemedicine and the availability of in person appointments. The patient expressed understanding and agreed to proceed.   HPI: Philip Bates is a 59 yo male with hx of cerebral palsy and chronic pain being seen today to follow on recent virtual visit. He was seen on 06/07/20 when he was c/o cough, fatigue,congection, sore throat, and body aches. COVID 19 vaccination completed. Reporting great improvement. Still having "little" cough,post nasal drainage, and nasal congestion. Negative for CP< dyspnea, or wheezing. Denies fever,chills,fatigue, anosmia,or ageusia. He has "good" appetite.  He did not have COVID 19 screening. No sick contact.  He is concerned because blood work done at [facility] pain management showed "low BS." He was instructed to follow with PCP. He doe snot recall glucose level.  He had blood work on 04/2020 and results given during last visit in 05/2020.  Chronic cervical pain with radiculopathy. He has been on Baclofen for years and it is not longer helping. He has tried Methocarbamol and Zanaflex, neither one help. He cannot take Flexeril, caused nausea.  No hx of recent trauma. + Muscle contractures due to cerebral palsy. He is on Percocet 7.5-325 mg and Gabapentin. + Limitation of ROM. Pain is constant,exacerbated by movement, interferes with sleep. No alleviating factors identified.  ROS: See pertinent positives and negatives per HPI.  Past Medical History:  Diagnosis Date   Arthritis    Cerebral palsy ([place])    Cervical radiculopathy    H/O umbilical  hernia repair 2001   Scoliosis    Spondylosis of cervical spine    Past Surgical History:  Procedure Laterality Date   ANTERIOR CERVICAL DECOMPRESSION/DISCECTOMY FUSION 4 LEVELS N/A 08/11/2017   Procedure: Cervical three to Cervical seven Anterior Cervical discectomy with fusion/plate fixation;  Surgeon: Ditty, Kevan Ny, MD;  Location: [facility];  Service: Neurosurgery;  Laterality: N/A;   BACK SURGERY     HERNIA REPAIR     LEG SURGERY     POSTERIOR CERVICAL FUSION/FORAMINOTOMY N/A 08/12/2018   Procedure: POSTERIOR CERVICAL FUSION WITH LATERAL MASS FIXATION CERVICAL THREE TO CERVICAL SEVEN;  Surgeon: Consuella Lose, MD;  Location: [facility];  Service: Neurosurgery;  Laterality: N/A;   SHOULDER ARTHROSCOPY WITH ROTATOR CUFF REPAIR AND SUBACROMIAL DECOMPRESSION Right 02/02/2018   Procedure: RIGHT SHOULDER ARTHROSCOPY WITH SUBACROMIAL DECOMPRESSION, MINI-OPEN ROTATOR CUFF REPAIR;  Surgeon: Meredith Pel, MD;  Location: [facility];  Service: Orthopedics;  Laterality: Right;   SHOULDER SURGERY Right     Family History  Problem Relation Age of Onset   Cancer Father        esophagus   Diabetes Brother    Diabetes Paternal Uncle     Social History   Socioeconomic History   Marital status: Married    Spouse name: Not on file   Number of children: Not on file   Years of education: Not on file   Highest education level: Not on file  Occupational History   Not on file  Tobacco Use   Smoking status: Current Every Day Smoker    Packs/day: 0.50    Types: Cigarettes   Smokeless tobacco: Never Used   Tobacco comment:  using patch; cut back to half pack/day.   Vaping Use   Vaping Use: Never used  Substance and Sexual Activity   Alcohol use: No   Drug use: No   Sexual activity: Yes  Other Topics Concern   Not on file  Social History Narrative   Not on file   Social Determinants of Health   Financial Resource Strain:    Difficulty of Paying Living  Expenses: Not on file  Food Insecurity:    Worried About [place] in the Last Year: Not on file   Ran Out of Food in the Last Year: Not on file  Transportation Needs:    Lack of Transportation (Medical): Not on file   Lack of Transportation (Non-Medical): Not on file  Physical Activity:    Days of Exercise per Week: Not on file   Minutes of Exercise per Session: Not on file  Stress:    Feeling of Stress : Not on file  Social Connections:    Frequency of Communication with Friends and Family: Not on file   Frequency of Social Gatherings with Friends and Family: Not on file   Attends Religious Services: Not on file   Active Member of Clubs or Organizations: Not on file   Attends Archivist Meetings: Not on file   Marital Status: Not on file  Intimate Partner Violence:    Fear of Current or Ex-Partner: Not on file   Emotionally Abused: Not on file   Physically Abused: Not on file   Sexually Abused: Not on file    Current Outpatient Medications:    benzonatate (TESSALON PERLES) 100 MG capsule, Take 1 capsule (100 mg total) by mouth 3 (three) times daily as needed., Disp: 20 capsule, Rfl: 0   gabapentin (NEURONTIN) 300 MG capsule, Take 1 capsule (300 mg total) by mouth 3 (three) times daily., Disp: 90 capsule, Rfl: 0   NARCAN 4 MG/0.1ML LIQD nasal spray kit, INSTILL1 (ONE) SPRAY AS NEEDED, Disp: , Rfl:    oxyCODONE-acetaminophen (PERCOCET) 7.5-325 MG tablet, , Disp: , Rfl:    Vitamin D, Ergocalciferol, (DRISDOL) 1.25 MG (50000 UT) CAPS capsule, TAKE 1 CAPSULE ONE TIME PER WEEK, Disp: , Rfl:    orphenadrine (NORFLEX) 100 MG tablet, Take 1 tablet (100 mg total) by mouth 2 (two) times daily as needed for muscle spasms., Disp: 60 tablet, Rfl: 1  EXAM:  VITALS per patient if applicable:Ht 5' 4"  (1.626 m)    BMI 19.98 kg/m   GENERAL: alert, oriented, appears well and in no acute distress  HEENT: atraumatic, conjunctiva clear, no obvious  abnormalities on inspection.  NECK: Neck with limited ROM.  LUNGS: on inspection no signs of respiratory distress, breathing rate appears normal, no obvious gross SOB, gasping or wheezing  CV: no obvious cyanosis  PSYCH/NEURO: pleasant and cooperative, no obvious depression or anxiety, speech and thought processing grossly intact  ASSESSMENT AND PLAN:  Discussed the following assessment and plan:  Cervical spondylosis with radiculopathy We discussed other possible options in regard to muscle relaxants. He agrees with trying orphenadrine. We discussed some sided effects. Continue other meds, which are prescribed at [facility].  Low glucose level Asymptomatic. Will arrange labs for HgA1C. His wife has DM II, she agrees with checking fasting BS.  Cerebral palsy, unspecified type ([facility]) Orphenadrine 100 mg bid prn started today.  I discussed the assessment and treatment plan with the patient. Philip Rzepka was provided an opportunity to ask questions and all were  answered. He agreed with the plan and demonstrated an understanding of the instructions.    Return in about 3 months (around 09/14/2020) for I believe he needs a AWV (06/22/20 in teh afternoon is an option). Labs next week..  Asiyah Pineau Martinique, MD

## 2020-06-19 ENCOUNTER — Telehealth: Payer: Self-pay

## 2020-06-19 NOTE — Telephone Encounter (Signed)
I received a fax from [employer]. They do not cover the Orphenadrine 100 mg tablet and want to know if Carisoprodol 350mg  would be an option?

## 2020-06-25 ENCOUNTER — Other Ambulatory Visit: Payer: Self-pay | Admitting: Family Medicine

## 2020-06-28 NOTE — Telephone Encounter (Signed)
I do not prescribe Carisoprodol, he may need to ask pain management. I think he has tried Baclofen,Zanaflex, and Methocarbamol. He cannot take Flexeril. Skelaxin 800 mg tid is another option. Thanks, BJ

## 2020-06-29 MED ORDER — METAXALONE 800 MG PO TABS: 800.0000 mg | ORAL_TABLET | Freq: Three times a day (TID) | ORAL | 1 refills | Status: DC

## 2020-06-29 NOTE — Telephone Encounter (Signed)
Rx sent in

## 2020-06-29 NOTE — Addendum Note (Signed)
Addended by: Kathreen Devoid on: 06/29/2020 07:46 AM   Modules accepted: Orders

## 2020-08-03 ENCOUNTER — Encounter: Payer: Self-pay | Admitting: Orthopedic Surgery

## 2020-08-03 ENCOUNTER — Ambulatory Visit (INDEPENDENT_AMBULATORY_CARE_PROVIDER_SITE_OTHER): Payer: Medicare HMO

## 2020-08-03 ENCOUNTER — Ambulatory Visit (INDEPENDENT_AMBULATORY_CARE_PROVIDER_SITE_OTHER): Payer: Medicare HMO | Admitting: Orthopedic Surgery

## 2020-08-03 ENCOUNTER — Other Ambulatory Visit: Payer: Self-pay

## 2020-08-03 DIAGNOSIS — M79605 Pain in left leg: Secondary | ICD-10-CM

## 2020-08-03 DIAGNOSIS — M79604 Pain in right leg: Secondary | ICD-10-CM

## 2020-08-03 DIAGNOSIS — M25522 Pain in left elbow: Secondary | ICD-10-CM

## 2020-08-03 NOTE — Progress Notes (Signed)
Office Visit Note   Patient: Philip Bates           Date of Birth: 04/21/1961           MRN: [id number] Visit Date: 08/03/2020 Requested by: [place], Betty G, MD [address] [place],  [place] [address] PCP: [place], Betty G, MD  Subjective: Chief Complaint  Patient presents with  . Right Leg - Pain  . Left Leg - Pain  . Left Elbow - Pain    HPI: Philip Bates is a 59 year old patient with bilateral hip pain back pain as well as left arm pain.  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  Works about 30 hours a week or less.  He is in pain management taking oxycodone 3 times a day.  Has a history of back surgery.  He also has a history of shoulder issues for which he is doing reasonably well.  He would like a note to continue his lifting restriction on the right shoulder.  He is concerned about pain localizing to the lateral condyle of his left elbow.  Denies any radicular pain but just reports pain with gripping and symptoms consistent with tennis elbow.              ROS: All systems reviewed are negative as they relate to the chief complaint within the history of present illness.  Patient denies  fevers or chills.   Assessment & Plan: Visit Diagnoses:  1. Pain in left leg   2. Bilateral leg pain   3. Pain in left elbow     Plan: Impression is low back pain with history of fusion.  Radiographs look good and there are no nerve root tension signs today.  Does have leg length discrepancy which may be aggravating his back pain.  Second problem is left hip arthritis.  Patient has leg length discrepancy as well as altered normal femoral anatomy.  I will ask one of my partners to evaluate him for total hip replacement which hopefully can be accompanied by some degree of leg lengthening.  Regarding his elbow we will get him a counterforce brace.  Want to hold off on any type of injection as that shows only short lasting effects.  Follow-up with Dr. Roda Bates.  Follow-Up Instructions: Return in about  1 week (around 08/10/2020) for With Dr Philip Bates to discuss Left THA-ref by Dr Philip Bates.   Orders:  Orders Placed This Encounter  Procedures  . XR HIPS BILAT W OR W/O PELVIS 3-4 VIEWS  . XR Elbow 2 Views Left  . XR Lumbar Spine 2-3 Views   No orders of the defined types were placed in this encounter.     Procedures: No procedures performed   Clinical Data: No additional findings.  Objective: Vital Signs: There were no vitals taken for this visit.  Physical Exam:   Constitutional: Patient appears well-developed HEENT:  Head: Normocephalic Eyes:EOM are normal Neck: Normal range of motion Cardiovascular: Normal rate Pulmonary/chest: Effort normal Neurologic: Patient is alert Skin: Skin is warm Psychiatric: Patient has normal mood and affect    Ortho Exam: Ortho exam demonstrates slightly antalgic gait to the left.  Very diminished internal/external rotation of the left hip compared to the right.  Hip flexion abduction adduction strength intact.  Ankle dorsiflexion intact.  No nerve root tension signs.  No paresthesias L1 S1 bilaterally.  Left elbow is examined.  Has full active and passive range of motion flexion extension pronation supination.  Tenderness over the lateral  epicondyle.  No tenderness over the medial epicondyle with no subluxation of the ulnar nerve.  No other masses lymphadenopathy or skin changes noted in that left elbow region  Specialty Comments:  No specialty comments available.  Imaging: XR Elbow 2 Views Left  Result Date: 08/03/2020 AP lateral left elbow reviewed.  No arthritis.  No loose bodies.  Elbow is located.  Bone quality normal.  Normal left elbow radiographs  XR HIPS BILAT W OR W/O PELVIS 3-4 VIEWS  Result Date: 08/03/2020 AP pelvis bilateral lateral views of the hips reviewed.  Patient has deformity of the left femoral head and neck region along with severe end-stage arthritis in the left hip region.  Right hip well-preserved.  No osteonecrosis.   No acute fracture.  Remainder bony pelvis normal.  XR Lumbar Spine 2-3 Views  Result Date: 08/03/2020 AP lateral lumbar spine reviewed.  Lower lumbar fusion hardware in good position alignment with no adjacent segment disease or spondylolisthesis.  Mild facet arthritis is present.  No hardware complications.    PMFS History: Patient Active Problem List   Diagnosis Date Noted  . BPH associated with nocturia 12/14/2018  . Cervical pseudoarthrosis (HCC) 08/12/2018  . Erectile dysfunction 02/22/2018  . Cervical spondylosis with radiculopathy 08/11/2017  . Chronic right shoulder pain 06/04/2017  . Radiculopathy, cervical 06/01/2017  . Tobacco use disorder 02/10/2017  . Insomnia 02/10/2017  . Cerebral palsy (HCC) 02/10/2017  . Chronic lower back pain 02/10/2017   Past Medical History:  Diagnosis Date  . Arthritis   . Cerebral palsy (HCC)   . Cervical radiculopathy   . H/O umbilical hernia repair 2001  . Scoliosis   . Spondylosis of cervical spine     Family History  Problem Relation Age of Onset  . Cancer Father        esophagus  . Diabetes Brother   . Diabetes Paternal Uncle     Past Surgical History:  Procedure Laterality Date  . ANTERIOR CERVICAL DECOMPRESSION/DISCECTOMY FUSION 4 LEVELS N/A 08/11/2017   Procedure: Cervical three to Cervical seven Anterior Cervical discectomy with fusion/plate fixation;  Surgeon: Ditty, Loura Halt, MD;  Location: [facility] OR;  Service: Neurosurgery;  Laterality: N/A;  . BACK SURGERY    . HERNIA REPAIR    . LEG SURGERY    . POSTERIOR CERVICAL FUSION/FORAMINOTOMY N/A 08/12/2018   Procedure: POSTERIOR CERVICAL FUSION WITH LATERAL MASS FIXATION CERVICAL THREE TO CERVICAL SEVEN;  Surgeon: Lisbeth Renshaw, MD;  Location: [facility] OR;  Service: Neurosurgery;  Laterality: N/A;  . SHOULDER ARTHROSCOPY WITH ROTATOR CUFF REPAIR AND SUBACROMIAL DECOMPRESSION Right 02/02/2018   Procedure: RIGHT SHOULDER ARTHROSCOPY WITH SUBACROMIAL DECOMPRESSION, MINI-OPEN  ROTATOR CUFF REPAIR;  Surgeon: Cammy Copa, MD;  Location: [facility] OR;  Service: Orthopedics;  Laterality: Right;  . SHOULDER SURGERY Right    Social History   Occupational History  . Not on file  Tobacco Use  . Smoking status: Current Every Day Smoker    Packs/day: 0.50    Types: Cigarettes  . Smokeless tobacco: Never Used  . Tobacco comment: using patch; cut back to half pack/day.   Vaping Use  . Vaping Use: Never used  Substance and Sexual Activity  . Alcohol use: No  . Drug use: No  . Sexual activity: Yes

## 2020-08-10 ENCOUNTER — Ambulatory Visit: Payer: Medicare HMO | Admitting: Orthopaedic Surgery

## 2020-08-10 ENCOUNTER — Encounter: Payer: Self-pay | Admitting: Orthopaedic Surgery

## 2020-08-10 ENCOUNTER — Ambulatory Visit (INDEPENDENT_AMBULATORY_CARE_PROVIDER_SITE_OTHER): Payer: Medicare HMO

## 2020-08-10 VITALS — Ht 64.0 in | Wt 116.0 lb

## 2020-08-10 DIAGNOSIS — M1612 Unilateral primary osteoarthritis, left hip: Secondary | ICD-10-CM | POA: Insufficient documentation

## 2020-08-10 NOTE — Progress Notes (Signed)
Office Visit Note   Patient: Philip Bates           Date of Birth: May 08, 1961           MRN: [id number] Visit Date: 08/10/2020              Requested by: [place], Betty G, MD [address] [place],  [place] [address] PCP: [place], Betty G, MD   Assessment & Plan: Visit Diagnoses:  1. Primary osteoarthritis of left hip     Plan: Impression is end-stage left hip DJD.  He has had prior cortisone injection with temporary relief.  Based on our discussion of nonsurgical versus surgical treatment and the associated risks and benefits the patient has elected to proceed with a left total hip replacement in the near future.  He understands that we will do our best to try to correct the leg length discrepancy.  Questions encouraged and answered.  Patient denies a history of DVT.  Follow-Up Instructions: Return if symptoms worsen or fail to improve.   Orders:  Orders Placed This Encounter  Procedures  . XR FEMUR MIN 2 VIEWS LEFT  . Prealbumin   No orders of the defined types were placed in this encounter.     Procedures: No procedures performed   Clinical Data: No additional findings.   Subjective: Chief Complaint  Patient presents with  . Left Hip - Pain    [name] is a very pleasant 59 year old gentleman with history of CP who is a referral from Dr. August Saucer for evaluation of severe left hip DJD.  Patient has had prior spine fusion as well as surgery of his left distal femur sometime ago.  He has severe pain in the left hip and groin and he has a significant and noticeable leg length discrepancy.  He is currently on disability.   Review of Systems  Constitutional: Negative.   All other systems reviewed and are negative.    Objective: Vital Signs: Ht 5\' 4"  (1.626 m)   Wt 116 lb (52.6 kg)   BMI 19.91 kg/m   Physical Exam Vitals and nursing note reviewed.  Constitutional:      Appearance: He is well-developed.  HENT:     Head: Normocephalic and atraumatic.    Eyes:     Pupils: Pupils are equal, round, and reactive to light.  Pulmonary:     Effort: Pulmonary effort is normal.  Abdominal:     Palpations: Abdomen is soft.  Musculoskeletal:        General: Normal range of motion.     Cervical back: Neck supple.  Skin:    General: Skin is warm.  Neurological:     Mental Status: He is alert and oriented to person, place, and time.  Psychiatric:        Behavior: Behavior normal.        Thought Content: Thought content normal.        Judgment: Judgment normal.     Ortho Exam Left hip shows limited external rotation.  Pain with internal and external rotation.  He is about inch to an inch and a half shorter on the left leg.  Mild flexion contracture of the left knee.  Antalgic gait.  Hip is nontender. Specialty Comments:  No specialty comments available.  Imaging: XR FEMUR MIN 2 VIEWS LEFT  Result Date: 08/10/2020 Presence of dynamic condylar plate of the distal femur.  No hardware complications.  No bony abnormalities.    PMFS History: Patient Active Problem  List   Diagnosis Date Noted  . Primary osteoarthritis of left hip 08/10/2020  . BPH associated with nocturia 12/14/2018  . Cervical pseudoarthrosis (HCC) 08/12/2018  . Erectile dysfunction 02/22/2018  . Cervical spondylosis with radiculopathy 08/11/2017  . Chronic right shoulder pain 06/04/2017  . Radiculopathy, cervical 06/01/2017  . Tobacco use disorder 02/10/2017  . Insomnia 02/10/2017  . Cerebral palsy (HCC) 02/10/2017  . Chronic lower back pain 02/10/2017   Past Medical History:  Diagnosis Date  . Arthritis   . Cerebral palsy (HCC)   . Cervical radiculopathy   . H/O umbilical hernia repair 2001  . Scoliosis   . Spondylosis of cervical spine     Family History  Problem Relation Age of Onset  . Cancer Father        esophagus  . Diabetes Brother   . Diabetes Paternal Uncle     Past Surgical History:  Procedure Laterality Date  . ANTERIOR CERVICAL  DECOMPRESSION/DISCECTOMY FUSION 4 LEVELS N/A 08/11/2017   Procedure: Cervical three to Cervical seven Anterior Cervical discectomy with fusion/plate fixation;  Surgeon: Ditty, Loura Halt, MD;  Location: [facility] OR;  Service: Neurosurgery;  Laterality: N/A;  . BACK SURGERY    . HERNIA REPAIR    . LEG SURGERY    . POSTERIOR CERVICAL FUSION/FORAMINOTOMY N/A 08/12/2018   Procedure: POSTERIOR CERVICAL FUSION WITH LATERAL MASS FIXATION CERVICAL THREE TO CERVICAL SEVEN;  Surgeon: Lisbeth Renshaw, MD;  Location: [facility] OR;  Service: Neurosurgery;  Laterality: N/A;  . SHOULDER ARTHROSCOPY WITH ROTATOR CUFF REPAIR AND SUBACROMIAL DECOMPRESSION Right 02/02/2018   Procedure: RIGHT SHOULDER ARTHROSCOPY WITH SUBACROMIAL DECOMPRESSION, MINI-OPEN ROTATOR CUFF REPAIR;  Surgeon: Cammy Copa, MD;  Location: [facility] OR;  Service: Orthopedics;  Laterality: Right;  . SHOULDER SURGERY Right    Social History   Occupational History  . Not on file  Tobacco Use  . Smoking status: Current Every Day Smoker    Packs/day: 0.50    Types: Cigarettes  . Smokeless tobacco: Never Used  . Tobacco comment: using patch; cut back to half pack/day.   Vaping Use  . Vaping Use: Never used  Substance and Sexual Activity  . Alcohol use: No  . Drug use: No  . Sexual activity: Yes

## 2020-08-11 LAB — PREALBUMIN: Prealbumin: 27 mg/dL (ref 21–43)

## 2020-08-13 ENCOUNTER — Emergency Department (HOSPITAL_COMMUNITY): Payer: Medicare HMO

## 2020-08-13 ENCOUNTER — Other Ambulatory Visit (HOSPITAL_COMMUNITY): Payer: Self-pay | Admitting: Surgery

## 2020-08-13 ENCOUNTER — Observation Stay (HOSPITAL_COMMUNITY)
Admission: EM | Admit: 2020-08-13 | Discharge: 2020-08-14 | Disposition: A | Discharge status: Home / Self Care | Payer: Medicare HMO | Attending: General Surgery | Admitting: General Surgery

## 2020-08-13 ENCOUNTER — Encounter (HOSPITAL_COMMUNITY): Payer: Self-pay | Admitting: Emergency Medicine

## 2020-08-13 DIAGNOSIS — T1490XA Injury, unspecified, initial encounter: Secondary | ICD-10-CM

## 2020-08-13 DIAGNOSIS — T148XXA Other injury of unspecified body region, initial encounter: Secondary | ICD-10-CM | POA: Diagnosis present

## 2020-08-13 DIAGNOSIS — G809 Cerebral palsy, unspecified: Secondary | ICD-10-CM | POA: Diagnosis not present

## 2020-08-13 DIAGNOSIS — F17210 Nicotine dependence, cigarettes, uncomplicated: Secondary | ICD-10-CM | POA: Diagnosis not present

## 2020-08-13 DIAGNOSIS — R0602 Shortness of breath: Secondary | ICD-10-CM | POA: Insufficient documentation

## 2020-08-13 DIAGNOSIS — S199XXA Unspecified injury of neck, initial encounter: Secondary | ICD-10-CM | POA: Diagnosis present

## 2020-08-13 DIAGNOSIS — Z23 Encounter for immunization: Secondary | ICD-10-CM | POA: Insufficient documentation

## 2020-08-13 DIAGNOSIS — S1191XA Laceration without foreign body of unspecified part of neck, initial encounter: Principal | ICD-10-CM | POA: Insufficient documentation

## 2020-08-13 DIAGNOSIS — Z20822 Contact with and (suspected) exposure to covid-19: Secondary | ICD-10-CM | POA: Insufficient documentation

## 2020-08-13 MED ORDER — DOCUSATE SODIUM 100 MG PO CAPS
100.0000 mg | ORAL_CAPSULE | Freq: Two times a day (BID) | ORAL | Status: DC
  Administered 2020-08-14: 100 mg via ORAL
  Filled 2020-08-13: qty 1

## 2020-08-13 MED ORDER — LACTATED RINGERS IV SOLN: INTRAVENOUS | Status: DC

## 2020-08-13 MED ORDER — CEFAZOLIN SODIUM-DEXTROSE 2-4 GM/100ML-% IV SOLN
2.0000 g | Freq: Once | INTRAVENOUS | Status: AC
  Administered 2020-08-14: 2 g via INTRAVENOUS

## 2020-08-13 MED ORDER — IOHEXOL 350 MG/ML SOLN
100.0000 mL | Freq: Once | INTRAVENOUS | Status: AC | PRN
  Administered 2020-08-13: 100 mL via INTRAVENOUS

## 2020-08-13 MED ORDER — ENOXAPARIN SODIUM 30 MG/0.3ML ~~LOC~~ SOLN
30.0000 mg | Freq: Two times a day (BID) | SUBCUTANEOUS | Status: DC
  Administered 2020-08-14: 30 mg via SUBCUTANEOUS
  Filled 2020-08-13: qty 0.3

## 2020-08-13 MED ORDER — TETANUS-DIPHTH-ACELL PERTUSSIS 5-2.5-18.5 LF-MCG/0.5 IM SUSY
0.5000 mL | PREFILLED_SYRINGE | Freq: Once | INTRAMUSCULAR | Status: AC
  Administered 2020-08-14: 0.5 mL via INTRAMUSCULAR

## 2020-08-13 MED ORDER — ACETAMINOPHEN 500 MG PO TABS
1000.0000 mg | ORAL_TABLET | Freq: Four times a day (QID) | ORAL | Status: DC
  Administered 2020-08-14 (×3): 1000 mg via ORAL
  Filled 2020-08-13 (×3): qty 2

## 2020-08-13 MED ORDER — METHOCARBAMOL 500 MG PO TABS
1000.0000 mg | ORAL_TABLET | Freq: Three times a day (TID) | ORAL | Status: DC
  Administered 2020-08-14 (×3): 1000 mg via ORAL
  Filled 2020-08-13 (×3): qty 2

## 2020-08-13 MED ORDER — ONDANSETRON 4 MG PO TBDP: 4.0000 mg | ORAL_TABLET | Freq: Four times a day (QID) | ORAL | Status: DC | PRN

## 2020-08-13 MED ORDER — ONDANSETRON HCL 4 MG/2ML IJ SOLN: 4.0000 mg | Freq: Four times a day (QID) | INTRAMUSCULAR | Status: DC | PRN

## 2020-08-13 NOTE — ED Notes (Signed)
Pt to CT
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
## 2020-08-14 ENCOUNTER — Other Ambulatory Visit: Payer: Self-pay

## 2020-08-14 ENCOUNTER — Other Ambulatory Visit (HOSPITAL_COMMUNITY): Payer: Self-pay | Admitting: Physician Assistant

## 2020-08-14 LAB — CBC
HCT: 33.4 % — ABNORMAL LOW (ref 39.0–52.0)
HCT: 38.3 % — ABNORMAL LOW (ref 39.0–52.0)
Hemoglobin: 11.3 g/dL — ABNORMAL LOW (ref 13.0–17.0)
Hemoglobin: 11.7 g/dL — ABNORMAL LOW (ref 13.0–17.0)
MCH: 27.1 pg (ref 26.0–34.0)
MCH: 27.5 pg (ref 26.0–34.0)
MCHC: 30.5 g/dL (ref 30.0–36.0)
MCHC: 33.8 g/dL (ref 30.0–36.0)
MCV: 81.3 fL (ref 80.0–100.0)
MCV: 88.9 fL (ref 80.0–100.0)
Platelets: 235 10*3/uL (ref 150–400)
Platelets: 262 10*3/uL (ref 150–400)
RBC: 4.11 MIL/uL — ABNORMAL LOW (ref 4.22–5.81)
RBC: 4.31 MIL/uL (ref 4.22–5.81)
RDW: 13.4 % (ref 11.5–15.5)
RDW: 13.4 % (ref 11.5–15.5)
WBC: 7.1 10*3/uL (ref 4.0–10.5)
WBC: 9.5 10*3/uL (ref 4.0–10.5)
nRBC: 0 % (ref 0.0–0.2)
nRBC: 0 % (ref 0.0–0.2)

## 2020-08-14 LAB — RESPIRATORY PANEL BY RT PCR (FLU A&B, COVID)
Influenza A by PCR: NEGATIVE
Influenza B by PCR: NEGATIVE
SARS Coronavirus 2 by RT PCR: NEGATIVE

## 2020-08-14 LAB — BASIC METABOLIC PANEL
Anion gap: 15 (ref 5–15)
Anion gap: 8 (ref 5–15)
BUN: 14 mg/dL (ref 6–20)
BUN: 9 mg/dL (ref 6–20)
CO2: 15 mmol/L — ABNORMAL LOW (ref 22–32)
CO2: 26 mmol/L (ref 22–32)
Calcium: 8.4 mg/dL — ABNORMAL LOW (ref 8.9–10.3)
Calcium: 8.7 mg/dL — ABNORMAL LOW (ref 8.9–10.3)
Chloride: 105 mmol/L (ref 98–111)
Chloride: 107 mmol/L (ref 98–111)
Creatinine, Ser: 0.83 mg/dL (ref 0.61–1.24)
Creatinine, Ser: 1.12 mg/dL (ref 0.61–1.24)
GFR, Estimated: >60 mL/min (ref >60)
GFR, Estimated: >60 mL/min (ref >60)
Glucose, Bld: 135 mg/dL — ABNORMAL HIGH (ref 70–99)
Glucose, Bld: 216 mg/dL — ABNORMAL HIGH (ref 70–99)
Potassium: 2.9 mmol/L — ABNORMAL LOW (ref 3.5–5.1)
Potassium: 3.6 mmol/L (ref 3.5–5.1)
Sodium: 135 mmol/L (ref 135–145)
Sodium: 141 mmol/L (ref 135–145)

## 2020-08-14 LAB — HIV ANTIBODY (ROUTINE TESTING W REFLEX): HIV Screen 4th Generation wRfx: NONREACTIVE

## 2020-08-14 MED ORDER — ACETAMINOPHEN 500 MG PO TABS: 1000.0000 mg | ORAL_TABLET | Freq: Three times a day (TID) | ORAL | Status: DC | PRN

## 2020-08-14 MED ORDER — IBUPROFEN 800 MG PO TABS: 800.0000 mg | ORAL_TABLET | Freq: Three times a day (TID) | ORAL | 0 refills | Status: DC | PRN

## 2020-08-14 MED ORDER — BACITRACIN ZINC 500 UNIT/GM EX OINT: TOPICAL_OINTMENT | CUTANEOUS | 0 refills | Status: DC

## 2020-08-14 MED ORDER — OXYCODONE-ACETAMINOPHEN 5-325 MG PO TABS
1.0000 | ORAL_TABLET | Freq: Once | ORAL | Status: AC
  Administered 2020-08-14: 1 via ORAL
  Filled 2020-08-14: qty 1

## 2020-08-14 MED FILL — SM ANTIBIOTIC 500 UNIT/GM O: 500 | 7 days supply | Qty: 28 | Fill #0

## 2020-08-14 MED FILL — IBUPROFEN 800 MG TAB: 800 | 10 days supply | Qty: 30 | Fill #0

## 2020-08-14 NOTE — Discharge Instructions (Signed)
Laceration Care, Adult A laceration is a cut that may go through all layers of the skin. The cut may also go into the tissue that is right under the skin. Some cuts heal on their own. Others need to be closed with stitches (sutures), staples, skin adhesive strips, or skin glue. Taking care of your injury lowers your risk of infection, helps your injury to heal better, and may prevent scarring. Supplies needed:  Soap.  Water.  Hand sanitizer.  Bandage (dressing).  Antibiotic ointment.  Clean towel. How to take care of your cut Wash your hands with soap and water before touching your wound or changing your bandage. If soap and water are not available, use hand sanitizer. If your doctor used stitches or staples:  Keep the wound clean and dry.  If you were given a bandage, change it at least once a day as told by your doctor. You should also change it if it gets wet or dirty.  Keep the wound completely dry for the first 24 hours, or as told by your doctor. After that, you may take a shower or a bath. Do not get the wound soaked in water until after the stitches or staples have been removed.  Clean the wound once a day, or as told by your doctor: ? Wash the wound with soap and water. ? Rinse the wound with water to remove all soap. ? Pat the wound dry with a clean towel. Do not rub the wound.  After you clean the wound, put a thin layer of antibiotic ointment on it as told by your doctor. This ointment: ? Helps to prevent infection. ? Keeps the bandage from sticking to the wound.  Have your stitches or staples removed as told by your doctor. If your doctor used skin adhesive strips:  Keep the wound clean and dry.  If you were given a bandage, you should change it at least once a day as told by your doctor. You should also change it if it gets wet or dirty.  Do not get the skin adhesive strips wet. You can take a shower or a bath, but keep the wound dry.  If the wound gets wet,  pat it dry with a clean towel. Do not rub the wound.  Skin adhesive strips fall off on their own. You can trim the strips as the wound heals. Do not remove any strips that are still stuck to the wound. They will fall off after a while. If your doctor used skin glue:  Try to keep your wound dry, but you may briefly wet it in the shower or bath. Do not soak the wound in water, such as by swimming.  After you take a shower or a bath, gently pat the wound dry with a clean towel. Do not rub the wound.  Do not do any activities that will make you really sweaty until the skin glue has fallen off on its own.  Do not apply liquid, cream, or ointment medicine to your wound while the skin glue is still on.  If you were given a bandage, you should change it at least once a day or as told by your doctor. You should also change it if it gets dirty or wet.  If a bandage is placed over the wound, do not let the tape touch the skin glue.  Do not pick at the glue. The skin glue usually stays on for 5-10 days. Then, it falls off the skin. General   instructions   Take over-the-counter and prescription medicines only as told by your doctor.  If you were given antibiotic medicine or ointment, take or apply it as told by your doctor. Do not stop using it even if your condition improves.  Do not scratch or pick at the wound.  Check your wound every day for signs of infection. Watch for: ? Redness, swelling, or pain. ? Fluid, blood, or pus.  Raise (elevate) the injured area above the level of your heart while you are sitting or lying down.  If directed, put ice on the affected area: ? Put ice in a plastic bag. ? Place a towel between your skin and the bag. ? Leave the ice on for 20 minutes, 2-3 times a day.  Prevent scarring by covering your wound with sunscreen of at least 30 SPF whenever you are outside after your wound has healed.  Keep all follow-up visits as told by your doctor. This is  important. Get help if:  You got a tetanus shot and you have any of these problems at the injection site: ? Swelling. ? Very bad pain. ? Redness. ? Bleeding.  You have a fever.  A wound that was closed breaks open.  You notice a bad smell coming from your wound or your bandage.  You notice something coming out of the wound, such as wood or glass.  Medicine does not relieve your pain.  You have more redness, swelling, or pain at the site of your wound.  You have fluid, blood, or pus coming from your wound.  You notice a change in the color of your skin near your wound.  You need to change the bandage often because fluid, blood, or pus is coming from the wound.  You start to have a new rash.  You start to have numbness around the wound. Get help right away if:  You have very bad swelling around the wound.  Your pain suddenly gets worse and is very bad.  You notice painful lumps near the wound or anywhere on your body.  You have a red streak going away from your wound.  The wound is on your hand or foot, and: ? You cannot move a finger or toe. ? Your fingers or toes look pale or bluish. Summary  A laceration is a cut that may go through all layers of the skin. The cut may also go into the tissue right under the skin.  Some cuts heal on their own. Others need to be closed with stitches, staples, skin adhesive strips, or skin glue.  Follow your doctor's instructions for caring for your cut. Proper care of a cut lowers the risk of infection, helps the cut heal better, and prevents scarring. This information is not intended to replace advice given to you by your health care provider. Make sure you discuss any questions you have with your health care provider. Document Revised: 11/20/2017 Document Reviewed: 10/12/2017 Elsevier Patient Education  2020 [employer]  

## 2020-08-14 NOTE — ED Notes (Signed)
Lunch Tray Ordered @ 1033. 

## 2020-08-14 NOTE — ED Notes (Signed)
Lunch tray ordered 

## 2020-08-14 NOTE — ED Notes (Signed)
Cleansed pt's wounds on right hand, neck, chin, chest and left face and behind left ear. Placed steri strips and gauze over lacerations.
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
## 2020-08-14 NOTE — ED Notes (Signed)
Pt d/c home per MD order. Discharge summary reviewed with pt, pt verbalizes understanding. Pt discharged home with wife. No s/s of acute distress noted. Meds delivered to pt. Pt signed e-signature. Off unit via WC.

## 2020-08-14 NOTE — ED Notes (Signed)
Reached out to transition of care pharmacy r/t pt discharge meds. Reports will be to unit to deliver.
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
## 2020-08-14 NOTE — ED Notes (Signed)
Gave report to [place] to move the pt to [place].    Pt's family member concerned about wounds not being closed.  He also complains of continued pain.  Wants RN to call provider for additional pain medication.
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
## 2020-08-15 ENCOUNTER — Encounter: Payer: Self-pay | Admitting: Orthopaedic Surgery

## 2020-08-17 ENCOUNTER — Encounter: Payer: Self-pay | Admitting: Family Medicine

## 2020-08-17 ENCOUNTER — Other Ambulatory Visit: Payer: Self-pay

## 2020-08-17 ENCOUNTER — Ambulatory Visit (INDEPENDENT_AMBULATORY_CARE_PROVIDER_SITE_OTHER): Payer: Medicare HMO | Admitting: Family Medicine

## 2020-08-17 VITALS — BP 134/86 | HR 71 | Temp 98.1°F | Resp 16 | Ht 64.0 in | Wt 113.6 lb

## 2020-08-17 DIAGNOSIS — M1612 Unilateral primary osteoarthritis, left hip: Secondary | ICD-10-CM

## 2020-08-17 DIAGNOSIS — S1191XD Laceration without foreign body of unspecified part of neck, subsequent encounter: Secondary | ICD-10-CM

## 2020-08-17 DIAGNOSIS — M5412 Radiculopathy, cervical region: Secondary | ICD-10-CM | POA: Diagnosis not present

## 2020-08-17 DIAGNOSIS — F17200 Nicotine dependence, unspecified, uncomplicated: Secondary | ICD-10-CM

## 2020-08-17 DIAGNOSIS — G809 Cerebral palsy, unspecified: Secondary | ICD-10-CM | POA: Diagnosis not present

## 2020-08-17 DIAGNOSIS — R739 Hyperglycemia, unspecified: Secondary | ICD-10-CM | POA: Diagnosis not present

## 2020-08-17 DIAGNOSIS — G4701 Insomnia due to medical condition: Secondary | ICD-10-CM

## 2020-08-17 LAB — POCT GLYCOSYLATED HEMOGLOBIN (HGB A1C): Hemoglobin A1C: 5.4 % (ref 4.0–5.6)

## 2020-08-17 MED ORDER — DIAZEPAM 5 MG PO TABS: 5.0000 mg | ORAL_TABLET | Freq: Every evening | ORAL | 1 refills | Status: DC | PRN

## 2020-08-17 MED ORDER — NICOTINE 14 MG/24HR TD PT24: 14.0000 mg | MEDICATED_PATCH | Freq: Every day | TRANSDERMAL | 0 refills | Status: DC
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
## 2020-08-17 NOTE — Patient Instructions (Signed)
A few things to remember from today's visit:   Hyperglycemia - Plan: POC HgB A1c  Cerebral palsy, unspecified type (HCC) - Plan: diazepam (VALIUM) 5 MG tablet  Radiculopathy, cervical  Tobacco use disorder - Plan: nicotine (NICODERM CQ - DOSED IN MG/24 HOURS) 14 mg/24hr patch  Wounds are healing well. Smoking cessation before surgery.  Valium resumed today. Discuss Lyrica with your pain management provider.  If you need refills please call your pharmacy. Do not use My Chart to request refills or for acute issues that need immediate attention.    Please be sure medication list is accurate. If a new problem present, please set up appointment sooner than planned today.

## 2020-08-20 ENCOUNTER — Other Ambulatory Visit: Payer: Self-pay

## 2020-09-05 NOTE — Progress Notes (Signed)
[id number] [place], [place] - [address] [place] [address] Phone: [phone] Fax: [phone]  [facility] [address] [place], [place] - [address] [place] [address] Phone: [phone] Fax: [phone]  [facility] [place], [place] - [address] [place] [address] Phone: [phone] Fax: [phone]      Your procedure is scheduled on Monday, December 6th.  Report to [facility] Main Entrance "A" at 5:30 A.M., and check in at the Admitting office.  Call this number if you have problems the morning of surgery:  [phone]  Call [phone] if you have any questions prior to your surgery date Monday-Friday 8am-4pm    Remember:  Do not eat after midnight the night before your surgery  You may drink clear liquids until 4:30 AM the morning of your surgery.   Clear liquids allowed are: Water, Non-Citrus Juices (without pulp), Carbonated Beverages, Clear Tea, Black Coffee Only, and Gatorade  Finish your pre-surgery Ensure by 4:30 AM, the morning of surgery.  Drink all in one sitting.  Do not sip.  Nothing else to drink after you finish the Ensure.      Take these medicines the morning of surgery with A SIP OF WATER   Tylenol - if needed  Zyrtec - if needed  Gabapentin (Neurontin)  Narcan - if needed  Oxycodone-Acetaminophen (Percocet)  As of today, STOP taking any Aspirin (unless otherwise instructed by your surgeon) Aleve, Naproxen, Ibuprofen, Motrin, Advil, Goody's, BC's, all herbal medications, fish oil, and all vitamins.                      Do not wear jewelry            Do not wear lotions, powders, colognes, or deodorant.            Men may shave face and neck.            Do not bring valuables to the hospital.            [facility] is not responsible for any belongings or valuables.  Do NOT Smoke  (Tobacco/Vaping) or drink Alcohol 24 hours prior to your procedure If you use a CPAP at night, you may bring all equipment for your overnight stay.   Contacts, glasses, dentures or bridgework may not be worn into surgery.      For patients admitted to the hospital, discharge time will be determined by your treatment team.   Patients discharged the day of surgery will not be allowed to drive home, and someone needs to stay with them for 24 hours.    Special instructions:   [facility]- Preparing For Surgery  Before surgery, you can play an important role. Because skin is not sterile, your skin needs to be as free of germs as possible. You can reduce the number of germs on your skin by washing with CHG (chlorahexidine gluconate) Soap before surgery.  CHG is an antiseptic cleaner which kills germs and bonds with the skin to continue killing germs even after washing.    Oral Hygiene is also important to reduce your risk of infection.  Remember - BRUSH YOUR TEETH THE MORNING OF SURGERY WITH YOUR REGULAR TOOTHPASTE  Please do not use if you have an allergy to CHG or antibacterial soaps. If your skin becomes reddened/irritated stop using the CHG.  Do  not shave (including legs and underarms) for at least 48 hours prior to first CHG shower. It is OK to shave your face.  Please follow these instructions carefully.   1. Shower the NIGHT BEFORE SURGERY and the MORNING OF SURGERY with CHG Soap.   2. If you chose to wash your hair, wash your hair first as usual with your normal shampoo.  3. After you shampoo, rinse your hair and body thoroughly to remove the shampoo.  4. Use CHG as you would any other liquid soap. You can apply CHG directly to the skin and wash gently with a scrungie or a clean washcloth.   5. Apply the CHG Soap to your body ONLY FROM THE NECK DOWN.  Do not use on open wounds or open sores. Avoid contact with your eyes, ears, mouth and genitals (private parts). Wash Face and  genitals (private parts)  with your normal soap.   6. Wash thoroughly, paying special attention to the area where your surgery will be performed.  7. Thoroughly rinse your body with warm water from the neck down.  8. DO NOT shower/wash with your normal soap after using and rinsing off the CHG Soap.  9. Pat yourself dry with a CLEAN TOWEL.  10. Wear CLEAN PAJAMAS to bed the night before surgery  11. Place CLEAN SHEETS on your bed the night of your first shower and DO NOT SLEEP WITH PETS.   Day of Surgery: Wear Clean/Comfortable clothing the morning of surgery Do not apply any deodorants/lotions.   Remember to brush your teeth WITH YOUR REGULAR TOOTHPASTE.   Please read over the following fact sheets that you were given.

## 2020-09-06 ENCOUNTER — Ambulatory Visit (HOSPITAL_COMMUNITY)
Admission: RE | Admit: 2020-09-06 | Discharge: 2020-09-06 | Disposition: A | Discharge status: Home / Self Care | Payer: Medicare HMO | Source: Ambulatory Visit | Attending: Physician Assistant | Admitting: Physician Assistant

## 2020-09-06 ENCOUNTER — Encounter (HOSPITAL_COMMUNITY): Payer: Self-pay

## 2020-09-06 ENCOUNTER — Other Ambulatory Visit: Payer: Self-pay

## 2020-09-06 ENCOUNTER — Encounter (HOSPITAL_COMMUNITY)
Admission: RE | Admit: 2020-09-06 | Discharge: 2020-09-06 | Disposition: A | Discharge status: Home / Self Care | Payer: Medicare HMO | Source: Ambulatory Visit | Attending: Orthopaedic Surgery | Admitting: Orthopaedic Surgery

## 2020-09-06 ENCOUNTER — Other Ambulatory Visit (HOSPITAL_COMMUNITY)
Admission: RE | Admit: 2020-09-06 | Discharge: 2020-09-06 | Disposition: A | Discharge status: Home / Self Care | Payer: Medicare HMO | Source: Ambulatory Visit | Attending: Orthopaedic Surgery | Admitting: Orthopaedic Surgery

## 2020-09-06 DIAGNOSIS — Z79899 Other long term (current) drug therapy: Secondary | ICD-10-CM | POA: Diagnosis not present

## 2020-09-06 DIAGNOSIS — M1612 Unilateral primary osteoarthritis, left hip: Secondary | ICD-10-CM | POA: Insufficient documentation

## 2020-09-06 DIAGNOSIS — R7303 Prediabetes: Secondary | ICD-10-CM | POA: Diagnosis not present

## 2020-09-06 DIAGNOSIS — Z01818 Encounter for other preprocedural examination: Secondary | ICD-10-CM | POA: Insufficient documentation

## 2020-09-06 DIAGNOSIS — G809 Cerebral palsy, unspecified: Secondary | ICD-10-CM | POA: Insufficient documentation

## 2020-09-06 DIAGNOSIS — M419 Scoliosis, unspecified: Secondary | ICD-10-CM | POA: Insufficient documentation

## 2020-09-06 DIAGNOSIS — Z20822 Contact with and (suspected) exposure to covid-19: Secondary | ICD-10-CM | POA: Insufficient documentation

## 2020-09-06 DIAGNOSIS — Z01812 Encounter for preprocedural laboratory examination: Secondary | ICD-10-CM | POA: Insufficient documentation

## 2020-09-06 HISTORY — DX: Prediabetes: R73.03

## 2020-09-06 LAB — URINALYSIS, ROUTINE W REFLEX MICROSCOPIC
Bilirubin Urine: NEGATIVE
Glucose, UA: NEGATIVE mg/dL
Hgb urine dipstick: NEGATIVE
Ketones, ur: NEGATIVE mg/dL
Leukocytes,Ua: NEGATIVE
Nitrite: NEGATIVE
Protein, ur: NEGATIVE mg/dL
Specific Gravity, Urine: 1.016 (ref 1.005–1.030)
pH: 5 (ref 5.0–8.0)

## 2020-09-06 LAB — COMPREHENSIVE METABOLIC PANEL
ALT: 22 U/L (ref 0–44)
AST: 24 U/L (ref 15–41)
Albumin: 3.7 g/dL (ref 3.5–5.0)
Alkaline Phosphatase: 54 U/L (ref 38–126)
Anion gap: 9 (ref 5–15)
BUN: 9 mg/dL (ref 6–20)
CO2: 25 mmol/L (ref 22–32)
Calcium: 8.8 mg/dL — ABNORMAL LOW (ref 8.9–10.3)
Chloride: 101 mmol/L (ref 98–111)
Creatinine, Ser: 0.69 mg/dL (ref 0.61–1.24)
GFR, Estimated: >60 mL/min (ref >60)
Glucose, Bld: 76 mg/dL (ref 70–99)
Potassium: 3.5 mmol/L (ref 3.5–5.1)
Sodium: 135 mmol/L (ref 135–145)
Total Bilirubin: 0.6 mg/dL (ref 0.3–1.2)
Total Protein: 6.4 g/dL — ABNORMAL LOW (ref 6.5–8.1)

## 2020-09-06 LAB — CBC WITH DIFFERENTIAL/PLATELET
Abs Immature Granulocytes: 0.02 10*3/uL (ref 0.00–0.07)
Basophils Absolute: 0.1 10*3/uL (ref 0.0–0.1)
Basophils Relative: 1 %
Eosinophils Absolute: 0.2 10*3/uL (ref 0.0–0.5)
Eosinophils Relative: 3 %
HCT: 35 % — ABNORMAL LOW (ref 39.0–52.0)
Hemoglobin: 11.6 g/dL — ABNORMAL LOW (ref 13.0–17.0)
Immature Granulocytes: 0 %
Lymphocytes Relative: 30 %
Lymphs Abs: 2.1 10*3/uL (ref 0.7–4.0)
MCH: 27.4 pg (ref 26.0–34.0)
MCHC: 33.1 g/dL (ref 30.0–36.0)
MCV: 82.7 fL (ref 80.0–100.0)
Monocytes Absolute: 0.6 10*3/uL (ref 0.1–1.0)
Monocytes Relative: 9 %
Neutro Abs: 4.1 10*3/uL (ref 1.7–7.7)
Neutrophils Relative %: 57 %
Platelets: 303 10*3/uL (ref 150–400)
RBC: 4.23 MIL/uL (ref 4.22–5.81)
RDW: 13.8 % (ref 11.5–15.5)
WBC: 7.1 10*3/uL (ref 4.0–10.5)
nRBC: 0 % (ref 0.0–0.2)

## 2020-09-06 LAB — APTT: aPTT: 30 seconds (ref 24–36)

## 2020-09-06 LAB — TYPE AND SCREEN
ABO/RH(D): B POS
Antibody Screen: NEGATIVE

## 2020-09-06 LAB — HEMOGLOBIN A1C
Hgb A1c MFr Bld: 5.8 % — ABNORMAL HIGH (ref 4.8–5.6)
Mean Plasma Glucose: 119.76 mg/dL

## 2020-09-06 LAB — SURGICAL PCR SCREEN
MRSA, PCR: NEGATIVE
Staphylococcus aureus: POSITIVE — AB

## 2020-09-06 LAB — PROTIME-INR
INR: 1.1 (ref 0.8–1.2)
Prothrombin Time: 13.5 seconds (ref 11.4–15.2)

## 2020-09-06 LAB — SARS CORONAVIRUS 2 (TAT 6-24 HRS): SARS Coronavirus 2: NEGATIVE

## 2020-09-06 LAB — GLUCOSE, CAPILLARY: Glucose-Capillary: 125 mg/dL — ABNORMAL HIGH (ref 70–99)

## 2020-09-06 NOTE — Progress Notes (Signed)
[id number] [place], [place] - [address] [place] [address] Phone: [phone] Fax: [phone]  [facility] [address] [place], [place] - [address] [place] [address] Phone: [phone] Fax: [phone]  [facility] - [place], [place] - [address] [place] [address] Phone: [phone] Fax: [phone]      Your procedure is scheduled on Monday, December 6th.  Report to [facility] Main Entrance "A" at 5:30 A.M., and check in at the Admitting office.  Call this number if you have problems the morning of surgery:  [phone]  Call [phone] if you have any questions prior to your surgery date Monday-Friday 8am-4pm    Remember:  Do not eat after midnight the night before your surgery  You may drink clear liquids until 4:30 AM the morning of your surgery.   Clear liquids allowed are: Water, Non-Citrus Juices (without pulp), Carbonated Beverages, Clear Tea, Black Coffee Only, and Gatorade  Patient Instructions  . The night before surgery:  o No food after midnight. ONLY clear liquids after midnight  . Finish your pre-surgery Ensure by 4:30 AM, the morning of surgery.  Drink all in one sitting.  Do not sip.  Nothing else to drink after you finish the Ensure.           If you have questions, please contact your surgeon's office.     Take these medicines the morning of surgery with A SIP OF WATER   Tylenol - if needed  Zyrtec - if needed  Gabapentin (Neurontin)  Narcan - if needed  Oxycodone-Acetaminophen (Percocet)  As of today, STOP taking any Aspirin (unless otherwise instructed by your surgeon) Aleve, Naproxen, Ibuprofen, Motrin, Advil, Goody's, BC's, all herbal medications, fish oil, and all vitamins.                      Do not wear jewelry            Do not wear lotions, powders, colognes, or deodorant.             Men may shave face and neck.            Do not bring valuables to the hospital.            [facility] is not responsible for any belongings or valuables.  Do NOT Smoke (Tobacco/Vaping) or drink Alcohol 24 hours prior to your procedure If you use a CPAP at night, you may bring all equipment for your overnight stay.   Contacts, glasses, dentures or bridgework may not be worn into surgery.      For patients admitted to the hospital, discharge time will be determined by your treatment team.   Patients discharged the day of surgery will not be allowed to drive home, and someone needs to stay with them for 24 hours.    Special instructions:   [place]- Preparing For Surgery  Before surgery, you can play an important role. Because skin is not sterile, your skin needs to be as free of germs as possible. You can reduce the number of germs on your skin by washing with CHG (chlorahexidine gluconate) Soap before surgery.  CHG is an antiseptic cleaner which kills germs and bonds with the skin to continue killing germs even after washing.    Oral Hygiene is also important to reduce your risk of infection.  Remember - BRUSH YOUR TEETH THE MORNING OF SURGERY WITH YOUR REGULAR TOOTHPASTE  Please do not use if you have an allergy to CHG or antibacterial soaps. If your skin becomes reddened/irritated stop using the CHG.  Do not shave (including legs and underarms) for at least 48 hours prior to first CHG shower. It is OK to shave your face.  Please follow these instructions carefully.   1. Shower the NIGHT BEFORE SURGERY and the MORNING OF SURGERY with CHG Soap.   2. If you chose to wash your hair, wash your hair first as usual with your normal shampoo.  3. After you shampoo, rinse your hair and body thoroughly to remove the shampoo.  4. Use CHG as you would any other liquid soap. You can apply CHG directly to the skin and wash gently with a scrungie or a clean washcloth.   5. Apply the  CHG Soap to your body ONLY FROM THE NECK DOWN.  Do not use on open wounds or open sores. Avoid contact with your eyes, ears, mouth and genitals (private parts). Wash Face and genitals (private parts)  with your normal soap.   6. Wash thoroughly, paying special attention to the area where your surgery will be performed.  7. Thoroughly rinse your body with warm water from the neck down.  8. DO NOT shower/wash with your normal soap after using and rinsing off the CHG Soap.  9. Pat yourself dry with a CLEAN TOWEL.  10. Wear CLEAN PAJAMAS to bed the night before surgery  11. Place CLEAN SHEETS on your bed the night of your first shower and DO NOT SLEEP WITH PETS.   Day of Surgery: Wear Clean/Comfortable clothing the morning of surgery Do not apply any deodorants/lotions.   Remember to brush your teeth WITH YOUR REGULAR TOOTHPASTE.   Please read over the following fact sheets that you were given.

## 2020-09-06 NOTE — Progress Notes (Signed)
PCP - Betty [place] Cardiologist - denies  PPM/ICD - denies  Chest x-ray - 09/06/2020 EKG - 09/06/2020 Stress Test - records requested from [facility] in [place] ECHO -  records requested from [facility] in [place] Cardiac Cath - denies  Sleep Study - denies  Patient instructed to hold all Aspirin, NSAID's, herbal medications, fish oil and vitamins 7 days prior to surgery.   ERAS Protcol -yes PRE-SURGERY Ensure or G2- ensure and instructions given  COVID TEST- 09/06/2020   Anesthesia review: yes,  records requested from [facility] in [place] for cardiac history like stress test and echo.   Patient denies shortness of breath, fever, cough and chest pain at PAT appointment   All instructions explained to the patient, with a verbal understanding of the material. Patient agrees to go over the instructions while at home for a better understanding. Patient also instructed to self quarantine after being tested for COVID-19. The opportunity to ask questions was provided.

## 2020-09-07 ENCOUNTER — Ambulatory Visit: Payer: Medicare HMO | Admitting: Family Medicine

## 2020-09-07 MED ORDER — TRANEXAMIC ACID 1000 MG/10ML IV SOLN
2000.0000 mg | INTRAVENOUS | Status: AC
  Administered 2020-09-10: 2000 mg via TOPICAL
  Filled 2020-09-07: qty 20

## 2020-09-07 MED ORDER — BUPIVACAINE LIPOSOME 1.3 % IJ SUSP
20.0000 mL | INTRAMUSCULAR | Status: AC
  Administered 2020-09-10: 20 mL
  Filled 2020-09-07: qty 20
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
## 2020-09-10 ENCOUNTER — Encounter (HOSPITAL_COMMUNITY): Payer: Self-pay | Admitting: Orthopaedic Surgery

## 2020-09-10 ENCOUNTER — Ambulatory Visit (HOSPITAL_COMMUNITY): Payer: Medicare HMO

## 2020-09-10 ENCOUNTER — Other Ambulatory Visit: Payer: Self-pay

## 2020-09-10 ENCOUNTER — Ambulatory Visit (HOSPITAL_COMMUNITY): Payer: Medicare HMO | Admitting: Vascular Surgery

## 2020-09-10 ENCOUNTER — Encounter (HOSPITAL_COMMUNITY)
Admission: RE | Disposition: A | Discharge status: Home / Self Care | Payer: Self-pay | Source: Home / Self Care | Attending: Orthopaedic Surgery

## 2020-09-10 ENCOUNTER — Observation Stay (HOSPITAL_COMMUNITY)
Admission: RE | Admit: 2020-09-10 | Discharge: 2020-09-12 | Disposition: A | Discharge status: Home / Self Care | Payer: Medicare HMO | Attending: Orthopaedic Surgery | Admitting: Orthopaedic Surgery

## 2020-09-10 ENCOUNTER — Observation Stay (HOSPITAL_COMMUNITY): Payer: Medicare HMO

## 2020-09-10 ENCOUNTER — Other Ambulatory Visit: Payer: Self-pay | Admitting: Physician Assistant

## 2020-09-10 ENCOUNTER — Ambulatory Visit (HOSPITAL_COMMUNITY): Payer: Medicare HMO | Admitting: Anesthesiology

## 2020-09-10 DIAGNOSIS — F17210 Nicotine dependence, cigarettes, uncomplicated: Secondary | ICD-10-CM | POA: Insufficient documentation

## 2020-09-10 DIAGNOSIS — M1612 Unilateral primary osteoarthritis, left hip: Principal | ICD-10-CM | POA: Insufficient documentation

## 2020-09-10 DIAGNOSIS — Z96642 Presence of left artificial hip joint: Secondary | ICD-10-CM

## 2020-09-10 DIAGNOSIS — Z96649 Presence of unspecified artificial hip joint: Secondary | ICD-10-CM

## 2020-09-10 DIAGNOSIS — Z419 Encounter for procedure for purposes other than remedying health state, unspecified: Secondary | ICD-10-CM

## 2020-09-10 HISTORY — PX: TOTAL HIP ARTHROPLASTY: SHX124

## 2020-09-10 SURGERY — ARTHROPLASTY, HIP, TOTAL, ANTERIOR APPROACH
Anesthesia: General | Site: Hip | Laterality: Left

## 2020-09-10 MED ORDER — KETOROLAC TROMETHAMINE 15 MG/ML IJ SOLN
15.0000 mg | Freq: Four times a day (QID) | INTRAMUSCULAR | Status: AC
  Administered 2020-09-10 – 2020-09-11 (×4): 15 mg via INTRAVENOUS
  Filled 2020-09-10 (×5): qty 1

## 2020-09-10 MED ORDER — CHLORHEXIDINE GLUCONATE 0.12 % MT SOLN
15.0000 mL | Freq: Once | OROMUCOSAL | Status: AC
  Administered 2020-09-10: 15 mL via OROMUCOSAL
  Filled 2020-09-10: qty 15

## 2020-09-10 MED ORDER — DEXAMETHASONE SODIUM PHOSPHATE 10 MG/ML IJ SOLN
INTRAMUSCULAR | Status: AC
  Filled 2020-09-10: qty 1

## 2020-09-10 MED ORDER — PHENYLEPHRINE 40 MCG/ML (10ML) SYRINGE FOR IV PUSH (FOR BLOOD PRESSURE SUPPORT)
PREFILLED_SYRINGE | INTRAVENOUS | Status: DC | PRN
  Administered 2020-09-10 (×2): 80 ug via INTRAVENOUS

## 2020-09-10 MED ORDER — OXYCODONE HCL 5 MG PO TABS: 5.0000 mg | ORAL_TABLET | Freq: Once | ORAL | Status: DC | PRN

## 2020-09-10 MED ORDER — OXYCODONE HCL 5 MG PO TABS
5.0000 mg | ORAL_TABLET | ORAL | Status: DC | PRN
  Administered 2020-09-10 – 2020-09-12 (×7): 10 mg via ORAL
  Filled 2020-09-10 (×8): qty 2

## 2020-09-10 MED ORDER — OXYCODONE-ACETAMINOPHEN 5-325 MG PO TABS
1.0000 | ORAL_TABLET | Freq: Four times a day (QID) | ORAL | 0 refills | Status: DC | PRN
Start: 2020-09-10 — End: 2020-09-25

## 2020-09-10 MED ORDER — DEXAMETHASONE SODIUM PHOSPHATE 10 MG/ML IJ SOLN
10.0000 mg | Freq: Once | INTRAMUSCULAR | Status: AC
  Administered 2020-09-11: 10 mg via INTRAVENOUS
  Filled 2020-09-10: qty 1

## 2020-09-10 MED ORDER — POVIDONE-IODINE 10 % EX SWAB: 2.0000 "application " | Freq: Once | CUTANEOUS | Status: DC

## 2020-09-10 MED ORDER — FENTANYL CITRATE (PF) 100 MCG/2ML IJ SOLN: 25.0000 ug | INTRAMUSCULAR | Status: DC | PRN

## 2020-09-10 MED ORDER — ACETAMINOPHEN 160 MG/5ML PO SOLN: 325.0000 mg | ORAL | Status: DC | PRN

## 2020-09-10 MED ORDER — METOCLOPRAMIDE HCL 5 MG/ML IJ SOLN: 5.0000 mg | Freq: Three times a day (TID) | INTRAMUSCULAR | Status: DC | PRN

## 2020-09-10 MED ORDER — METHOCARBAMOL 500 MG PO TABS: 500.0000 mg | ORAL_TABLET | Freq: Two times a day (BID) | ORAL | 0 refills | Status: DC | PRN

## 2020-09-10 MED ORDER — PHENOL 1.4 % MT LIQD: 1.0000 | OROMUCOSAL | Status: DC | PRN

## 2020-09-10 MED ORDER — ONDANSETRON HCL 4 MG/2ML IJ SOLN: 4.0000 mg | Freq: Once | INTRAMUSCULAR | Status: DC | PRN

## 2020-09-10 MED ORDER — BUPIVACAINE HCL (PF) 0.25 % IJ SOLN
INTRAMUSCULAR | Status: AC
  Filled 2020-09-10: qty 30

## 2020-09-10 MED ORDER — MAGNESIUM CITRATE PO SOLN: 1.0000 | Freq: Once | ORAL | Status: DC | PRN

## 2020-09-10 MED ORDER — ACETAMINOPHEN 500 MG PO TABS
1000.0000 mg | ORAL_TABLET | Freq: Four times a day (QID) | ORAL | Status: AC
  Administered 2020-09-10 – 2020-09-11 (×4): 1000 mg via ORAL
  Filled 2020-09-10 (×4): qty 2

## 2020-09-10 MED ORDER — MENTHOL 3 MG MT LOZG: 1.0000 | LOZENGE | OROMUCOSAL | Status: DC | PRN

## 2020-09-10 MED ORDER — SORBITOL 70 % SOLN
30.0000 mL | Freq: Every day | Status: DC | PRN
  Filled 2020-09-10: qty 30

## 2020-09-10 MED ORDER — DIPHENHYDRAMINE HCL 12.5 MG/5ML PO ELIX
25.0000 mg | ORAL_SOLUTION | ORAL | Status: DC | PRN
  Filled 2020-09-10: qty 10

## 2020-09-10 MED ORDER — ALBUMIN HUMAN 25 % IV SOLN: INTRAVENOUS | Status: DC | PRN

## 2020-09-10 MED ORDER — SODIUM CHLORIDE 0.9% FLUSH
INTRAVENOUS | Status: DC | PRN
  Administered 2020-09-10: 20 mL

## 2020-09-10 MED ORDER — DOCUSATE SODIUM 100 MG PO CAPS
100.0000 mg | ORAL_CAPSULE | Freq: Two times a day (BID) | ORAL | Status: DC
  Administered 2020-09-10 – 2020-09-12 (×5): 100 mg via ORAL
  Filled 2020-09-10 (×5): qty 1

## 2020-09-10 MED ORDER — MEPERIDINE HCL 25 MG/ML IJ SOLN: 6.2500 mg | INTRAMUSCULAR | Status: DC | PRN

## 2020-09-10 MED ORDER — STERILE WATER FOR IRRIGATION IR SOLN
Status: DC | PRN
  Administered 2020-09-10: 1000 mL

## 2020-09-10 MED ORDER — ASPIRIN EC 81 MG PO TBEC: 81.0000 mg | DELAYED_RELEASE_TABLET | Freq: Two times a day (BID) | ORAL | 0 refills | Status: AC

## 2020-09-10 MED ORDER — VANCOMYCIN HCL 1000 MG IV SOLR
INTRAVENOUS | Status: AC
  Filled 2020-09-10: qty 1000

## 2020-09-10 MED ORDER — DEXAMETHASONE SODIUM PHOSPHATE 10 MG/ML IJ SOLN
INTRAMUSCULAR | Status: DC | PRN
  Administered 2020-09-10: 10 mg via INTRAVENOUS

## 2020-09-10 MED ORDER — ROCURONIUM BROMIDE 10 MG/ML (PF) SYRINGE
PREFILLED_SYRINGE | INTRAVENOUS | Status: AC
  Filled 2020-09-10: qty 10

## 2020-09-10 MED ORDER — LACTATED RINGERS IV SOLN: INTRAVENOUS | Status: DC

## 2020-09-10 MED ORDER — METHOCARBAMOL 1000 MG/10ML IJ SOLN
500.0000 mg | Freq: Four times a day (QID) | INTRAVENOUS | Status: DC | PRN
  Filled 2020-09-10: qty 5

## 2020-09-10 MED ORDER — ALUM & MAG HYDROXIDE-SIMETH 200-200-20 MG/5ML PO SUSP
30.0000 mL | ORAL | Status: DC | PRN
  Administered 2020-09-11 – 2020-09-12 (×3): 30 mL via ORAL
  Filled 2020-09-10 (×3): qty 30

## 2020-09-10 MED ORDER — ACETAMINOPHEN 325 MG PO TABS: 325.0000 mg | ORAL_TABLET | ORAL | Status: DC | PRN

## 2020-09-10 MED ORDER — FENTANYL CITRATE (PF) 100 MCG/2ML IJ SOLN
INTRAMUSCULAR | Status: AC
  Filled 2020-09-10: qty 2

## 2020-09-10 MED ORDER — ASPIRIN 81 MG PO CHEW
81.0000 mg | CHEWABLE_TABLET | Freq: Two times a day (BID) | ORAL | Status: DC
  Administered 2020-09-10 – 2020-09-12 (×4): 81 mg via ORAL
  Filled 2020-09-10 (×4): qty 1

## 2020-09-10 MED ORDER — PROPOFOL 10 MG/ML IV BOLUS
INTRAVENOUS | Status: DC | PRN
  Administered 2020-09-10: 120 mg via INTRAVENOUS

## 2020-09-10 MED ORDER — 0.9 % SODIUM CHLORIDE (POUR BTL) OPTIME
TOPICAL | Status: DC | PRN
  Administered 2020-09-10: 1000 mL

## 2020-09-10 MED ORDER — SODIUM CHLORIDE 0.9 % IR SOLN
Status: DC | PRN
  Administered 2020-09-10: 500 mL
  Administered 2020-09-10: 3000 mL

## 2020-09-10 MED ORDER — HYDROMORPHONE HCL 1 MG/ML IJ SOLN
0.5000 mg | INTRAMUSCULAR | Status: DC | PRN
  Administered 2020-09-10: 1 mg via INTRAVENOUS
  Filled 2020-09-10: qty 1

## 2020-09-10 MED ORDER — FENTANYL CITRATE (PF) 250 MCG/5ML IJ SOLN
INTRAMUSCULAR | Status: AC
  Filled 2020-09-10: qty 5

## 2020-09-10 MED ORDER — CEFAZOLIN SODIUM-DEXTROSE 2-4 GM/100ML-% IV SOLN
2.0000 g | INTRAVENOUS | Status: AC
  Administered 2020-09-10: 2 g via INTRAVENOUS
  Filled 2020-09-10: qty 100

## 2020-09-10 MED ORDER — DOCUSATE SODIUM 100 MG PO CAPS: 100.0000 mg | ORAL_CAPSULE | Freq: Every day | ORAL | 2 refills | Status: DC | PRN

## 2020-09-10 MED ORDER — SODIUM CHLORIDE 0.9 % IV SOLN: INTRAVENOUS | Status: DC

## 2020-09-10 MED ORDER — TRANEXAMIC ACID-NACL 1000-0.7 MG/100ML-% IV SOLN
INTRAVENOUS | Status: AC
  Filled 2020-09-10: qty 100

## 2020-09-10 MED ORDER — CEFAZOLIN SODIUM-DEXTROSE 2-4 GM/100ML-% IV SOLN
2.0000 g | Freq: Four times a day (QID) | INTRAVENOUS | Status: AC
  Administered 2020-09-10 (×2): 2 g via INTRAVENOUS
  Filled 2020-09-10 (×2): qty 100

## 2020-09-10 MED ORDER — METOCLOPRAMIDE HCL 5 MG PO TABS
5.0000 mg | ORAL_TABLET | Freq: Three times a day (TID) | ORAL | Status: DC | PRN
  Administered 2020-09-11: 10 mg via ORAL
  Filled 2020-09-10: qty 2

## 2020-09-10 MED ORDER — TRANEXAMIC ACID-NACL 1000-0.7 MG/100ML-% IV SOLN
1000.0000 mg | INTRAVENOUS | Status: AC
  Administered 2020-09-10: 1000 mg via INTRAVENOUS
  Filled 2020-09-10: qty 100

## 2020-09-10 MED ORDER — ORAL CARE MOUTH RINSE: 15.0000 mL | Freq: Once | OROMUCOSAL | Status: AC

## 2020-09-10 MED ORDER — EPHEDRINE SULFATE-NACL 50-0.9 MG/10ML-% IV SOSY
PREFILLED_SYRINGE | INTRAVENOUS | Status: DC | PRN
  Administered 2020-09-10 (×2): 10 mg via INTRAVENOUS
  Administered 2020-09-10: 5 mg via INTRAVENOUS

## 2020-09-10 MED ORDER — OXYCODONE HCL 5 MG PO TABS
10.0000 mg | ORAL_TABLET | ORAL | Status: DC | PRN
  Administered 2020-09-11: 10 mg via ORAL

## 2020-09-10 MED ORDER — ONDANSETRON HCL 4 MG PO TABS: 4.0000 mg | ORAL_TABLET | Freq: Four times a day (QID) | ORAL | Status: DC | PRN

## 2020-09-10 MED ORDER — IRRISEPT - 450ML BOTTLE WITH 0.05% CHG IN STERILE WATER, USP 99.95% OPTIME
TOPICAL | Status: DC | PRN
  Administered 2020-09-10: 450 mL via TOPICAL

## 2020-09-10 MED ORDER — LIDOCAINE 2% (20 MG/ML) 5 ML SYRINGE
INTRAMUSCULAR | Status: DC | PRN
  Administered 2020-09-10: 60 mg via INTRAVENOUS

## 2020-09-10 MED ORDER — POLYETHYLENE GLYCOL 3350 17 G PO PACK: 17.0000 g | PACK | Freq: Every day | ORAL | Status: DC | PRN

## 2020-09-10 MED ORDER — OXYCODONE HCL 5 MG/5ML PO SOLN: 5.0000 mg | Freq: Once | ORAL | Status: DC | PRN

## 2020-09-10 MED ORDER — BUPIVACAINE LIPOSOME 1.3 % IJ SUSP
10.0000 mL | Freq: Once | INTRAMUSCULAR | Status: DC
  Filled 2020-09-10: qty 10

## 2020-09-10 MED ORDER — TRANEXAMIC ACID-NACL 1000-0.7 MG/100ML-% IV SOLN
1000.0000 mg | Freq: Once | INTRAVENOUS | Status: AC
  Administered 2020-09-10: 1000 mg via INTRAVENOUS
  Filled 2020-09-10: qty 100

## 2020-09-10 MED ORDER — MIDAZOLAM HCL 5 MG/5ML IJ SOLN
INTRAMUSCULAR | Status: DC | PRN
  Administered 2020-09-10: 2 mg via INTRAVENOUS

## 2020-09-10 MED ORDER — VANCOMYCIN HCL 1 G IV SOLR
INTRAVENOUS | Status: DC | PRN
  Administered 2020-09-10: 1000 mg

## 2020-09-10 MED ORDER — ONDANSETRON HCL 4 MG PO TABS: 4.0000 mg | ORAL_TABLET | Freq: Three times a day (TID) | ORAL | 0 refills | Status: DC | PRN

## 2020-09-10 MED ORDER — KETAMINE HCL 10 MG/ML IJ SOLN
INTRAMUSCULAR | Status: DC | PRN
  Administered 2020-09-10: 20 mg via INTRAVENOUS
  Administered 2020-09-10 (×2): 10 mg via INTRAVENOUS

## 2020-09-10 MED ORDER — OXYCODONE HCL ER 10 MG PO T12A
10.0000 mg | EXTENDED_RELEASE_TABLET | Freq: Two times a day (BID) | ORAL | Status: DC
  Administered 2020-09-10 – 2020-09-12 (×5): 10 mg via ORAL
  Filled 2020-09-10 (×5): qty 1

## 2020-09-10 MED ORDER — FENTANYL CITRATE (PF) 250 MCG/5ML IJ SOLN
INTRAMUSCULAR | Status: DC | PRN
  Administered 2020-09-10 (×10): 25 ug via INTRAVENOUS

## 2020-09-10 MED ORDER — ONDANSETRON HCL 4 MG/2ML IJ SOLN: 4.0000 mg | Freq: Four times a day (QID) | INTRAMUSCULAR | Status: DC | PRN

## 2020-09-10 MED ORDER — FENTANYL CITRATE (PF) 100 MCG/2ML IJ SOLN
25.0000 ug | INTRAMUSCULAR | Status: DC | PRN
  Administered 2020-09-10 (×2): 50 ug via INTRAVENOUS

## 2020-09-10 MED ORDER — PROPOFOL 10 MG/ML IV BOLUS
INTRAVENOUS | Status: AC
  Filled 2020-09-10: qty 20

## 2020-09-10 MED ORDER — ACETAMINOPHEN 325 MG PO TABS
325.0000 mg | ORAL_TABLET | Freq: Four times a day (QID) | ORAL | Status: DC | PRN
  Administered 2020-09-12: 650 mg via ORAL
  Filled 2020-09-10: qty 2

## 2020-09-10 MED ORDER — MIDAZOLAM HCL 2 MG/2ML IJ SOLN
INTRAMUSCULAR | Status: AC
  Filled 2020-09-10: qty 2

## 2020-09-10 MED ORDER — SULFAMETHOXAZOLE-TRIMETHOPRIM 800-160 MG PO TABS: 1.0000 | ORAL_TABLET | Freq: Two times a day (BID) | ORAL | 0 refills | Status: DC

## 2020-09-10 MED ORDER — METHOCARBAMOL 500 MG PO TABS
ORAL_TABLET | ORAL | Status: AC
  Filled 2020-09-10: qty 1

## 2020-09-10 MED ORDER — METHOCARBAMOL 500 MG PO TABS
500.0000 mg | ORAL_TABLET | Freq: Four times a day (QID) | ORAL | Status: DC | PRN
  Administered 2020-09-10 – 2020-09-12 (×4): 500 mg via ORAL
  Filled 2020-09-10 (×5): qty 1

## 2020-09-10 MED ORDER — KETAMINE HCL 50 MG/5ML IJ SOSY
PREFILLED_SYRINGE | INTRAMUSCULAR | Status: AC
  Filled 2020-09-10: qty 5

## 2020-09-10 MED ORDER — GABAPENTIN 300 MG PO CAPS
300.0000 mg | ORAL_CAPSULE | Freq: Three times a day (TID) | ORAL | Status: DC
  Administered 2020-09-10 – 2020-09-12 (×6): 300 mg via ORAL
  Filled 2020-09-10 (×6): qty 1

## 2020-09-10 SURGICAL SUPPLY — 64 items
ACETAB CUP W/GRIPTION 54 (Plate) ×2 IMPLANT
BAG DECANTER FOR FLEXI CONT (MISCELLANEOUS) ×2 IMPLANT
CELLS DAT CNTRL 66122 CELL SVR (MISCELLANEOUS) IMPLANT
COVER PERINEAL POST (MISCELLANEOUS) ×2 IMPLANT
COVER SURGICAL LIGHT HANDLE (MISCELLANEOUS) ×2 IMPLANT
COVER WAND RF STERILE (DRAPES) ×2 IMPLANT
CUP ACETAB W/GRIPTION 54 (Plate) ×1 IMPLANT
DERMABOND ADVANCED (GAUZE/BANDAGES/DRESSINGS) ×1
DERMABOND ADVANCED .7 DNX12 (GAUZE/BANDAGES/DRESSINGS) ×1 IMPLANT
DRAPE C-ARM 42X72 X-RAY (DRAPES) ×2 IMPLANT
DRAPE POUCH INSTRU U-SHP 10X18 (DRAPES) ×2 IMPLANT
DRAPE STERI IOBAN 125X83 (DRAPES) ×2 IMPLANT
DRAPE U-SHAPE 47X51 STRL (DRAPES) ×4 IMPLANT
DRSG AQUACEL AG ADV 3.5X10 (GAUZE/BANDAGES/DRESSINGS) ×2 IMPLANT
DURAPREP 26ML APPLICATOR (WOUND CARE) ×4 IMPLANT
ELECT BLADE 4.0 EZ CLEAN MEGAD (MISCELLANEOUS) ×2
ELECT REM PT RETURN 9FT ADLT (ELECTROSURGICAL) ×2
ELECTRODE BLDE 4.0 EZ CLN MEGD (MISCELLANEOUS) ×1 IMPLANT
ELECTRODE REM PT RTRN 9FT ADLT (ELECTROSURGICAL) ×1 IMPLANT
GLOVE BIOGEL PI IND STRL 7.0 (GLOVE) ×1 IMPLANT
GLOVE BIOGEL PI INDICATOR 7.0 (GLOVE) ×1
GLOVE ECLIPSE 7.0 STRL STRAW (GLOVE) ×4 IMPLANT
GLOVE SKINSENSE NS SZ7.5 (GLOVE) ×1
GLOVE SKINSENSE STRL SZ7.5 (GLOVE) ×1 IMPLANT
GLOVE SURG SYN 7.5  E (GLOVE) ×4
GLOVE SURG SYN 7.5 E (GLOVE) ×4 IMPLANT
GOWN STRL REIN XL XLG (GOWN DISPOSABLE) ×2 IMPLANT
GOWN STRL REUS W/ TWL LRG LVL3 (GOWN DISPOSABLE) IMPLANT
GOWN STRL REUS W/ TWL XL LVL3 (GOWN DISPOSABLE) ×1 IMPLANT
GOWN STRL REUS W/TWL LRG LVL3 (GOWN DISPOSABLE)
GOWN STRL REUS W/TWL XL LVL3 (GOWN DISPOSABLE) ×1
HANDPIECE INTERPULSE COAX TIP (DISPOSABLE) ×1
HEAD CERAMIC DELTA 36 PLUS 1.5 (Hips) ×2 IMPLANT
HOOD PEEL AWAY FLYTE STAYCOOL (MISCELLANEOUS) ×4 IMPLANT
IV NS IRRIG 3000ML ARTHROMATIC (IV SOLUTION) ×2 IMPLANT
JET LAVAGE IRRISEPT WOUND (IRRIGATION / IRRIGATOR) ×2
KIT BASIN OR (CUSTOM PROCEDURE TRAY) ×2 IMPLANT
LAVAGE JET IRRISEPT WOUND (IRRIGATION / IRRIGATOR) ×1 IMPLANT
LINER NEUTRAL 54X36MM PLUS 4 (Hips) ×2 IMPLANT
MARKER SKIN DUAL TIP RULER LAB (MISCELLANEOUS) ×2 IMPLANT
NEEDLE SPNL 18GX3.5 QUINCKE PK (NEEDLE) ×2 IMPLANT
PACK TOTAL JOINT (CUSTOM PROCEDURE TRAY) ×2 IMPLANT
PACK UNIVERSAL I (CUSTOM PROCEDURE TRAY) ×2 IMPLANT
RTRCTR WOUND ALEXIS 18CM MED (MISCELLANEOUS)
SAW OSC TIP CART 19.5X105X1.3 (SAW) ×2 IMPLANT
SCREW 6.5MMX25MM (Screw) ×4 IMPLANT
SEALER BIPOLAR AQUA 6.0 (INSTRUMENTS) ×2 IMPLANT
SET HNDPC FAN SPRY TIP SCT (DISPOSABLE) ×1 IMPLANT
STAPLER VISISTAT 35W (STAPLE) IMPLANT
STEM FEM ACTIS HIGH SZ2 (Stem) ×2 IMPLANT
SUT ETHIBOND 2 V 37 (SUTURE) IMPLANT
SUT ETHILON 2 0 FS 18 (SUTURE) ×6 IMPLANT
SUT VIC AB 0 CT1 27 (SUTURE) ×1
SUT VIC AB 0 CT1 27XBRD ANBCTR (SUTURE) ×1 IMPLANT
SUT VIC AB 1 CTX 36 (SUTURE) ×1
SUT VIC AB 1 CTX36XBRD ANBCTR (SUTURE) ×1 IMPLANT
SUT VIC AB 2-0 CT1 27 (SUTURE) ×2
SUT VIC AB 2-0 CT1 TAPERPNT 27 (SUTURE) ×2 IMPLANT
SYR 50ML LL SCALE MARK (SYRINGE) ×2 IMPLANT
TOWEL GREEN STERILE (TOWEL DISPOSABLE) ×2 IMPLANT
TRAY CATH 16FR W/PLASTIC CATH (SET/KITS/TRAYS/PACK) IMPLANT
TRAY FOLEY W/BAG SLVR 16FR (SET/KITS/TRAYS/PACK) ×1
TRAY FOLEY W/BAG SLVR 16FR ST (SET/KITS/TRAYS/PACK) ×1 IMPLANT
YANKAUER SUCT BULB TIP NO VENT (SUCTIONS) ×2 IMPLANT

## 2020-09-10 NOTE — Anesthesia Postprocedure Evaluation (Signed)
Anesthesia Post Note  Patient: Philip Bates  Procedure(s) Performed: LEFT TOTAL HIP ARTHROPLASTY ANTERIOR APPROACH (Left Hip)     Patient location during evaluation: PACU Anesthesia Type: General Level of consciousness: awake and alert Pain management: pain level controlled Vital Signs Assessment: post-procedure vital signs reviewed and stable Respiratory status: spontaneous breathing, nonlabored ventilation, respiratory function stable and patient connected to nasal cannula oxygen Cardiovascular status: blood pressure returned to baseline and stable Postop Assessment: no apparent nausea or vomiting Anesthetic complications: no   No complications documented.  Last Vitals:  Vitals:   09/10/20 1124 09/10/20 1148  BP: 101/73 98/71  Pulse: 70 70  Resp: 18 17  Temp:  (!) 36.4 C  SpO2: 100% 97%    Last Pain:  Vitals:   09/10/20 1124  TempSrc:   PainSc: Asleep                 ODDONO,ERNEST

## 2020-09-10 NOTE — H&P (Signed)
PREOPERATIVE H&P  Chief Complaint: left hip degenerative joint disease  HPI: Philip Bates is a 59 y.o. male who presents for surgical treatment of left hip degenerative joint disease.  He denies any changes in medical history.  Past Medical History:  Diagnosis Date  . Arthritis   . Cerebral palsy ([place])   . Cervical radiculopathy   . H/O umbilical hernia repair 6734  . Pre-diabetes   . Scoliosis   . Spondylosis of cervical spine    Past Surgical History:  Procedure Laterality Date  . ANTERIOR CERVICAL DECOMPRESSION/DISCECTOMY FUSION 4 LEVELS N/A 08/11/2017   Procedure: Cervical three to Cervical seven Anterior Cervical discectomy with fusion/plate fixation;  Surgeon: Ditty, Kevan Ny, MD;  Location: [facility];  Service: Neurosurgery;  Laterality: N/A;  . BACK SURGERY    . HERNIA REPAIR     inguinal hernia repair in early 2000  . LEG SURGERY    . POSTERIOR CERVICAL FUSION/FORAMINOTOMY N/A 08/12/2018   Procedure: POSTERIOR CERVICAL FUSION WITH LATERAL MASS FIXATION CERVICAL THREE TO CERVICAL SEVEN;  Surgeon: Consuella Lose, MD;  Location: [facility];  Service: Neurosurgery;  Laterality: N/A;  . SHOULDER ARTHROSCOPY WITH ROTATOR CUFF REPAIR AND SUBACROMIAL DECOMPRESSION Right 02/02/2018   Procedure: RIGHT SHOULDER ARTHROSCOPY WITH SUBACROMIAL DECOMPRESSION, MINI-OPEN ROTATOR CUFF REPAIR;  Surgeon: Meredith Pel, MD;  Location: [facility];  Service: Orthopedics;  Laterality: Right;  . SHOULDER SURGERY Right    Social History   Socioeconomic History  . Marital status: Married    Spouse name: Not on file  . Number of children: Not on file  . Years of education: Not on file  . Highest education level: Not on file  Occupational History  . Not on file  Tobacco Use  . Smoking status: Current Every Day Smoker    Packs/day: 1.00    Years: 35.00    Pack years: 35.00    Types: Cigarettes  . Smokeless tobacco: Never Used  Vaping Use  . Vaping Use: Never used  Substance and  Sexual Activity  . Alcohol use: Never  . Drug use: Never  . Sexual activity: Yes  Other Topics Concern  . Not on file  Social History Narrative   ** Merged History Encounter **       Social Determinants of Health   Financial Resource Strain:   . Difficulty of Paying Living Expenses: Not on file  Food Insecurity:   . Worried About Charity fundraiser in the Last Year: Not on file  . Ran Out of Food in the Last Year: Not on file  Transportation Needs:   . Lack of Transportation (Medical): Not on file  . Lack of Transportation (Non-Medical): Not on file  Physical Activity:   . Days of Exercise per Week: Not on file  . Minutes of Exercise per Session: Not on file  Stress:   . Feeling of Stress : Not on file  Social Connections:   . Frequency of Communication with Friends and Family: Not on file  . Frequency of Social Gatherings with Friends and Family: Not on file  . Attends Religious Services: Not on file  . Active Member of Clubs or Organizations: Not on file  . Attends Archivist Meetings: Not on file  . Marital Status: Not on file   Family History  Problem Relation Age of Onset  . Cancer Father        esophagus  . Diabetes Brother   . Diabetes Paternal Uncle  Allergies  Allergen Reactions  . Naproxen Other (See Comments)    Upset stomach  . Flexeril [Cyclobenzaprine] Nausea Only   Prior to Admission medications   Medication Sig Start Date End Date Taking? Authorizing Provider  acetaminophen (TYLENOL) 650 MG CR tablet Take 650 mg by mouth every 8 (eight) hours as needed for pain.   Yes [provider]  bismuth subsalicylate (PEPTO BISMOL) 262 MG/15ML suspension Take 30 mLs by mouth every 6 (six) hours as needed for indigestion.   Yes [provider]  cetirizine (ZYRTEC) 10 MG tablet Take 10 mg by mouth daily as needed for allergies.   Yes [provider]  diclofenac Sodium (VOLTAREN) 1 % GEL Apply 1 application topically 4  (four) times daily as needed (pain).   Yes [provider]  gabapentin (NEURONTIN) 300 MG capsule Take 1 capsule (300 mg total) by mouth 3 (three) times daily. 08/13/18  Yes Newman Pies, MD  Multiple Vitamin (MULTIVITAMIN WITH MINERALS) TABS tablet Take 1 tablet by mouth 3 (three) times a week.   Yes [provider]  NARCAN 4 MG/0.1ML LIQD nasal spray kit Place 1 spray into the nose as needed (opioid overdose).  11/30/18  Yes [provider]  oxyCODONE-acetaminophen (PERCOCET) 7.5-325 MG tablet Take 1 tablet by mouth 3 (three) times daily.  02/13/20  Yes [provider]  acetaminophen (TYLENOL) 500 MG tablet Take 2 tablets (1,000 mg total) by mouth every 8 (eight) hours as needed for mild pain or fever. Patient not taking: Reported on 09/04/2020 08/14/20   Norm Parcel, PA-C  bacitracin ointment Apply to affected area daily Patient not taking: Reported on 09/04/2020 08/14/20 08/14/21  Norm Parcel, PA-C  benzonatate (TESSALON PERLES) 100 MG capsule Take 1 capsule (100 mg total) by mouth 3 (three) times daily as needed. Patient not taking: Reported on 09/04/2020 06/07/20   Lucretia Kern, DO  diazepam (VALIUM) 5 MG tablet Take 1 tablet (5 mg total) by mouth at bedtime as needed for anxiety. Patient not taking: Reported on 09/04/2020 08/17/20   Martinique, Betty G, MD  ibuprofen (ADVIL) 800 MG tablet Take 1 tablet (800 mg total) by mouth every 8 (eight) hours as needed for moderate pain. Patient not taking: Reported on 09/04/2020 08/14/20   Norm Parcel, PA-C  metaxalone (SKELAXIN) 800 MG tablet Take 1 tablet (800 mg total) by mouth 3 (three) times daily. Patient not taking: Reported on 09/04/2020 06/29/20   Martinique, Betty G, MD  nicotine (NICODERM CQ - DOSED IN MG/24 HOURS) 14 mg/24hr patch Place 1 patch (14 mg total) onto the skin daily. Patient not taking: Reported on 09/04/2020 08/17/20   Martin[place], Betty G, MD  orphenadrine (NORFLEX) 100 MG tablet Take 1 tablet  (100 mg total) by mouth 2 (two) times daily as needed for muscle spasms. Patient not taking: Reported on 09/04/2020 06/15/20   Martinique, Betty G, MD     Positive ROS: All other systems have been reviewed and were otherwise negative with the exception of those mentioned in the HPI and as above.  Physical Exam: General: Alert, no acute distress Cardiovascular: No pedal edema Respiratory: No cyanosis, no use of accessory musculature GI: abdomen soft Skin: No lesions in the area of chief complaint Neurologic: Sensation intact distally Psychiatric: Patient is competent for consent with normal mood and affect Lymphatic: no lymphedema  MUSCULOSKELETAL: exam stable  Assessment: left hip degenerative joint disease  Plan: Plan for Procedure(s): LEFT TOTAL HIP ARTHROPLASTY ANTERIOR APPROACH  The  risks benefits and alternatives were discussed with the patient including but not limited to the risks of nonoperative treatment, versus surgical intervention including infection, bleeding, nerve injury,  blood clots, cardiopulmonary complications, morbidity, mortality, among others, and they were willing to proceed.   Preoperative templating of the joint replacement has been completed, documented, and submitted to the Operating Room personnel in order to optimize intra-operative equipment management.   Eduard Roux, MD 09/10/2020 6:01 AM

## 2020-09-10 NOTE — Discharge Instructions (Signed)

## 2020-09-10 NOTE — Evaluation (Signed)
Physical Therapy Evaluation Patient Details Name: Philip Bates MRN: [id number] DOB: May 02, 1961 Today's Date: 09/10/2020   History of Present Illness  Pt is a 59 y/o male s/p L THA, direct anterior. PMH includes cerebral palsy, s/p ACDF, and pre-diabetes.   Clinical Impression  Pt is s/p surgery above with deficits above. Pt requiring min A to stand and transfer to recliner this session. Pt unable to tolerate sitting in recliner, so returned to bed. Anticipate pt will progress well once pain improves. Will continue to follow acutely.     Follow Up Recommendations Follow surgeon's recommendation for DC plan and follow-up therapies;Supervision for mobility/OOB    Equipment Recommendations  None recommended by PT    Recommendations for Other Services   OT eval    Precautions / Restrictions Precautions Precautions: Fall Restrictions Weight Bearing Restrictions: Yes LLE Weight Bearing: Weight bearing as tolerated      Mobility  Bed Mobility Overal bed mobility: Needs Assistance Bed Mobility: Supine to Sit;Sit to Supine     Supine to sit: Mod assist Sit to supine: Mod assist   General bed mobility comments: Mod A for LE assist. Increased time to perform.     Transfers Overall transfer level: Needs assistance Equipment used: Rolling walker (2 wheeled) Transfers: Sit to/from [employer] Sit to Stand: Min assist Stand pivot transfers: Min assist       General transfer comment: Min A for lift assist and steadying to stand and transfer to recliner. Once in recliner, pt with increased pain and requesting to return to bed. Min A for lift assist and steadying.   Ambulation/Gait                Stairs            Wheelchair Mobility    Modified Rankin (Stroke Patients Only)       Balance Overall balance assessment: Needs assistance Sitting-balance support: No upper extremity supported;Feet supported Sitting balance-Leahy Scale: Fair      Standing balance support: Bilateral upper extremity supported Standing balance-Leahy Scale: Poor Standing balance comment: Reliant on BUE support                              Pertinent Vitals/Pain Pain Assessment: 0-10 Pain Score: 10-Worst pain ever Pain Location: L hip  Pain Descriptors / Indicators: Moaning;Aching;Crying;Grimacing;Guarding Pain Intervention(s): Limited activity within patient's tolerance;Monitored during session;Repositioned    Home Living Family/patient expects to be discharged to:: Private residence Living Arrangements: Spouse/significant other Available Help at Discharge: Family Type of Home: House Home Access: Stairs to enter Entrance Stairs-Rails: None Entrance Stairs-Number of Steps: 2 Home Layout: One level Home Equipment: Environmental consultant - 2 wheels;Bedside commode;Cane - single point      Prior Function Level of Independence: Independent with assistive device(s)         Comments: Reports sometimes uses a cane     Hand Dominance        Extremity/Trunk Assessment   Upper Extremity Assessment Upper Extremity Assessment: Defer to OT evaluation    Lower Extremity Assessment Lower Extremity Assessment: Generalized weakness;LLE deficits/detail (CP at baseline ) LLE Deficits / Details: Knee flexion contracture at baseline secondary to CP. Other deficits consistent with post op pain and weakness.     Cervical / Trunk Assessment Cervical / Trunk Assessment: Kyphotic  Communication   Communication: No difficulties  Cognition Arousal/Alertness: Awake/alert Behavior During Therapy: WFL for tasks assessed/performed Overall Cognitive Status: Within Functional Limits  for tasks assessed                                        General Comments      Exercises     Assessment/Plan    PT Assessment Patient needs continued PT services  PT Problem List Decreased range of motion;Decreased strength;Decreased activity  tolerance;Decreased balance;Decreased mobility;Decreased knowledge of use of DME;Decreased knowledge of precautions;Pain       PT Treatment Interventions Gait training;DME instruction;Functional mobility training;Therapeutic activities;Stair training;Therapeutic exercise;Balance training;Patient/family education    PT Goals (Current goals can be found in the Care Plan section)  Acute Rehab PT Goals Patient Stated Goal: to go home PT Goal Formulation: With patient Time For Goal Achievement: 09/24/20 Potential to Achieve Goals: Fair    Frequency 7X/week   Barriers to discharge        Co-evaluation               AM-PAC PT "6 Clicks" Mobility  Outcome Measure Help needed turning from your back to your side while in a flat bed without using bedrails?: A Lot Help needed moving from lying on your back to sitting on the side of a flat bed without using bedrails?: A Lot Help needed moving to and from a bed to a chair (including a wheelchair)?: A Little Help needed standing up from a chair using your arms (e.g., wheelchair or bedside chair)?: A Little Help needed to walk in hospital room?: A Lot Help needed climbing 3-5 steps with a railing? : A Lot 6 Click Score: 14    End of Session Equipment Utilized During Treatment: Gait belt Activity Tolerance: Patient limited by pain Patient left: in bed;with call bell/phone within reach Nurse Communication: Mobility status PT Visit Diagnosis: Unsteadiness on feet (R26.81);Pain;Muscle weakness (generalized) (M62.81) Pain - Right/Left: Left Pain - part of body: Hip    Time: 1749-4496 PT Time Calculation (min) (ACUTE ONLY): 19 min   Charges:   PT Evaluation $PT Eval Low Complexity: 1 Low          Cindee Salt, DPT  Acute Rehabilitation Services  Pager: [phone] Office: [phone]   [name] 09/10/2020, 2:45 PM

## 2020-09-10 NOTE — Progress Notes (Signed)
Orthopedic Tech Progress Note Patient Details:  Philip Bates 06/02/1961 [id number] Nurse station called requesting a Knee immobilizer.  Ortho Devices Type of Ortho Device: Knee Immobilizer Ortho Device/Splint Interventions: Application, Ordered   Post Interventions Patient Tolerated: Well Instructions Provided: Adjustment of device, Care of device  Patient ID: Philip Bates, male   DOB: [birth year], 59 y.o.   MRN: [id number]   [name] 09/10/2020, 3:20 PM

## 2020-09-10 NOTE — Transfer of Care (Signed)
Immediate Anesthesia Transfer of Care Note  Patient: Philip Bates  Procedure(s) Performed: LEFT TOTAL HIP ARTHROPLASTY ANTERIOR APPROACH (Left Hip)  Patient Location: PACU  Anesthesia Type:General  Level of Consciousness: awake, alert , oriented and patient cooperative  Airway & Oxygen Therapy: Patient Spontanous Breathing and Patient connected to face mask oxygen  Post-op Assessment: Report given to RN, Post -op Vital signs reviewed and stable and Patient moving all extremities  Post vital signs: Reviewed and stable  Last Vitals:  Vitals Value Taken Time  BP    Temp    Pulse 104 09/10/20 1022  Resp 19 09/10/20 1022  SpO2 100 % 09/10/20 1022  Vitals shown include unvalidated device data.  Last Pain:  Vitals:   09/10/20 0637  TempSrc: Oral  PainSc:          Complications: No complications documented.
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
## 2020-09-10 NOTE — Anesthesia Procedure Notes (Signed)
Procedure Name: LMA Insertion Date/Time: 09/10/2020 7:23 AM Performed by: Lucinda Dell, CRNA Pre-anesthesia Checklist: Patient identified, Emergency Drugs available, Suction available and Patient being monitored Patient Re-evaluated:Patient Re-evaluated prior to induction Oxygen Delivery Method: Circle system utilized Preoxygenation: Pre-oxygenation with 100% oxygen Induction Type: IV induction Ventilation: Mask ventilation without difficulty LMA: LMA inserted LMA Size: 4.0 Number of attempts: 1 Placement Confirmation: positive ETCO2 and breath sounds checked- equal and bilateral Tube secured with: Tape Dental Injury: Teeth and Oropharynx as per pre-operative assessment

## 2020-09-10 NOTE — Op Note (Addendum)
LEFT TOTAL HIP ARTHROPLASTY ANTERIOR APPROACH  Procedure Note Kurtis Anastasia   [id number]  Pre-op Diagnosis: left hip degenerative joint disease, spastic CP     Post-op Diagnosis: same   Operative Procedures  1. Total hip replacement; Left hip; uncemented [id number]   Personnel  Surgeon(s): Tarry Kos, MD  Assist: Oneal Grout, PA-C; necessary for the timely completion of procedure and due to complexity of procedure.   Anesthesia: general, exparel local  Prosthesis: Depuy Acetabulum: Pinnacle 54 mm Femur: Actis 2 HO Head: 36 mm size: +1.5 Liner: +4 Bearing Type: ceramic on poly  Total Hip Arthroplasty (Anterior Approach) Op Note:  After informed consent was obtained and the operative extremity marked in the holding area, the patient was brought back to the operating room and placed supine on the HANA table. Next, the operative extremity was prepped and draped in normal sterile fashion. Surgical timeout occurred verifying patient identification, surgical site, surgical procedure and administration of antibiotics.  A modified anterior Smith-Peterson approach to the hip was performed, using the interval between tensor fascia lata and sartorius.  The anatomy was somewhat distorted given the history of the patient's cerebral palsy.  Careful dissection was carried bluntly down onto the anterior hip capsule. The lateral femoral circumflex vessels were identified and coagulated. A capsulotomy was performed.  We then used fluoroscopy to confirm that we were in the appropriate tissue planes and the neck osteotomy was performed using fluoroscopic guidance.  The hip was severely contracted.  In order to gain the appropriate visualization I first elevated the TFL off of the iliac crest.  This failed to give me enough length and exposure so therefore I then externally rotated the leg and release the iliopsoas tendon off of the lesser trochanter.  This still did not give me enough exposure  therefore I used an oscillating saw to osteotomize the neck cut a little bit more inferior than the prior level.  Unfortunately this was still not enough therefore I found that the abductors were severely contracted and therefore had to release the abductors off of the greater trochanter.  This finally gave me enough exposure and visualization to continue with the surgery.  The femoral head was removed in a piecemeal fashion, the acetabular rim was cleared of soft tissue and calcified labrum and osteophytes and attention was turned to reaming the acetabulum.  Sequential reaming was performed under fluoroscopic guidance. We reamed to a size 53 mm, and then impacted the acetabular shell. Two 25 mm cancellous screw were placed through the shell for added fixation.  The +4 liner was then placed after irrigation and attention turned to the femur.  After placing the femoral hook, the leg was taken to externally rotated, extended and adducted position taking care to perform soft tissue releases to allow for adequate mobilization of the femur. Soft tissue was cleared from the shoulder of the greater trochanter and the hook elevator used to improve exposure of the proximal femur. Sequential broaching performed up to a size 2. Trial neck and head were placed. The leg was brought back up to neutral and the construct reduced.  Antibiotic irrigation was placed in the surgical wound and kept for at least 1 minute.  The position and sizing of components, offset and leg lengths were checked using fluoroscopy. Stability of the construct was checked in extension and external rotation to 120 degrees without any subluxation or impingement of prosthesis.  Shuck test was normal.  The hip was then flexed to about  20 degrees and internal rotation to 90 degrees was also stable.  We dislocated the prosthesis which was very difficult, dropped the leg back into position, removed trial components, and irrigated copiously. The final stem and  head was then placed, the leg brought back up, the system reduced and fluoroscopy used to verify positioning.  We irrigated, obtained hemostasis.  One gram of vancomycin powder was placed in the surgical bed. A dilute solution of 20 cc of normal saline, 1.3% exparel, 0.25% bupivacaine was injected in the soft tissues.  One gram of topical tranexamic acid was injected into the joint.  The fascia was closed with #1 vicryl plus, the deep fat layer was closed with 0 vicryl, the subcutaneous layers closed with 2.0 Vicryl Plus and the skin closed with 2-0 nylon and Dermabond. A sterile dressing was applied. The patient was awakened in the operating room and taken to recovery in stable condition.  All sponge, needle, and instrument counts were correct at the end of the case.   Position: supine  Complications: see description of procedure.  Time Out: performed   Drains/Packing: none  Estimated blood loss: see anesthesia record  Returned to Recovery Room: in good condition.   Antibiotics: yes   Mechanical VTE (DVT) Prophylaxis: sequential compression devices, TED thigh-high  Chemical VTE (DVT) Prophylaxis: Aspirin  Fluid Replacement: see anesthesia record  Specimens Removed: 1 to pathology   Sponge and Instrument Count Correct? yes   PACU: portable radiograph - low AP   Plan/RTC: Return in 2 weeks for staple removal. Weight Bearing/Load Lower Extremity: full  Hip precautions: none Suture Removal: 2 weeks   N. Glee Arvin, MD [facility] 9:43 AM   Implant Name Type Inv. Item Serial No. Manufacturer Lot No. LRB No. Used Action  LINER NEUTRAL [id number] PLUS 4 - [id number] Hips LINER NEUTRAL 54X36MM PLUS 4  [employer] [id number] Left 1 Implanted  SCREW 6.5MMX25MM - [id number] Screw SCREW 6.5MMX25MM  [employer] [id number] Left 1 Implanted  SCREW 6.5MMX25MM - [id number] Screw SCREW 6.5MMX25MM  [employer] [id number] Left 1 Implanted  ACETAB CUP W GRIPTION -  [id number] Plate ACETAB CUP W GRIPTION  [employer] [id number] Left 1 Implanted  STEM FEM ACTIS HIGH SZ2 - [id number] Stem STEM FEM ACTIS HIGH SZ2  [employer] [id number] Left 1 Implanted  HEAD CERAMIC DELTA 36 PLUS 1.5 - [id number] Hips HEAD CERAMIC DELTA 36 PLUS 1.5  [employer] [id number] Left 1 Implanted

## 2020-09-11 ENCOUNTER — Encounter (HOSPITAL_COMMUNITY): Payer: Self-pay | Admitting: Orthopaedic Surgery

## 2020-09-11 DIAGNOSIS — M1612 Unilateral primary osteoarthritis, left hip: Secondary | ICD-10-CM | POA: Diagnosis not present

## 2020-09-11 LAB — BASIC METABOLIC PANEL
Anion gap: 12 (ref 5–15)
BUN: 17 mg/dL (ref 6–20)
CO2: 23 mmol/L (ref 22–32)
Calcium: 9 mg/dL (ref 8.9–10.3)
Chloride: 105 mmol/L (ref 98–111)
Creatinine, Ser: 0.94 mg/dL (ref 0.61–1.24)
GFR, Estimated: >60 mL/min (ref >60)
Glucose, Bld: 153 mg/dL — ABNORMAL HIGH (ref 70–99)
Potassium: 3.9 mmol/L (ref 3.5–5.1)
Sodium: 140 mmol/L (ref 135–145)

## 2020-09-11 LAB — CBC
HCT: 23.5 % — ABNORMAL LOW (ref 39.0–52.0)
Hemoglobin: 7.8 g/dL — ABNORMAL LOW (ref 13.0–17.0)
MCH: 27.3 pg (ref 26.0–34.0)
MCHC: 33.2 g/dL (ref 30.0–36.0)
MCV: 82.2 fL (ref 80.0–100.0)
Platelets: 209 10*3/uL (ref 150–400)
RBC: 2.86 MIL/uL — ABNORMAL LOW (ref 4.22–5.81)
RDW: 14.2 % (ref 11.5–15.5)
WBC: 10.8 10*3/uL — ABNORMAL HIGH (ref 4.0–10.5)
nRBC: 0 % (ref 0.0–0.2)

## 2020-09-11 NOTE — Progress Notes (Signed)
Orthopedic Tech Progress Note Patient Details:  Philip Bates Nov 27, 1960 [id number] PA had RN called requesting a LONGER KNEE IMMOBILIZER. So I applied a 20 in and trim off extra material with THERAPY at bedside.   Ortho Devices Type of Ortho Device: Knee Immobilizer Ortho Device/Splint Location: LLE Ortho Device/Splint Interventions: Application, Adjustment   Post Interventions Patient Tolerated: Well Instructions Provided: Care of device   [name] 09/11/2020, 8:11 AM

## 2020-09-11 NOTE — Progress Notes (Signed)
Physical Therapy Treatment Patient Details Name: Philip Bates MRN: [id number] DOB: 05/08/1961 Today's Date: 09/11/2020    History of Present Illness Pt is a 59 y/o male s/p L THA, direct anterior. PMH includes cerebral palsy, s/p ACDF, and pre-diabetes.     PT Comments    Patient progressing well towards PT goals. Reports pain is slightly improved today. Continues to require Mod A for bed mobility as pt has difficulty managing LLE in KI as well as with trunk. Improved ambulation distance with min guard assist and cues for a more normalized gait pattern. Limited by pain/fatigue/stiffness. Instructed pt in there ex. Will plan for stair training this afternoon prior to d/c. Will follow.    Follow Up Recommendations  Follow surgeon's recommendation for DC plan and follow-up therapies;Supervision for mobility/OOB     Equipment Recommendations  None recommended by PT    Recommendations for Other Services       Precautions / Restrictions Precautions Precautions: Fall Required Braces or Orthoses: Knee Immobilizer - Left Restrictions Weight Bearing Restrictions: Yes LLE Weight Bearing: Weight bearing as tolerated    Mobility  Bed Mobility Overal bed mobility: Needs Assistance Bed Mobility: Sit to Supine;Supine to Sit     Supine to sit: Mod assist;HOB elevated Sit to supine: Mod assist   General bed mobility comments: Assist with trunk and managing LLE to get to EOB with increased time. Cues to use RLE to hook under LLE to bring into bed with some assist.  Transfers Overall transfer level: Needs assistance Equipment used: Rolling walker (2 wheeled) Transfers: Sit to/from Stand Sit to Stand: Min guard         General transfer comment: Min guard for safety. Difficulty sitting with KI donned; pt came up from EOB using UEs with decreased flexion throughout.  Ambulation/Gait Ambulation/Gait assistance: Min guard Gait Distance (Feet): 80 Feet Assistive device: Rolling walker (2  wheeled) Gait Pattern/deviations: Step-to pattern;Step-through pattern;Decreased stance time - left;Decreased step length - right;Narrow base of support;Decreased dorsiflexion - left Gait velocity: decreased   General Gait Details: Slow, mildly unsteady gait wtih narrow BoS and decreased stance time LLE. Cues for step through gait. Initially difficulty advancing LLE but this improved with increased distance.   Stairs             Wheelchair Mobility    Modified Rankin (Stroke Patients Only)       Balance Overall balance assessment: Needs assistance Sitting-balance support: No upper extremity supported;Feet supported Sitting balance-Leahy Scale: Fair Sitting balance - Comments: Requires UE support sitting EOB, difficulty sitting due to KI donned and limiting flexion as well as pain   Standing balance support: During functional activity Standing balance-Leahy Scale: Poor Standing balance comment: Reliant on BUE support                             Cognition Arousal/Alertness: Awake/alert Behavior During Therapy: WFL for tasks assessed/performed Overall Cognitive Status: Within Functional Limits for tasks assessed                                        Exercises Total Joint Exercises Ankle Circles/Pumps: PROM;Left;10 reps;Supine Quad Sets: Both;AROM;10 reps;Supine Heel Slides: AAROM;Left;5 reps;Supine Hip ABduction/ADduction: AAROM;Left;5 reps;Supine;AROM    General Comments General comments (skin integrity, edema, etc.): Bandage- clean dry and intact. No weeping. Pt with no DF LLE, LE positioned into  adduction/IR, limited ability to activate quad.      Pertinent Vitals/Pain Pain Assessment: Faces Faces Pain Scale: Hurts even more Pain Location: left hip with movement Pain Descriptors / Indicators: Sore;Tightness;Operative site guarding Pain Intervention(s): Monitored during session;Repositioned    Home Living                       Prior Function            PT Goals (current goals can now be found in the care plan section) Progress towards PT goals: Progressing toward goals    Frequency    7X/week      PT Plan Current plan remains appropriate    Co-evaluation              AM-PAC PT "6 Clicks" Mobility   Outcome Measure  Help needed turning from your back to your side while in a flat bed without using bedrails?: A Lot Help needed moving from lying on your back to sitting on the side of a flat bed without using bedrails?: A Lot Help needed moving to and from a bed to a chair (including a wheelchair)?: A Little Help needed standing up from a chair using your arms (e.g., wheelchair or bedside chair)?: A Little Help needed to walk in hospital room?: A Little Help needed climbing 3-5 steps with a railing? : A Lot 6 Click Score: 15    End of Session Equipment Utilized During Treatment: Gait belt Activity Tolerance: Patient tolerated treatment well;Patient limited by pain Patient left: in bed;with call bell/phone within reach Nurse Communication: Mobility status PT Visit Diagnosis: Unsteadiness on feet (R26.81);Pain;Muscle weakness (generalized) (M62.81) Pain - Right/Left: Left Pain - part of body: Hip     Time: 8185-6314 PT Time Calculation (min) (ACUTE ONLY): 24 min  Charges:  $Gait Training: 8-22 mins $Therapeutic Activity: 8-22 mins                     [name], PT, DPT Acute Rehabilitation Services Pager [phone] Office [phone]       [name] 09/11/2020, 8:37 AM

## 2020-09-11 NOTE — Progress Notes (Signed)
CSW confirmed with patient that he has been set up preoperatively with [facility] for [facility]. CSW made [facility] aware of patient's discharge.   Sharian Delia LCSW

## 2020-09-11 NOTE — Progress Notes (Addendum)
Subjective: 1 Day Post-Op Procedure(s) (LRB): LEFT TOTAL HIP ARTHROPLASTY ANTERIOR APPROACH (Left) Patient reports pain as mild.    Objective: Vital signs in last 24 hours: Temp:  [97.1 F (36.2 C)-99.1 F (37.3 C)] 99.1 F (37.3 C) (12/07 0518) Pulse Rate:  [70-92] 92 (12/07 0518) Resp:  [16-20] 18 (12/07 0518) BP: (97-116)/(50-73) 114/61 (12/07 0518) SpO2:  [97 %-100 %] 99 % (12/07 0518)  Intake/Output from previous day: 12/06 0701 - 12/07 0700 In: 2040 [P.O.:240; I.V.:1200; IV Piggyback:600] Out: 1825 [Urine:925; Blood:900] Intake/Output this shift: No intake/output data recorded.  No results for input(s): HGB in the last 72 hours. No results for input(s): WBC, RBC, HCT, PLT in the last 72 hours. No results for input(s): NA, K, CL, CO2, BUN, CREATININE, GLUCOSE, CALCIUM in the last 72 hours. No results for input(s): LABPT, INR in the last 72 hours.  Neurologically intact Neurovascular intact Sensation intact distally Intact pulses distally Dorsiflexion/Plantar flexion intact Incision: dressing C/D/I No cellulitis present Compartment soft   Assessment/Plan: 1 Day Post-Op Procedure(s) (LRB): LEFT TOTAL HIP ARTHROPLASTY ANTERIOR APPROACH (Left) Advance diet Up with therapy D/C IV fluids Discharge home with home health after second session today as long as he continues to mobilize with PT WBAT LLE- knee immobilizer at all times x 2 weeks F/u with Dr. Roda Shutters 2 weeks post-op      Cristie Hem 09/11/2020, 7:46 AM

## 2020-09-11 NOTE — Progress Notes (Signed)
Physical Therapy Treatment Patient Details Name: Philip Bates MRN: [id number] DOB: October 27, 1960 Today's Date: 09/11/2020    History of Present Illness Pt is a 59 y/o male s/p L THA, direct anterior. PMH includes cerebral palsy, s/p ACDF, and pre-diabetes.     PT Comments    Patient progressing well this afternoon and mobility is much improved without wearing knee immobilizer when OOB. Able to get to EOB without assist now that pt is able to flex at hip/knee. Continues to require some assist to bring LEs into bed to return to supine. Session focused on gait training without use of KI and stair training. Wife present for stair training; tolerated with Min A backwards with wife stabilizing RW. Provided gait belt for assist at home as well. Pt reports increased pain this afternoon with mobility. Reviewed importance of wearing KI while in bed but can take it off for OOB. Instructed wife on how to donn/doff KI.  Pt reports he does not feel ready to d/c home today. Will follow for further stair training next session prior to d/c.   Follow Up Recommendations  Follow surgeon's recommendation for DC plan and follow-up therapies;Supervision for mobility/OOB     Equipment Recommendations  None recommended by PT    Recommendations for Other Services       Precautions / Restrictions Precautions Precautions: Fall Precaution Comments:  Required Braces or Orthoses: Knee Immobilizer - Left (does not need to be donned during walking, only when in bed) Knee Immobilizer - Left: Other (comment) (after session MD reports can have off for ambulation) Restrictions Weight Bearing Restrictions: Yes LLE Weight Bearing: Weight bearing as tolerated    Mobility  Bed Mobility Overal bed mobility: Needs Assistance Bed Mobility: Supine to Sit     Supine to sit: Min guard;HOB elevated Sit to supine: Mod assist   General bed mobility comments: Able to bring LLE to EOB and elevate trunk without physical assist  today with KI doffed; needs help bringing BLEs into bed, hooking RLE under LLE.  Transfers Overall transfer level: Needs assistance Equipment used: Rolling walker (2 wheeled) Transfers: Sit to/from Stand Sit to Stand: Min guard Stand pivot transfers: Min guard       General transfer comment: Min guard for safety. Better able to stand without KI, however continues to have limited hip flexion and heavy use of UEs, trunk rotation to get upright; stood from EOB x1, from chair x2. SPT bed to chair with Min guard assist.  Ambulation/Gait Ambulation/Gait assistance: Min assist;Min guard Gait Distance (Feet): 75 Feet Assistive device: Rolling walker (2 wheeled) Gait Pattern/deviations: Step-to pattern;Step-through pattern;Decreased stance time - left;Decreased step length - right;Narrow base of support;Decreased dorsiflexion - left Gait velocity: decreased   General Gait Details: Slow, mildly unsteady gait wtih narrow BoS and hip/knee adduction, dragging LLE behind when fatigued needing cues to advance for step through gait. heavy reliance on UEs.   Stairs Stairs: Yes Stairs assistance: Min assist Stair Management: Step to pattern;Backwards;With walker Number of Stairs: 2 General stair comments: Cues for technique/safety; performed x2. Wife present and performed family training on how to stabilize RW for safety.   Wheelchair Mobility    Modified Rankin (Stroke Patients Only)       Balance Overall balance assessment: Needs assistance Sitting-balance support: Feet supported;Bilateral upper extremity supported Sitting balance-Leahy Scale: Poor Sitting balance - Comments: Requires UE support due to pain   Standing balance support: During functional activity Standing balance-Leahy Scale: Poor Standing balance comment: Requires BUE support on  RW.                            Cognition Arousal/Alertness: Awake/alert Behavior During Therapy: WFL for tasks  assessed/performed Overall Cognitive Status: Within Functional Limits for tasks assessed                                        Exercises      General Comments General comments (skin integrity, edema, etc.): Wife present during session.      Pertinent Vitals/Pain Pain Assessment: 0-10 Pain Score: 10-Worst pain ever Pain Location: left hip Pain Descriptors / Indicators: Sore;Operative site guarding;Grimacing;Guarding;Moaning;Restless Pain Intervention(s): Monitored during session;Repositioned    Home Living Family/patient expects to be discharged to:: Private residence Living Arrangements: Spouse/significant other Available Help at Discharge: Family Type of Home: House Home Access: Stairs to enter Entrance Stairs-Rails: None Home Layout: One level Home Equipment: Environmental consultant - 2 wheels;Bedside commode;Cane - single point Additional Comments: will have wife (A)    Prior Function Level of Independence: Independent with assistive device(s)      Comments: Reports sometimes uses a cane   PT Goals (current goals can now be found in the care plan section) Acute Rehab PT Goals Patient Stated Goal: to be able to get home soon Progress towards PT goals: Progressing toward goals    Frequency    7X/week      PT Plan Current plan remains appropriate    Co-evaluation              AM-PAC PT "6 Clicks" Mobility   Outcome Measure  Help needed turning from your back to your side while in a flat bed without using bedrails?: A Little Help needed moving from lying on your back to sitting on the side of a flat bed without using bedrails?: A Lot Help needed moving to and from a bed to a chair (including a wheelchair)?: A Little Help needed standing up from a chair using your arms (e.g., wheelchair or bedside chair)?: A Little Help needed to walk in hospital room?: A Little Help needed climbing 3-5 steps with a railing? : A Little 6 Click Score: 17    End of  Session Equipment Utilized During Treatment: Gait belt Activity Tolerance: Patient tolerated treatment well Patient left: in bed;with call bell/phone within reach;with family/visitor present Nurse Communication: Mobility status PT Visit Diagnosis: Unsteadiness on feet (R26.81);Pain;Muscle weakness (generalized) (M62.81) Pain - Right/Left: Left Pain - part of body: Hip     Time: 7672-0947 PT Time Calculation (min) (ACUTE ONLY): 26 min  Charges:  $Gait Training: 23-37 mins                     Philip Bates, PT, DPT Acute Rehabilitation Services Pager [phone] Office [phone]       Philip Bates 09/11/2020, 2:47 PM

## 2020-09-11 NOTE — Evaluation (Signed)
Occupational Therapy Evaluation Patient Details Name: Philip Bates MRN: [id number] DOB: 1961-03-30 Today's Date: 09/11/2020    History of Present Illness Pt is a 59 y/o male s/p L THA, direct anterior. PMH includes cerebral palsy, s/p ACDF, and pre-diabetes.    Clinical Impression   Patient is s/p L THA direct anterior surgery resulting in functional limitations due to the deficits listed below (see OT problem list). Pt currently with balance deficits. MD made aware of near fall with brace use. Communicate with PT for second session. Pt unable to reach L knee for don doff KI. Family will have to help with don doff.  Patient will benefit from skilled OT acutely to increase independence and safety with ADLS to allow discharge HHOT.     Follow Up Recommendations  Home health OT    Equipment Recommendations  Other (comment) (shower seat that wife plans to purchase per RN staff)    Recommendations for Other Services       Precautions / Restrictions Precautions Precautions: Fall Precaution Comments: orders state wear at all times x2 weeks Required Braces or Orthoses: Knee Immobilizer - Left Knee Immobilizer - Left: Other (comment) (after session MD reports can have off for ambulation) Restrictions Weight Bearing Restrictions: No      Mobility Bed Mobility Overal bed mobility: Needs Assistance Bed Mobility: Supine to Sit;Sit to Supine     Supine to sit: Min guard Sit to supine: Mod assist   General bed mobility comments: pt requires (A) of trunk position and then bil LE onto bed surface.     Transfers Overall transfer level: Needs assistance Equipment used: Rolling walker (2 wheeled) Transfers: Sit to/from Stand Sit to Stand: Min assist         General transfer comment: pt unable to preform hip flexion so elevating trunk with arms and trunk rotation to motor plan the transfer. pt c/o "I can't get up with this brace"     Balance Overall balance assessment: Needs  assistance Sitting-balance support: Bilateral upper extremity supported;Feet supported Sitting balance-Leahy Scale: Poor Sitting balance - Comments: pt unable to hip flex with brace one   Standing balance support: Bilateral upper extremity supported;During functional activity Standing balance-Leahy Scale: Poor Standing balance comment: requires BIL UE and max (A) to position L LE                           ADL either performed or assessed with clinical judgement   ADL Overall ADL's : Needs assistance/impaired     Grooming: Wash/dry face;Supervision/safety;Bed level Grooming Details (indicate cue type and reason): wiping face off after vomitting             Lower Body Dressing: Maximal assistance   Toilet Transfer: Moderate assistance;RW Toilet Transfer Details (indicate cue type and reason): pt lateral R side LOB during session and falling into side wall beside toilet during session. OT giving max (A) to sustain static standing to void bladder.  Toileting- Clothing Manipulation and Hygiene: Minimal assistance       Functional mobility during ADLs: Moderate assistance;Rolling walker General ADL Comments: pt demonstrate decrease quad advancement of L LE during session. pt dragging L LE at this time. pt with LOB in bathroom with near fall. pt vomiting after transfer into trash can. pt positined in side lying after nausea passed. pt reports being very cold so provided warm blanket. pt reports feeling better  Educated on washing around incision with clean wash cloth.  Vision Baseline Vision/History: No visual deficits Vision Assessment?: No apparent visual deficits     Perception     Praxis      Pertinent Vitals/Pain Pain Assessment: No/denies pain Pain Location: vomiting during session      Hand Dominance Right   Extremity/Trunk Assessment Upper Extremity Assessment Upper Extremity Assessment: Overall WFL for tasks assessed   Lower Extremity  Assessment Lower Extremity Assessment: Defer to PT evaluation LLE Deficits / Details: dragging L foot unable to advance and crossing into R LE. pt reports disliking brace. Pt needs max (A) for positioning L LE duringout session   Cervical / Trunk Assessment Cervical / Trunk Assessment: Kyphotic;Other exceptions (curve to spine noted with visualization of trunk )   Communication Communication Communication: No difficulties   Cognition Arousal/Alertness: Awake/alert Behavior During Therapy: WFL for tasks assessed/performed Overall Cognitive Status: Within Functional Limits for tasks assessed                                     General Comments       Exercises     Shoulder Instructions      Home Living Family/patient expects to be discharged to:: Private residence Living Arrangements: Spouse/significant other Available Help at Discharge: Family Type of Home: House Home Access: Stairs to enter Secretary/administrator of Steps: 2 Entrance Stairs-Rails: None Home Layout: One level     Bathroom Shower/Tub: Chief Strategy Officer: Standard     Home Equipment: Environmental consultant - 2 wheels;Bedside commode;Cane - single point   Additional Comments: will have wife (A)      Prior Functioning/Environment Level of Independence: Independent with assistive device(s)        Comments: Reports sometimes uses a cane        OT Problem List: Decreased strength;Decreased activity tolerance;Decreased range of motion;Impaired balance (sitting and/or standing);Decreased coordination;Decreased safety awareness;Decreased knowledge of use of DME or AE;Decreased knowledge of precautions      OT Treatment/Interventions: Self-care/ADL training;Therapeutic exercise;Energy conservation;DME and/or AE instruction;Manual therapy;Modalities;Therapeutic activities;Balance training;Patient/family education    OT Goals(Current goals can be found in the care plan section) Acute Rehab OT  Goals Patient Stated Goal: to be able to get home soon OT Goal Formulation: With patient Time For Goal Achievement: 09/25/20 Potential to Achieve Goals: Good  OT Frequency: Min 3X/week   Barriers to D/C:            Co-evaluation              AM-PAC OT "6 Clicks" Daily Activity     Outcome Measure Help from another person eating meals?: None Help from another person taking care of personal grooming?: A Little Help from another person toileting, which includes using toliet, bedpan, or urinal?: A Lot Help from another person bathing (including washing, rinsing, drying)?: A Lot Help from another person to put on and taking off regular upper body clothing?: A Little Help from another person to put on and taking off regular lower body clothing?: A Lot 6 Click Score: 16   End of Session Equipment Utilized During Treatment: Rolling walker;Left knee immobilizer Nurse Communication: Mobility status;Precautions  Activity Tolerance: Treatment limited secondary to medical complications (Comment) (vomitting) Patient left: in bed;with call bell/phone within reach  OT Visit Diagnosis: Unsteadiness on feet (R26.81);Muscle weakness (generalized) (M62.81)                Time: 5625-6389 OT Time Calculation (min):  20 min Charges:  OT General Charges $OT Visit: 1 Visit OT Evaluation $OT Eval Moderate Complexity: 1 Mod   Brynn, OTR/L  Acute Rehabilitation Services Pager: [phone] Office: [phone] .   Mateo Flow 09/11/2020, 1:56 PM
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
## 2020-09-12 DIAGNOSIS — M1612 Unilateral primary osteoarthritis, left hip: Secondary | ICD-10-CM | POA: Diagnosis not present

## 2020-09-12 NOTE — Progress Notes (Signed)
Physical Therapy Treatment Patient Details Name: Philip Bates MRN: [id number] DOB: 11-Jul-1961 Today's Date: 09/12/2020    History of Present Illness Pt is a 59 y/o male s/p L THA, direct anterior. PMH includes cerebral palsy, s/p ACDF, and pre-diabetes.     PT Comments    Pt is limited by L hip pain during session, having difficulty mobilizing this extremity during session. Pt unable to achieve foot flat during gait or standing and initially requires PT support of LE to mobilize in bed or when attempting to advance leg during ambulation. Pt refusing to wear knee immobilizer in bed at this time. Pt and spouse requesting increased pain meds, RN and MD made aware. Pt will continue to benefit from acute PT POC to improve activity tolerance and gait quality.   Follow Up Recommendations  Follow surgeon's recommendation for DC plan and follow-up therapies;Supervision for mobility/OOB     Equipment Recommendations  None recommended by PT    Recommendations for Other Services       Precautions / Restrictions Precautions Precautions: Fall Precaution Comments: pt refusing knee immobilizer in bed Required Braces or Orthoses: Knee Immobilizer - Left (donned in bed, not needed during walking) Restrictions Weight Bearing Restrictions: Yes LLE Weight Bearing: Weight bearing as tolerated    Mobility  Bed Mobility Overal bed mobility: Needs Assistance Bed Mobility: Supine to Sit;Sit to Supine;Rolling Rolling: Supervision   Supine to sit: Min assist Sit to supine: Mod assist   General bed mobility comments: minA for sup to sit for LLE management, modA for BLE management for sit to sup  Transfers Overall transfer level: Needs assistance Equipment used: Rolling walker (2 wheeled) Transfers: Sit to/from Stand Sit to Stand: Supervision            Ambulation/Gait Ambulation/Gait assistance: Min assist Gait Distance (Feet): 15 Feet Assistive device: Rolling walker (2 wheeled) Gait  Pattern/deviations: Step-to pattern (hop-to) Gait velocity: reduced Gait velocity interpretation: <1.31 ft/sec, indicative of household ambulator General Gait Details: pt unable to advance LLE initially during ambulation, unable to achieve foot flat due to increased spasticity resulting in knee flexion and plantarflexion. Pt progresses to hop-to gait to return to bed, not attempting to touch LLE to ground   Stairs             Wheelchair Mobility    Modified Rankin (Stroke Patients Only)       Balance Overall balance assessment: Needs assistance Sitting-balance support: Single extremity supported;Feet supported Sitting balance-Leahy Scale: Poor Sitting balance - Comments: reliant on UE support    Standing balance support: Bilateral upper extremity supported Standing balance-Leahy Scale: Poor Standing balance comment: reliant on BUE support of RW                            Cognition Arousal/Alertness: Awake/alert Behavior During Therapy: WFL for tasks assessed/performed Overall Cognitive Status: Within Functional Limits for tasks assessed                                        Exercises      General Comments General comments (skin integrity, edema, etc.): Spouse present during session, pt and spouse requesting more pain meds for patient. Pt pre-medicated prior to session, RN made aware of pt's reports of pain      Pertinent Vitals/Pain Pain Assessment: Faces Faces Pain Scale: Hurts whole lot Pain Location:  L hip Pain Descriptors / Indicators: Grimacing Pain Intervention(s): Premedicated before session    Home Living                      Prior Function            PT Goals (current goals can now be found in the care plan section) Acute Rehab PT Goals Patient Stated Goal: to reduce pain Progress towards PT goals: Not progressing toward goals - comment (limited by pain)    Frequency    7X/week      PT Plan Current  plan remains appropriate    Co-evaluation              AM-PAC PT "6 Clicks" Mobility   Outcome Measure  Help needed turning from your back to your side while in a flat bed without using bedrails?: A Little Help needed moving from lying on your back to sitting on the side of a flat bed without using bedrails?: A Little Help needed moving to and from a bed to a chair (including a wheelchair)?: A Little Help needed standing up from a chair using your arms (e.g., wheelchair or bedside chair)?: A Little Help needed to walk in hospital room?: A Little Help needed climbing 3-5 steps with a railing? : A Lot 6 Click Score: 17    End of Session Equipment Utilized During Treatment: Gait belt Activity Tolerance: Patient limited by pain Patient left: in bed;with call bell/phone within reach;with family/visitor present Nurse Communication: Mobility status PT Visit Diagnosis: Unsteadiness on feet (R26.81);Pain;Muscle weakness (generalized) (M62.81) Pain - Right/Left: Left Pain - part of body: Hip     Time: 7782-4235 PT Time Calculation (min) (ACUTE ONLY): 23 min  Charges:  $Gait Training: 8-22 mins $Therapeutic Activity: 8-22 mins                     Arlyss Gandy, PT, DPT Acute Rehabilitation Pager: [phone]    Arlyss Gandy 09/12/2020, 10:02 AM

## 2020-09-12 NOTE — Progress Notes (Signed)
Patient stable.  No events. Surgical dressing c/d/i AFO ordered  Anticipate clearing PT this morning D/c home today  N. Glee Arvin, MD [facility] [phone] 7:34 AM

## 2020-09-12 NOTE — Progress Notes (Signed)
Orthopedic Tech Progress Note Patient Details:  Philip Bates [birth year] [id number] Called in order to [facility] for an AFO Patient ID: Philip Bates, male   DOB: [birth year], 59 y.o.   MRN: [id number]   Philip Bates 09/12/2020, 8:17 AM
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
## 2020-09-12 NOTE — Plan of Care (Signed)
Patient alert and oriented, mae's well, voiding adequate amount of urine, swallowing without difficulty, no c/o pain at time of discharge. Patient discharged home with family. Script and discharged instructions given to patient. Patient and family stated understanding of instructions given. Patient has an appointment with Dr. Xu 

## 2020-09-12 NOTE — Progress Notes (Signed)
Occupational Therapy Treatment Patient Details Name: Philip Bates MRN: [id number] DOB: Mar 18, 1961 Today's Date: 09/12/2020    History of present illness Pt is a 59 y/o male s/p L THA, direct anterior. PMH includes cerebral palsy, s/p ACDF, and pre-diabetes.    OT comments  Pt completed dressing this session with extensive (A) from OT / wife while getting education on dressing LLE first. Educated on bathing with fresh linen and washing around incision site. Pt guarded about d/c home when asked directly and concerned he wont be able to rest. Wife concerned about pt being able to get into the house after this session. Recommendation HHOT at this time.   Follow Up Recommendations  Home health OT    Equipment Recommendations  Other (comment)    Recommendations for Other Services      Precautions / Restrictions Precautions Precautions: Fall Precaution Comments: per MD order for knee immobilizer discontinued Required Braces or Orthoses: Knee Immobilizer - Left Restrictions Weight Bearing Restrictions: Yes LLE Weight Bearing: Weight bearing as tolerated       Mobility Bed Mobility Overal bed mobility: Needs Assistance Bed Mobility: Supine to Sit Rolling: Mod assist   Supine to sit: Min assist Sit to supine: Mod assist   General bed mobility comments: minA for LLE management  Transfers Overall transfer level: Needs assistance Equipment used: Rolling walker (2 wheeled) Transfers: Sit to/from Stand Sit to Stand: Supervision         General transfer comment: elevated bed surface by patient to allow him to complete standing. pt states its has to be high i can't    Balance Overall balance assessment: Needs assistance Sitting-balance support: Feet supported;Single extremity supported Sitting balance-Leahy Scale: Poor Sitting balance - Comments: reliant on UE support    Standing balance support: Bilateral upper extremity supported Standing balance-Leahy Scale: Poor Standing  balance comment: reliant on BUE support of RW                           ADL either performed or assessed with clinical judgement   ADL Overall ADL's : Needs assistance/impaired   Eating/Feeding Details (indicate cue type and reason): nauseated and spitting into blue bag              Upper Body Dressing : Moderate assistance   Lower Body Dressing: Maximal assistance Lower Body Dressing Details (indicate cue type and reason): able to lift R LE to help and push down into his shoes. pt able to static stand for don of pants but unable to (A). pt requires total (A) for LLE  sock pants underwear               General ADL Comments: wife present and (A) with dresssing for home. wife requesting PT return prior to d/c home. pt report he can get into the house but not right now. wife educated on moving car as close to the door as possible as the patient will have uneven grass to cross     Vision       Perception     Praxis      Cognition Arousal/Alertness: Awake/alert Behavior During Therapy: WFL for tasks assessed/performed Overall Cognitive Status: Within Functional Limits for tasks assessed                                          Exercises  Shoulder Instructions       General Comments VSS, spouse and pt with improved confidence in pt mobility at end of session    Pertinent Vitals/ Pain       Pain Assessment: Faces Faces Pain Scale: Hurts whole lot Pain Location: L hip Pain Descriptors / Indicators: Grimacing Pain Intervention(s): Monitored during session  Home Living                                          Prior Functioning/Environment              Frequency  Min 3X/week        Progress Toward Goals  OT Goals(current goals can now be found in the care plan section)  Progress towards OT goals: Progressing toward goals  Acute Rehab OT Goals Patient Stated Goal: to go home OT Goal Formulation: With  patient Time For Goal Achievement: 09/25/20 Potential to Achieve Goals: Good ADL Goals Pt Will Perform Lower Body Dressing: with min assist;with adaptive equipment;sit to/from stand Pt Will Transfer to Toilet: with min guard assist;ambulating;regular height toilet Additional ADL Goal #1: pt will complete bed mobility supervision level as precursor to adls.  Plan Discharge plan remains appropriate    Co-evaluation                 AM-PAC OT "6 Clicks" Daily Activity     Outcome Measure   Help from another person eating meals?: None Help from another person taking care of personal grooming?: A Little Help from another person toileting, which includes using toliet, bedpan, or urinal?: A Lot Help from another person bathing (including washing, rinsing, drying)?: A Lot Help from another person to put on and taking off regular upper body clothing?: A Little Help from another person to put on and taking off regular lower body clothing?: A Lot 6 Click Score: 16    End of Session Equipment Utilized During Treatment: Rolling walker  OT Visit Diagnosis: Unsteadiness on feet (R26.81);Muscle weakness (generalized) (M62.81)   Activity Tolerance Patient tolerated treatment well   Patient Left in bed;with call bell/phone within reach;with family/visitor present   Nurse Communication Mobility status;Precautions        Time: 1021 (1021)-1046 OT Time Calculation (min): 25 min  Charges: OT General Charges $OT Visit: 1 Visit OT Treatments $Self Care/Home Management : 23-37 mins   Brynn, OTR/L  Acute Rehabilitation Services Pager: [phone] Office: [phone] .    Mateo Flow 09/12/2020, 11:44 AM

## 2020-09-12 NOTE — Progress Notes (Signed)
Physical Therapy Treatment Patient Details Name: Philip Bates MRN: [id number] DOB: [birth year] Today's Date: 09/12/2020    History of Present Illness Pt is a 59 y/o male s/p L THA, direct anterior. PMH includes cerebral palsy, s/p ACDF, and pre-diabetes.     PT Comments    Pt tolerates treatment well with improved gait quality and distance compared to earlier morning session. Pt is able to perform all out of bed mobility with close supervision at this time while utilizing RW. Pt does require some assistance for LLE mobility initially in bed. Pt will benefit from PT POC to improve gait quality and continue to gradually increase WB through LLE.   Follow Up Recommendations  Follow surgeon's recommendation for DC plan and follow-up therapies;Supervision for mobility/OOB     Equipment Recommendations  Rolling walker with 5" wheels    Recommendations for Other Services       Precautions / Restrictions Precautions Precautions: Fall Precaution Comments: per MD order for knee immobilizer discontinued Required Braces or Orthoses: (P) Knee Immobilizer - Left Restrictions Weight Bearing Restrictions: Yes LLE Weight Bearing: Weight bearing as tolerated    Mobility  Bed Mobility Overal bed mobility: Needs Assistance Bed Mobility: Supine to Sit Rolling: (P) Mod assist   Supine to sit: Min assist Sit to supine: (P) Mod assist   General bed mobility comments: minA for LLE management  Transfers Overall transfer level: Needs assistance Equipment used: Rolling walker (2 wheeled) Transfers: Sit to/from Stand Sit to Stand: Supervision            Ambulation/Gait Ambulation/Gait assistance: Min guard;Supervision (minG progressing to supervision) Gait Distance (Feet): 60 Feet Assistive device: Rolling walker (2 wheeled) Gait Pattern/deviations: Step-to pattern Gait velocity: reduced Gait velocity interpretation: <1.31 ft/sec, indicative of household ambulator General Gait Details: pt  with short step-to gait, improved clearance of LLE when advancing LLE first prior to RLE, continues to demonstrate toe walking but reports this to be baseline on LLE   Stairs Stairs: Yes Stairs assistance: Min guard Stair Management: Step to pattern;Backwards;With walker Number of Stairs: 1 (x2)     Wheelchair Mobility    Modified Rankin (Stroke Patients Only)       Balance Overall balance assessment: Needs assistance Sitting-balance support: Feet supported;Single extremity supported Sitting balance-Leahy Scale: Poor Sitting balance - Comments: reliant on UE support    Standing balance support: Bilateral upper extremity supported Standing balance-Leahy Scale: Poor Standing balance comment: reliant on BUE support of RW                            Cognition Arousal/Alertness: Awake/alert Behavior During Therapy: WFL for tasks assessed/performed Overall Cognitive Status: Within Functional Limits for tasks assessed                                        Exercises      General Comments General comments (skin integrity, edema, etc.): VSS, spouse and pt with improved confidence in pt mobility at end of session      Pertinent Vitals/Pain Pain Assessment: Faces Faces Pain Scale: Hurts whole lot Pain Location: L hip Pain Descriptors / Indicators: Grimacing Pain Intervention(s): Monitored during session    Home Living                      Prior Function  PT Goals (current goals can now be found in the care plan section) Acute Rehab PT Goals Patient Stated Goal: to go home Progress towards PT goals: Progressing toward goals    Frequency    7X/week      PT Plan Current plan remains appropriate    Co-evaluation              AM-PAC PT "6 Clicks" Mobility   Outcome Measure  Help needed turning from your back to your side while in a flat bed without using bedrails?: A Little Help needed moving from lying on  your back to sitting on the side of a flat bed without using bedrails?: A Little Help needed moving to and from a bed to a chair (including a wheelchair)?: A Little Help needed standing up from a chair using your arms (e.g., wheelchair or bedside chair)?: A Little Help needed to walk in hospital room?: A Little Help needed climbing 3-5 steps with a railing? : A Little 6 Click Score: 18    End of Session Equipment Utilized During Treatment: Gait belt Activity Tolerance: Patient tolerated treatment well Patient left: in bed;with call bell/phone within reach;with family/visitor present Nurse Communication: Mobility status PT Visit Diagnosis: Unsteadiness on feet (R26.81);Pain;Muscle weakness (generalized) (M62.81) Pain - Right/Left: Left Pain - part of body: Hip     Time: 8366-2947 PT Time Calculation (min) (ACUTE ONLY): 23 min  Charges:  $Gait Training: 23-37 mins $Therapeutic Activity: 8-22 mins                     Arlyss Gandy, PT, DPT Acute Rehabilitation Pager: [phone]    Arlyss Gandy 09/12/2020, 11:41 AM

## 2020-09-13 ENCOUNTER — Telehealth: Payer: Self-pay | Admitting: Orthopaedic Surgery

## 2020-09-13 ENCOUNTER — Telehealth: Payer: Self-pay

## 2020-09-13 MED ORDER — KETOROLAC TROMETHAMINE 10 MG PO TABS: 10.0000 mg | ORAL_TABLET | Freq: Two times a day (BID) | ORAL | 0 refills | Status: DC | PRN

## 2020-09-13 MED ORDER — HYDROMORPHONE HCL 2 MG PO TABS
2.0000 mg | ORAL_TABLET | ORAL | 0 refills | Status: DC | PRN
Start: 2020-09-13 — End: 2020-09-18

## 2020-09-13 NOTE — Telephone Encounter (Signed)
I sent dilaudid and toradol

## 2020-09-13 NOTE — Telephone Encounter (Signed)
2nd message.  I had already sent you a msg on patient.

## 2020-09-13 NOTE — Telephone Encounter (Signed)
Patient's wife Philip Bates called advised the Rx for pain Dilaudid is not covered by the insurance and asked if another pain medication can be called in for the patient? The number to contact Philip Bates is [phone]

## 2020-09-13 NOTE — Addendum Note (Signed)
Addended by: Mayra Reel on: 09/13/2020 04:16 PM   Modules accepted: Orders

## 2020-09-13 NOTE — Telephone Encounter (Signed)
Dorene Sorrow called and states PT is Taking 1-2 tabs Oxycodone every 6 hours and He is still having so much pain.  He was unable to do any HHPT.  Please advise.

## 2020-09-13 NOTE — Telephone Encounter (Signed)
Patients wife called she saated patient just had surgery and he is on oxycodone and the medication is not working, wife stated patient was unable to perform exercises in pt due to being in so much pain. [phone]

## 2020-09-14 NOTE — Telephone Encounter (Signed)
Called pharmacy and they state the insurance won't approve since he just had Oxycodone and is over the [employer] narcotic number at this time, she states she will let him get it out of pocket since we were the ones that filled the oxycodone Patient also aware

## 2020-09-17 ENCOUNTER — Telehealth: Payer: Self-pay | Admitting: Orthopaedic Surgery

## 2020-09-17 ENCOUNTER — Other Ambulatory Visit: Payer: Self-pay | Admitting: Physician Assistant

## 2020-09-17 NOTE — Telephone Encounter (Signed)
Please advise 

## 2020-09-17 NOTE — Telephone Encounter (Signed)
Pt called asking for a refill of his hydromorphone and he would like to be notified when this is done.   [phone]

## 2020-09-17 NOTE — Telephone Encounter (Signed)
Called and spoke with patient's wife. I explained that he was not to get a refill of the hydromorphone, but should instead use the oxycodone that was originally prescribed after surgery. She understands.

## 2020-09-17 NOTE — Telephone Encounter (Signed)
Cannot refill this.  Should also have percocet?

## 2020-09-18 ENCOUNTER — Telehealth: Payer: Self-pay

## 2020-09-18 ENCOUNTER — Other Ambulatory Visit: Payer: Self-pay | Admitting: Physician Assistant

## 2020-09-18 MED ORDER — HYDROMORPHONE HCL 2 MG PO TABS: 2.0000 mg | ORAL_TABLET | Freq: Four times a day (QID) | ORAL | 0 refills | Status: DC | PRN

## 2020-09-18 NOTE — Telephone Encounter (Signed)
Called patient. He said that he has taken all the dilaudid because he did not know he was suppose to take it sparingly. He is taking oxycodone three times daily, along with the Robaxin every 6 hours. He is is in a lot pain and wants to know what he should do. Thanks!

## 2020-09-18 NOTE — Telephone Encounter (Signed)
I just sent in the new rx for dilaudid.  Use this one sparingly

## 2020-09-18 NOTE — Telephone Encounter (Signed)
Spoke with patient. He is aware that he should take only the dilaudid for severe pain, otherwise just use the oxycodone. He states that he also wants his oxycodone increased to 10mg . He is currently under pain management as well. I reiterated what was being prescribed, and if he felt it was not enough he needed to contact his pain management. He said that he did not have an appointment with them until later this month. I suggested that he call them just as he is able to call . Patient understands.

## 2020-09-18 NOTE — Telephone Encounter (Signed)
I sent in another rx for dilaudid from breakthrough pain.  Use sparingly.  Also, is he taking the robaxin?

## 2020-09-18 NOTE — Telephone Encounter (Signed)
Ok, thanks.

## 2020-09-18 NOTE — Telephone Encounter (Signed)
Please see below.

## 2020-09-18 NOTE — Telephone Encounter (Signed)
Patient called stating oxycodone isn't working he is requesting a new rx something he hasn't had, something that will work. CB:[phone]

## 2020-09-19 ENCOUNTER — Other Ambulatory Visit: Payer: Self-pay | Admitting: Physician Assistant

## 2020-09-19 ENCOUNTER — Telehealth: Payer: Self-pay | Admitting: Orthopaedic Surgery

## 2020-09-19 MED ORDER — METHOCARBAMOL 500 MG PO TABS
500.0000 mg | ORAL_TABLET | Freq: Two times a day (BID) | ORAL | 0 refills | Status: DC | PRN
Start: 2020-09-19 — End: 2020-10-01

## 2020-09-19 NOTE — Telephone Encounter (Signed)
Please advise 

## 2020-09-19 NOTE — Telephone Encounter (Signed)
I called patient and advised. 

## 2020-09-19 NOTE — Telephone Encounter (Signed)
Pt wife called and says her husband needs a refill on methocarbol

## 2020-09-19 NOTE — Telephone Encounter (Signed)
I just sent in

## 2020-09-21 ENCOUNTER — Other Ambulatory Visit: Payer: Self-pay

## 2020-09-21 ENCOUNTER — Encounter: Payer: Self-pay | Admitting: Orthopaedic Surgery

## 2020-09-21 ENCOUNTER — Ambulatory Visit (INDEPENDENT_AMBULATORY_CARE_PROVIDER_SITE_OTHER): Payer: Medicare HMO | Admitting: Physician Assistant

## 2020-09-21 DIAGNOSIS — Z96642 Presence of left artificial hip joint: Secondary | ICD-10-CM

## 2020-09-21 MED ORDER — CEPHALEXIN 500 MG PO CAPS: 500.0000 mg | ORAL_CAPSULE | Freq: Four times a day (QID) | ORAL | 0 refills | Status: DC
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
## 2020-09-25 ENCOUNTER — Ambulatory Visit (INDEPENDENT_AMBULATORY_CARE_PROVIDER_SITE_OTHER): Payer: Medicare HMO

## 2020-09-25 ENCOUNTER — Encounter: Payer: Self-pay | Admitting: Orthopaedic Surgery

## 2020-09-25 ENCOUNTER — Other Ambulatory Visit: Payer: Self-pay

## 2020-09-25 ENCOUNTER — Ambulatory Visit (INDEPENDENT_AMBULATORY_CARE_PROVIDER_SITE_OTHER): Payer: Medicare HMO | Admitting: Orthopaedic Surgery

## 2020-09-25 DIAGNOSIS — Z96642 Presence of left artificial hip joint: Secondary | ICD-10-CM | POA: Diagnosis not present

## 2020-09-25 MED ORDER — OXYCODONE-ACETAMINOPHEN 5-325 MG PO TABS
1.0000 | ORAL_TABLET | Freq: Three times a day (TID) | ORAL | 0 refills | Status: DC | PRN
Start: 2020-09-25 — End: 2020-10-03

## 2020-09-25 MED ORDER — LIDOCAINE HCL 1 % IJ SOLN
5.0000 mL | INTRAMUSCULAR | Status: AC | PRN
  Administered 2020-09-25: 5 mL
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
## 2020-09-26 ENCOUNTER — Telehealth: Payer: Self-pay | Admitting: Orthopaedic Surgery

## 2020-09-26 DIAGNOSIS — Z96642 Presence of left artificial hip joint: Secondary | ICD-10-CM

## 2020-09-26 NOTE — Telephone Encounter (Signed)
PT referral placed in chart and lvm informing pt.

## 2020-09-26 NOTE — Telephone Encounter (Signed)
Patient called. He would like a referral for PT.

## 2020-09-26 NOTE — Telephone Encounter (Signed)
Ok for referral?

## 2020-09-26 NOTE — Telephone Encounter (Signed)
Ok to do

## 2020-10-01 ENCOUNTER — Telehealth: Payer: Self-pay

## 2020-10-01 ENCOUNTER — Other Ambulatory Visit: Payer: Self-pay | Admitting: Physician Assistant

## 2020-10-01 MED ORDER — METHOCARBAMOL 500 MG PO TABS
500.0000 mg | ORAL_TABLET | Freq: Two times a day (BID) | ORAL | 0 refills | Status: DC | PRN
Start: 2020-10-01 — End: 2021-04-09

## 2020-10-01 NOTE — Telephone Encounter (Signed)
Please advise 

## 2020-10-01 NOTE — Telephone Encounter (Signed)
Sent in

## 2020-10-01 NOTE — Telephone Encounter (Signed)
I left voicemail for patient advising. 

## 2020-10-01 NOTE — Telephone Encounter (Signed)
Patient called he is requesting rx refill for methocarbamol. CB:[phone]

## 2020-10-03 ENCOUNTER — Ambulatory Visit: Payer: Medicare HMO | Admitting: Physical Therapy

## 2020-10-03 ENCOUNTER — Other Ambulatory Visit: Payer: Self-pay

## 2020-10-03 ENCOUNTER — Other Ambulatory Visit: Payer: Self-pay | Admitting: Physician Assistant

## 2020-10-03 ENCOUNTER — Encounter: Payer: Self-pay | Admitting: Physical Therapy

## 2020-10-03 ENCOUNTER — Telehealth: Payer: Self-pay | Admitting: Orthopaedic Surgery

## 2020-10-03 DIAGNOSIS — M25662 Stiffness of left knee, not elsewhere classified: Secondary | ICD-10-CM | POA: Diagnosis not present

## 2020-10-03 DIAGNOSIS — R2689 Other abnormalities of gait and mobility: Secondary | ICD-10-CM | POA: Diagnosis not present

## 2020-10-03 DIAGNOSIS — M25552 Pain in left hip: Secondary | ICD-10-CM | POA: Diagnosis not present

## 2020-10-03 DIAGNOSIS — M6281 Muscle weakness (generalized): Secondary | ICD-10-CM | POA: Diagnosis not present

## 2020-10-03 MED ORDER — OXYCODONE-ACETAMINOPHEN 5-325 MG PO TABS
1.0000 | ORAL_TABLET | Freq: Three times a day (TID) | ORAL | 0 refills | Status: DC | PRN
Start: 2020-10-03 — End: 2022-03-17

## 2020-10-03 NOTE — Patient Instructions (Signed)
Access Code: [email or web address] URL: [email or web address] Date: 10/03/2020 Prepared by: Ivery Quale  Exercises Seated Gastroc Stretch with Strap - 2 x daily - 6 x weekly - 2-3 sets - 30 hold Seated Long Arc Quad - 2 x daily - 6 x weekly - 2-3 sets - 10 reps - 3 hold Supine Hamstring Stretch with Strap - 2 x daily - 6 x weekly - 2-3 sets - 30 hold Supine Heel Slide with Strap - 2 x daily - 6 x weekly - 2 sets - 10 reps - 5 hold Standing March with Counter Support - 2 x daily - 6 x weekly - 1-2 sets - 10 reps Standing Hip Abduction with Counter Support - 2 x daily - 6 x weekly - 1-2 sets - 10 reps

## 2020-10-03 NOTE — Telephone Encounter (Signed)
Called and notified patient.

## 2020-10-03 NOTE — Telephone Encounter (Signed)
Pt called asking for a refill of his percocet's 5 please and would like to be notified when sent in.   [phone]
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
## 2020-10-03 NOTE — Telephone Encounter (Signed)
Sent in

## 2020-10-09 ENCOUNTER — Ambulatory Visit (INDEPENDENT_AMBULATORY_CARE_PROVIDER_SITE_OTHER): Payer: Medicare HMO | Admitting: Physician Assistant

## 2020-10-09 ENCOUNTER — Other Ambulatory Visit: Payer: Self-pay

## 2020-10-09 ENCOUNTER — Encounter: Payer: Self-pay | Admitting: Orthopaedic Surgery

## 2020-10-09 VITALS — Ht 64.0 in | Wt 117.0 lb

## 2020-10-09 DIAGNOSIS — Z96642 Presence of left artificial hip joint: Secondary | ICD-10-CM
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
## 2020-10-09 NOTE — Progress Notes (Signed)
Erroneous Encounter

## 2020-10-09 NOTE — Addendum Note (Signed)
Addended by: Barbette Or on: 10/09/2020 10:37 AM   Modules accepted: Orders

## 2020-10-10 ENCOUNTER — Telehealth: Payer: Self-pay | Admitting: Orthopaedic Surgery

## 2020-10-10 NOTE — Telephone Encounter (Signed)
Pls advise.  

## 2020-10-10 NOTE — Telephone Encounter (Signed)
Patient called asking if Dr. Roda Shutters wants him to continue taking his antibiotic medication from Dr. Roda Shutters and if so patient needs a refill. Please send to pharmacy on file or contact patient and let him know medication is no longer needed. Please send to pharmacy on file. Patient phone number is [phone].

## 2020-10-11 ENCOUNTER — Ambulatory Visit (HOSPITAL_COMMUNITY)
Admission: RE | Admit: 2020-10-11 | Discharge: 2020-10-11 | Disposition: A | Discharge status: Home / Self Care | Payer: Medicare HMO | Source: Ambulatory Visit | Attending: Orthopaedic Surgery | Admitting: Orthopaedic Surgery

## 2020-10-11 ENCOUNTER — Telehealth: Payer: Self-pay

## 2020-10-11 ENCOUNTER — Other Ambulatory Visit: Payer: Self-pay

## 2020-10-11 DIAGNOSIS — Z96642 Presence of left artificial hip joint: Secondary | ICD-10-CM | POA: Insufficient documentation

## 2020-10-11 DIAGNOSIS — M79605 Pain in left leg: Secondary | ICD-10-CM

## 2020-10-11 NOTE — Telephone Encounter (Signed)
Philip Bates with [facility] wanted to let Dr. Roda Shutters know that patient is Negative for DVT, left leg.  Stated that he did let the patient go.  If any questions please call [phone].  Thank you.

## 2020-10-11 NOTE — Telephone Encounter (Signed)
Ok thnx

## 2020-10-11 NOTE — Telephone Encounter (Signed)
No refill needed.

## 2020-10-11 NOTE — Telephone Encounter (Signed)
S/w patient and advised

## 2020-10-11 NOTE — Telephone Encounter (Signed)
IC advised.  

## 2020-10-16 ENCOUNTER — Encounter: Payer: Self-pay | Admitting: Physical Therapy

## 2020-10-16 ENCOUNTER — Other Ambulatory Visit: Payer: Self-pay

## 2020-10-16 ENCOUNTER — Ambulatory Visit: Payer: Medicare HMO | Admitting: Physical Therapy

## 2020-10-16 DIAGNOSIS — M6281 Muscle weakness (generalized): Secondary | ICD-10-CM | POA: Diagnosis not present

## 2020-10-16 DIAGNOSIS — M25662 Stiffness of left knee, not elsewhere classified: Secondary | ICD-10-CM | POA: Diagnosis not present

## 2020-10-16 DIAGNOSIS — M25552 Pain in left hip: Secondary | ICD-10-CM

## 2020-10-16 DIAGNOSIS — R2689 Other abnormalities of gait and mobility: Secondary | ICD-10-CM
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
## 2020-10-17 ENCOUNTER — Telehealth: Payer: Self-pay | Admitting: Orthopaedic Surgery

## 2020-10-17 NOTE — Telephone Encounter (Signed)
She will refax forms.  [phone]

## 2020-10-17 NOTE — Telephone Encounter (Signed)
Tammy, have you seen this come through?

## 2020-10-17 NOTE — Telephone Encounter (Signed)
No, I have nothing at all pending.

## 2020-10-17 NOTE — Telephone Encounter (Signed)
Philip Bates with [employer] called stating they faxed paperwork on 09/14/20 and hasn't received anything back; she would like to verify that we got these forms and if so she would like them signed and faxed back ASAP please.   CB# [phone] Fax# [phone]

## 2020-10-18 ENCOUNTER — Encounter: Payer: Medicare HMO | Admitting: Physical Therapy

## 2020-10-23 ENCOUNTER — Telehealth: Payer: Self-pay | Admitting: Rehabilitative and Restorative Service Providers"

## 2020-10-23 ENCOUNTER — Ambulatory Visit: Payer: Medicare HMO | Admitting: Orthopaedic Surgery

## 2020-10-23 ENCOUNTER — Encounter: Payer: Medicare HMO | Admitting: Rehabilitative and Restorative Service Providers"

## 2020-10-23 NOTE — Telephone Encounter (Signed)
Called and LVM about missed appointment c reminder of next appointment time.   Chyrel Masson, PT, DPT, OCS, ATC 10/23/20  3:41 PM

## 2020-10-25 ENCOUNTER — Other Ambulatory Visit: Payer: Self-pay

## 2020-10-25 ENCOUNTER — Ambulatory Visit: Payer: Medicare HMO | Admitting: Physical Therapy

## 2020-10-25 ENCOUNTER — Encounter: Payer: Self-pay | Admitting: Physical Therapy

## 2020-10-25 DIAGNOSIS — M6281 Muscle weakness (generalized): Secondary | ICD-10-CM

## 2020-10-25 DIAGNOSIS — M25662 Stiffness of left knee, not elsewhere classified: Secondary | ICD-10-CM | POA: Diagnosis not present

## 2020-10-25 DIAGNOSIS — R2689 Other abnormalities of gait and mobility: Secondary | ICD-10-CM

## 2020-10-25 DIAGNOSIS — M25552 Pain in left hip: Secondary | ICD-10-CM
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
## 2020-10-30 ENCOUNTER — Ambulatory Visit: Payer: Medicare HMO | Admitting: Physical Therapy

## 2020-10-30 ENCOUNTER — Other Ambulatory Visit: Payer: Self-pay

## 2020-10-30 DIAGNOSIS — R2689 Other abnormalities of gait and mobility: Secondary | ICD-10-CM

## 2020-10-30 DIAGNOSIS — M25662 Stiffness of left knee, not elsewhere classified: Secondary | ICD-10-CM | POA: Diagnosis not present

## 2020-10-30 DIAGNOSIS — M25552 Pain in left hip: Secondary | ICD-10-CM | POA: Diagnosis not present

## 2020-10-30 DIAGNOSIS — M6281 Muscle weakness (generalized): Secondary | ICD-10-CM
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
## 2020-11-01 ENCOUNTER — Other Ambulatory Visit: Payer: Self-pay

## 2020-11-01 ENCOUNTER — Ambulatory Visit: Payer: Medicare HMO | Admitting: Physical Therapy

## 2020-11-01 ENCOUNTER — Encounter: Payer: Self-pay | Admitting: Physical Therapy

## 2020-11-01 DIAGNOSIS — M25662 Stiffness of left knee, not elsewhere classified: Secondary | ICD-10-CM | POA: Diagnosis not present

## 2020-11-01 DIAGNOSIS — R2689 Other abnormalities of gait and mobility: Secondary | ICD-10-CM | POA: Diagnosis not present

## 2020-11-01 DIAGNOSIS — M25552 Pain in left hip: Secondary | ICD-10-CM

## 2020-11-01 DIAGNOSIS — M6281 Muscle weakness (generalized): Secondary | ICD-10-CM
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
## 2020-11-07 ENCOUNTER — Other Ambulatory Visit: Payer: Self-pay

## 2020-11-07 ENCOUNTER — Ambulatory Visit (INDEPENDENT_AMBULATORY_CARE_PROVIDER_SITE_OTHER): Payer: Medicare HMO | Admitting: Rehabilitative and Restorative Service Providers"

## 2020-11-07 ENCOUNTER — Ambulatory Visit: Payer: Medicare HMO | Admitting: Orthopedic Surgery

## 2020-11-07 DIAGNOSIS — M25552 Pain in left hip: Secondary | ICD-10-CM

## 2020-11-07 DIAGNOSIS — M25662 Stiffness of left knee, not elsewhere classified: Secondary | ICD-10-CM

## 2020-11-07 DIAGNOSIS — M6281 Muscle weakness (generalized): Secondary | ICD-10-CM | POA: Diagnosis not present

## 2020-11-07 DIAGNOSIS — R2689 Other abnormalities of gait and mobility: Secondary | ICD-10-CM | POA: Diagnosis not present
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
## 2020-11-09 ENCOUNTER — Ambulatory Visit: Payer: Medicare HMO | Admitting: Rehabilitative and Restorative Service Providers"

## 2020-11-09 ENCOUNTER — Other Ambulatory Visit: Payer: Self-pay

## 2020-11-09 ENCOUNTER — Encounter: Payer: Self-pay | Admitting: Rehabilitative and Restorative Service Providers"

## 2020-11-09 DIAGNOSIS — M25662 Stiffness of left knee, not elsewhere classified: Secondary | ICD-10-CM | POA: Diagnosis not present

## 2020-11-09 DIAGNOSIS — M6281 Muscle weakness (generalized): Secondary | ICD-10-CM

## 2020-11-09 DIAGNOSIS — M25552 Pain in left hip: Secondary | ICD-10-CM | POA: Diagnosis not present

## 2020-11-09 DIAGNOSIS — R2689 Other abnormalities of gait and mobility: Secondary | ICD-10-CM | POA: Diagnosis not present
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
## 2020-11-13 ENCOUNTER — Encounter: Payer: Self-pay | Admitting: Orthopaedic Surgery

## 2020-11-13 ENCOUNTER — Ambulatory Visit (INDEPENDENT_AMBULATORY_CARE_PROVIDER_SITE_OTHER): Payer: Medicare HMO | Admitting: Orthopaedic Surgery

## 2020-11-13 ENCOUNTER — Other Ambulatory Visit: Payer: Self-pay

## 2020-11-13 ENCOUNTER — Encounter: Payer: Self-pay | Admitting: Physical Therapy

## 2020-11-13 ENCOUNTER — Ambulatory Visit: Payer: Medicare HMO | Admitting: Physical Therapy

## 2020-11-13 ENCOUNTER — Ambulatory Visit (INDEPENDENT_AMBULATORY_CARE_PROVIDER_SITE_OTHER): Payer: Medicare HMO

## 2020-11-13 DIAGNOSIS — M25662 Stiffness of left knee, not elsewhere classified: Secondary | ICD-10-CM

## 2020-11-13 DIAGNOSIS — M25562 Pain in left knee: Secondary | ICD-10-CM | POA: Diagnosis not present

## 2020-11-13 DIAGNOSIS — M25552 Pain in left hip: Secondary | ICD-10-CM

## 2020-11-13 DIAGNOSIS — Z96642 Presence of left artificial hip joint: Secondary | ICD-10-CM

## 2020-11-13 DIAGNOSIS — G8929 Other chronic pain: Secondary | ICD-10-CM

## 2020-11-13 DIAGNOSIS — M6281 Muscle weakness (generalized): Secondary | ICD-10-CM | POA: Diagnosis not present

## 2020-11-13 DIAGNOSIS — R2689 Other abnormalities of gait and mobility: Secondary | ICD-10-CM

## 2020-11-13 MED ORDER — LIDOCAINE HCL 1 % IJ SOLN
2.0000 mL | INTRAMUSCULAR | Status: AC | PRN
Start: 2020-11-13 — End: 2020-11-13
  Administered 2020-11-13: 2 mL

## 2020-11-13 MED ORDER — METHYLPREDNISOLONE ACETATE 40 MG/ML IJ SUSP
40.0000 mg | INTRAMUSCULAR | Status: AC | PRN
Start: 2020-11-13 — End: 2020-11-13
  Administered 2020-11-13: 40 mg via INTRA_ARTICULAR

## 2020-11-13 MED ORDER — BUPIVACAINE HCL 0.5 % IJ SOLN
2.0000 mL | INTRAMUSCULAR | Status: AC | PRN
  Administered 2020-11-13: 2 mL via INTRA_ARTICULAR
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
## 2020-11-15 ENCOUNTER — Encounter: Payer: Medicare HMO | Admitting: Physical Therapy

## 2020-11-15 ENCOUNTER — Telehealth: Payer: Self-pay | Admitting: Physical Therapy

## 2020-11-15 ENCOUNTER — Telehealth: Payer: Self-pay | Admitting: Physical Medicine and Rehabilitation

## 2020-11-15 NOTE — Telephone Encounter (Signed)
Patient called needing to schedule an appointment with Dr Alvester Morin for NCS. The number to contact patient is [phone]

## 2020-11-15 NOTE — Telephone Encounter (Signed)
Pt called needing a referral for an NCS from [facility] to see [facility]. Pt might need a referral to neur due to [facility] next appt is on April 1.

## 2020-11-15 NOTE — Telephone Encounter (Signed)
Pt no show for PT appointment today. They were contacted and informed of this. Spoke with patient he states he had a conflict with another appointment. They were provided the date and time of their next appointment on voicemail. They were instructed to call us to let us know if they cannot make their appointment.

## 2020-11-16 NOTE — Telephone Encounter (Signed)
Philip Bates is appt made at neuro?

## 2020-11-20 ENCOUNTER — Encounter: Payer: Medicare HMO | Admitting: Physical Therapy

## 2020-11-21 ENCOUNTER — Encounter: Payer: Medicare HMO | Admitting: Neurology

## 2020-11-21 ENCOUNTER — Ambulatory Visit (INDEPENDENT_AMBULATORY_CARE_PROVIDER_SITE_OTHER): Payer: Medicare HMO | Admitting: Neurology

## 2020-11-21 DIAGNOSIS — R29898 Other symptoms and signs involving the musculoskeletal system: Secondary | ICD-10-CM

## 2020-11-21 DIAGNOSIS — Z0289 Encounter for other administrative examinations: Secondary | ICD-10-CM

## 2020-11-21 NOTE — Procedures (Signed)
Full Name: Philip Bates Gender: Male MRN #: [id number] Date of Birth: January 19, 1961    Visit Date: 11/21/2020 08:32 Age: 60 Years Examining Physician: Levert Feinstein, MD  Referring Physician: Gershon Mussel, MD History: 60 year old male, with history of cerebral palsy, hypoplasia of left lower extremity, status post left hip replacement in December 2021, complains of left foot numbness extending to left lateral leg, calf, profound weakness of left ankle since he had a history of left knee surgery in the past.  On examination: Limited range of motion of right shoulder due to pain, bilateral upper extremity with normal muscle tone, strengths. Hypoplasia of left lower extremity, limited range of left knee, maximum extension 160 degree, well-healed prolonged scar along the lateral knee. Left hip flexion 4+, left hip adduction 4+, hip abduction 4, knee flexion 5, knee extension 5, ankle dorsiflexion 0, eversion 0, plantarflexion 3, inversion 3. Right lower extremity is well-developed, with normal muscle tone and strength. Sensory examination showed decreased light touch pinprick at the left lower extremity involving top and plantar surface of left foot, extending to left lateral leg, also at the left calf area. Bilateral patellar reflexes were present, absent left ankle reflex  Summary of the test: Nerve conduction study: Right sural, superficial peroneal sensory responses were normal. Left sural, superficial peroneal sensory responses were absent. Right tibial motor responses were normal. Left tibial motor response showed moderate to severe decreased CMAP amplitude. Right peroneal to EDB motor response showed mildly decreased CMAP amplitude. Left peroneal to EDB motor response was absent.  Electromyography: Selected needle examination was performed at the left lower extremity muscles, left lumbosacral paraspinal muscles, and also right lower extremity muscles for comparison.  The most profound  abnormality is noted at left tibialis anterior, peroneal longus, short head of biceps femoris, and milder active neuropathic changes at left tibialis posterior, medial gastrocnemius.  There was performed spontaneous activity at left tibialis anterior, peroneal longus, with no evidence of  voluntary motor units recruitment. There was moderate spontaneous activity at short head of biceps femoris, with enlarged complex motor unit potential and decreased recruitment patterns. There was mild to moderate spontaneous activity at left tibialis posterior, medial gastrocnemius, mixture of normal and some enlarged motor unit potential was decreased recruitment patterns.  There was no significant abnormality at the rest of the muscle tested, there was no spontaneous activity at left lumbosacral paraspinals.  Conclusion: This is an abnormal study. 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 There is no evidence of left lumbosacral radiculopathy, or right lower extremity neuropathy.    ------------------------------- Levert Feinstein, M.D. PhD  [facility] [address] [place], [place] [address] Tel: [phone] Fax: [phone]  Verbal informed consent was obtained from the patient, patient was informed of potential risk of procedure, including bruising, bleeding, hematoma formation, infection, muscle weakness, muscle pain, numbness, among others.        MNC    Nerve / Sites Muscle Latency Ref. Amplitude Ref. Rel Amp Segments Distance Velocity Ref. Area    ms ms mV mV %  cm m/s m/s mVms  L Peroneal - EDB     Ankle EDB NR ?6.5 NR ?2.0 NR Ankle - EDB 9   NR     Fib head EDB NR  NR  NR Fib head - Ankle 26 NR ?44 NR     Pop fossa EDB NR  NR  NR Pop fossa -  Fib head 10 NR ?44 NR         Pop fossa - Ankle      R Peroneal - EDB     Ankle EDB 4.1 ?6.5 1.5 ?2.0 100 Ankle - EDB 9   3.1     Fib head  EDB 10.0  1.5  103 Fib head - Ankle 28 47 ?44 3.2     Pop fossa EDB 12.1  1.4  91.5 Pop fossa - Fib head 10 47 ?44 3.0         Pop fossa - Ankle      L Tibial - AH     Ankle AH 5.8 ?5.8 1.6 ?4.0 100 Ankle - AH 9   4.1     Pop fossa AH 14.9  1.5  93.4 Pop fossa - Ankle 37 41 ?41 2.9  R Tibial - AH     Ankle AH 5.1 ?5.8 7.1 ?4.0 100 Ankle - AH 9   16.8     Pop fossa AH 13.2  5.6  78.3 Pop fossa - Ankle 38 47 ?41 14.1             SNC    Nerve / Sites Rec. Site Peak Lat Ref.  Amp Ref. Segments Distance    ms ms V V  cm  L Sural - Ankle (Calf)     Calf Ankle NR ?4.4 NR ?6 Calf - Ankle 14  R Sural - Ankle (Calf)     Calf Ankle 3.2 ?4.4 7 ?6 Calf - Ankle 14  L Superficial peroneal - Ankle     Lat leg Ankle NR ?4.4 NR ?6 Lat leg - Ankle 14  R Superficial peroneal - Ankle     Lat leg Ankle 3.6 ?4.4 5 ?6 Lat leg - Ankle 14             F  Wave    Nerve F Lat Ref.   ms ms  L Tibial - AH 62.4 ?56.0  R Tibial - AH 53.5 ?56.0         EMG Summary Table    Spontaneous MUAP Recruitment  Muscle IA Fib PSW Fasc Other Amp Dur. Poly Pattern  L. Tibialis anterior Increased 3+ 3+ None _______ Normal Normal Normal No Activity  L. Tibialis posterior Increased 1+ 1+ None _______ Normal Normal 1+ Reduced  L. Peroneus longus Increased 3+ 3+ None _______ Normal Normal Normal No Activity  L. Vastus lateralis Normal None None None _______ Normal Normal Normal Normal  L. Biceps femoris (short head) Increased 2+ 2+ None _______ Increased Increased 1+ Reduced  L. Biceps femoris (long head) Increased None None None _______ Normal Normal Normal Reduced  L. Lumbar paraspinals (mid) Normal None None None _______ Normal Normal Normal Normal  L. Lumbar paraspinals (low) Normal None None None _______ Normal Normal Normal Normal  R. Tibialis anterior Normal None None None _______ Normal Normal Normal Normal  R. Tibialis posterior Normal None None None _______ Normal Normal Normal Normal  R. Peroneus longus Normal  None None None _______ Normal Normal Normal Normal  R. Gastrocnemius (Medial head) Normal None None None _______ Normal Normal Normal Normal  R. Vastus lateralis Normal None None None _______ Normal Normal Normal Normal  L. Gluteus medius Normal None None None _______ Normal Normal Normal Normal  L. Adductor longus Normal None None None _______ Normal Normal Normal Normal

## 2020-11-23 ENCOUNTER — Encounter: Payer: Medicare HMO | Admitting: Physical Therapy

## 2020-11-23 ENCOUNTER — Telehealth: Payer: Self-pay | Admitting: Physical Therapy

## 2020-11-23 NOTE — Telephone Encounter (Signed)
Pt no show for PT appointment today. They were contacted and informed of this via voicemail. They were provided the date and time of their next appointment with MD on voicemail. They do not have any more PT visits scheduled so They were instructed to call us back to get on the schedule if they feel they want to continue with PT.  Ivery Quale, PT, DPT 11/23/20 2:57 PM

## 2020-11-27 ENCOUNTER — Ambulatory Visit (INDEPENDENT_AMBULATORY_CARE_PROVIDER_SITE_OTHER): Payer: Medicare HMO | Admitting: Orthopaedic Surgery

## 2020-11-27 ENCOUNTER — Encounter: Payer: Self-pay | Admitting: Orthopaedic Surgery

## 2020-11-27 DIAGNOSIS — G5702 Lesion of sciatic nerve, left lower limb: Secondary | ICD-10-CM

## 2020-11-27 DIAGNOSIS — Z96642 Presence of left artificial hip joint: Secondary | ICD-10-CM

## 2020-11-27 NOTE — Addendum Note (Signed)
Addended by: Albertina Parr on: 11/27/2020 04:35 PM   Modules accepted: Orders
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
## 2020-12-06 ENCOUNTER — Telehealth: Payer: Self-pay | Admitting: Orthopaedic Surgery

## 2020-12-06 ENCOUNTER — Other Ambulatory Visit: Payer: Self-pay | Admitting: Family Medicine

## 2020-12-06 NOTE — Telephone Encounter (Signed)
Unfortunately it is too far out from surgery for narcotics.  I am happy to call in tramadol if no hx of seizures.  Does he see pain management?

## 2020-12-06 NOTE — Telephone Encounter (Signed)
Patient called requesting a refill of oxycodone. Patient states he is in pain and would like to start taking his pain medication again. Please send to pharmacy on file and call patient when it has been sent to pharmacy. Patient phone number is [phone].

## 2020-12-07 NOTE — Telephone Encounter (Signed)
Goes to pain clinic. States he has medication already.

## 2020-12-13 ENCOUNTER — Telehealth: Payer: Self-pay | Admitting: Family Medicine

## 2020-12-13 NOTE — Telephone Encounter (Signed)
Left message for patient to call back and schedule Medicare Annual Wellness Visit (AWV) either virtually or in office. No detailed message   AWVI  please schedule at anytime with [facility] Nurse Health Advisor 1 or 2   This should be a 45 minute visit. 

## 2021-01-01 ENCOUNTER — Ambulatory Visit: Payer: Medicare HMO

## 2021-01-04 DIAGNOSIS — R5383 Other fatigue: Secondary | ICD-10-CM | POA: Insufficient documentation

## 2021-01-24 ENCOUNTER — Telehealth: Payer: Self-pay | Admitting: Orthopaedic Surgery

## 2021-01-24 NOTE — Telephone Encounter (Signed)
Pt called stating he had a nerve decompression done and he is wanting to know if he can have a MMES machine sent to him. He would like a CB to further discuss.  [phone]

## 2021-01-24 NOTE — Telephone Encounter (Signed)
I am not familiar with this machine.  Perhaps he should request this from Dr. Dierdre Searles who did the surgery.

## 2021-01-25 NOTE — Telephone Encounter (Signed)
LMOM for patient of the below message  

## 2021-02-14 ENCOUNTER — Telehealth: Payer: Self-pay | Admitting: Family Medicine

## 2021-02-14 NOTE — Telephone Encounter (Signed)
Tried calling patient to  schedule Medicare Annual Wellness Visit (AWV) either virtually or in office.  No answer could not leave message    awvi 03/06/16 per [place]  please schedule at anytime with [facility] Nurse Health Advisor 1 or 2   This should be a 45 minute visit.

## 2021-02-18 ENCOUNTER — Other Ambulatory Visit: Payer: Self-pay | Admitting: Family Medicine

## 2021-02-18 ENCOUNTER — Other Ambulatory Visit: Payer: Self-pay | Admitting: Orthopaedic Surgery

## 2021-02-19 ENCOUNTER — Other Ambulatory Visit (HOSPITAL_COMMUNITY): Payer: Self-pay

## 2021-02-19 NOTE — Telephone Encounter (Signed)
Cannot refill this.  Too harsh on kidneys

## 2021-02-22 ENCOUNTER — Other Ambulatory Visit: Payer: Self-pay

## 2021-02-22 ENCOUNTER — Encounter: Payer: Self-pay | Admitting: Orthopaedic Surgery

## 2021-02-22 ENCOUNTER — Ambulatory Visit (INDEPENDENT_AMBULATORY_CARE_PROVIDER_SITE_OTHER): Payer: Medicare HMO

## 2021-02-22 ENCOUNTER — Ambulatory Visit (INDEPENDENT_AMBULATORY_CARE_PROVIDER_SITE_OTHER): Payer: Medicare HMO | Admitting: Orthopaedic Surgery

## 2021-02-22 VITALS — Ht 64.0 in | Wt 117.0 lb

## 2021-02-22 DIAGNOSIS — Z96642 Presence of left artificial hip joint: Secondary | ICD-10-CM | POA: Diagnosis not present

## 2021-02-22 MED ORDER — OXYCODONE-ACETAMINOPHEN 10-325 MG PO TABS: 1.0000 | ORAL_TABLET | Freq: Three times a day (TID) | ORAL | 0 refills | Status: AC | PRN
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
## 2021-03-01 ENCOUNTER — Encounter: Payer: Self-pay | Admitting: Physical Therapy

## 2021-03-01 ENCOUNTER — Ambulatory Visit: Payer: Medicare HMO | Admitting: Physical Therapy

## 2021-03-01 ENCOUNTER — Other Ambulatory Visit: Payer: Self-pay

## 2021-03-01 DIAGNOSIS — M25662 Stiffness of left knee, not elsewhere classified: Secondary | ICD-10-CM | POA: Diagnosis not present

## 2021-03-01 DIAGNOSIS — M25552 Pain in left hip: Secondary | ICD-10-CM

## 2021-03-01 DIAGNOSIS — M6281 Muscle weakness (generalized): Secondary | ICD-10-CM

## 2021-03-01 DIAGNOSIS — R2689 Other abnormalities of gait and mobility: Secondary | ICD-10-CM

## 2021-03-01 DIAGNOSIS — M25572 Pain in left ankle and joints of left foot: Secondary | ICD-10-CM
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
## 2021-03-01 NOTE — Patient Instructions (Signed)
Access Code: [email or web address] URL: [email or web address] Date: 03/01/2021 Prepared by: Ivery Quale  Exercises Seated Gastroc Stretch with Strap - 2 x daily - 6 x weekly - 2-3 sets - 30 hold Supine Hamstring Stretch with Strap - 2 x daily - 6 x weekly - 2-3 sets - 30 hold Seated Straight Leg Heel Taps - 2 x daily - 6 x weekly - 2-3 sets - 10 reps Standing March with Counter Support - 2 x daily - 6 x weekly - 1-2 sets - 10 reps Standing Hip Abduction with Counter Support - 2 x daily - 6 x weekly - 1-2 sets - 10 reps Side Stepping with Counter Support - 2 x daily - 6 x weekly - 1 sets - 3-5 reps Mini Squat with Counter Support - 2 x daily - 6 x weekly - 1-2 sets - 10 reps Supine Single Leg Ankle Pumps - 2 x daily - 6 x weekly - 1-2 sets - 10 reps

## 2021-03-06 ENCOUNTER — Ambulatory Visit (INDEPENDENT_AMBULATORY_CARE_PROVIDER_SITE_OTHER): Payer: Medicare HMO | Admitting: Rehabilitative and Restorative Service Providers"

## 2021-03-06 ENCOUNTER — Encounter: Payer: Self-pay | Admitting: Rehabilitative and Restorative Service Providers"

## 2021-03-06 ENCOUNTER — Other Ambulatory Visit: Payer: Self-pay

## 2021-03-06 DIAGNOSIS — M25652 Stiffness of left hip, not elsewhere classified: Secondary | ICD-10-CM | POA: Diagnosis not present

## 2021-03-06 DIAGNOSIS — M25662 Stiffness of left knee, not elsewhere classified: Secondary | ICD-10-CM

## 2021-03-06 DIAGNOSIS — M25552 Pain in left hip: Secondary | ICD-10-CM

## 2021-03-06 DIAGNOSIS — R262 Difficulty in walking, not elsewhere classified: Secondary | ICD-10-CM

## 2021-03-06 DIAGNOSIS — M6281 Muscle weakness (generalized): Secondary | ICD-10-CM | POA: Diagnosis not present
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
## 2021-03-08 ENCOUNTER — Other Ambulatory Visit: Payer: Self-pay

## 2021-03-08 ENCOUNTER — Encounter: Payer: Self-pay | Admitting: Rehabilitative and Restorative Service Providers"

## 2021-03-08 ENCOUNTER — Ambulatory Visit (INDEPENDENT_AMBULATORY_CARE_PROVIDER_SITE_OTHER): Payer: Medicare HMO | Admitting: Rehabilitative and Restorative Service Providers"

## 2021-03-08 DIAGNOSIS — M6281 Muscle weakness (generalized): Secondary | ICD-10-CM

## 2021-03-08 DIAGNOSIS — M25662 Stiffness of left knee, not elsewhere classified: Secondary | ICD-10-CM

## 2021-03-08 DIAGNOSIS — M25552 Pain in left hip: Secondary | ICD-10-CM

## 2021-03-08 DIAGNOSIS — M25572 Pain in left ankle and joints of left foot: Secondary | ICD-10-CM

## 2021-03-08 DIAGNOSIS — R262 Difficulty in walking, not elsewhere classified: Secondary | ICD-10-CM | POA: Diagnosis not present

## 2021-03-08 DIAGNOSIS — M25652 Stiffness of left hip, not elsewhere classified: Secondary | ICD-10-CM | POA: Diagnosis not present
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
## 2021-03-19 ENCOUNTER — Telehealth: Payer: Self-pay | Admitting: Physical Therapy

## 2021-03-19 ENCOUNTER — Encounter: Payer: Medicare HMO | Admitting: Physical Therapy

## 2021-03-19 NOTE — Telephone Encounter (Signed)
Pt no show for PT appointment today. They were contacted and informed of this via voicemail. They were provided the date and time of their next appointment on voicemail. They were instructed to call us to let us know if they cannot make their appointment.  Ivery Quale, PT, DPT 03/19/21 1:20 PM

## 2021-03-21 ENCOUNTER — Encounter: Payer: Medicare HMO | Admitting: Physical Therapy

## 2021-03-21 ENCOUNTER — Telehealth: Payer: Self-pay | Admitting: Physical Therapy

## 2021-03-21 NOTE — Telephone Encounter (Signed)
Pt no show for PT appointment today. They were contacted and informed of this via voicemail. They were provided the date and time of their next appointment on voicemail. They were instructed to call us to let us know if they cannot make their appointment.  Ivery Quale, PT, DPT 03/21/21 3:44 PM

## 2021-03-26 ENCOUNTER — Telehealth: Payer: Self-pay | Admitting: Physical Therapy

## 2021-03-26 ENCOUNTER — Encounter: Payer: Medicare HMO | Admitting: Physical Therapy

## 2021-03-26 NOTE — Telephone Encounter (Signed)
Pt no show for PT appointment today. They were contacted and informed of this and that he has now missed his last 3 appointments. He states he still needs PT and that he will be at his next appointment on 6/23. If he does not attend we will have to cancel his remaining visits per attendance policy.   Ivery Quale, PT, DPT 03/26/21 1:22 PM

## 2021-03-28 ENCOUNTER — Telehealth: Payer: Self-pay | Admitting: Orthopaedic Surgery

## 2021-03-28 ENCOUNTER — Encounter: Payer: Medicare HMO | Admitting: Physical Therapy

## 2021-03-28 NOTE — Telephone Encounter (Signed)
Pt is wanting to know if Dr.Xu will send a referral to [facility] for his knee pain?   CB [phone]  [facility] Fax: [phone]

## 2021-03-28 NOTE — Telephone Encounter (Signed)
Sure.  Any doctor in particular that he wants to see?

## 2021-03-29 ENCOUNTER — Other Ambulatory Visit: Payer: Self-pay

## 2021-03-29 DIAGNOSIS — G8929 Other chronic pain: Secondary | ICD-10-CM

## 2021-03-29 DIAGNOSIS — M25562 Pain in left knee: Secondary | ICD-10-CM

## 2021-03-29 NOTE — Telephone Encounter (Signed)
ORDER MADE 

## 2021-04-02 ENCOUNTER — Encounter: Payer: Medicare HMO | Admitting: Rehabilitative and Restorative Service Providers"

## 2021-04-04 ENCOUNTER — Encounter: Payer: Medicare HMO | Admitting: Physical Therapy

## 2021-04-04 ENCOUNTER — Ambulatory Visit: Payer: Medicare HMO

## 2021-04-09 ENCOUNTER — Encounter: Payer: Medicare HMO | Admitting: Physical Therapy

## 2021-04-09 ENCOUNTER — Other Ambulatory Visit: Payer: Self-pay

## 2021-04-09 ENCOUNTER — Ambulatory Visit (INDEPENDENT_AMBULATORY_CARE_PROVIDER_SITE_OTHER): Payer: Medicare HMO

## 2021-04-09 VITALS — BP 126/78 | HR 87 | Temp 98.2°F | Wt 115.4 lb

## 2021-04-09 DIAGNOSIS — Z560 Unemployment, unspecified: Secondary | ICD-10-CM | POA: Diagnosis not present

## 2021-04-09 DIAGNOSIS — Z0000 Encounter for general adult medical examination without abnormal findings: Secondary | ICD-10-CM | POA: Diagnosis not present

## 2021-04-09 NOTE — Progress Notes (Addendum)
Subjective:   Philip Bates is a 60 y.o. male who presents for an Initial Medicare Annual Wellness Visit.  Review of Systems     Cardiac Risk Factors include: advanced age (>23mn, >>60women);male gender     Objective:    Today's Vitals   04/09/21 1506  BP: 126/78  Pulse: 87  Temp: 98.2 F (36.8 C)  SpO2: 98%  Weight: 115 lb 6.4 oz (52.3 kg)  PainSc: 2    Body mass index is 19.81 kg/m.  Advanced Directives 04/09/2021 03/01/2021 09/06/2020 08/13/2020 08/12/2018 08/06/2018 01/26/2018  Does Patient Have a Medical Advance Directive? Yes No No No Yes Yes No  Type of Advance Directive - - - - HSpecial educational needs teacherof ALaceyLiving will -  Does patient want to make changes to medical advance directive? - - - - No - Patient declined No - Patient declined -  Copy of HAmity Gardensin Chart? - - - - No - copy requested No - copy requested -  Would patient like information on creating a medical advance directive? Yes (MAU/Ambulatory/Procedural Areas - Information given) No - Patient declined No - Patient declined - - - Yes (MAU/Ambulatory/Procedural Areas - Information given)    Current Medications (verified) Outpatient Encounter Medications as of 04/09/2021  Medication Sig   aspirin EC 81 MG tablet Take 1 tablet (81 mg total) by mouth 2 (two) times daily.   bismuth subsalicylate (PEPTO BISMOL) 262 MG/15ML suspension Take 30 mLs by mouth every 6 (six) hours as needed for indigestion.   docusate sodium (COLACE) 100 MG capsule Take 1 capsule (100 mg total) by mouth daily as needed.   ergocalciferol (VITAMIN D2) 1.25 MG (50000 UT) capsule 1 (ONE) CAPSULE CAPSULE BY MOUTH ONCE A WEEK   gabapentin (NEURONTIN) 300 MG capsule Take 1 capsule (300 mg total) by mouth 3 (three) times daily.   Multiple Vitamin (MULTIVITAMIN WITH MINERALS) TABS tablet Take 1 tablet by mouth 3 (three) times a week.   ondansetron (ZOFRAN) 4 MG tablet Take 1 tablet (4 mg total) by mouth  every 8 (eight) hours as needed for nausea or vomiting.   oxyCODONE-acetaminophen (PERCOCET) 5-325 MG tablet Take 1-2 tablets by mouth every 8 (eight) hours as needed for severe pain. (Patient taking differently: Take 10-325 tablets by mouth every 8 (eight) hours as needed for severe pain.)   zolpidem (AMBIEN) 10 MG tablet Take 10 mg by mouth at bedtime as needed for sleep.   cetirizine (ZYRTEC) 10 MG tablet Take 10 mg by mouth daily as needed for allergies. (Patient not taking: Reported on 04/09/2021)   NARCAN 4 MG/0.1ML LIQD nasal spray kit Place 1 spray into the nose as needed (opioid overdose).  (Patient not taking: Reported on 04/09/2021)   [DISCONTINUED] acetaminophen (TYLENOL) 650 MG CR tablet Take 650 mg by mouth every 8 (eight) hours as needed for pain. (Patient not taking: Reported on 04/09/2021)   [DISCONTINUED] cephALEXin (KEFLEX) 500 MG capsule Take 1 capsule (500 mg total) by mouth 4 (four) times daily. (Patient not taking: Reported on 04/09/2021)   [DISCONTINUED] HYDROmorphone (DILAUDID) 2 MG tablet Take 1 tablet (2 mg total) by mouth every 6 (six) hours as needed for severe pain. (Patient not taking: Reported on 04/09/2021)   [DISCONTINUED] ketorolac (TORADOL) 10 MG tablet Take 1 tablet (10 mg total) by mouth 2 (two) times daily as needed. (Patient not taking: Reported on 04/09/2021)   [DISCONTINUED] methocarbamol (ROBAXIN) 500 MG tablet Take 1 tablet (500 mg  total) by mouth 2 (two) times daily as needed for muscle spasms. (Patient not taking: Reported on 04/09/2021)   [DISCONTINUED] sulfamethoxazole-trimethoprim (BACTRIM DS) 800-160 MG tablet Take 1 tablet by mouth 2 (two) times daily. (Patient not taking: Reported on 04/09/2021)   No facility-administered encounter medications on file as of 04/09/2021.    Allergies (verified) Naproxen and Flexeril [cyclobenzaprine]   History: Past Medical History:  Diagnosis Date   Arthritis    Cerebral palsy ([place])    Cervical radiculopathy    H/O umbilical  hernia repair 2001   Pre-diabetes    Scoliosis    Spondylosis of cervical spine    Past Surgical History:  Procedure Laterality Date   ANTERIOR CERVICAL DECOMPRESSION/DISCECTOMY FUSION 4 LEVELS N/A 08/11/2017   Procedure: Cervical three to Cervical seven Anterior Cervical discectomy with fusion/plate fixation;  Surgeon: Ditty, Kevan Ny, MD;  Location: [facility];  Service: Neurosurgery;  Laterality: N/A;   BACK SURGERY     HERNIA REPAIR     inguinal hernia repair in early 2000   LEG SURGERY     POSTERIOR CERVICAL FUSION/FORAMINOTOMY N/A 08/12/2018   Procedure: POSTERIOR CERVICAL FUSION WITH LATERAL MASS FIXATION CERVICAL THREE TO CERVICAL SEVEN;  Surgeon: Consuella Lose, MD;  Location: [facility];  Service: Neurosurgery;  Laterality: N/A;   SHOULDER ARTHROSCOPY WITH ROTATOR CUFF REPAIR AND SUBACROMIAL DECOMPRESSION Right 02/02/2018   Procedure: RIGHT SHOULDER ARTHROSCOPY WITH SUBACROMIAL DECOMPRESSION, MINI-OPEN ROTATOR CUFF REPAIR;  Surgeon: Meredith Pel, MD;  Location: [facility];  Service: Orthopedics;  Laterality: Right;   SHOULDER SURGERY Right    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   Procedure: LEFT TOTAL HIP ARTHROPLASTY ANTERIOR APPROACH;  Surgeon: Leandrew Koyanagi, MD;  Location: [facility];  Service: Orthopedics;  Laterality: Left;   Family History  Problem Relation Age of Onset   Cancer Father        esophagus   Diabetes Brother    Diabetes Paternal Uncle    Social History   Socioeconomic History   Marital status: Married    Spouse name: Not on file   Number of children: Not on file   Years of education: Not on file   Highest education level: Not on file  Occupational History   Not on file  Tobacco Use   Smoking status: Every Day    Packs/day: 1.00    Years: 35.00    Pack years: 35.00    Types: Cigarettes   Smokeless tobacco: Never  Vaping Use   Vaping Use: Never used  Substance and Sexual Activity   Alcohol use: Never   Drug use: Never   Sexual activity: Yes   Other Topics Concern   Not on file  Social History Narrative   ** Merged History Encounter **       Social Determinants of Health   Financial Resource Strain: Medium Risk   Difficulty of Paying Living Expenses: Somewhat hard  Food Insecurity: No Food Insecurity   Worried About Charity fundraiser in the Last Year: Never true   Ran Out of Food in the Last Year: Never true  Transportation Needs: No Transportation Needs   Lack of Transportation (Medical): No   Lack of Transportation (Non-Medical): No  Physical Activity: Insufficiently Active   Days of Exercise per Week: 3 days   Minutes of Exercise per Session: 30 min  Stress: Stress Concern Present   Feeling of Stress : To some extent  Social Connections: Moderately Isolated   Frequency of Communication with Friends and  Family: More than three times a week   Frequency of Social Gatherings with Friends and Family: Once a week   Attends Religious Services: Never   Marine scientist or Organizations: No   Attends Music therapist: Never   Marital Status: Married    Tobacco Counseling Ready to quit: Not Answered Counseling given: Not Answered   Clinical Intake:  Pre-visit preparation completed: Yes  Pain : 0-10 Pain Score: 2  Pain Location: Hip Pain Orientation: Left Pain Descriptors / Indicators: Aching, Sharp Pain Onset: More than a month ago Pain Frequency: Intermittent     BMI - recorded: 19.81 Nutritional Status: BMI of 19-24  Normal Nutritional Risks: None Diabetes: No  How often do you need to have someone help you when you read instructions, pamphlets, or other written materials from your doctor or pharmacy?: 1 - Never  Diabetic?No  Interpreter Needed?: No  Information entered by :: Charlott Rakes, LPN   Activities of Daily Living In your present state of health, do you have any difficulty performing the following activities: 04/09/2021 09/06/2020  Hearing? N N  Vision? N N   Difficulty concentrating or making decisions? N N  Walking or climbing stairs? N Y  Dressing or bathing? N N  Doing errands, shopping? N N  Preparing Food and eating ? N -  Using the Toilet? N -  In the past six months, have you accidently leaked urine? N -  Do you have problems with loss of bowel control? N -  Managing your Medications? N -  Managing your Finances? N -  Housekeeping or managing your Housekeeping? N -  Some recent data might be hidden    Patient Care Team: Martinique, Betty G, MD as PCP - General (Family Medicine) [place], Betty G, MD (Family Medicine) Reinaldo Raddle, RN as Case Manager Leandrew Koyanagi, MD as Attending Physician (Orthopedic Surgery)  Indicate any recent Medical Services you may have received from other than [facility] providers in the past year (date may be approximate).     Assessment:   This is a routine wellness examination for Bhavik.  Hearing/Vision screen Hearing Screening - Comments:: Pt denies any hearing issues  Vision Screening - Comments:: Pt will follow up with [facility] for annual [facility] exams  Dietary issues and exercise activities discussed: Current Exercise Habits: Home exercise routine, Type of exercise: Other - see comments (ymca and walking), Time (Minutes): 30, Frequency (Times/Week): 3, Weekly Exercise (Minutes/Week): 90   Goals Addressed             This Visit's Progress    Patient Stated       Go back to school        Depression Screen PHQ 2/9 Scores 04/09/2021 06/15/2020  PHQ - 2 Score 1 0    Fall Risk Fall Risk  04/09/2021  Falls in the past year? 1  Number falls in past yr: 1  Injury with Fall? 1  Risk for fall due to : Impaired mobility;Impaired balance/gait;Impaired vision  Follow up Falls prevention discussed    FALL RISK PREVENTION PERTAINING TO THE HOME:  Any stairs in or around the home? Yes  If so, are there any without handrails? Yes  Home free of loose throw rugs in walkways, pet beds, electrical  cords, etc? Yes  Adequate lighting in your home to reduce risk of falls? Yes   ASSISTIVE DEVICES UTILIZED TO PREVENT FALLS:  Life alert? No  Use of a cane, walker or  w/c? Yes  Grab bars in the bathroom? Yes  Shower chair or bench in shower? No  Elevated toilet seat or a handicapped toilet? Yes   TIMED UP AND GO:  Was the test performed? Yes .  Length of time to ambulate 10 feet: 15 sec.   Gait slow and steady with assistive device  Cognitive Function:     6CIT Screen 04/09/2021  What Year? 0 points  What month? 0 points  What time? 0 points  Count back from 20 0 points  Months in reverse 0 points  Repeat phrase 0 points  Total Score 0    Immunizations Immunization History  Administered Date(s) Administered   Influenza,inj,Quad PF,6+ Mos 06/01/2017   PFIZER Comirnaty(Gray Top)Covid-19 Tri-Sucrose Vaccine 12/30/2019, 01/21/2020, 07/25/2020   Tdap 08/14/2020    TDAP status: Up to date  Flu Vaccine status: Due, Education has been provided regarding the importance of this vaccine. Advised may receive this vaccine at local pharmacy or Health Dept. Aware to provide a copy of the vaccination record if obtained from local pharmacy or Health Dept. Verbalized acceptance and understanding.  Pneumococcal vaccine status: Due, Education has been provided regarding the importance of this vaccine. Advised may receive this vaccine at local pharmacy or Health Dept. Aware to provide a copy of the vaccination record if obtained from local pharmacy or Health Dept. Verbalized acceptance and understanding.  Covid-19 vaccine status: Completed vaccines  Qualifies for Shingles Vaccine? Yes   Zostavax completed No   Shingrix Completed?: No.    Education has been provided regarding the importance of this vaccine. Patient has been advised to call insurance company to determine out of pocket expense if they have not yet received this vaccine. Advised may also receive vaccine at local pharmacy or  Health Dept. Verbalized acceptance and understanding.  Screening Tests Health Maintenance  Topic Date Due   Pneumococcal Vaccine 52-37 Years old (1 - PCV) Never done   Hepatitis C Screening  Never done   Zoster Vaccines- Shingrix (1 of 2) Never done   COLONOSCOPY (Pts 45-36yr Insurance coverage will need to be confirmed)  Never done   COVID-19 Vaccine (4 - Booster for PCoca-Colaseries) 10/25/2020   INFLUENZA VACCINE  05/06/2021   TETANUS/TDAP  08/14/2030   HIV Screening  Completed   HPV VACCINES  Aged Out    Health Maintenance  Health Maintenance Due  Topic Date Due   Pneumococcal Vaccine 077621Years old (1 - PCV) Never done   Hepatitis C Screening  Never done   Zoster Vaccines- Shingrix (1 of 2) Never done   COLONOSCOPY (Pts 45-431yrInsurance coverage will need to be confirmed)  Never done   COVID-19 Vaccine (4 - Booster for PfAbiquiueries) 10/25/2020    Colorectal cancer screening pt stated [facility] will sign release for records     Additional Screening:  Hepatitis C Screening: does qualify;   Vision Screening: Recommended annual ophthalmology exams for early detection of glaucoma and other disorders of the eye. Is the patient up to date with their annual eye exam?  Yes  Who is the provider or what is the name of the office in which the patient attends annual eye exams? [facility]  If pt is not established with a provider, would they like to be referred to a provider to establish care? No .   Dental Screening: Recommended annual dental exams for proper oral hygiene  Community Resource Referral / Chronic Care Management: CRR required this visit?  No   CCM required this visit?  No      Plan:     I have personally reviewed and noted the following in the patient's chart:   Medical and social history Use of alcohol, tobacco or illicit drugs  Current medications and supplements including opioid prescriptions. Patient is currently taking opioid  prescriptions. Information provided to patient regarding non-opioid alternatives. Patient advised to discuss non-opioid treatment plan with their provider. Functional ability and status Nutritional status Physical activity Advanced directives List of other physicians Hospitalizations, surgeries, and ER visits in previous 12 months Vitals Screenings to include cognitive, depression, and falls Referrals and appointments  In addition, I have reviewed and discussed with patient certain preventive protocols, quality metrics, and best practice recommendations. A written personalized care plan for preventive services as well as general preventive health recommendations were provided to patient.     Willette Brace, LPN   1/0/0349   Nurse Notes: Pt has requested medication for erectile dysfunction stating he has been having problems getting an erection. Please advise Pt is also dealing with depression related to his inability to find a job comparable to his disability, referral sent to [facility] have been sent

## 2021-04-09 NOTE — Patient Instructions (Signed)
[name] , Thank you for taking time to come for your Medicare Wellness Visit. I appreciate your ongoing commitment to your health goals. Please review the following plan we discussed and let me know if I can assist you in the future.   Screening recommendations/referrals: Colonoscopy: Pt stated [facility]  Recommended yearly ophthalmology/optometry visit for glaucoma screening and checkup Recommended yearly dental visit for hygiene and checkup  Vaccinations: Influenza vaccine: Due 05/06/21 Pneumococcal vaccine: Due and discussed  Tdap vaccine: Done 08/14/20 Shingles vaccine: Shingrix discussed. Please contact your pharmacy for coverage information.    Covid-19: Completed 3/26, 4/17, & 07/25/20  Advanced directives: Advance directive discussed with you today. I have provided a copy for you to complete at home and have notarized. Once this is complete please bring a copy in to our office so we can scan it into your chart.  Conditions/risks identified: Go back to school  Next appointment: Follow up in one year for your annual wellness visit   Preventive Care 40-64 Years, Male Preventive care refers to lifestyle choices and visits with your health care provider that can promote health and wellness. What does preventive care include? A yearly physical exam. This is also called an annual well check. Dental exams once or twice a year. Routine eye exams. Ask your health care provider how often you should have your eyes checked. Personal lifestyle choices, including: Daily care of your teeth and gums. Regular physical activity. Eating a healthy diet. Avoiding tobacco and drug use. Limiting alcohol use. Practicing safe sex. Taking low-dose aspirin every day starting at age 19. What happens during an annual well check? The services and screenings done by your health care provider during your annual well check will depend on your age, overall health, lifestyle risk  factors, and family history of disease. Counseling  Your health care provider may ask you questions about your: Alcohol use. Tobacco use. Drug use. Emotional well-being. Home and relationship well-being. Sexual activity. Eating habits. Work and work Astronomer. Screening  You may have the following tests or measurements: Height, weight, and BMI. Blood pressure. Lipid and cholesterol levels. These may be checked every 5 years, or more frequently if you are over 79 years old. Skin check. Lung cancer screening. You may have this screening every year starting at age 41 if you have a 30-pack-year history of smoking and currently smoke or have quit within the past 15 years. Fecal occult blood test (FOBT) of the stool. You may have this test every year starting at age 93. Flexible sigmoidoscopy or colonoscopy. You may have a sigmoidoscopy every 5 years or a colonoscopy every 10 years starting at age 6. Prostate cancer screening. Recommendations will vary depending on your family history and other risks. Hepatitis C blood test. Hepatitis B blood test. Sexually transmitted disease (STD) testing. Diabetes screening. This is done by checking your blood sugar (glucose) after you have not eaten for a while (fasting). You may have this done every 1-3 years. Discuss your test results, treatment options, and if necessary, the need for more tests with your health care provider. Vaccines  Your health care provider may recommend certain vaccines, such as: Influenza vaccine. This is recommended every year. Tetanus, diphtheria, and acellular pertussis (Tdap, Td) vaccine. You may need a Td booster every 10 years. Zoster vaccine. You may need this after age 42. Pneumococcal 13-valent conjugate (PCV13) vaccine. You may need this if you have certain conditions and have not been vaccinated. Pneumococcal polysaccharide (  PPSV23) vaccine. You may need one or two doses if you smoke cigarettes or if you have  certain conditions. Talk to your health care provider about which screenings and vaccines you need and how often you need them. This information is not intended to replace advice given to you by your health care provider. Make sure you discuss any questions you have with your health care provider. Document Released: 10/19/2015 Document Revised: 06/11/2016 Document Reviewed: 07/24/2015 Elsevier Interactive Patient Education  2017 [employer].  Fall Prevention in the Home Falls can cause injuries. They can happen to people of all ages. There are many things you can do to make your home safe and to help prevent falls. What can I do on the outside of my home? Regularly fix the edges of walkways and driveways and fix any cracks. Remove anything that might make you trip as you walk through a door, such as a raised step or threshold. Trim any bushes or trees on the path to your home. Use bright outdoor lighting. Clear any walking paths of anything that might make someone trip, such as rocks or tools. Regularly check to see if handrails are loose or broken. Make sure that both sides of any steps have handrails. Any raised decks and porches should have guardrails on the edges. Have any leaves, snow, or ice cleared regularly. Use sand or salt on walking paths during winter. Clean up any spills in your garage right away. This includes oil or grease spills. What can I do in the bathroom? Use night lights. Install grab bars by the toilet and in the tub and shower. Do not use towel bars as grab bars. Use non-skid mats or decals in the tub or shower. If you need to sit down in the shower, use a plastic, non-slip stool. Keep the floor dry. Clean up any water that spills on the floor as soon as it happens. Remove soap buildup in the tub or shower regularly. Attach bath mats securely with double-sided non-slip rug tape. Do not have throw rugs and other things on the floor that can make you trip. What can  I do in the bedroom? Use night lights. Make sure that you have a light by your bed that is easy to reach. Do not use any sheets or blankets that are too big for your bed. They should not hang down onto the floor. Have a firm chair that has side arms. You can use this for support while you get dressed. Do not have throw rugs and other things on the floor that can make you trip. What can I do in the kitchen? Clean up any spills right away. Avoid walking on wet floors. Keep items that you use a lot in easy-to-reach places. If you need to reach something above you, use a strong step stool that has a grab bar. Keep electrical cords out of the way. Do not use floor polish or wax that makes floors slippery. If you must use wax, use non-skid floor wax. Do not have throw rugs and other things on the floor that can make you trip. What can I do with my stairs? Do not leave any items on the stairs. Make sure that there are handrails on both sides of the stairs and use them. Fix handrails that are broken or loose. Make sure that handrails are as long as the stairways. Check any carpeting to make sure that it is firmly attached to the stairs. Fix any carpet that is loose  or worn. Avoid having throw rugs at the top or bottom of the stairs. If you do have throw rugs, attach them to the floor with carpet tape. Make sure that you have a light switch at the top of the stairs and the bottom of the stairs. If you do not have them, ask someone to add them for you. What else can I do to help prevent falls? Wear shoes that: Do not have high heels. Have rubber bottoms. Are comfortable and fit you well. Are closed at the toe. Do not wear sandals. If you use a stepladder: Make sure that it is fully opened. Do not climb a closed stepladder. Make sure that both sides of the stepladder are locked into place. Ask someone to hold it for you, if possible. Clearly mark and make sure that you can see: Any grab bars or  handrails. First and last steps. Where the edge of each step is. Use tools that help you move around (mobility aids) if they are needed. These include: Canes. Walkers. Scooters. Crutches. Turn on the lights when you go into a dark area. Replace any light bulbs as soon as they burn out. Set up your furniture so you have a clear path. Avoid moving your furniture around. If any of your floors are uneven, fix them. If there are any pets around you, be aware of where they are. Review your medicines with your doctor. Some medicines can make you feel dizzy. This can increase your chance of falling. Ask your doctor what other things that you can do to help prevent falls. This information is not intended to replace advice given to you by your health care provider. Make sure you discuss any questions you have with your health care provider. Document Released: 07/19/2009 Document Revised: 02/28/2016 Document Reviewed: 10/27/2014 Elsevier Interactive Patient Education  2017 [employer].

## 2021-04-10 ENCOUNTER — Telehealth: Payer: Self-pay | Admitting: *Deleted

## 2021-04-10 NOTE — Chronic Care Management (AMB) (Signed)
  Chronic Care Management   Outreach Note  04/10/2021 Name: Obed Samek MRN: [id number] DOB: May 21, 1961  Ojas Coone is a 60 y.o. year old male who is a primary care patient of [place], Timoteo Expose, MD. I reached out to [name] by phone today in response to a referral sent by Mr. Airik Goodlin PCP, Dr. [place].      An unsuccessful telephone outreach was attempted today. The patient was referred to the case management team for assistance with care management and care coordination.   Follow Up Plan: A HIPAA compliant phone message was left for the patient providing contact information and requesting a return call. The care management team will reach out to the patient again over the next 7 days.  If patient returns call to provider office, please advise to call Embedded Care Management Care Guide Gwenevere Ghazi at [phone].  Gwenevere Ghazi  Care Guide, Embedded Care Coordination [facility] Management

## 2021-04-11 ENCOUNTER — Encounter: Payer: Medicare HMO | Admitting: Physical Therapy

## 2021-04-15 ENCOUNTER — Ambulatory Visit: Payer: Medicare HMO | Admitting: Family Medicine

## 2021-04-17 NOTE — Chronic Care Management (AMB) (Signed)
  Chronic Care Management   Outreach Note  04/17/2021 Name: Philip Bates MRN: [id number] DOB: 1961/05/12  Philip Bates is a 60 y.o. year old male who is a primary care patient of [place], Timoteo Expose, MD. I reached out to [name] by phone today in response to a referral sent by [name] PCP, Dr. [place].       A second unsuccessful telephone outreach was attempted today. The patient was referred to the case management team for assistance with care management and care coordination.   Follow Up Plan: A HIPAA compliant phone message was left for the patient providing contact information and requesting a return call. The care management team will reach out to the patient again over the next 7 days.  If patient returns call to provider office, please advise to call Embedded Care Management Care Guide Gwenevere Ghazi at [phone].  Gwenevere Ghazi  Care Guide, Embedded Care Coordination [facility] Management

## 2021-04-24 NOTE — Chronic Care Management (AMB) (Signed)
  Chronic Care Management   Outreach Note  04/24/2021 Name: Philip Bates MRN: [id number] DOB: 1961/01/22  Philip Bates is a 60 y.o. year old male who is a primary care patient of [place], Timoteo Expose, MD. I reached out to Philip Bates by phone today in response to a referral sent by Philip Bates PCP, Dr. [place].      Third unsuccessful telephone outreach was attempted today. The patient was referred to the case management team for assistance with care management and care coordination. The patient's primary care provider has been notified of our unsuccessful attempts to make or maintain contact with the patient. The care management team is pleased to engage with this patient at any time in the future should he/she be interested in assistance from the care management team.   Follow Up Plan: We have been unable to make contact with the patient. The care management team is available to follow up with the patient after provider conversation with the patient regarding recommendation for care management engagement and subsequent re-referral to the care management team. A HIPAA compliant phone message was left for the patient providing contact information and requesting a return call. If patient returns call to provider office, please advise to call Embedded Care Management Care Guide Gwenevere Ghazi at [phone]  [facility] Guide, Embedded Care Coordination [facility] Management

## 2021-04-30 ENCOUNTER — Other Ambulatory Visit: Payer: Self-pay

## 2021-04-30 ENCOUNTER — Encounter: Payer: Self-pay | Admitting: Family Medicine

## 2021-04-30 ENCOUNTER — Ambulatory Visit (INDEPENDENT_AMBULATORY_CARE_PROVIDER_SITE_OTHER): Payer: Medicare HMO | Admitting: Family Medicine

## 2021-04-30 VITALS — BP 120/72 | HR 70 | Resp 16 | Ht 64.0 in | Wt 113.0 lb

## 2021-04-30 DIAGNOSIS — N529 Male erectile dysfunction, unspecified: Secondary | ICD-10-CM | POA: Diagnosis not present

## 2021-04-30 DIAGNOSIS — R7303 Prediabetes: Secondary | ICD-10-CM | POA: Diagnosis not present

## 2021-04-30 DIAGNOSIS — D509 Iron deficiency anemia, unspecified: Secondary | ICD-10-CM | POA: Diagnosis not present

## 2021-04-30 DIAGNOSIS — F419 Anxiety disorder, unspecified: Secondary | ICD-10-CM

## 2021-04-30 DIAGNOSIS — R5383 Other fatigue: Secondary | ICD-10-CM | POA: Diagnosis not present

## 2021-04-30 DIAGNOSIS — G809 Cerebral palsy, unspecified: Secondary | ICD-10-CM | POA: Diagnosis not present

## 2021-04-30 DIAGNOSIS — F321 Major depressive disorder, single episode, moderate: Secondary | ICD-10-CM | POA: Insufficient documentation

## 2021-04-30 DIAGNOSIS — G4700 Insomnia, unspecified: Secondary | ICD-10-CM

## 2021-04-30 LAB — CBC WITH DIFFERENTIAL/PLATELET
Basophils Absolute: 0.1 10*3/uL (ref 0.0–0.1)
Basophils Relative: 1.2 % (ref 0.0–3.0)
Eosinophils Absolute: 0.1 10*3/uL (ref 0.0–0.7)
Eosinophils Relative: 2.8 % (ref 0.0–5.0)
HCT: 41.2 % (ref 39.0–52.0)
Hemoglobin: 13.6 g/dL (ref 13.0–17.0)
Lymphocytes Relative: 37.9 % (ref 12.0–46.0)
Lymphs Abs: 1.9 10*3/uL (ref 0.7–4.0)
MCHC: 33 g/dL (ref 30.0–36.0)
MCV: 81.4 fl (ref 78.0–100.0)
Monocytes Absolute: 0.5 10*3/uL (ref 0.1–1.0)
Monocytes Relative: 10.1 % (ref 3.0–12.0)
Neutro Abs: 2.4 10*3/uL (ref 1.4–7.7)
Neutrophils Relative %: 48 % (ref 43.0–77.0)
Platelets: 228 10*3/uL (ref 150.0–400.0)
RBC: 5.06 Mil/uL (ref 4.22–5.81)
RDW: 14.9 % (ref 11.5–15.5)
WBC: 4.9 10*3/uL (ref 4.0–10.5)

## 2021-04-30 LAB — FERRITIN: Ferritin: 16.9 ng/mL — ABNORMAL LOW (ref 22.0–322.0)

## 2021-04-30 LAB — BASIC METABOLIC PANEL
BUN: 11 mg/dL (ref 6–23)
CO2: 30 mEq/L (ref 19–32)
Calcium: 9.5 mg/dL (ref 8.4–10.5)
Chloride: 104 mEq/L (ref 96–112)
Creatinine, Ser: 0.86 mg/dL (ref 0.40–1.50)
GFR: 95.58 mL/min (ref >60.00)
Glucose, Bld: 78 mg/dL (ref 70–99)
Potassium: 3.8 mEq/L (ref 3.5–5.1)
Sodium: 140 mEq/L (ref 135–145)

## 2021-04-30 LAB — TSH: TSH: 1.71 u[IU]/mL (ref 0.35–5.50)

## 2021-04-30 LAB — HEMOGLOBIN A1C: Hgb A1c MFr Bld: 6.1 % (ref 4.6–6.5)

## 2021-04-30 LAB — TESTOSTERONE: Testosterone: 354.75 ng/dL (ref 300.00–890.00)

## 2021-04-30 LAB — IRON: Iron: 126 ug/dL (ref 42–165)

## 2021-04-30 MED ORDER — SILDENAFIL CITRATE 20 MG PO TABS: 40.0000 mg | ORAL_TABLET | Freq: Every day | ORAL | 1 refills | Status: DC | PRN

## 2021-04-30 MED ORDER — MIRTAZAPINE 15 MG PO TABS: 15.0000 mg | ORAL_TABLET | Freq: Every day | ORAL | 1 refills | Status: DC

## 2021-04-30 NOTE — Patient Instructions (Addendum)
A few things to remember from today's visit:   Fatigue, unspecified type - Plan: Basic metabolic panel, TSH, Testosterone  Cerebral palsy, unspecified type (HCC), Chronic  Prediabetes - Plan: Hemoglobin A1c  Erectile dysfunction, unspecified erectile dysfunction type - Plan: TSH, Testosterone, sildenafil (REVATIO) 20 MG tablet  Iron deficiency anemia, unspecified iron deficiency anemia type - Plan: Ferritin, Iron, CBC with Differential/Platelet  Anxiety disorder, unspecified type - Plan: mirtazapine (REMERON) 15 MG tablet  Insomnia, unspecified type - Plan: mirtazapine (REMERON) 15 MG tablet  If you need refills please call your pharmacy. Do not use My Chart to request refills or for acute issues that need immediate attention.   Mirtazapine 15 mg to start 1/2 tab fist and at bedtime. Increase to whole tab in 2 weeks if well tolerated. Sildenafil 20 mg to take 2-3 tab daily as needed.  Please be sure medication list is accurate. If a new problem present, please set up appointment sooner than planned today.

## 2021-04-30 NOTE — Assessment & Plan Note (Signed)
We discussed possible etiologies as well as pharmacologic treatment options. He agrees with trying higher doses of sildenafil, 40 to 60 mg daily as needed. We discussed some side effects.

## 2021-04-30 NOTE — Assessment & Plan Note (Signed)
Mirtazapine 15 mg started today, recommend starting with 1/2 tablet, dose can be increased if well-tolerated. We discussed some side effects. Instructed about warning signs.

## 2021-04-30 NOTE — Assessment & Plan Note (Signed)
We discussed treatment options. Because of insomnia trazodone and mirtazapine has son that can help off.  He tried trazodone in the past, did not help. Mirtazapine 15 mg at bedtime, recommend starting with 1/2 tablet. We discussed some side effects.

## 2021-04-30 NOTE — Assessment & Plan Note (Signed)
Ambien and trazodone did not help in the past. Good sleep hygiene. Mirtazapine started today may help.

## 2021-04-30 NOTE — Assessment & Plan Note (Signed)
With associated chronic pain. Following with orthopedist and pain management.

## 2021-04-30 NOTE — Assessment & Plan Note (Signed)
A healthy lifestyle encouraged for diabetes prevention. Further recommendation will be given according to A1c result.

## 2021-04-30 NOTE — Progress Notes (Signed)
Chief Complaint  Patient presents with   Fatigue   Erectile Dysfunction   HPI: Philip Bates is a 60 y.o. male, who is here today with above complaints. Fatigue has been going on for a while but worse after left hip surgery, total hip replacement in 09/2020.He is not healing as he was expecting, more limitations in ADL's. States that prothesis is "heavy", so he has to wear a brace and a cane. Cerebral palsy with sciatic palsy follows with orthopedist and pain management. He is on gabapentin 300 mg 3 times daily. PT has not helped.  Feeling depressed and anxious. Exacerbated by recent health problems and financial stress.  Insomnia: This has been going on intermittently for years. Chronic pain aggravates problem. Ambien does not help. States that he does "not sleep at all." Denies hx of depression or anxiety. Family history negative for bipolar disorder.  He has taken Trazodone years ago. He also tried Baclofen recently and did not help.  Depression screen [facility] 2/9 04/30/2021 04/09/2021 06/15/2020  Decreased Interest 2 0 0  Down, Depressed, Hopeless 3 1 0  PHQ - 2 Score 5 1 0  Altered sleeping 3 - -  Tired, decreased energy 2 - -  Change in appetite 1 - -  Feeling bad or failure about yourself  2 - -  Trouble concentrating 2 - -  Moving slowly or fidgety/restless 1 - -  Suicidal thoughts 0 - -  PHQ-9 Score 16 - -  Difficult doing work/chores Very difficult - -   GAD 7 : Generalized Anxiety Score 04/30/2021  Nervous, Anxious, on Edge 3  Control/stop worrying 3  Worry too much - different things 3  Trouble relaxing 3  Restless 3  Easily annoyed or irritable 3  Afraid - awful might happen 2  Total GAD 7 Score 20  Anxiety Difficulty Very difficult   Prediabetes: He has not noted polyuria, polydipsia, polyphagia.  Lab Results  Component Value Date   HGBA1C 5.8 (H) 09/06/2020   Lab Results  Component Value Date   CREATININE 0.94 09/11/2020   BUN 17 09/11/2020    NA 140 09/11/2020   K 3.9 09/11/2020   CL 105 09/11/2020   CO2 23 09/11/2020   Anemia: History of sickle cell trait. He has not noted heartburn, blood blood in his stool, melena, gross hematuria, or easy bruising. He is not on iron supplementation. No pica. He has not noted SOB,CP,or palpitation. Colonoscopy on 08/13/2020: Otherwise normal except for moderate sigmoid diverticulosis.  10-year follow-up was recommended.  Lab Results  Component Value Date   WBC 10.8 (H) 09/11/2020   HGB 7.8 (L) 09/11/2020   HCT 23.5 (L) 09/11/2020   MCV 82.2 09/11/2020   PLT 209 09/11/2020   ED: he has taken Sildenafil 20 mg, did not help. Problem seems to be worse since 09/2020. He has not tried other mediation.  Review of Systems  Constitutional:  Positive for activity change and appetite change. Negative for fever.  HENT:  Negative for mouth sores, nosebleeds, sore throat and trouble swallowing.   Respiratory:  Negative for cough and wheezing.   Gastrointestinal:  Negative for abdominal pain, nausea and vomiting.  Endocrine: Negative for cold intolerance and heat intolerance.  Genitourinary:  Negative for genital sores, penile discharge and testicular pain.  Musculoskeletal:  Positive for arthralgias, back pain, gait problem and neck pain.  Neurological:  Negative for syncope, weakness and headaches.  Psychiatric/Behavioral:  Positive for sleep disturbance. Negative for confusion and hallucinations.  The patient is nervous/anxious.   Rest see pertinent positives and negatives per HPI.  Current Outpatient Medications on File Prior to Visit  Medication Sig Dispense Refill   aspirin EC 81 MG tablet Take 1 tablet (81 mg total) by mouth 2 (two) times daily. 84 tablet 0   ergocalciferol (VITAMIN D2) 1.25 MG (50000 UT) capsule 1 (ONE) CAPSULE CAPSULE BY MOUTH ONCE A WEEK     gabapentin (NEURONTIN) 300 MG capsule Take 1 capsule (300 mg total) by mouth 3 (three) times daily. 90 capsule 0   Multiple  Vitamin (MULTIVITAMIN WITH MINERALS) TABS tablet Take 1 tablet by mouth 3 (three) times a week.     NARCAN 4 MG/0.1ML LIQD nasal spray kit Place 1 spray into the nose as needed (opioid overdose).     oxyCODONE-acetaminophen (PERCOCET) 5-325 MG tablet Take 1-2 tablets by mouth every 8 (eight) hours as needed for severe pain. (Patient taking differently: Take 10-325 tablets by mouth every 8 (eight) hours as needed for severe pain.) 40 tablet 0   No current facility-administered medications on file prior to visit.   Past Medical History:  Diagnosis Date   Arthritis    Cerebral palsy ([place])    Cervical radiculopathy    H/O umbilical hernia repair 3532   Pre-diabetes    Scoliosis    Spondylosis of cervical spine    Allergies  Allergen Reactions   Naproxen Other (See Comments)    Upset stomach   Flexeril [Cyclobenzaprine] Nausea Only   Social History   Socioeconomic History   Marital status: Married    Spouse name: Not on file   Number of children: Not on file   Years of education: Not on file   Highest education level: Not on file  Occupational History   Not on file  Tobacco Use   Smoking status: Every Day    Packs/day: 1.00    Years: 35.00    Pack years: 35.00    Types: Cigarettes   Smokeless tobacco: Never  Vaping Use   Vaping Use: Never used  Substance and Sexual Activity   Alcohol use: Never   Drug use: Never   Sexual activity: Yes  Other Topics Concern   Not on file  Social History Narrative   ** Merged History Encounter **       Social Determinants of Health   Financial Resource Strain: Medium Risk   Difficulty of Paying Living Expenses: Somewhat hard  Food Insecurity: No Food Insecurity   Worried About Charity fundraiser in the Last Year: Never true   Ran Out of Food in the Last Year: Never true  Transportation Needs: No Transportation Needs   Lack of Transportation (Medical): No   Lack of Transportation (Non-Medical): No  Physical Activity:  Insufficiently Active   Days of Exercise per Week: 3 days   Minutes of Exercise per Session: 30 min  Stress: Stress Concern Present   Feeling of Stress : To some extent  Social Connections: Moderately Isolated   Frequency of Communication with Friends and Family: More than three times a week   Frequency of Social Gatherings with Friends and Family: Once a week   Attends Religious Services: Never   Marine scientist or Organizations: No   Attends Archivist Meetings: Never   Marital Status: Married    Vitals:   04/30/21 1044  BP: 120/72  Pulse: 70  Resp: 16  SpO2: 99%   Body mass index is 19.4 kg/m.  Physical  Exam Vitals and nursing note reviewed.  Constitutional:      General: He is not in acute distress.    Appearance: He is well-developed.  HENT:     Head: Normocephalic and atraumatic.  Eyes:     Conjunctiva/sclera: Conjunctivae normal.  Cardiovascular:     Rate and Rhythm: Normal rate and regular rhythm.     Heart sounds: No murmur heard. Pulmonary:     Effort: Pulmonary effort is normal. No respiratory distress.     Breath sounds: Normal breath sounds.  Abdominal:     Palpations: Abdomen is soft. There is no hepatomegaly or mass.     Tenderness: There is no abdominal tenderness.  Musculoskeletal:     Comments: Left LE brace, antalgic instable gait assisted by a cane.  Lymphadenopathy:     Cervical: No cervical adenopathy.  Skin:    General: Skin is warm.     Findings: No erythema or rash.  Neurological:     Mental Status: He is alert and oriented to person, place, and time.     Cranial Nerves: No cranial nerve deficit.     Motor: Atrophy present.     Comments: Atrophy and contractures mainly LE's.  Psychiatric:        Mood and Affect: Mood is anxious.     Comments: Well groomed, good eye contact.   ASSESSMENT AND PLAN:  Philip Bates was seen today for fatigue and erectile dysfunction.  Diagnoses and all orders for this visit: Orders  Placed This Encounter  Procedures   Ferritin   Iron   Basic metabolic panel   CBC with Differential/Platelet   TSH   Testosterone   Hemoglobin A1c   Lab Results  Component Value Date   WBC 4.9 04/30/2021   HGB 13.6 04/30/2021   HCT 41.2 04/30/2021   MCV 81.4 04/30/2021   PLT 228.0 04/30/2021   Lab Results  Component Value Date   TESTOSTERONE 354.75 04/30/2021   Lab Results  Component Value Date   TSH 1.71 04/30/2021   Lab Results  Component Value Date   HGBA1C 6.1 04/30/2021   Lab Results  Component Value Date   CREATININE 0.86 04/30/2021   BUN 11 04/30/2021   NA 140 04/30/2021   K 3.8 04/30/2021   CL 104 04/30/2021   CO2 30 04/30/2021   Fatigue, unspecified type We discussed possible etiologies: Systemic illness, immunologic,endocrinology,sleep disorder, psychiatric/psychologic, infectious,medications side effects, and idiopathic. Examination today does not suggest a serious process. Some of his chronic medical problems as well as medications could be contributing factors. Improving sleep hygiene will help. Further recommendations will be given according to lab results.  Erectile dysfunction We discussed possible etiologies as well as pharmacologic treatment options. He agrees with trying higher doses of sildenafil, 40 to 60 mg daily as needed. We discussed some side effects.  Cerebral palsy ([place]) With associated chronic pain. Following with orthopedist and pain management.  Anxiety disorder We discussed treatment options. Because of insomnia trazodone and mirtazapine has son that can help off.  He tried trazodone in the past, did not help. Mirtazapine 15 mg at bedtime, recommend starting with 1/2 tablet. We discussed some side effects.  Insomnia Ambien and trazodone did not help in the past. Good sleep hygiene. Mirtazapine started today may help.  Iron deficiency anemia He is not on iron supplementation. Problem could have been evaluated after hip  surgery. Further recommendation will be given according to CBC/iron results.  Prediabetes A healthy lifestyle encouraged for diabetes  prevention. Further recommendation will be given according to A1c result.  Depression, major, single episode, moderate (HCC) Mirtazapine 15 mg started today, recommend starting with 1/2 tablet, dose can be increased if well-tolerated. We discussed some side effects. Instructed about warning signs.  I spent a total of 42 minutes in both face to face and non face to face activities for this visit on the date of this encounter. During this time history was obtained and documented, examination was performed, prior labs/imaging reviewed, and assessment/plan discussed.  Return in about 5 weeks (around 06/04/2021) for Insomnia and depression/anxiety..   Silas Sedam G. [place], MD  [facility]. [place] office.

## 2021-04-30 NOTE — Assessment & Plan Note (Signed)
He is not on iron supplementation. Problem could have been evaluated after hip surgery. Further recommendation will be given according to CBC/iron results.

## 2021-05-02 ENCOUNTER — Telehealth: Payer: Self-pay | Admitting: Family Medicine

## 2021-05-02 DIAGNOSIS — N529 Male erectile dysfunction, unspecified: Secondary | ICD-10-CM

## 2021-05-02 NOTE — Telephone Encounter (Signed)
Pt call and stated he need a Prior authorization  for the sinus medication that was call in for him.

## 2021-05-03 MED ORDER — SILDENAFIL CITRATE 20 MG PO TABS: 40.0000 mg | ORAL_TABLET | Freq: Every day | ORAL | 1 refills | Status: DC | PRN

## 2021-05-03 NOTE — Telephone Encounter (Signed)
I called and spoke with patient. Made him aware that his insurance does not cover the Sildenafil, but we can send it to [facility] with a GoodRx coupon for around $13. Patient in agreement and Rx re-sent to [facility] on [address] with coupon.

## 2021-05-17 ENCOUNTER — Ambulatory Visit: Payer: Medicare HMO | Admitting: Orthopaedic Surgery

## 2021-05-21 ENCOUNTER — Ambulatory Visit: Payer: Medicare HMO | Admitting: Orthopaedic Surgery

## 2021-05-27 ENCOUNTER — Other Ambulatory Visit: Payer: Self-pay

## 2021-05-27 DIAGNOSIS — F321 Major depressive disorder, single episode, moderate: Secondary | ICD-10-CM

## 2021-05-27 DIAGNOSIS — G4700 Insomnia, unspecified: Secondary | ICD-10-CM

## 2021-05-27 DIAGNOSIS — F419 Anxiety disorder, unspecified: Secondary | ICD-10-CM

## 2021-05-27 MED ORDER — MIRTAZAPINE 15 MG PO TABS: 15.0000 mg | ORAL_TABLET | Freq: Every day | ORAL | 1 refills | Status: DC

## 2021-05-29 ENCOUNTER — Telehealth: Payer: Self-pay | Admitting: Orthopaedic Surgery

## 2021-05-29 NOTE — Telephone Encounter (Signed)
Yes [facility].  Thanks.

## 2021-05-29 NOTE — Telephone Encounter (Signed)
Patient called needing to get a shoe lift for his left foot. The number to contact patient is [phone]

## 2021-05-30 NOTE — Telephone Encounter (Signed)
Patient aware. Rx at the front desk.

## 2021-06-12 ENCOUNTER — Ambulatory Visit (INDEPENDENT_AMBULATORY_CARE_PROVIDER_SITE_OTHER): Payer: Medicare HMO | Admitting: Family Medicine

## 2021-06-12 ENCOUNTER — Other Ambulatory Visit: Payer: Self-pay

## 2021-06-12 ENCOUNTER — Encounter: Payer: Self-pay | Admitting: Family Medicine

## 2021-06-12 VITALS — BP 120/70 | HR 96 | Resp 16 | Ht 64.0 in | Wt 121.0 lb

## 2021-06-12 DIAGNOSIS — R351 Nocturia: Secondary | ICD-10-CM

## 2021-06-12 DIAGNOSIS — R350 Frequency of micturition: Secondary | ICD-10-CM

## 2021-06-12 DIAGNOSIS — N401 Enlarged prostate with lower urinary tract symptoms: Secondary | ICD-10-CM

## 2021-06-12 DIAGNOSIS — N529 Male erectile dysfunction, unspecified: Secondary | ICD-10-CM

## 2021-06-12 DIAGNOSIS — G4700 Insomnia, unspecified: Secondary | ICD-10-CM

## 2021-06-12 LAB — URINALYSIS, ROUTINE W REFLEX MICROSCOPIC
Bilirubin Urine: NEGATIVE
Hgb urine dipstick: NEGATIVE
Ketones, ur: NEGATIVE
Leukocytes,Ua: NEGATIVE
Nitrite: NEGATIVE
Specific Gravity, Urine: 1.02 (ref 1.000–1.030)
Total Protein, Urine: NEGATIVE
Urine Glucose: NEGATIVE
Urobilinogen, UA: 0.2 (ref 0.0–1.0)
pH: 6 (ref 5.0–8.0)

## 2021-06-12 LAB — PSA: PSA: 1.54 ng/mL (ref 0.10–4.00)

## 2021-06-12 MED ORDER — SILDENAFIL CITRATE 20 MG PO TABS: 60.0000 mg | ORAL_TABLET | Freq: Every day | ORAL | 2 refills | Status: DC | PRN

## 2021-06-12 MED ORDER — TAMSULOSIN HCL 0.4 MG PO CAPS: 0.4000 mg | ORAL_CAPSULE | Freq: Every day | ORAL | 3 refills | Status: DC

## 2021-06-12 MED ORDER — AMITRIPTYLINE HCL 25 MG PO TABS: 25.0000 mg | ORAL_TABLET | Freq: Every day | ORAL | 1 refills | Status: DC

## 2021-06-12 NOTE — Assessment & Plan Note (Signed)
Mirtazapine did not help, so d/c. Amitriptyline 25 mg started today, recommend taking 1/2 tab for a few days and increase if well tolerated. Good sleep hygiene.

## 2021-06-12 NOTE — Progress Notes (Signed)
HPI:  Philip Bates is a 60 y.o. male, who is here today for follow up.   He was last seen on 04/30/21, when he was concerned about wt loss,depression,and insomnia. Last visit Mirtazapine 15 mg was added.  States that medication is not helping with sleep or mood. Trazodone did not help in the past. Waking up a few times through the night. Problem has been going on for years.  Still feeling "down", in bed "the whole day" and not motivation.  He is back to school, social work, 2 times per week.  In the past he was on Lyrica and it helped with sleep along with Amitriptyline. Numbness ,tingling,and burning of hands,feet, and arms stable for years. Paresthesia are mainly at night, interfering with sleep. He is on Gabapentin 300 mg tid. He is following with pain management.  Urinary frequency for a while, he wonders UTI. Nocturia 1-2 times. Voiding every 4 hours. + Urgency, occasionally incontinence. Sometimes dribbling and urine stream is weak. Hx of BPH. Father ? Prostate problems,testicles removed as part of treatment. ED on Sildenafil 20 mg, takes 3 tabs daily and needed and helps.  Flomax was recommended in 12/2018 for BPH, he does not remember trying. Lab Results  Component Value Date   PSA 2.57 12/14/2018   Review of Systems  Constitutional:  Positive for activity change, appetite change and fatigue. Negative for fever.  HENT:  Negative for mouth sores and sore throat.   Respiratory:  Negative for cough, shortness of breath and wheezing.   Cardiovascular:  Negative for chest pain, palpitations and leg swelling.  Gastrointestinal:  Negative for abdominal pain, nausea and vomiting.  Genitourinary:  Negative for decreased urine volume, dysuria and hematuria.  Musculoskeletal:  Positive for arthralgias, back pain and gait problem.  Skin:  Negative for pallor and rash.  Neurological:  Negative for syncope and headaches.  Psychiatric/Behavioral:  Positive for sleep  disturbance. Negative for confusion, hallucinations and suicidal ideas.   Rest of ROS, see pertinent positives sand negatives in HPI  Current Outpatient Medications on File Prior to Visit  Medication Sig Dispense Refill   aspirin EC 81 MG tablet Take 1 tablet (81 mg total) by mouth 2 (two) times daily. 84 tablet 0   ergocalciferol (VITAMIN D2) 1.25 MG (50000 UT) capsule 1 (ONE) CAPSULE CAPSULE BY MOUTH ONCE A WEEK     gabapentin (NEURONTIN) 300 MG capsule Take 1 capsule (300 mg total) by mouth 3 (three) times daily. 90 capsule 0   Multiple Vitamin (MULTIVITAMIN WITH MINERALS) TABS tablet Take 1 tablet by mouth 3 (three) times a week.     NARCAN 4 MG/0.1ML LIQD nasal spray kit Place 1 spray into the nose as needed (opioid overdose).     oxyCODONE-acetaminophen (PERCOCET) 5-325 MG tablet Take 1-2 tablets by mouth every 8 (eight) hours as needed for severe pain. (Patient taking differently: Take 10-325 tablets by mouth every 8 (eight) hours as needed for severe pain.) 40 tablet 0   No current facility-administered medications on file prior to visit.   Past Medical History:  Diagnosis Date   Arthritis    Cerebral palsy ([place])    Cervical radiculopathy    H/O umbilical hernia repair 8299   Pre-diabetes    Scoliosis    Spondylosis of cervical spine    Allergies  Allergen Reactions   Naproxen Other (See Comments)    Upset stomach   Flexeril [Cyclobenzaprine] Nausea Only   Social History   Socioeconomic History  Marital status: Married    Spouse name: Not on file   Number of children: Not on file   Years of education: Not on file   Highest education level: Not on file  Occupational History   Not on file  Tobacco Use   Smoking status: Every Day    Packs/day: 1.00    Years: 35.00    Pack years: 35.00    Types: Cigarettes   Smokeless tobacco: Never  Vaping Use   Vaping Use: Never used  Substance and Sexual Activity   Alcohol use: Never   Drug use: Never   Sexual activity: Yes   Other Topics Concern   Not on file  Social History Narrative   ** Merged History Encounter **       Social Determinants of Health   Financial Resource Strain: Medium Risk   Difficulty of Paying Living Expenses: Somewhat hard  Food Insecurity: No Food Insecurity   Worried About Charity fundraiser in the Last Year: Never true   Ran Out of Food in the Last Year: Never true  Transportation Needs: No Transportation Needs   Lack of Transportation (Medical): No   Lack of Transportation (Non-Medical): No  Physical Activity: Insufficiently Active   Days of Exercise per Week: 3 days   Minutes of Exercise per Session: 30 min  Stress: Stress Concern Present   Feeling of Stress : To some extent  Social Connections: Moderately Isolated   Frequency of Communication with Friends and Family: More than three times a week   Frequency of Social Gatherings with Friends and Family: Once a week   Attends Religious Services: Never   Marine scientist or Organizations: No   Attends Archivist Meetings: Never   Marital Status: Married    Vitals:   06/12/21 0902  BP: 120/70  Pulse: 96  Resp: 16  SpO2: 98%   Wt Readings from Last 3 Encounters:  06/12/21 121 lb (54.9 kg)  04/30/21 113 lb (51.3 kg)  04/09/21 115 lb 6.4 oz (52.3 kg)   Body mass index is 20.77 kg/m.  Physical Exam Nursing note reviewed. Exam conducted with a chaperone present.  Constitutional:      General: He is not in acute distress.    Appearance: He is well-developed.  HENT:     Head: Normocephalic and atraumatic.  Eyes:     Conjunctiva/sclera: Conjunctivae normal.  Cardiovascular:     Rate and Rhythm: Normal rate and regular rhythm.     Pulses:          Dorsalis pedis pulses are 2+ on the right side and 2+ on the left side.     Heart sounds: No murmur heard. Pulmonary:     Effort: Pulmonary effort is normal. No respiratory distress.     Breath sounds: Normal breath sounds.  Abdominal:      Palpations: Abdomen is soft. There is no hepatomegaly or mass.     Tenderness: There is no abdominal tenderness.  Genitourinary:    Prostate: Enlarged. Not tender and no nodules present.  Musculoskeletal:     Right lower leg: Deformity present.     Left lower leg: Deformity present.  Lymphadenopathy:     Cervical: No cervical adenopathy.  Skin:    General: Skin is warm.     Findings: No erythema or rash.  Neurological:     Mental Status: He is alert and oriented to person, place, and time.     Cranial Nerves: No cranial  nerve deficit.     Gait: Gait abnormal (Not assisted.).  Psychiatric:        Mood and Affect: Mood is anxious.     Comments: Well groomed, good eye contact.   ASSESSMENT AND PLAN:  Philip Bates was seen today for follow-up.  Orders Placed This Encounter  Procedures   Urinalysis, Routine w reflex microscopic   PSA   Lab Results  Component Value Date   PSA 1.54 06/12/2021   PSA 2.57 12/14/2018   Insomnia Mirtazapine did not help, so d/c. Amitriptyline 25 mg started today, recommend taking 1/2 tab for a few days and increase if well tolerated. Good sleep hygiene.  Erectile dysfunction Improved. Sildenafil 60 mg to continue. Some side effects discussed.  BPH associated with nocturia Educated about Dx,prognosis,and treatment options. Flomax 0.4 mg daily recommended. Further recommendations according to PSA result.  Urinary frequency We discussed possible etiologies, hx does not suggest UTI. Flomax started today. Further recommendations according to lab results.  Return in about 4 weeks (around 07/10/2021).   Philip Candler G. Martin[place], MD  [facility]. [place] office.

## 2021-06-12 NOTE — Assessment & Plan Note (Signed)
Improved. Sildenafil 60 mg to continue. Some side effects discussed.

## 2021-06-12 NOTE — Patient Instructions (Addendum)
A few things to remember from today's visit:   BPH associated with nocturia - Plan: PSA, tamsulosin (FLOMAX) 0.4 MG CAPS capsule  Erectile dysfunction, unspecified erectile dysfunction type - Plan: sildenafil (REVATIO) 20 MG tablet  Urinary frequency - Plan: Urinalysis, Routine w reflex microscopic, PSA Flomax started today to take at night.  Amitriptyline 25 mg, start 1/2 tab for a few days and take it at bedtime.  Benign Prostatic Hyperplasia Benign prostatic hyperplasia (BPH) is an enlarged prostate gland that is caused by the normal aging process and not by cancer. The prostate is a walnut-sized gland that is involved in the production of semen. It is located in front of the rectum and below the bladder. The bladder stores urine and the urethra is the tube that carries the urine out of the body. The prostate may get bigger as a man gets older. An enlarged prostate can press on the urethra. This can make it harder to pass urine. The build-up of urine in the bladder can cause infection. Back pressure and infection may progress to bladder damage and kidney (renal) failure. What are the causes? This condition is part of a normal aging process. However, not all men develop problems from this condition. If the prostate enlarges away from the urethra, urine flow will not be blocked. If it enlarges toward the urethra and compresses it, there will be problems passing urine. What increases the risk? This condition is more likely to develop in men over the age of 50 years. What are the signs or symptoms? Symptoms of this condition include: Getting up often during the night to urinate. Needing to urinate frequently during the day. Difficulty starting urine flow. Decrease in size and strength of your urine stream. Leaking (dribbling) after urinating. Inability to pass urine. This needs immediate treatment. Inability to completely empty your bladder. Pain when you pass urine. This is more common if  there is also an infection. Urinary tract infection (UTI). How is this diagnosed? This condition is diagnosed based on your medical history, a physical exam, and your symptoms. Tests will also be done, such as: A post-void bladder scan. This measures any amount of urine that may remain in your bladder after you finish urinating. A digital rectal exam. In a rectal exam, your health care provider checks your prostate by putting a lubricated, gloved finger into your rectum to feel the back of your prostate gland. This exam detects the size of your gland and any abnormal lumps or growths. An exam of your urine (urinalysis). A prostate specific antigen (PSA) screening. This is a blood test used to screen for prostate cancer. An ultrasound. This test uses sound waves to electronically produce a picture of your prostate gland. Your health care provider may refer you to a specialist in kidney and prostate diseases (urologist). How is this treated? Once symptoms begin, your health care provider will monitor your condition (active surveillance or watchful waiting). Treatment for this condition will depend on the severity of your condition. Treatment may include: Observation and yearly exams. This may be the only treatment needed if your condition and symptoms are mild. Medicines to relieve your symptoms, including: Medicines to shrink the prostate. Medicines to relax the muscle of the prostate. Surgery in severe cases. Surgery may include: Prostatectomy. In this procedure, the prostate tissue is removed completely through an open incision or with a laparoscope or robotics. Transurethral resection of the prostate (TURP). In this procedure, a tool is inserted through the opening at the  tip of the penis (urethra). It is used to cut away tissue of the inner core of the prostate. The pieces are removed through the same opening of the penis. This removes the blockage. Transurethral incision (TUIP). In this  procedure, small cuts are made in the prostate. This lessens the prostate's pressure on the urethra. Transurethral microwave thermotherapy (TUMT). This procedure uses microwaves to create heat. The heat destroys and removes a small amount of prostate tissue. Transurethral needle ablation (TUNA). This procedure uses radio frequencies to destroy and remove a small amount of prostate tissue. Interstitial laser coagulation (ILC). This procedure uses a laser to destroy and remove a small amount of prostate tissue. Transurethral electrovaporization (TUVP). This procedure uses electrodes to destroy and remove a small amount of prostate tissue. Prostatic urethral lift. This procedure inserts an implant to push the lobes of the prostate away from the urethra. Follow these instructions at home: Take over-the-counter and prescription medicines only as told by your health care provider. Monitor your symptoms for any changes. Contact your health care provider with any changes. Avoid drinking large amounts of liquid before going to bed or out in public. Avoid or reduce how much caffeine or alcohol you drink. Give yourself time when you urinate. Keep all follow-up visits as told by your health care provider. This is important. Contact a health care provider if: You have unexplained back pain. Your symptoms do not get better with treatment. You develop side effects from the medicine you are taking. Your urine becomes very dark or has a bad smell. Your lower abdomen becomes distended and you have trouble passing your urine. Get help right away if: You have a fever or chills. You suddenly cannot urinate. You feel lightheaded, or very dizzy, or you faint. There are large amounts of blood or clots in the urine. Your urinary problems become hard to manage. You develop moderate to severe low back or flank pain. The flank is the side of your body between the ribs and the hip. These symptoms may represent a  serious problem that is an emergency. Do not wait to see if the symptoms will go away. Get medical help right away. Call your local emergency services (911 in the [place] Do not drive yourself to the hospital. Summary Benign prostatic hyperplasia (BPH) is an enlarged prostate that is caused by the normal aging process and not by cancer. An enlarged prostate can press on the urethra. This can make it hard to pass urine. This condition is part of a normal aging process and is more likely to develop in men over the age of 50 years. Get help right away if you suddenly cannot urinate. This information is not intended to replace advice given to you by your health care provider. Make sure you discuss any questions you have with your health care provider. Document Revised: 01/02/2021 Document Reviewed: 05/31/2020 Elsevier Patient Education  2022 [employer].  If you need refills please call your pharmacy. Do not use My Chart to request refills or for acute issues that need immediate attention.    Please be sure medication list is accurate. If a new problem present, please set up appointment sooner than planned today.

## 2021-06-12 NOTE — Assessment & Plan Note (Signed)
Educated about Dx,prognosis,and treatment options. Flomax 0.4 mg daily recommended. Further recommendations according to PSA result.

## 2021-06-14 ENCOUNTER — Ambulatory Visit: Payer: Medicare HMO | Admitting: Orthopaedic Surgery

## 2021-07-09 ENCOUNTER — Other Ambulatory Visit: Payer: Self-pay

## 2021-07-10 ENCOUNTER — Ambulatory Visit: Payer: Medicare HMO | Admitting: Family Medicine

## 2021-08-08 ENCOUNTER — Other Ambulatory Visit: Payer: Self-pay | Admitting: Family Medicine

## 2021-09-07 ENCOUNTER — Other Ambulatory Visit: Payer: Self-pay | Admitting: Family Medicine

## 2021-09-07 DIAGNOSIS — R351 Nocturia: Secondary | ICD-10-CM

## 2021-09-07 DIAGNOSIS — N401 Enlarged prostate with lower urinary tract symptoms: Secondary | ICD-10-CM

## 2021-09-13 ENCOUNTER — Other Ambulatory Visit: Payer: Self-pay

## 2021-09-13 MED ORDER — AMITRIPTYLINE HCL 25 MG PO TABS: ORAL_TABLET | ORAL | 1 refills | Status: DC

## 2021-10-10 ENCOUNTER — Other Ambulatory Visit: Payer: Self-pay | Admitting: Family Medicine

## 2021-10-10 DIAGNOSIS — N529 Male erectile dysfunction, unspecified: Secondary | ICD-10-CM

## 2021-10-15 ENCOUNTER — Ambulatory Visit: Payer: Medicare HMO | Admitting: Orthopaedic Surgery

## 2021-10-22 ENCOUNTER — Other Ambulatory Visit: Payer: Self-pay

## 2021-10-22 ENCOUNTER — Ambulatory Visit (INDEPENDENT_AMBULATORY_CARE_PROVIDER_SITE_OTHER): Payer: Medicare HMO

## 2021-10-22 ENCOUNTER — Ambulatory Visit: Payer: Medicare HMO | Admitting: Orthopaedic Surgery

## 2021-10-22 DIAGNOSIS — Z96642 Presence of left artificial hip joint: Secondary | ICD-10-CM
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
## 2021-12-12 ENCOUNTER — Emergency Department (HOSPITAL_COMMUNITY): Payer: Medicare HMO

## 2021-12-12 ENCOUNTER — Encounter (HOSPITAL_COMMUNITY): Payer: Self-pay

## 2021-12-12 ENCOUNTER — Other Ambulatory Visit: Payer: Self-pay

## 2021-12-12 ENCOUNTER — Emergency Department (HOSPITAL_COMMUNITY)
Admission: EM | Admit: 2021-12-12 | Discharge: 2021-12-13 | Disposition: A | Discharge status: Home / Self Care | Payer: Medicare HMO | Attending: Emergency Medicine | Admitting: Emergency Medicine

## 2021-12-12 DIAGNOSIS — R531 Weakness: Secondary | ICD-10-CM | POA: Diagnosis not present

## 2021-12-12 DIAGNOSIS — T426X1A Poisoning by other antiepileptic and sedative-hypnotic drugs, accidental (unintentional), initial encounter: Secondary | ICD-10-CM | POA: Insufficient documentation

## 2021-12-12 DIAGNOSIS — T402X1A Poisoning by other opioids, accidental (unintentional), initial encounter: Secondary | ICD-10-CM | POA: Diagnosis not present

## 2021-12-12 DIAGNOSIS — T391X1A Poisoning by 4-Aminophenol derivatives, accidental (unintentional), initial encounter: Secondary | ICD-10-CM | POA: Diagnosis not present

## 2021-12-12 DIAGNOSIS — R400 Somnolence: Secondary | ICD-10-CM | POA: Insufficient documentation

## 2021-12-12 DIAGNOSIS — T887XXA Unspecified adverse effect of drug or medicament, initial encounter: Secondary | ICD-10-CM

## 2021-12-12 DIAGNOSIS — Z7982 Long term (current) use of aspirin: Secondary | ICD-10-CM | POA: Insufficient documentation

## 2021-12-12 NOTE — ED Notes (Signed)
ED Provider at bedside. 

## 2021-12-12 NOTE — ED Notes (Signed)
Patient transported to CT 

## 2021-12-12 NOTE — ED Triage Notes (Signed)
Pt bib [facility] after taking 3 of Ambien to sleep because "one doesn't work". Wife reported increased drowsiness and pt fell multiple times, no LOC, pt did not hit head. VSS, pt drowsy but arousable.  ?

## 2021-12-13 ENCOUNTER — Emergency Department (HOSPITAL_COMMUNITY): Payer: Medicare HMO

## 2021-12-13 NOTE — ED Notes (Signed)
Pt ambulated to bathroom with assistance.

## 2021-12-13 NOTE — ED Provider Notes (Signed)
?[place] ?Provider Note ? ? ?CSN: [id number] ?Arrival date & time: 12/12/21  2114 ? ?  ? ?History ? ?Chief Complaint  ?Patient presents with  ? Drug Overdose  ? ? ?Philip Bates is a 61 y.o. male. ? ?61 year old male who presents to the emergency department today secondary to drowsiness and sleepiness.  His wife is with him and states he has a history of cerebral palsy and has difficulty sleeping.  He often takes Ambien at night And is only supposed to take 1, tonight he took 2 Percocet and 3 Ambien and he was having trouble getting around because he has balance issues at baseline compounded by the increased medications.  He states this worried her and he was not acting himself so she called EMS.  Reportedly vitals were fine with them.  No other issues.  Patient is able to answer questions and open his eyes spontaneously.  Patient states he does not really hurt anywhere but apparently told EMS earlier that he hurt in his right hip.  Unsure if he hit his head or not. ? ? ?Drug Overdose ? ? ?  ? ?Home Medications ?Prior to Admission medications   ?Medication Sig Start Date End Date Taking? Authorizing Provider  ?amitriptyline (ELAVIL) 25 MG tablet TAKE 1 TABLET BY MOUTH EVERYDAY AT BEDTIME 09/13/21   Martin[place], Betty G, MD  ?aspirin EC 81 MG tablet Take 1 tablet (81 mg total) by mouth 2 (two) times daily. 09/10/20   Aundra Dubin, PA-C  ?ergocalciferol (VITAMIN D2) 1.25 MG (50000 UT) capsule 1 (ONE) CAPSULE CAPSULE BY MOUTH ONCE A WEEK 12/06/20   [provider]  ?gabapentin (NEURONTIN) 300 MG capsule Take 1 capsule (300 mg total) by mouth 3 (three) times daily. 08/13/18   Newman Pies, MD  ?Multiple Vitamin (MULTIVITAMIN WITH MINERALS) TABS tablet Take 1 tablet by mouth 3 (three) times a week.    [provider]  ?NARCAN 4 MG/0.1ML LIQD nasal spray kit Place 1 spray into the nose as needed (opioid overdose). 11/30/18   [provider]   ?oxyCODONE-acetaminophen (PERCOCET) 5-325 MG tablet Take 1-2 tablets by mouth every 8 (eight) hours as needed for severe pain. ?Patient taking differently: Take 10-325 tablets by mouth every 8 (eight) hours as needed for severe pain. 10/03/20   Aundra Dubin, PA-C  ?sildenafil (REVATIO) 20 MG tablet TAKE 2 TO 3 TABLETS BY MOUTH ONCE DAILY AS NEEDED 10/10/21   Martinique, Betty G, MD  ?tamsulosin (FLOMAX) 0.4 MG CAPS capsule TAKE 1 CAPSULE BY MOUTH EVERY DAY 09/09/21   [place], Betty G, MD  ?   ? ?Allergies    ?Patient has no active allergies.   ? ?Review of Systems   ?Review of Systems ? ?Physical Exam ?Updated Vital Signs ?BP (!) 144/99 (BP Location: Left Arm)   Pulse 65   Temp 99.2 ?F (37.3 ?C) (Oral)   Resp 15   Ht _0  (1.626 m)   Wt 53.1 kg   SpO2 99%   BMI 20.08 kg/m?  ?Physical Exam ?Vitals and nursing note reviewed.  ?Constitutional:   ?   Appearance: He is well-developed.  ?HENT:  ?   Head: Normocephalic and atraumatic.  ?   Mouth/Throat:  ?   Mouth: Mucous membranes are moist.  ?   Pharynx: Oropharynx is clear.  ?Eyes:  ?   Pupils: Pupils are equal, round, and reactive to light.  ?Cardiovascular:  ?   Rate and Rhythm: Normal rate.  ?  Pulmonary:  ?   Effort: Pulmonary effort is normal. No respiratory distress.  ?Abdominal:  ?   General: Abdomen is flat. There is no distension.  ?Musculoskeletal:     ?   General: No tenderness. Normal range of motion.  ?   Cervical back: Normal range of motion.  ?   Comments: Almost no muscle mass on lower extremities  ?Neurological:  ?   General: No focal deficit present.  ?   Mental Status: He is alert and oriented to person, place, and time.  ?   Cranial Nerves: No cranial nerve deficit.  ?   Motor: Weakness (Generalized, nothing focal) present.  ? ? ?ED Results / Procedures / Treatments   ?Labs ?(all labs ordered are listed, but only abnormal results are displayed) ?Labs Reviewed - No data to display ? ?EKG ?None ? ?Radiology ?DG Pelvis 1-2 Views ? ?Result Date:  12/13/2021 ?CLINICAL DATA:  Fall injury, left hip pain. EXAM: PELVIS - 1-2 VIEW COMPARISON:  Standing AP pelvis 02/22/2021 FINDINGS: No evidence of pelvic fracture or diastasis. Deformity in the lower left hemipelvis is again noted consistent with remote trauma with left hip arthroplasty without evidence of perihardware lucencies or periprosthetic fractures. The bone mineralization is normal. Right hip is unremarkable. L5-S1 posterior fusion hardware is again noted and postsurgical changes superimposing over pelvis on both sides. IMPRESSION: Postsurgical and other chronic findings. No AP evidence of fractures. Electronically Signed   By: Telford Nab M.D.   On: 12/13/2021 00:34  ? ?CT Head Wo Contrast ? ?Result Date: 12/12/2021 ?CLINICAL DATA:  Head trauma, moderate-severe EXAM: CT HEAD WITHOUT CONTRAST TECHNIQUE: Contiguous axial images were obtained from the base of the skull through the vertex without intravenous contrast. RADIATION DOSE REDUCTION: This exam was performed according to the departmental dose-optimization program which includes automated exposure control, adjustment of the mA and/or kV according to patient size and/or use of iterative reconstruction technique. COMPARISON:  08/13/2020 FINDINGS: Brain: No intracranial hemorrhage, mass effect, or midline shift. No hydrocephalus. The basilar cisterns are patent. No evidence of territorial infarct or acute ischemia. No extra-axial or intracranial fluid collection. Vascular: No hyperdense vessel or unexpected calcification. Skull: No fracture or focal lesion. Sinuses/Orbits: Remote right zygomatic arch fracture. No acute findings. Other: No confluent scalp hematoma. IMPRESSION: 1. No acute intracranial abnormality. No skull fracture. 2. Remote right zygomatic arch fracture. Electronically Signed   By: Keith Rake M.D.   On: 12/12/2021 23:58   ? ?Procedures ?Procedures  ? ? ?Medications Ordered in ED ?Medications - No data to display ? ?ED Course/  Medical Decision Making/ A&P ?  ?                        ?Medical Decision Making ?Amount and/or Complexity of Data Reviewed ?Radiology: ordered. ?ECG/medicine tests: ordered. ? ? ?Suspect patient decreased mental status is more from an acute medication effect rather than an overdose necessarily.  As he is acting abnormal and had falls without knowing for sure if he hit his head we will get a CT to ensure there is no bleed or other abnormalities there.  Get a pelvis x-ray to make sure no hip fractures.  Otherwise will monitor patient until patient's wife feels like he is close to baseline and stable for discharge ? ?Workup reassuring. Walking at baseline (per patient and wife). Discussed appropriate use of medications. Stable for discharge with wife.  ?  ?Final Clinical Impression(s) / ED Diagnoses ?Final diagnoses:  ?  Medication side effect  ? ? ?Rx / DC Orders ?ED Discharge Orders   ? ? None  ? ?  ? ? ?  ?Merrily Pew, MD ?12/14/21 0251 ? ?

## 2021-12-13 NOTE — ED Notes (Signed)
RN reviewed discharge instructions with pt. Pt verbalized understanding and had no further questions. VSS upon discharge.  

## 2022-01-26 ENCOUNTER — Other Ambulatory Visit: Payer: Self-pay | Admitting: Family Medicine

## 2022-01-26 DIAGNOSIS — N529 Male erectile dysfunction, unspecified: Secondary | ICD-10-CM

## 2022-02-18 ENCOUNTER — Encounter: Payer: Self-pay | Admitting: Orthopaedic Surgery

## 2022-02-18 ENCOUNTER — Ambulatory Visit: Payer: Medicare HMO | Admitting: Orthopaedic Surgery

## 2022-02-18 DIAGNOSIS — Z96642 Presence of left artificial hip joint: Secondary | ICD-10-CM
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
## 2022-02-24 ENCOUNTER — Other Ambulatory Visit: Payer: Self-pay | Admitting: Family Medicine

## 2022-02-24 DIAGNOSIS — N529 Male erectile dysfunction, unspecified: Secondary | ICD-10-CM

## 2022-02-25 ENCOUNTER — Other Ambulatory Visit: Payer: Self-pay | Admitting: Family Medicine

## 2022-02-25 DIAGNOSIS — N529 Male erectile dysfunction, unspecified: Secondary | ICD-10-CM

## 2022-03-17 ENCOUNTER — Ambulatory Visit (INDEPENDENT_AMBULATORY_CARE_PROVIDER_SITE_OTHER): Payer: Medicare HMO | Admitting: Family Medicine

## 2022-03-17 ENCOUNTER — Encounter: Payer: Self-pay | Admitting: Family Medicine

## 2022-03-17 VITALS — BP 128/80 | HR 65 | Temp 98.5°F | Resp 16 | Ht 64.0 in | Wt 114.1 lb

## 2022-03-17 DIAGNOSIS — R7303 Prediabetes: Secondary | ICD-10-CM | POA: Diagnosis not present

## 2022-03-17 DIAGNOSIS — G809 Cerebral palsy, unspecified: Secondary | ICD-10-CM | POA: Diagnosis not present

## 2022-03-17 DIAGNOSIS — R351 Nocturia: Secondary | ICD-10-CM | POA: Diagnosis not present

## 2022-03-17 DIAGNOSIS — G894 Chronic pain syndrome: Secondary | ICD-10-CM | POA: Diagnosis not present

## 2022-03-17 DIAGNOSIS — N401 Enlarged prostate with lower urinary tract symptoms: Secondary | ICD-10-CM

## 2022-03-17 NOTE — Patient Instructions (Signed)
A few things to remember from today's visit:   Chronic pain disorder - Plan: Ambulatory referral to Pain Clinic  Nocturia - Plan: Urinalysis with Culture Reflex  Cerebral palsy, unspecified type (HCC), Chronic - Plan: Ambulatory referral to Pain Clinic  If you need refills please call your pharmacy. Do not use My Chart to request refills or for acute issues that need immediate attention.   Urology referral will be considered depending on urine test.  Please be sure medication list is accurate. If a new problem present, please set up appointment sooner than planned today.

## 2022-03-17 NOTE — Assessment & Plan Note (Signed)
This probably could be contributing to his urinary frequency. Flomax is not helping. We will consider urology referral if UA is negative.

## 2022-03-17 NOTE — Assessment & Plan Note (Signed)
S/P orthopedic surgeries with residual pain.

## 2022-03-17 NOTE — Assessment & Plan Note (Signed)
He is currently on chronic opioid treatment and following with pain management at [facility]. He is not satisfied with care he is receiving, so he would like to establish care with his wife's pain management provider. Referral for med rehab/pain management placed.

## 2022-03-17 NOTE — Assessment & Plan Note (Addendum)
Hemoglobin A1c recently checked, he will send a copy of lab result. A healthy lifestyle encouraged for diabetes prevention.

## 2022-03-17 NOTE — Progress Notes (Signed)
ACUTE VISIT Chief Complaint  Patient presents with   Referral    Currently seeing [facility], would like to transfer to [facility]   HPI: Philip Bates is a 61 y.o. male, who is here today with his wife requesting referral to pain management. Hx of CP, has undergone several ortho surgeries.  He has worsening left hip pain even after total hip replacement. He also has upper and lower back pain. Cervical pain affects his sleep. Denies hx of depression but "sometimes" he feels "depressed" because of limitations of daily activities due to pain.  He is following with pain management at [facility] but he would like to see a different provider. He would like same practice his wife is seeing, Dr Ranell Patrick.  He is on Percocet 10-325 mg every 6 hours as needed. Insomnia on Ambien 10 mg daily.  Today he is also complaining of urinary frequency and nocturia x 2-3. Probably has been going on for a while, at least 3 years.  Urinary Frequency  This is a chronic problem. The current episode started more than 1 year ago. The problem has been unchanged. There has been no fever. He is Sexually active. There is No history of pyelonephritis. Associated symptoms include frequency, hesitancy and urgency. Pertinent negatives include no chills, discharge, flank pain, hematuria, nausea or vomiting. There is no history of recurrent UTIs.   Hx of BPH. "Strong" urine. Cranberry juice yesterday and seem to help with odorous urine. Occasionally burning sensation. Flomax 0.4 mg daily is not helping.  Denies gross hematuria or decreased urine output.  Lab Results  Component Value Date   PSA 1.54 06/12/2021   PSA 2.57 12/14/2018   Lab Results  Component Value Date   CREATININE 0.86 04/30/2021   BUN 11 04/30/2021   NA 140 04/30/2021   K 3.8 04/30/2021   CL 104 04/30/2021   CO2 30 04/30/2021   He has HgA1C done a month ago at his pain manager's office, does not recall number  but he was told he was "prediabetic." Negative for abdominal pain, nausea,vomiting, polydipsia,polyuria, or polyphagia.  Lab Results  Component Value Date   HGBA1C 6.1 04/30/2021   Review of Systems  Constitutional:  Positive for fatigue. Negative for chills.  Respiratory:  Negative for cough, shortness of breath and wheezing.   Cardiovascular:  Negative for chest pain and leg swelling.  Gastrointestinal:  Negative for nausea and vomiting.  Genitourinary:  Positive for frequency, hesitancy and urgency. Negative for flank pain and hematuria.  Musculoskeletal:  Positive for arthralgias, back pain, gait problem and neck pain.  Neurological:  Negative for syncope.  Rest see pertinent positives and negatives per HPI.  Current Outpatient Medications on File Prior to Visit  Medication Sig Dispense Refill   amitriptyline (ELAVIL) 25 MG tablet TAKE 1 TABLET BY MOUTH EVERYDAY AT BEDTIME 90 tablet 1   aspirin EC 81 MG tablet Take 1 tablet (81 mg total) by mouth 2 (two) times daily. 84 tablet 0   ergocalciferol (VITAMIN D2) 1.25 MG (50000 UT) capsule 1 (ONE) CAPSULE CAPSULE BY MOUTH ONCE A WEEK     gabapentin (NEURONTIN) 300 MG capsule Take 1 capsule (300 mg total) by mouth 3 (three) times daily. 90 capsule 0   Multiple Vitamin (MULTIVITAMIN WITH MINERALS) TABS tablet Take 1 tablet by mouth 3 (three) times a week.     NARCAN 4 MG/0.1ML LIQD nasal spray kit Place 1 spray into the nose as needed (opioid overdose).  oxyCODONE-acetaminophen (PERCOCET) 10-325 MG tablet Take 1 tablet by mouth 4 (four) times daily as needed.     sildenafil (REVATIO) 20 MG tablet TAKE 2 TO 3 TABLETS BY MOUTH ONCE DAILY AS NEEDED 90 tablet 0   tamsulosin (FLOMAX) 0.4 MG CAPS capsule TAKE 1 CAPSULE BY MOUTH EVERY DAY 90 capsule 1   zolpidem (AMBIEN) 10 MG tablet Take 10 mg by mouth at bedtime.     No current facility-administered medications on file prior to visit.   Past Medical History:  Diagnosis Date   Arthritis     Cerebral palsy ([place])    Cervical radiculopathy    H/O umbilical hernia repair 7124   Pre-diabetes    Scoliosis    Spondylosis of cervical spine    No Active Allergies  Social History   Socioeconomic History   Marital status: Married    Spouse name: Not on file   Number of children: Not on file   Years of education: Not on file   Highest education level: Not on file  Occupational History   Not on file  Tobacco Use   Smoking status: Every Day    Packs/day: 1.00    Years: 35.00    Total pack years: 35.00    Types: Cigarettes   Smokeless tobacco: Never  Vaping Use   Vaping Use: Never used  Substance and Sexual Activity   Alcohol use: Never   Drug use: Never   Sexual activity: Yes  Other Topics Concern   Not on file  Social History Narrative   ** Merged History Encounter **       Social Determinants of Health   Financial Resource Strain: Medium Risk (04/09/2021)   Overall Financial Resource Strain (CARDIA)    Difficulty of Paying Living Expenses: Somewhat hard  Food Insecurity: No Food Insecurity (04/09/2021)   Hunger Vital Sign    Worried About Running Out of Food in the Last Year: Never true    Ran Out of Food in the Last Year: Never true  Transportation Needs: No Transportation Needs (04/09/2021)   PRAPARE - Hydrologist (Medical): No    Lack of Transportation (Non-Medical): No  Physical Activity: Insufficiently Active (04/09/2021)   Exercise Vital Sign    Days of Exercise per Week: 3 days    Minutes of Exercise per Session: 30 min  Stress: Stress Concern Present (04/09/2021)   [place]    Feeling of Stress : To some extent  Social Connections: Moderately Isolated (04/09/2021)   Social Connection and Isolation Panel [NHANES]    Frequency of Communication with Friends and Family: More than three times a week    Frequency of Social Gatherings with Friends and Family: Once a  week    Attends Religious Services: Never    Marine scientist or Organizations: No    Attends Archivist Meetings: Never    Marital Status: Married   Vitals:   03/17/22 0953  BP: 128/80  Pulse: 65  Resp: 16  Temp: 98.5 F (36.9 C)  SpO2: 97%   Body mass index is 19.59 kg/m.  Physical Exam Vitals and nursing note reviewed.  Constitutional:      General: He is not in acute distress.    Appearance: He is well-developed.  HENT:     Head: Normocephalic and atraumatic.  Eyes:     Conjunctiva/sclera: Conjunctivae normal.  Cardiovascular:     Rate and Rhythm:  Normal rate and regular rhythm.     Heart sounds: No murmur heard. Pulmonary:     Effort: Pulmonary effort is normal. No respiratory distress.     Breath sounds: Normal breath sounds.  Abdominal:     Palpations: Abdomen is soft. There is no hepatomegaly or mass.     Tenderness: There is no abdominal tenderness. There is no right CVA tenderness or left CVA tenderness.  Musculoskeletal:     Comments: LE contractures, leg length discrepancy,and limping.  Lymphadenopathy:     Cervical: No cervical adenopathy.  Skin:    General: Skin is warm.     Findings: No erythema or rash.  Neurological:     Mental Status: He is alert and oriented to person, place, and time.     Cranial Nerves: No cranial nerve deficit.     Comments: Antalgic gait, unstable,assisted by a cane.  Psychiatric:     Comments: Well groomed, good eye contact.   ASSESSMENT AND PLAN:  Mr.Plez was seen today for referral.  Diagnoses and all orders for this visit: Orders Placed This Encounter  Procedures   Urinalysis with Culture Reflex   Ambulatory referral to Pain Clinic   Nocturia And urinary frequency. We discussed possible etiologies. History of BPH but Flomax 0.4 mg is not helping. Recommend trying to avoid fluid intake 3 to 4 hours before bedtime. UA ordered today, if negative urology referral will be placed.  Cerebral  palsy (HCC) S/P orthopedic surgeries with residual pain.  Prediabetes Hemoglobin A1c recently checked, he will send a copy of lab result. A healthy lifestyle encouraged for diabetes prevention.  BPH associated with nocturia This probably could be contributing to his urinary frequency. Flomax is not helping. We will consider urology referral if UA is negative.  Chronic pain disorder He is currently on chronic opioid treatment and following with pain management at [facility]. He is not satisfied with care he is receiving, so he would like to establish care with his wife's pain management provider. Referral for med rehab/pain management placed.  I spent a total of 35 minutes in both face to face and non face to face activities for this visit on the date of this encounter. During this time history was obtained and documented, examination was performed, prior labs reviewed, and assessment/plan discussed. Return if symptoms worsen or fail to improve.  Brissia Delisa G. Martin[place], MD  [facility]. [place] office.

## 2022-03-18 ENCOUNTER — Other Ambulatory Visit: Payer: Self-pay | Admitting: Family Medicine

## 2022-03-18 DIAGNOSIS — N529 Male erectile dysfunction, unspecified: Secondary | ICD-10-CM

## 2022-03-20 ENCOUNTER — Other Ambulatory Visit (HOSPITAL_COMMUNITY): Payer: Self-pay

## 2022-03-20 LAB — URINALYSIS W MICROSCOPIC + REFLEX CULTURE

## 2022-03-31 ENCOUNTER — Encounter: Payer: Self-pay | Admitting: Physical Medicine and Rehabilitation

## 2022-04-16 ENCOUNTER — Ambulatory Visit: Payer: Medicare HMO

## 2022-04-16 ENCOUNTER — Ambulatory Visit (INDEPENDENT_AMBULATORY_CARE_PROVIDER_SITE_OTHER): Payer: Medicare HMO

## 2022-04-16 VITALS — BP 118/62 | HR 57 | Temp 98.7°F | Ht 64.0 in | Wt 121.2 lb

## 2022-04-16 DIAGNOSIS — Z0000 Encounter for general adult medical examination without abnormal findings: Secondary | ICD-10-CM

## 2022-04-16 NOTE — Patient Instructions (Addendum)
[name] , Thank you for taking time to come for your Medicare Wellness Visit. I appreciate your ongoing commitment to your health goals. Please review the following plan we discussed and let me know if I can assist you in the future.   These are the goals we discussed:  Goals       Increase physical activity (pt-stated)      Patient Stated      Go back to school        This is a list of the screening recommended for you and due dates:  Health Maintenance  Topic Date Due   Hepatitis C Screening: USPSTF Recommendation to screen - Ages 51-79 yo.  Never done   COVID-19 Vaccine (4 - Booster for Pfizer series) 05/02/2022*   Flu Shot  05/06/2022   Colon Cancer Screening  08/13/2030   Tetanus Vaccine  08/14/2030   HIV Screening  Completed   HPV Vaccine  Aged Out   Zoster (Shingles) Vaccine  Discontinued  *Topic was postponed. The date shown is not the original due date.    Opioid Pain Medicine Management Opioids are powerful medicines that are used to treat moderate to severe pain. When used for short periods of time, they can help you to: Sleep better. Do better in physical or occupational therapy. Feel better in the first few days after an injury. Recover from surgery. Opioids should be taken with the supervision of a trained health care provider. They should be taken for the shortest period of time possible. This is because opioids can be addictive, and the longer you take opioids, the greater your risk of addiction. This addiction can also be called opioid use disorder. What are the risks? Using opioid pain medicines for longer than 3 days increases your risk of side effects. Side effects include: Constipation. Nausea and vomiting. Breathing difficulties (respiratory depression). Drowsiness. Confusion. Opioid use disorder. Itching. Taking opioid pain medicine for a long period of time can affect your ability to do daily tasks. It also puts you at risk for: Motor vehicle  crashes. Depression. Suicide. Heart attack. Overdose, which can be life-threatening. What is a pain treatment plan? A pain treatment plan is an agreement between you and your health care provider. Pain is unique to each person, and treatments vary depending on your condition. To manage your pain, you and your health care provider need to work together. To help you do this: Discuss the goals of your treatment, including how much pain you might expect to have and how you will manage the pain. Review the risks and benefits of taking opioid medicines. Remember that a good treatment plan uses more than one approach and minimizes the chance of side effects. Be honest about the amount of medicines you take and about any drug or alcohol use. Get pain medicine prescriptions from only one health care provider. Pain can be managed with many types of alternative treatments. Ask your health care provider to refer you to one or more specialists who can help you manage pain through: Physical or occupational therapy. Counseling (cognitive behavioral therapy). Good nutrition. Biofeedback. Massage. Meditation. Non-opioid medicine. Following a gentle exercise program. How to use opioid pain medicine Taking medicine Take your pain medicine exactly as told by your health care provider. Take it only when you need it. If your pain gets less severe, you may take less than your prescribed dose if your health care provider approves. If you are not having pain, do nottake pain medicine unless  your health care provider tells you to take it. If your pain is severe, do nottry to treat it yourself by taking more pills than instructed on your prescription. Contact your health care provider for help. Write down the times when you take your pain medicine. It is easy to become confused while on pain medicine. Writing the time can help you avoid overdose. Take other over-the-counter or prescription medicines only as told by  your health care provider. Keeping yourself and others safe  While you are taking opioid pain medicine: Do not drive, use machinery, or power tools. Do not sign legal documents. Do not drink alcohol. Do not take sleeping pills. Do not supervise children by yourself. Do not do activities that require climbing or being in high places. Do not go to a lake, river, ocean, spa, or swimming pool. Do not share your pain medicine with anyone. Keep pain medicine in a locked cabinet or in a secure area where pets and children cannot reach it. Stopping your use of opioids If you have been taking opioid medicine for more than a few weeks, you may need to slowly decrease (taper) how much you take until you stop completely. Tapering your use of opioids can decrease your risk of symptoms of withdrawal, such as: Pain and cramping in the abdomen. Nausea. Sweating. Sleepiness. Restlessness. Uncontrollable shaking (tremors). Cravings for the medicine. Do not attempt to taper your use of opioids on your own. Talk with your health care provider about how to do this. Your health care provider may prescribe a step-down schedule based on how much medicine you are taking and how long you have been taking it. Getting rid of leftover pills Do not save any leftover pills. Get rid of leftover pills safely by: Taking the medicine to a prescription take-back program. This is usually offered by the county or law enforcement. Bringing them to a pharmacy that has a drug disposal container. Flushing them down the toilet. Check the label or package insert of your medicine to see whether this is safe to do. Throwing them out in the trash. Check the label or package insert of your medicine to see whether this is safe to do. If it is safe to throw it out, remove the medicine from the original container, put it into a sealable bag or container, and mix it with used coffee grounds, food scraps, dirt, or cat litter before putting  it in the trash. Follow these instructions at home: Activity Do exercises as told by your health care provider. Avoid activities that make your pain worse. Return to your normal activities as told by your health care provider. Ask your health care provider what activities are safe for you. General instructions You may need to take these actions to prevent or treat constipation: Drink enough fluid to keep your urine pale yellow. Take over-the-counter or prescription medicines. Eat foods that are high in fiber, such as beans, whole grains, and fresh fruits and vegetables. Limit foods that are high in fat and processed sugars, such as fried or sweet foods. Keep all follow-up visits. This is important. Where to find support If you have been taking opioids for a long time, you may benefit from receiving support for quitting from a local support group or counselor. Ask your health care provider for a referral to these resources in your area. Where to find more information Centers for Disease Control and Prevention (CDC): FootballExhibition.com.br [place] Food and Drug Administration (FDA): PumpkinSearch.com.ee Get help right away if:  You may have taken too much of an opioid (overdosed). Common symptoms of an overdose: Your breathing is slower or more shallow than normal. You have a very slow heartbeat (pulse). You have slurred speech. You have nausea and vomiting. Your pupils become very small. You have other potential symptoms: You are very confused. You faint or feel like you will faint. You have cold, clammy skin. You have blue lips or fingernails. You have thoughts of harming yourself or harming others. These symptoms may represent a serious problem that is an emergency. Do not wait to see if the symptoms will go away. Get medical help right away. Call your local emergency services (911 in the [place] Do not drive yourself to the hospital.  If you ever feel like you may hurt yourself or others, or have thoughts  about taking your own life, get help right away. Go to your nearest emergency department or: Call your local emergency services (911 in the [place] Call the [facility] ((978) 464-6834 in the [place] Call a suicide crisis helpline, such as the National Suicide Prevention Lifeline at 701-519-5404 or 988 in the [place] This is open 24 hours a day in the [place] Text the Crisis Text Line at 713-849-6948 (in the [place] Summary Opioid medicines can help you manage moderate to severe pain for a short period of time. A pain treatment plan is an agreement between you and your health care provider. Discuss the goals of your treatment, including how much pain you might expect to have and how you will manage the pain. If you think that you or someone else may have taken too much of an opioid, get medical help right away. This information is not intended to replace advice given to you by your health care provider. Make sure you discuss any questions you have with your health care provider. Document Revised: 04/17/2021 Document Reviewed: 01/02/2021 Elsevier Patient Education  2023 [employer] Inc[employer]    Advanced directives: No  Conditions/risks identified: None  Next appointment: Follow up in one year for your annual wellness visit    Preventive Care 40-64 Years, Male Preventive care refers to lifestyle choices and visits with your health care provider that can promote health and wellness. What does preventive care include? A yearly physical exam. This is also called an annual well check. Dental exams once or twice a year. Routine eye exams. Ask your health care provider how often you should have your eyes checked. Personal lifestyle choices, including: Daily care of your teeth and gums. Regular physical activity. Eating a healthy diet. Avoiding tobacco and drug use. Limiting alcohol use. Practicing safe sex. Taking low-dose aspirin every day starting at age 88. What happens during an annual  well check? The services and screenings done by your health care provider during your annual well check will depend on your age, overall health, lifestyle risk factors, and family history of disease. Counseling  Your health care provider may ask you questions about your: Alcohol use. Tobacco use. Drug use. Emotional well-being. Home and relationship well-being. Sexual activity. Eating habits. Work and work Astronomer. Screening  You may have the following tests or measurements: Height, weight, and BMI. Blood pressure. Lipid and cholesterol levels. These may be checked every 5 years, or more frequently if you are over 68 years old. Skin check. Lung cancer screening. You may have this screening every year starting at age 60 if you have a 30-pack-year history of smoking and currently smoke or have quit within the past 15  years. Fecal occult blood test (FOBT) of the stool. You may have this test every year starting at age 81. Flexible sigmoidoscopy or colonoscopy. You may have a sigmoidoscopy every 5 years or a colonoscopy every 10 years starting at age 74. Prostate cancer screening. Recommendations will vary depending on your family history and other risks. Hepatitis C blood test. Hepatitis B blood test. Sexually transmitted disease (STD) testing. Diabetes screening. This is done by checking your blood sugar (glucose) after you have not eaten for a while (fasting). You may have this done every 1-3 years. Discuss your test results, treatment options, and if necessary, the need for more tests with your health care provider. Vaccines  Your health care provider may recommend certain vaccines, such as: Influenza vaccine. This is recommended every year. Tetanus, diphtheria, and acellular pertussis (Tdap, Td) vaccine. You may need a Td booster every 10 years. Zoster vaccine. You may need this after age 66. Pneumococcal 13-valent conjugate (PCV13) vaccine. You may need this if you have certain  conditions and have not been vaccinated. Pneumococcal polysaccharide (PPSV23) vaccine. You may need one or two doses if you smoke cigarettes or if you have certain conditions. Talk to your health care provider about which screenings and vaccines you need and how often you need them. This information is not intended to replace advice given to you by your health care provider. Make sure you discuss any questions you have with your health care provider. Document Released: 10/19/2015 Document Revised: 06/11/2016 Document Reviewed: 07/24/2015 Elsevier Interactive Patient Education  2017 [employer].  Fall Prevention in the Home Falls can cause injuries. They can happen to people of all ages. There are many things you can do to make your home safe and to help prevent falls. What can I do on the outside of my home? Regularly fix the edges of walkways and driveways and fix any cracks. Remove anything that might make you trip as you walk through a door, such as a raised step or threshold. Trim any bushes or trees on the path to your home. Use bright outdoor lighting. Clear any walking paths of anything that might make someone trip, such as rocks or tools. Regularly check to see if handrails are loose or broken. Make sure that both sides of any steps have handrails. Any raised decks and porches should have guardrails on the edges. Have any leaves, snow, or ice cleared regularly. Use sand or salt on walking paths during winter. Clean up any spills in your garage right away. This includes oil or grease spills. What can I do in the bathroom? Use night lights. Install grab bars by the toilet and in the tub and shower. Do not use towel bars as grab bars. Use non-skid mats or decals in the tub or shower. If you need to sit down in the shower, use a plastic, non-slip stool. Keep the floor dry. Clean up any water that spills on the floor as soon as it happens. Remove soap buildup in the tub or shower  regularly. Attach bath mats securely with double-sided non-slip rug tape. Do not have throw rugs and other things on the floor that can make you trip. What can I do in the bedroom? Use night lights. Make sure that you have a light by your bed that is easy to reach. Do not use any sheets or blankets that are too big for your bed. They should not hang down onto the floor. Have a firm chair that has side arms.  You can use this for support while you get dressed. Do not have throw rugs and other things on the floor that can make you trip. What can I do in the kitchen? Clean up any spills right away. Avoid walking on wet floors. Keep items that you use a lot in easy-to-reach places. If you need to reach something above you, use a strong step stool that has a grab bar. Keep electrical cords out of the way. Do not use floor polish or wax that makes floors slippery. If you must use wax, use non-skid floor wax. Do not have throw rugs and other things on the floor that can make you trip. What can I do with my stairs? Do not leave any items on the stairs. Make sure that there are handrails on both sides of the stairs and use them. Fix handrails that are broken or loose. Make sure that handrails are as long as the stairways. Check any carpeting to make sure that it is firmly attached to the stairs. Fix any carpet that is loose or worn. Avoid having throw rugs at the top or bottom of the stairs. If you do have throw rugs, attach them to the floor with carpet tape. Make sure that you have a light switch at the top of the stairs and the bottom of the stairs. If you do not have them, ask someone to add them for you. What else can I do to help prevent falls? Wear shoes that: Do not have high heels. Have rubber bottoms. Are comfortable and fit you well. Are closed at the toe. Do not wear sandals. If you use a stepladder: Make sure that it is fully opened. Do not climb a closed stepladder. Make sure that  both sides of the stepladder are locked into place. Ask someone to hold it for you, if possible. Clearly mark and make sure that you can see: Any grab bars or handrails. First and last steps. Where the edge of each step is. Use tools that help you move around (mobility aids) if they are needed. These include: Canes. Walkers. Scooters. Crutches. Turn on the lights when you go into a dark area. Replace any light bulbs as soon as they burn out. Set up your furniture so you have a clear path. Avoid moving your furniture around. If any of your floors are uneven, fix them. If there are any pets around you, be aware of where they are. Review your medicines with your doctor. Some medicines can make you feel dizzy. This can increase your chance of falling. Ask your doctor what other things that you can do to help prevent falls. This information is not intended to replace advice given to you by your health care provider. Make sure you discuss any questions you have with your health care provider. Document Released: 07/19/2009 Document Revised: 02/28/2016 Document Reviewed: 10/27/2014 Elsevier Interactive Patient Education  2017 [employer]

## 2022-04-16 NOTE — Progress Notes (Signed)
Subjective:   Philip Bates is a 61 y.o. male who presents for Medicare Annual/Subsequent preventive examination.  Review of Systems     Cardiac Risk Factors include: advanced age (>54mn, >>17women);smoking/ tobacco exposure;male gender     Objective:    Today's Vitals   04/16/22 1408  BP: 118/62  Pulse: (!) 57  Temp: 98.7 F (37.1 C)  TempSrc: Oral  SpO2: 95%  Weight: 121 lb 3.2 oz (55 kg)  Height: 5' 4"  (1.626 m)   Body mass index is 20.8 kg/m.     04/16/2022    2:32 PM 04/09/2021    3:28 PM 03/01/2021   11:39 AM 09/06/2020    1:11 PM 08/13/2020   11:40 PM 08/12/2018    8:01 PM 08/06/2018   11:23 AM  Advanced Directives  Does Patient Have a Medical Advance Directive? No Yes No No No Yes Yes  Type of ACytogeneticistof [place]ocky Gap[place] will  Does patient want to make changes to medical advance directive?      No - Patient declined No - Patient declined  Copy of [place]in Chart?      No - copy requested No - copy requested  Would patient like information on creating a medical advance directive? No - Patient declined Yes (MAU/Ambulatory/Procedural Areas - Information given) No - Patient declined No - Patient declined       Current Medications (verified) Outpatient Encounter Medications as of 04/16/2022  Medication Sig   amitriptyline (ELAVIL) 25 MG tablet TAKE 1 TABLET BY MOUTH EVERYDAY AT BEDTIME   aspirin EC 81 MG tablet Take 1 tablet (81 mg total) by mouth 2 (two) times daily.   ergocalciferol (VITAMIN D2) 1.25 MG (50000 UT) capsule 1 (ONE) CAPSULE CAPSULE BY MOUTH ONCE A WEEK   gabapentin (NEURONTIN) 300 MG capsule Take 1 capsule (300 mg total) by mouth 3 (three) times daily.   Multiple Vitamin (MULTIVITAMIN WITH MINERALS) TABS tablet Take 1 tablet by mouth 3 (three) times a week.   NARCAN 4 MG/0.1ML LIQD nasal spray kit Place 1 spray into the nose as needed (opioid overdose).    oxyCODONE-acetaminophen (PERCOCET) 10-325 MG tablet Take 1 tablet by mouth 4 (four) times daily as needed.   sildenafil (REVATIO) 20 MG tablet TAKE 2 TO 3 TABLETS BY MOUTH ONCE DAILY AS NEEDED   tamsulosin (FLOMAX) 0.4 MG CAPS capsule TAKE 1 CAPSULE BY MOUTH EVERY DAY   zolpidem (AMBIEN) 10 MG tablet Take 10 mg by mouth at bedtime.   No facility-administered encounter medications on file as of 04/16/2022.    Allergies (verified) Patient has no active allergies.   History: Past Medical History:  Diagnosis Date   Arthritis    [place]    Cervical radiculopathy    H/O umbilical hernia repair 28756  Pre-diabetes    Scoliosis    Spondylosis of cervical spine    Past Surgical History:  Procedure Laterality Date   ANTERIOR CERVICAL DECOMPRESSION/DISCECTOMY FUSION 4 LEVELS N/A 08/11/2017   Procedure: Cervical three to Cervical seven Anterior Cervical discectomy with fusion/plate fixation;  Surgeon: Ditty, BKevan Ny MD;  [facility]  Service: Neurosurgery;  Laterality: N/A;   BACK SURGERY     HERNIA REPAIR     inguinal hernia repair in early 2000   LEG SURGERY     POSTERIOR CERVICAL FUSION/FORAMINOTOMY N/A 08/12/2018   Procedure: POSTERIOR CERVICAL FUSION WITH LATERAL MASS  FIXATION CERVICAL THREE TO CERVICAL SEVEN;  Surgeon: Consuella Lose, MD;  Location: [place];  Service: Neurosurgery;  Laterality: N/A;   SHOULDER ARTHROSCOPY WITH ROTATOR CUFF REPAIR AND SUBACROMIAL DECOMPRESSION Right 02/02/2018   Procedure: RIGHT SHOULDER ARTHROSCOPY WITH SUBACROMIAL DECOMPRESSION, MINI-OPEN ROTATOR CUFF REPAIR;  Surgeon: Meredith Pel, MD;  Location: [facility];  Service: Orthopedics;  Laterality: Right;   SHOULDER SURGERY Right    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   Procedure: LEFT TOTAL HIP ARTHROPLASTY ANTERIOR APPROACH;  Surgeon: Leandrew Koyanagi, MD;  Location: [facility];  Service: Orthopedics;  Laterality: Left;   Family History  Problem Relation Age of Onset   Cancer  Father        esophagus   Diabetes Brother    Diabetes Paternal Uncle    Social History   Socioeconomic History   Marital status: Married    Spouse name: Not on file   Number of children: Not on file   Years of education: Not on file   Highest education level: Not on file  Occupational History   Not on file  Tobacco Use   Smoking status: Every Day    Packs/day: 1.00    Years: 35.00    Total pack years: 35.00    Types: Cigarettes   Smokeless tobacco: Never  Vaping Use   Vaping Use: Never used  Substance and Sexual Activity   Alcohol use: Never   Drug use: Never   Sexual activity: Yes  Other Topics Concern   Not on file  Social History Narrative   ** Merged History Encounter **       Social Determinants of Health   Financial Resource Strain: Low Risk  (04/16/2022)   Overall Financial Resource Strain (CARDIA)    Difficulty of Paying Living Expenses: Not hard at all  Food Insecurity: No Food Insecurity (04/16/2022)   Hunger Vital Sign    Worried About Running Out of Food in the Last Year: Never true    Ran Out of Food in the Last Year: Never true  Transportation Needs: No Transportation Needs (04/16/2022)   PRAPARE - Hydrologist (Medical): No    Lack of Transportation (Non-Medical): No  Physical Activity: Insufficiently Active (04/16/2022)   Exercise Vital Sign    Days of Exercise per Week: 2 days    Minutes of Exercise per Session: 30 min  Stress: No Stress Concern Present (04/16/2022)   [place]    Feeling of Stress : Not at all  Social Connections: [place] (04/16/2022)   Social Connection and Isolation Panel [NHANES]    Frequency of Communication with Friends and Family: More than three times a week    Frequency of Social Gatherings with Friends and Family: More than three times a week    Attends Religious Services: More than 4 times per year    Active  Member of [employer] or Organizations: Yes    Attends Music therapist: More than 4 times per year    Marital Status: Married    Tobacco Counseling Ready to quit: Yes Counseling given: Yes   Clinical Intake:  Pre-visit preparation completed: No Diabetic?  No  Interpreter Needed?: NoActivities of Daily Living    04/16/2022    2:29 PM  In your present state of health, do you have any difficulty performing the following activities:  Hearing? 0  Vision? 0  Difficulty concentrating or making decisions? 0  Walking or  climbing stairs? 0  Dressing or bathing? 0  Doing errands, shopping? 0  Preparing Food and eating ? N  Using the Toilet? N  In the past six months, have you accidently leaked urine? N  Do you have problems with loss of bowel control? N  Managing your Medications? N  Managing your Finances? N  Housekeeping or managing your Housekeeping? N    Patient Care Team: Martinique, Betty G, MD as PCP - General (Family Medicine) Martinique, Betty G, MD (Family Medicine) [place] Corliss Marcus, RN as Case Manager Leandrew Koyanagi, MD as Attending Physician (Orthopedic Surgery)  Indicate any recent Medical Services you may have received from other than [facility] providers in the past year (date may be approximate).     Assessment:   This is a routine wellness examination for Javarie.  Hearing/Vision screen Hearing Screening - Comments:: No hearing difficulty Vision Screening - Comments:: Wears reading glasses. Followed by [place] issues and exercise activities discussed: Exercise limited by: None identified   Goals Addressed               This Visit's Progress     Increase physical activity (pt-stated)         Depression Screen    04/16/2022    2:26 PM 03/17/2022    9:54 AM 04/30/2021   12:23 PM 04/09/2021    3:24 PM 06/15/2020    4:38 PM  PHQ 2/9 Scores  PHQ - 2 Score 0 5 5 1  0  PHQ- 9 Score 0 15 16      Fall Risk    04/16/2022    2:30 PM  03/17/2022    9:56 AM 04/09/2021    3:29 PM  Fall Risk   Falls in the past year? 1 0 1  Number falls in past yr: 0 0 1  Injury with Fall? 0 0 1  Comment No injury or medical attention needed    Risk for fall due to : No Fall Risks History of fall(s) Impaired mobility;Impaired balance/gait;Impaired vision  Follow up  Falls evaluation completed Falls prevention discussed    FALL RISK PREVENTION PERTAINING TO THE HOME:  Any stairs in or around the home? No  If so, are there any without handrails? No  Home free of loose throw rugs in walkways, pet beds, electrical cords, etc? Yes  Adequate lighting in your home to reduce risk of falls? Yes   ASSISTIVE DEVICES UTILIZED TO PREVENT FALLS:  Life alert? No  Use of a cane, walker or w/c? Yes  Grab bars in the bathroom? Yes  Shower chair or bench in shower? Yes  Elevated toilet seat or a handicapped toilet? Yes   TIMED UP AND GO:  Was the test performed? Yes .  Length of time to ambulate 10 feet: 5 sec.   Gait steady and fast with assistive device  Cognitive Function:        04/16/2022    2:33 PM 04/09/2021    3:31 PM  6CIT Screen  What Year? 0 points 0 points  What month? 0 points 0 points  What time? 0 points 0 points  Count back from 20 0 points 0 points  Months in reverse 0 points 0 points  Repeat phrase 0 points 0 points  Total Score 0 points 0 points    Immunizations Immunization History  Administered Date(s) Administered   Influenza,inj,Quad PF,6+ Mos 06/01/2017   PFIZER Comirnaty(Gray Top)Covid-19 Tri-Sucrose Vaccine 12/30/2019, 01/21/2020, 07/25/2020   Tdap  08/14/2020    TDAP status: Up to date  Covid-19 vaccine status: Completed vaccines  Qualifies for Shingles Vaccine? Yes   Zostavax completed Yes   Shingrix Completed?: Yes  Screening Tests Health Maintenance  Topic Date Due   Hepatitis C Screening  Never done   COVID-19 Vaccine (4 - Booster for [place] series) 05/02/2022 (Originally 09/19/2020)    INFLUENZA VACCINE  05/06/2022   COLONOSCOPY (Pts 45-77yr Insurance coverage will need to be confirmed)  08/13/2030   TETANUS/TDAP  08/14/2030   HIV Screening  Completed   HPV VACCINES  Aged Out   Zoster Vaccines- Shingrix  Discontinued    Health Maintenance  Health Maintenance Due  Topic Date Due   Hepatitis C Screening  Never done    Colorectal cancer screening: Type of screening: Colonoscopy. Completed 08/13/20. Repeat every 10 years  Lung Cancer Screening: (Low Dose CT Chest recommended if Age 59-80years, 30 pack-year currently smoking OR have quit w/in 15years.) does qualify.   Lung Cancer Screening Referral: Patient deferred  Additional Screening:  Hepatitis C Screening: does qualify; Completed Patient deferred  Vision Screening: Recommended annual ophthalmology exams for early detection of glaucoma and other disorders of the eye. Is the patient up to date with their annual eye exam?  Yes  Who is the provider or what is the name of the office in which the patient attends annual eye exams? [email or web address] not established with a provider, would they like to be referred to a provider to establish care? No .   Dental Screening: Recommended annual dental exams for proper oral hygiene  Community Resource Referral / Chronic Care Management:  CRR required this visit?  No   CCM required this visit?  No      Plan:     I have personally reviewed and noted the following in the patient's chart:   Medical and social history Use of alcohol, tobacco or illicit drugs  Current medications and supplements including opioid prescriptions. Patient is currently taking opioid prescriptions. Information provided to patient regarding non-opioid alternatives. Patient advised to discuss non-opioid treatment plan with their provider. Functional ability and status Nutritional status Physical activity Advanced directives List of other physicians Hospitalizations, surgeries, and  ER visits in previous 12 months Vitals Screenings to include cognitive, depression, and falls Referrals and appointments  In addition, I have reviewed and discussed with patient certain preventive protocols, quality metrics, and best practice recommendations. A written personalized care plan for preventive services as well as general preventive health recommendations were provided to patient.     BCriselda Peaches LPN   [id number]  Nurse Notes: Patient due lab Hep-C Screening. Patient request f/u with concerns of Ultra Sound results.

## 2022-04-21 ENCOUNTER — Ambulatory Visit (INDEPENDENT_AMBULATORY_CARE_PROVIDER_SITE_OTHER): Payer: Medicare HMO | Admitting: Family Medicine

## 2022-04-21 VITALS — BP 110/62 | HR 74 | Resp 16 | Ht 64.0 in | Wt 114.5 lb

## 2022-04-21 DIAGNOSIS — I739 Peripheral vascular disease, unspecified: Secondary | ICD-10-CM | POA: Diagnosis not present

## 2022-04-21 DIAGNOSIS — R7303 Prediabetes: Secondary | ICD-10-CM | POA: Diagnosis not present

## 2022-04-21 DIAGNOSIS — Z122 Encounter for screening for malignant neoplasm of respiratory organs: Secondary | ICD-10-CM | POA: Diagnosis not present

## 2022-04-21 DIAGNOSIS — Z1159 Encounter for screening for other viral diseases: Secondary | ICD-10-CM

## 2022-04-21 NOTE — Patient Instructions (Addendum)
A few things to remember from today's visit:  PAD (peripheral artery disease) (HCC)  Screening for lung cancer - Plan: Ambulatory Referral for Lung Cancer Scre  Prediabetes  If you need refills please call your pharmacy. Do not use My Chart to request refills or for acute issues that need immediate attention.   We need a copy of neck and leg test. We are going to have fasting labs and we will need cholesterol medication. Start Aspirin 81 mg daily. Continue avoiding tobacco.  Please be sure medication list is accurate. If a new problem present, please set up appointment sooner than planned today.

## 2022-04-21 NOTE — Progress Notes (Signed)
ACUTE VISIT Chief Complaint  Patient presents with   blockage in neck   HPI: [name] is a 61 y.o. male with hx if chronic pain on opioid treatment, insomnia,cervical radiculopathy,and cerebral palsy here today reporting abnormal PAD screening. According to pt, he had test done to check for PAD in LE's and neck, he is not sure about type of studies nor indication. Tests were done at [facility], ordered by pain management. 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 He does not have copy of reports, I do not have access either.  Negative for headache,dizziness, focal neurologic deficit, or claudication like symptoms. + Smoker, has not done so in the past 4-5 days. Smoker since age 18, 1 PPD in average. Hx of prediabetes. Lab Results  Component Value Date   HGBA1C 6.1 04/30/2021   Negative for abdominal pain, nausea,vomiting, polydipsia,polyuria, or polyphagia.  Review of Systems  Constitutional:  Negative for activity change, appetite change and fever.  HENT:  Negative for mouth sores and sore throat.   Eyes:  Negative for redness and visual disturbance.  Respiratory:  Negative for cough, shortness of breath and wheezing.   Cardiovascular:  Negative for chest pain, palpitations and leg swelling.  Genitourinary:  Negative for decreased urine volume, dysuria and hematuria.  Musculoskeletal:  Positive for arthralgias, back pain, gait problem and neck pain.  Neurological:  Negative for syncope and facial asymmetry.  Rest see pertinent positives and negatives per HPI.  Current Outpatient Medications on File Prior to Visit  Medication Sig Dispense Refill   aspirin EC 81 MG tablet Take 1 tablet (81 mg total) by mouth 2 (two) times daily. 84 tablet 0   ergocalciferol (VITAMIN D2) 1.25 MG (50000 UT) capsule 1 (ONE) CAPSULE CAPSULE BY MOUTH ONCE A WEEK     gabapentin (NEURONTIN) 300 MG capsule Take 1 capsule (300 mg total) by mouth 3 (three)  times daily. 90 capsule 0   NARCAN 4 MG/0.1ML LIQD nasal spray kit Place 1 spray into the nose as needed (opioid overdose).     oxyCODONE-acetaminophen (PERCOCET) 10-325 MG tablet Take 1 tablet by mouth 4 (four) times daily as needed.     sildenafil (REVATIO) 20 MG tablet TAKE 2 TO 3 TABLETS BY MOUTH ONCE DAILY AS NEEDED 90 tablet 2   zolpidem (AMBIEN) 10 MG tablet Take 10 mg by mouth at bedtime.     No current facility-administered medications on file prior to visit.     Past Medical History:  Diagnosis Date   Arthritis    Cerebral palsy ([place])    Cervical radiculopathy    H/O umbilical hernia repair 7579   Pre-diabetes    Scoliosis    Spondylosis of cervical spine    No Active Allergies  Social History   Socioeconomic History   Marital status: Married    Spouse name: Not on file   Number of children: Not on file   Years of education: Not on file   Highest education level: Some college, no degree  Occupational History   Not on file  Tobacco Use   Smoking status: Every Day    Packs/day: 1.00    Years: 35.00    Total pack years: 35.00    Types: Cigarettes   Smokeless tobacco: Never  Vaping Use   Vaping Use: Never used  Substance and Sexual Activity   Alcohol use: Never   Drug use: Never   Sexual activity: Yes  Other Topics  Concern   Not on file  Social History Narrative   ** Merged History Encounter **       Social Determinants of Health   Financial Resource Strain: Low Risk  (04/16/2022)   Overall Financial Resource Strain (CARDIA)    Difficulty of Paying Living Expenses: Not hard at all  Food Insecurity: Food Insecurity Present (04/21/2022)   Hunger Vital Sign    Worried About Running Out of Food in the Last Year: Sometimes true    Ran Out of Food in the Last Year: Sometimes true  Transportation Needs: No Transportation Needs (04/21/2022)   PRAPARE - Hydrologist (Medical): No    Lack of Transportation (Non-Medical): No   Physical Activity: Sufficiently Active (04/21/2022)   Exercise Vital Sign    Days of Exercise per Week: 2 days    Minutes of Exercise per Session: 140 min  Recent Concern: Physical Activity - Insufficiently Active (04/16/2022)   Exercise Vital Sign    Days of Exercise per Week: 2 days    Minutes of Exercise per Session: 30 min  Stress: Stress Concern Present (04/21/2022)   [place]    Feeling of Stress : To some extent  Social Connections: Socially Integrated (04/21/2022)   Social Connection and Isolation Panel [NHANES]    Frequency of Communication with Friends and Family: Twice a week    Frequency of Social Gatherings with Friends and Family: Once a week    Attends Religious Services: More than 4 times per year    Active Member of [employer] or Organizations: No    Attends Music therapist: More than 4 times per year    Marital Status: Married   Vitals:   04/21/22 1410  BP: 110/62  Pulse: 74  Resp: 16  SpO2: 99%   Body mass index is 19.65 kg/m.  Physical Exam Vitals and nursing note reviewed.  Constitutional:      General: He is not in acute distress.    Appearance: He is well-developed.  HENT:     Head: Normocephalic and atraumatic.  Eyes:     Conjunctiva/sclera: Conjunctivae normal.  Neck:     Vascular: Carotid bruit (? Left) present.  Cardiovascular:     Rate and Rhythm: Normal rate and regular rhythm.     Pulses:          Dorsalis pedis pulses are 2+ on the right side and 2+ on the left side.       Posterior tibial pulses are 2+ on the right side and 2+ on the left side.     Heart sounds: No murmur heard. Pulmonary:     Effort: Pulmonary effort is normal. No respiratory distress.     Breath sounds: Normal breath sounds.  Abdominal:     Palpations: Abdomen is soft. There is no hepatomegaly or mass.     Tenderness: There is no abdominal tenderness.  Musculoskeletal:     Comments: LLE  atrophic changes and fixed contractures. Antalgic gait assisted by a cane.  Lymphadenopathy:     Cervical: No cervical adenopathy.  Skin:    General: Skin is warm.     Findings: No erythema or rash.  Neurological:     Mental Status: He is alert and oriented to person, place, and time.     Cranial Nerves: No cranial nerve deficit.  Psychiatric:     Comments: Well groomed, good eye contact.   ASSESSMENT AND PLAN:  [name] was seen today for blockage in neck.  Diagnoses and all orders for this visit: Orders Placed This Encounter  Procedures   Hemoglobin A1c   Hepatitis C antibody   Lipid panel   Comprehensive metabolic panel   Ambulatory Referral for Lung Cancer Scre   Prediabetes Consistency with a healthy life style encouraged for diabetes prevention. HgA1C to be done with next blood work.  PAD (peripheral artery disease) (HCC) Apparently abnormal carotid vas [place], reporting normal LE [place]. I do not have reports today. We discussed Dx, possible complications,and treatment. Daily Aspirin 81 mg and statin recommended, he prefers to hold on the latter one for now until labs are done. He will sign release form ,so we can obtain copy of repots.  He is not fasting today,just ate, so fasting labs next week.  Screening for lung cancer -     Ambulatory Referral for Lung Cancer Scre  Encounter for HCV screening test for low risk patient -     Hepatitis C antibody; Future  Return for fasting labs next week. Release form to obtain imaging done at [facility].  Philip Bates G. Martin[place], MD  [facility]. [place] office.

## 2022-04-24 ENCOUNTER — Encounter: Payer: Self-pay | Admitting: Family Medicine

## 2022-04-29 NOTE — Progress Notes (Deleted)
HPI:  Mr.Nickalaus Krass is a 61 y.o. male, who is here today to follow on recent visit.  Review of Systems Rest see pertinent positives and negatives per HPI.  Current Outpatient Medications on File Prior to Visit  Medication Sig Dispense Refill  . aspirin EC 81 MG tablet Take 1 tablet (81 mg total) by mouth 2 (two) times daily. 84 tablet 0  . ergocalciferol (VITAMIN D2) 1.25 MG (50000 UT) capsule 1 (ONE) CAPSULE CAPSULE BY MOUTH ONCE A WEEK    . gabapentin (NEURONTIN) 300 MG capsule Take 1 capsule (300 mg total) by mouth 3 (three) times daily. 90 capsule 0  . NARCAN 4 MG/0.1ML LIQD nasal spray kit Place 1 spray into the nose as needed (opioid overdose).    Marland Kitchen oxyCODONE-acetaminophen (PERCOCET) 10-325 MG tablet Take 1 tablet by mouth 4 (four) times daily as needed.    . sildenafil (REVATIO) 20 MG tablet TAKE 2 TO 3 TABLETS BY MOUTH ONCE DAILY AS NEEDED 90 tablet 2  . zolpidem (AMBIEN) 10 MG tablet Take 10 mg by mouth at bedtime.     No current facility-administered medications on file prior to visit.    Past Medical History:  Diagnosis Date  . Arthritis   . Cerebral palsy ([place])   . Cervical radiculopathy   . H/O umbilical hernia repair [id number]  . Pre-diabetes   . Scoliosis   . Spondylosis of cervical spine    No Active Allergies  Social History   Socioeconomic History  . Marital status: Married    Spouse name: Not on file  . Number of children: Not on file  . Years of education: Not on file  . Highest education level: Some college, no degree  Occupational History  . Not on file  Tobacco Use  . Smoking status: Every Day    Packs/day: 1.00    Years: 35.00    Total pack years: 35.00    Types: Cigarettes  . Smokeless tobacco: Never  Vaping Use  . Vaping Use: Never used  Substance and Sexual Activity  . Alcohol use: Never  . Drug use: Never  . Sexual activity: Yes  Other Topics Concern  . Not on file  Social History Narrative   ** Merged History Encounter **        Social Determinants of Health   Financial Resource Strain: Low Risk  (04/16/2022)   Overall Financial Resource Strain (CARDIA)   . Difficulty of Paying Living Expenses: Not hard at all  Food Insecurity: Food Insecurity Present (04/21/2022)   Hunger Vital Sign   . Worried About Charity fundraiser in the Last Year: Sometimes true   . Ran Out of Food in the Last Year: Sometimes true  Transportation Needs: No Transportation Needs (04/21/2022)   PRAPARE - Transportation   . Lack of Transportation (Medical): No   . Lack of Transportation (Non-Medical): No  Physical Activity: Sufficiently Active (04/21/2022)   Exercise Vital Sign   . Days of Exercise per Week: 2 days   . Minutes of Exercise per Session: 140 min  Recent Concern: Physical Activity - Insufficiently Active (04/16/2022)   Exercise Vital Sign   . Days of Exercise per Week: 2 days   . Minutes of Exercise per Session: 30 min  Stress: Stress Concern Present (04/21/2022)   [place]   . Feeling of Stress : To some extent  Social Connections: Socially Integrated (04/21/2022)   Social Connection and Isolation  Panel [NHANES]   . Frequency of Communication with Friends and Family: Twice a week   . Frequency of Social Gatherings with Friends and Family: Once a week   . Attends Religious Services: More than 4 times per year   . Active Member of Clubs or Organizations: No   . Attends Archivist Meetings: More than 4 times per year   . Marital Status: Married    Vitals:   04/30/22 1356  BP: 100/64  Pulse: 88  SpO2: 97%   Body mass index is 19.65 kg/m.  Physical Exam  ASSESSMENT AND PLAN:   There are no diagnoses linked to this encounter.  No orders of the defined types were placed in this encounter.   No problem-specific Assessment & Plan notes found for this encounter.   No follow-ups on file.   Betty G. [place]ique, MD  [facility]. [place] office.

## 2022-04-30 ENCOUNTER — Encounter: Payer: Self-pay | Admitting: Family Medicine

## 2022-04-30 ENCOUNTER — Encounter: Payer: Medicare HMO | Admitting: Family Medicine

## 2022-04-30 ENCOUNTER — Other Ambulatory Visit: Payer: Medicare HMO

## 2022-04-30 LAB — COMPREHENSIVE METABOLIC PANEL
ALT: 24 U/L (ref 0–53)
AST: 26 U/L (ref 0–37)
Albumin: 4.1 g/dL (ref 3.5–5.2)
Alkaline Phosphatase: 54 U/L (ref 39–117)
BUN: 13 mg/dL (ref 6–23)
CO2: 33 mEq/L — ABNORMAL HIGH (ref 19–32)
Calcium: 9.1 mg/dL (ref 8.4–10.5)
Chloride: 100 mEq/L (ref 96–112)
Creatinine, Ser: 0.65 mg/dL (ref 0.40–1.50)
GFR: 103.28 mL/min (ref >60.00)
Glucose, Bld: 97 mg/dL (ref 70–99)
Potassium: 3.4 mEq/L — ABNORMAL LOW (ref 3.5–5.1)
Sodium: 137 mEq/L (ref 135–145)
Total Bilirubin: 0.4 mg/dL (ref 0.2–1.2)
Total Protein: 6.7 g/dL (ref 6.0–8.3)

## 2022-04-30 LAB — LIPID PANEL
Cholesterol: 134 mg/dL (ref 0–200)
HDL: 47 mg/dL (ref >39.00)
LDL Cholesterol: 61 mg/dL (ref 0–99)
NonHDL: 86.53
Total CHOL/HDL Ratio: 3
Triglycerides: 130 mg/dL (ref 0.0–149.0)
VLDL: 26 mg/dL (ref 0.0–40.0)

## 2022-04-30 LAB — HEMOGLOBIN A1C: Hgb A1c MFr Bld: 6 % (ref 4.6–6.5)

## 2022-05-01 ENCOUNTER — Ambulatory Visit (INDEPENDENT_AMBULATORY_CARE_PROVIDER_SITE_OTHER): Payer: Medicare HMO | Admitting: Orthopaedic Surgery

## 2022-05-01 DIAGNOSIS — M545 Low back pain, unspecified: Secondary | ICD-10-CM

## 2022-05-01 DIAGNOSIS — G8929 Other chronic pain: Secondary | ICD-10-CM | POA: Diagnosis not present

## 2022-05-01 DIAGNOSIS — G809 Cerebral palsy, unspecified: Secondary | ICD-10-CM

## 2022-05-01 DIAGNOSIS — G5702 Lesion of sciatic nerve, left lower limb: Secondary | ICD-10-CM | POA: Diagnosis not present

## 2022-05-01 LAB — HEPATITIS C ANTIBODY: Hepatitis C Ab: NONREACTIVE

## 2022-05-01 NOTE — Addendum Note (Signed)
Addended by: Wendi Maya on: 05/01/2022 09:26 AM   Modules accepted: Orders
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
## 2022-05-21 ENCOUNTER — Inpatient Hospital Stay: Admission: RE | Admit: 2022-05-21 | Payer: Medicare HMO | Source: Ambulatory Visit

## 2022-05-29 ENCOUNTER — Ambulatory Visit: Payer: Medicare HMO | Admitting: Orthopaedic Surgery

## 2022-05-30 ENCOUNTER — Other Ambulatory Visit: Payer: Medicare HMO

## 2022-06-03 ENCOUNTER — Ambulatory Visit: Payer: Medicare HMO | Admitting: Orthopaedic Surgery

## 2022-06-04 ENCOUNTER — Encounter: Payer: Medicare HMO | Attending: Physical Medicine and Rehabilitation | Admitting: Physical Medicine and Rehabilitation

## 2022-06-23 ENCOUNTER — Other Ambulatory Visit: Payer: Self-pay | Admitting: Family Medicine

## 2022-06-23 DIAGNOSIS — N529 Male erectile dysfunction, unspecified: Secondary | ICD-10-CM

## 2022-06-24 ENCOUNTER — Ambulatory Visit: Payer: Medicare HMO | Admitting: Physical Medicine and Rehabilitation

## 2022-07-21 ENCOUNTER — Ambulatory Visit (INDEPENDENT_AMBULATORY_CARE_PROVIDER_SITE_OTHER): Payer: Medicare HMO | Admitting: Orthopaedic Surgery

## 2022-07-21 DIAGNOSIS — M25562 Pain in left knee: Secondary | ICD-10-CM | POA: Diagnosis not present

## 2022-07-21 DIAGNOSIS — M1712 Unilateral primary osteoarthritis, left knee: Secondary | ICD-10-CM | POA: Diagnosis not present

## 2022-07-21 DIAGNOSIS — G8929 Other chronic pain: Secondary | ICD-10-CM | POA: Diagnosis not present

## 2022-07-21 DIAGNOSIS — Z969 Presence of functional implant, unspecified: Secondary | ICD-10-CM

## 2022-07-21 MED ORDER — LIDOCAINE HCL 1 % IJ SOLN
3.0000 mL | INTRAMUSCULAR | Status: AC | PRN
  Administered 2022-07-21: 3 mL

## 2022-07-21 MED ORDER — METHYLPREDNISOLONE ACETATE 40 MG/ML IJ SUSP
40.0000 mg | INTRAMUSCULAR | Status: AC | PRN
  Administered 2022-07-21: 40 mg via INTRA_ARTICULAR

## 2022-07-21 NOTE — Progress Notes (Signed)
The patient is a 61 year old gentleman that I am seeing for the first time but he is actually an established patient of our office having had a hip replacement on the left side by Dr. Erlinda Hong 2 years ago.  He does have cerebral palsy and has to wear a brace along his left lower extremity secondary to muscle atrophy in the flexor cerebral palsy.  When he was in his young 85s he had surgery on his left distal femur and he has a sideplate with a DCS type of plate and screw in that femur.  He does report knee pain and swelling.  He said other physicians have said that they would not be comfortable with performing knee replacement surgery on him.  On exam he has a well-healed surgical incision laterally.  His knee range of motion i is not full but there is significant atrophy of the muscles in that leg is much smaller than his other leg.  There is significant bony prominence of all the bones around his knee.  I did review the x-rays of his knee with him showing retained hardware and the extent of osteoarthritis that he does have in his left knee with joint space narrowing.  I did recommend a steroid injection in his left knee and he agreed to this and tolerated well.  We will see if we can order hyaluronic acid for his knee.  I would not be comfortable with performing knee replacement surgery based on how I see his gait and how he walks with his cerebral palsy.  I do not have the experience as it relates to this type of knee issue.  We will see if we can order hyaluronic acid for him.

## 2022-07-21 NOTE — Progress Notes (Signed)
   Procedure Note  Patient: Philip Bates             Date of Birth: 1961/07/14           MRN: [id number]             Visit Date: 07/21/2022  Procedures: Visit Diagnoses:  1. Chronic pain of left knee   2. Retained orthopedic hardware   3. Unilateral primary osteoarthritis, left knee     Large Joint Inj: L knee on 07/21/2022 2:25 PM Indications: diagnostic evaluation and pain Details: 22 G 1.5 in needle, superolateral approach  Arthrogram: No  Medications: 3 mL lidocaine 1 %; 40 mg methylPREDNISolone acetate 40 MG/ML Outcome: tolerated well, no immediate complications Procedure, treatment alternatives, risks and benefits explained, specific risks discussed. Consent was given by the patient. Immediately prior to procedure a time out was called to verify the correct patient, procedure, equipment, support staff and site/side marked as required. Patient was prepped and draped in the usual sterile fashion.

## 2022-07-22 ENCOUNTER — Telehealth: Payer: Self-pay

## 2022-07-22 NOTE — Telephone Encounter (Signed)
Left knee gel injection ?

## 2022-07-23 NOTE — Telephone Encounter (Signed)
VOB submitted for Monovisc, left knee 

## 2022-08-04 ENCOUNTER — Other Ambulatory Visit: Payer: Self-pay | Admitting: Family Medicine

## 2022-08-04 DIAGNOSIS — N529 Male erectile dysfunction, unspecified: Secondary | ICD-10-CM

## 2022-08-18 NOTE — Progress Notes (Signed)
This encounter was created in error - please disregard.

## 2022-09-09 ENCOUNTER — Other Ambulatory Visit: Payer: Self-pay | Admitting: Family Medicine

## 2022-09-09 DIAGNOSIS — N529 Male erectile dysfunction, unspecified: Secondary | ICD-10-CM

## 2022-09-17 ENCOUNTER — Other Ambulatory Visit: Payer: Self-pay | Admitting: Internal Medicine

## 2022-09-17 DIAGNOSIS — Z122 Encounter for screening for malignant neoplasm of respiratory organs: Secondary | ICD-10-CM

## 2022-10-15 DIAGNOSIS — F17210 Nicotine dependence, cigarettes, uncomplicated: Secondary | ICD-10-CM | POA: Diagnosis not present

## 2022-10-15 DIAGNOSIS — R030 Elevated blood-pressure reading, without diagnosis of hypertension: Secondary | ICD-10-CM | POA: Diagnosis not present

## 2022-10-15 DIAGNOSIS — G4700 Insomnia, unspecified: Secondary | ICD-10-CM | POA: Diagnosis not present

## 2022-10-15 DIAGNOSIS — Z6820 Body mass index (BMI) 20.0-20.9, adult: Secondary | ICD-10-CM | POA: Diagnosis not present

## 2022-10-15 DIAGNOSIS — M4722 Other spondylosis with radiculopathy, cervical region: Secondary | ICD-10-CM | POA: Diagnosis not present

## 2022-10-15 DIAGNOSIS — Z681 Body mass index (BMI) 19 or less, adult: Secondary | ICD-10-CM | POA: Diagnosis not present

## 2022-10-15 DIAGNOSIS — Z0000 Encounter for general adult medical examination without abnormal findings: Secondary | ICD-10-CM | POA: Diagnosis not present

## 2022-10-15 DIAGNOSIS — R7309 Other abnormal glucose: Secondary | ICD-10-CM | POA: Diagnosis not present

## 2022-10-20 DIAGNOSIS — Z87891 Personal history of nicotine dependence: Secondary | ICD-10-CM | POA: Diagnosis not present

## 2022-10-28 ENCOUNTER — Other Ambulatory Visit: Payer: Medicare HMO

## 2022-11-14 DIAGNOSIS — R5383 Other fatigue: Secondary | ICD-10-CM | POA: Diagnosis not present

## 2022-11-14 DIAGNOSIS — Z79899 Other long term (current) drug therapy: Secondary | ICD-10-CM | POA: Diagnosis not present

## 2022-11-14 DIAGNOSIS — R7309 Other abnormal glucose: Secondary | ICD-10-CM | POA: Diagnosis not present

## 2022-11-14 DIAGNOSIS — F17210 Nicotine dependence, cigarettes, uncomplicated: Secondary | ICD-10-CM | POA: Diagnosis not present

## 2022-11-14 DIAGNOSIS — G4700 Insomnia, unspecified: Secondary | ICD-10-CM | POA: Diagnosis not present

## 2022-11-14 DIAGNOSIS — D539 Nutritional anemia, unspecified: Secondary | ICD-10-CM | POA: Diagnosis not present

## 2022-11-14 DIAGNOSIS — E559 Vitamin D deficiency, unspecified: Secondary | ICD-10-CM | POA: Diagnosis not present

## 2022-11-14 DIAGNOSIS — E7800 Pure hypercholesterolemia, unspecified: Secondary | ICD-10-CM | POA: Diagnosis not present

## 2022-11-14 DIAGNOSIS — Z681 Body mass index (BMI) 19 or less, adult: Secondary | ICD-10-CM | POA: Diagnosis not present

## 2022-11-14 DIAGNOSIS — Z6820 Body mass index (BMI) 20.0-20.9, adult: Secondary | ICD-10-CM | POA: Diagnosis not present

## 2022-11-14 DIAGNOSIS — M4722 Other spondylosis with radiculopathy, cervical region: Secondary | ICD-10-CM | POA: Diagnosis not present

## 2022-11-14 DIAGNOSIS — R030 Elevated blood-pressure reading, without diagnosis of hypertension: Secondary | ICD-10-CM | POA: Diagnosis not present

## 2022-12-07 ENCOUNTER — Other Ambulatory Visit: Payer: Self-pay | Admitting: Family Medicine

## 2022-12-07 DIAGNOSIS — N529 Male erectile dysfunction, unspecified: Secondary | ICD-10-CM

## 2022-12-12 DIAGNOSIS — M129 Arthropathy, unspecified: Secondary | ICD-10-CM | POA: Diagnosis not present

## 2022-12-12 DIAGNOSIS — M4722 Other spondylosis with radiculopathy, cervical region: Secondary | ICD-10-CM | POA: Diagnosis not present

## 2022-12-12 DIAGNOSIS — D539 Nutritional anemia, unspecified: Secondary | ICD-10-CM | POA: Diagnosis not present

## 2022-12-12 DIAGNOSIS — R5383 Other fatigue: Secondary | ICD-10-CM | POA: Diagnosis not present

## 2022-12-12 DIAGNOSIS — E7800 Pure hypercholesterolemia, unspecified: Secondary | ICD-10-CM | POA: Diagnosis not present

## 2022-12-12 DIAGNOSIS — Z681 Body mass index (BMI) 19 or less, adult: Secondary | ICD-10-CM | POA: Diagnosis not present

## 2022-12-12 DIAGNOSIS — R7309 Other abnormal glucose: Secondary | ICD-10-CM | POA: Diagnosis not present

## 2022-12-12 DIAGNOSIS — G4700 Insomnia, unspecified: Secondary | ICD-10-CM | POA: Diagnosis not present

## 2022-12-12 DIAGNOSIS — Z6820 Body mass index (BMI) 20.0-20.9, adult: Secondary | ICD-10-CM | POA: Diagnosis not present

## 2022-12-12 DIAGNOSIS — E559 Vitamin D deficiency, unspecified: Secondary | ICD-10-CM | POA: Diagnosis not present

## 2022-12-12 DIAGNOSIS — Z79899 Other long term (current) drug therapy: Secondary | ICD-10-CM | POA: Diagnosis not present

## 2022-12-12 DIAGNOSIS — F17210 Nicotine dependence, cigarettes, uncomplicated: Secondary | ICD-10-CM | POA: Diagnosis not present

## 2022-12-12 DIAGNOSIS — R030 Elevated blood-pressure reading, without diagnosis of hypertension: Secondary | ICD-10-CM | POA: Diagnosis not present

## 2022-12-28 ENCOUNTER — Emergency Department (HOSPITAL_COMMUNITY): Payer: 59

## 2022-12-28 ENCOUNTER — Emergency Department (HOSPITAL_COMMUNITY)
Admission: EM | Admit: 2022-12-28 | Discharge: 2022-12-28 | Disposition: A | Discharge status: Home / Self Care | Payer: 59 | Attending: Emergency Medicine | Admitting: Emergency Medicine

## 2022-12-28 ENCOUNTER — Other Ambulatory Visit: Payer: Self-pay

## 2022-12-28 DIAGNOSIS — M25552 Pain in left hip: Secondary | ICD-10-CM | POA: Diagnosis not present

## 2022-12-28 MED ORDER — MORPHINE SULFATE (PF) 4 MG/ML IV SOLN
4.0000 mg | Freq: Once | INTRAVENOUS | Status: AC
  Administered 2022-12-28: 4 mg via INTRAVENOUS
  Filled 2022-12-28: qty 1

## 2022-12-28 MED ORDER — LIDOCAINE 5 % EX PTCH: 1.0000 | MEDICATED_PATCH | CUTANEOUS | 0 refills | Status: DC

## 2022-12-28 MED ORDER — OXYCODONE-ACETAMINOPHEN 5-325 MG PO TABS
1.0000 | ORAL_TABLET | Freq: Once | ORAL | Status: AC
  Administered 2022-12-28: 1 via ORAL
  Filled 2022-12-28: qty 1

## 2022-12-28 MED ORDER — LIDOCAINE 5 % EX PTCH
1.0000 | MEDICATED_PATCH | CUTANEOUS | Status: DC
  Administered 2022-12-28: 1 via TRANSDERMAL
  Filled 2022-12-28: qty 1

## 2022-12-28 NOTE — Discharge Instructions (Addendum)
Your x-ray of your hip did not show any dislocation or fractures.  Continue taking medication at home for pain.  You are also given prescription for lidocaine patches.  Follow-up with orthopedic doctor.  Call Monday for an appointment.  Come back to the ER if you have fever, worsening pain or other worsening symptoms.

## 2022-12-28 NOTE — ED Triage Notes (Addendum)
Left hip "locking up and painful" states "It doesn't look right" started a few days ago and worse this am. Normally uses a cane, hurts too bad now  Hip replacement in 2022-- Dr. Erlinda Hong

## 2022-12-28 NOTE — ED Provider Notes (Signed)
[facility] Provider Note   CSN: [id number] Arrival date & time: 12/28/22  1348     History  Chief Complaint  Patient presents with   Hip Pain    Philip Bates is a 62 y.o. male.  History of left hip arthroplasty with continued chronic pain after this.  Presents to ER today for worsening left hip pain.  Having worsened pain for past couple of days and states has been "locked up but today it is worse and he cannot bear any weight.  He states it feels like it is out of place.  Denies trauma fevers or chills.  Is located in the left lateral hip.  No numbness or tingling.  He also has a chronic left foot drop and wears a brace for this.  There is no lower leg pain   Hip Pain       Home Medications Prior to Admission medications   Medication Sig Start Date End Date Taking? Authorizing Provider  lidocaine (LIDODERM) 5 % Place 1 patch onto the skin daily. Remove & Discard patch within 12 hours or as directed by MD 12/28/22  Yes Amedeo Gory, Morena Mckissack A, PA-C  aspirin EC 81 MG tablet Take 1 tablet (81 mg total) by mouth 2 (two) times daily. 09/10/20   Aundra Dubin, PA-C  ergocalciferol (VITAMIN D2) 1.25 MG (50000 UT) capsule 1 (ONE) CAPSULE CAPSULE BY MOUTH ONCE A WEEK 12/06/20   [provider]  gabapentin (NEURONTIN) 300 MG capsule Take 1 capsule (300 mg total) by mouth 3 (three) times daily. 08/13/18   Newman Pies, MD  [facility] 4 MG/0.1ML LIQD nasal spray kit Place 1 spray into the nose as needed (opioid overdose). 11/30/18   [provider]  oxyCODONE-acetaminophen (PERCOCET) 10-325 MG tablet Take 1 tablet by mouth 4 (four) times daily as needed. 02/13/22   [provider]  sildenafil (REVATIO) 20 MG tablet TAKE 2 TO 3 TABLETS BY MOUTH ONCE DAILY AS NEEDED 12/08/22   [place], Betty G, MD  zolpidem (AMBIEN) 10 MG tablet Take 10 mg by mouth at bedtime. 02/13/22   [provider]      Allergies    Patient has no  active allergies.    Review of Systems   Review of Systems  Physical Exam Updated Vital Signs BP 125/86   Pulse 64   Temp 98.1 F (36.7 C)   Resp 16   Ht 5\' 4"  (1.626 m)   Wt 53.1 kg   SpO2 100%   BMI 20.08 kg/m  Physical Exam Vitals and nursing note reviewed.  Constitutional:      General: He is not in acute distress.    Appearance: He is well-developed.  HENT:     Head: Normocephalic and atraumatic.  Eyes:     Conjunctiva/sclera: Conjunctivae normal.  Cardiovascular:     Rate and Rhythm: Normal rate and regular rhythm.     Heart sounds: No murmur heard. Pulmonary:     Effort: Pulmonary effort is normal. No respiratory distress.     Breath sounds: Normal breath sounds.  Abdominal:     Palpations: Abdomen is soft.     Tenderness: There is no abdominal tenderness.  Musculoskeletal:        General: No swelling.     Cervical back: Neck supple.     Comments: Patient holds the left hip in flexion, there are some soft tissue swelling over the greater trochanter with no overlying erythema or warmth.  There is no crepitus.  Well healed scar from prior arthroplasty noted.  Sensation intact throughout left leg with intact DP and PT pulses left foot.  Normal range of motion of left knee.   Skin:    General: Skin is warm and dry.     Capillary Refill: Capillary refill takes less than 2 seconds.  Neurological:     General: No focal deficit present.     Mental Status: He is alert and oriented to person, place, and time.  Psychiatric:        Mood and Affect: Mood normal.     ED Results / Procedures / Treatments   Labs (all labs ordered are listed, but only abnormal results are displayed) Labs Reviewed - No data to display  EKG None  Radiology DG Hip Unilat W or Wo Pelvis 2-3 Views Left  Result Date: 12/28/2022 CLINICAL DATA:  Left hip pain EXAM: DG HIP (WITH OR WITHOUT PELVIS) 2-3V LEFT COMPARISON:  10/22/2021 FINDINGS: Postsurgical changes from left total hip  arthroplasty. Arthroplasty components remain in their expected alignment without dislocation. No periprosthetic lucency or fracture is identified. Right hip joint intact. No pelvic fracture or diastasis. Postsurgical changes within the pelvis. IMPRESSION: Postsurgical changes from left total hip arthroplasty. No evidence of hardware complication or periprosthetic fracture. Electronically Signed   By: Davina Poke D.O.   On: 12/28/2022 15:09    Procedures Procedures    Medications Ordered in ED Medications  lidocaine (LIDODERM) 5 % 1 patch (1 patch Transdermal Patch Applied 12/28/22 1546)  morphine (PF) 4 MG/ML injection 4 mg (4 mg Intravenous Given 12/28/22 1436)  oxyCODONE-acetaminophen (PERCOCET/ROXICET) 5-325 MG per tablet 1 tablet (1 tablet Oral Given 12/28/22 1546)    ED Course/ Medical Decision Making/ A&P                             Medical Decision Making Differential diagnosis: Dislocation, fracture, bursitis, tendinitis, chronic pain syndrome, other ED course: Patient presents with left hip pain that is atraumatic.  Initially was holding it in a flexed position noticed some swelling.  X-ray was obtained, images were interpreted by me as no dislocation, no fracture.  Arthroplasty intact.  Radiology report read and is in agreement.  Patient is feeling better after pain medication.  Will send him home with lidocaine patches.  Discussed the need to follow-up with his orthopedic doctor tomorrow.  He was given strict return precautions.  Is no redness warmth or limited range of motion to suggest that there is a septic arthritis or septic bursitis.  Vitals are reassuring.  Amount and/or Complexity of Data Reviewed Radiology: ordered.  Risk Prescription drug management.           Final Clinical Impression(s) / ED Diagnoses Final diagnoses:  Left hip pain    Rx / DC Orders ED Discharge Orders          Ordered    lidocaine (LIDODERM) 5 %  Every 24 hours        12/28/22  [address] PA-C 12/28/22 [place], Dan, DO 12/28/22 1904

## 2023-01-09 ENCOUNTER — Ambulatory Visit: Payer: 59 | Admitting: Orthopaedic Surgery

## 2023-01-13 DIAGNOSIS — Z6820 Body mass index (BMI) 20.0-20.9, adult: Secondary | ICD-10-CM | POA: Diagnosis not present

## 2023-01-13 DIAGNOSIS — Z681 Body mass index (BMI) 19 or less, adult: Secondary | ICD-10-CM | POA: Diagnosis not present

## 2023-01-13 DIAGNOSIS — G8929 Other chronic pain: Secondary | ICD-10-CM | POA: Diagnosis not present

## 2023-01-13 DIAGNOSIS — G4700 Insomnia, unspecified: Secondary | ICD-10-CM | POA: Diagnosis not present

## 2023-01-13 DIAGNOSIS — M4722 Other spondylosis with radiculopathy, cervical region: Secondary | ICD-10-CM | POA: Diagnosis not present

## 2023-01-13 DIAGNOSIS — D649 Anemia, unspecified: Secondary | ICD-10-CM | POA: Diagnosis not present

## 2023-01-13 DIAGNOSIS — R030 Elevated blood-pressure reading, without diagnosis of hypertension: Secondary | ICD-10-CM | POA: Diagnosis not present

## 2023-01-13 DIAGNOSIS — M25552 Pain in left hip: Secondary | ICD-10-CM | POA: Diagnosis not present

## 2023-01-13 DIAGNOSIS — R7309 Other abnormal glucose: Secondary | ICD-10-CM | POA: Diagnosis not present

## 2023-01-13 DIAGNOSIS — F17210 Nicotine dependence, cigarettes, uncomplicated: Secondary | ICD-10-CM | POA: Diagnosis not present

## 2023-02-01 ENCOUNTER — Other Ambulatory Visit: Payer: Self-pay | Admitting: Family Medicine

## 2023-02-01 DIAGNOSIS — N529 Male erectile dysfunction, unspecified: Secondary | ICD-10-CM

## 2023-02-05 DIAGNOSIS — Z681 Body mass index (BMI) 19 or less, adult: Secondary | ICD-10-CM | POA: Diagnosis not present

## 2023-02-05 DIAGNOSIS — G4700 Insomnia, unspecified: Secondary | ICD-10-CM | POA: Diagnosis not present

## 2023-02-05 DIAGNOSIS — R7309 Other abnormal glucose: Secondary | ICD-10-CM | POA: Diagnosis not present

## 2023-02-05 DIAGNOSIS — M4722 Other spondylosis with radiculopathy, cervical region: Secondary | ICD-10-CM | POA: Diagnosis not present

## 2023-02-05 DIAGNOSIS — G8929 Other chronic pain: Secondary | ICD-10-CM | POA: Diagnosis not present

## 2023-02-05 DIAGNOSIS — F17210 Nicotine dependence, cigarettes, uncomplicated: Secondary | ICD-10-CM | POA: Diagnosis not present

## 2023-02-05 DIAGNOSIS — Z6820 Body mass index (BMI) 20.0-20.9, adult: Secondary | ICD-10-CM | POA: Diagnosis not present

## 2023-02-05 DIAGNOSIS — M25552 Pain in left hip: Secondary | ICD-10-CM | POA: Diagnosis not present

## 2023-02-05 DIAGNOSIS — D649 Anemia, unspecified: Secondary | ICD-10-CM | POA: Diagnosis not present

## 2023-02-16 DIAGNOSIS — M7062 Trochanteric bursitis, left hip: Secondary | ICD-10-CM | POA: Diagnosis not present

## 2023-02-19 NOTE — Progress Notes (Signed)
[facility] Cancer Initial Visit:  Patient Care Team: [place], Betty G, MD as PCP - General (Family Medicine) [place], Betty G, MD (Family Medicine) Quintella Baton, RN as Case Manager Tarry Kos, MD as Attending Physician (Orthopedic Surgery)  CHIEF COMPLAINTS/PURPOSE OF CONSULTATION:  HISTORY OF PRESENTING ILLNESS: Philip Bates 61 y.o. male is here because of anemia Medical history notable for cerebral palsy, cervical radiculopathy, right leg weakness, osteoarthritis left hip, tobacco use, erectile dysfunction, BPH, chronic pain  August 13, 2020: Colonoscopy-moderate diverticulosis in sigmoid colon otherwise normal September 10, 2020: Left hip arthroplasty  December 12, 2022: WBC 5.3 hemoglobin 12.2 MCV 80 platelet count 221; 43 segs 43 lymphs 9 monos 3 eos 1 basophil Ferritin 35 folate 12.5 B12 1393 CMP notable for creatinine 0.7  Feb 20 2023:  [facility] Patient was born prematurely.  Has been told that he was anemic.  Has never had an EGD.  Underwent full dental extractions about 4 months ago.  Has full set of dentures which need to be adjusted.  Has nausea due to movement of top plate which makes eating difficult.  Has taken oral iron in the past but not certain that they have been effective.  No history of blood transfusion or IV iron.  Thirsty a lot but not craving ice.  Intolerant to cold.    No emesis, diarrhea.  Has experienced intermittent dark stools when takes oral iron.  Takes ASA daily but not on anticoagulants.  Does not use other NSAIDS.  No hematuria.   No family members with anemia.  He is doing CT lung screening.    Social:  Married.  Has worked in [employer], drove trucks, Agricultural consultant.  Has been smoking 1 ppd of cigarettes since 62 years of age.  EtOH quit 10 years ago because got a DUI.  Does not vape   Review of Systems - Oncology  MEDICAL HISTORY: Past Medical History:  Diagnosis Date   Arthritis    Cerebral palsy (HCC)     Cervical radiculopathy    H/O umbilical hernia repair 2001   Pre-diabetes    Scoliosis    Spondylosis of cervical spine     SURGICAL HISTORY: Past Surgical History:  Procedure Laterality Date   ANTERIOR CERVICAL DECOMPRESSION/DISCECTOMY FUSION 4 LEVELS N/A 08/11/2017   Procedure: Cervical three to Cervical seven Anterior Cervical discectomy with fusion/plate fixation;  Surgeon: Ditty, Loura Halt, MD;  Location: [facility] OR;  Service: Neurosurgery;  Laterality: N/A;   BACK SURGERY     HERNIA REPAIR     inguinal hernia repair in early 2000   LEG SURGERY     POSTERIOR CERVICAL FUSION/FORAMINOTOMY N/A 08/12/2018   Procedure: POSTERIOR CERVICAL FUSION WITH LATERAL MASS FIXATION CERVICAL THREE TO CERVICAL SEVEN;  Surgeon: Lisbeth Renshaw, MD;  Location: [facility] OR;  Service: Neurosurgery;  Laterality: N/A;   SHOULDER ARTHROSCOPY WITH ROTATOR CUFF REPAIR AND SUBACROMIAL DECOMPRESSION Right 02/02/2018   Procedure: RIGHT SHOULDER ARTHROSCOPY WITH SUBACROMIAL DECOMPRESSION, MINI-OPEN ROTATOR CUFF REPAIR;  Surgeon: Cammy Copa, MD;  Location: [facility] OR;  Service: Orthopedics;  Laterality: Right;   SHOULDER SURGERY Right    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   Procedure: LEFT TOTAL HIP ARTHROPLASTY ANTERIOR APPROACH;  Surgeon: Tarry Kos, MD;  Location: [facility] OR;  Service: Orthopedics;  Laterality: Left;    SOCIAL HISTORY: Social History   Socioeconomic History   Marital status: Married    Spouse name: Not on file   Number of children: Not  on file   Years of education: Not on file   Highest education level: Some college, no degree  Occupational History   Not on file  Tobacco Use   Smoking status: Every Day    Packs/day: 1.00    Years: 35.00    Additional pack years: 0.00    Total pack years: 35.00    Types: Cigarettes   Smokeless tobacco: Never  Vaping Use   Vaping Use: Never used  Substance and Sexual Activity   Alcohol use: Never   Drug use: Never   Sexual activity: Yes  Other  Topics Concern   Not on file  Social History Narrative   ** Merged History Encounter **       Social Determinants of Health   Financial Resource Strain: Low Risk  (04/16/2022)   Overall Financial Resource Strain (CARDIA)    Difficulty of Paying Living Expenses: Not hard at all  Food Insecurity: Food Insecurity Present (04/21/2022)   Hunger Vital Sign    Worried About Running Out of Food in the Last Year: Sometimes true    Ran Out of Food in the Last Year: Sometimes true  Transportation Needs: No Transportation Needs (04/21/2022)   PRAPARE - Administrator, Civil Service (Medical): No    Lack of Transportation (Non-Medical): No  Physical Activity: Sufficiently Active (04/21/2022)   Exercise Vital Sign    Days of Exercise per Week: 2 days    Minutes of Exercise per Session: 140 min  Recent Concern: Physical Activity - Insufficiently Active (04/16/2022)   Exercise Vital Sign    Days of Exercise per Week: 2 days    Minutes of Exercise per Session: 30 min  Stress: Stress Concern Present (04/21/2022)   [employer] of Occupational Health - Occupational Stress Questionnaire    Feeling of Stress : To some extent  Social Connections: Socially Integrated (04/21/2022)   Social Connection and Isolation Panel [NHANES]    Frequency of Communication with Friends and Family: Twice a week    Frequency of Social Gatherings with Friends and Family: Once a week    Attends Religious Services: More than 4 times per year    Active Member of [employer] or Organizations: No    Attends Engineer, structural: More than 4 times per year    Marital Status: Married  Catering manager Violence: Not At Risk (04/16/2022)   Humiliation, Afraid, Rape, and Kick questionnaire    Fear of Current or Ex-Partner: No    Emotionally Abused: No    Physically Abused: No    Sexually Abused: No    FAMILY HISTORY Family History  Problem Relation Age of Onset   Cancer Father        esophagus   Diabetes  Brother    Diabetes Paternal Uncle     ALLERGIES:  has no active allergies.  MEDICATIONS:  Current Outpatient Medications  Medication Sig Dispense Refill   aspirin EC 81 MG tablet Take 1 tablet (81 mg total) by mouth 2 (two) times daily. 84 tablet 0   ergocalciferol (VITAMIN D2) 1.25 MG (50000 UT) capsule 1 (ONE) CAPSULE CAPSULE BY MOUTH ONCE A WEEK     gabapentin (NEURONTIN) 300 MG capsule Take 1 capsule (300 mg total) by mouth 3 (three) times daily. 90 capsule 0   lidocaine (LIDODERM) 5 % Place 1 patch onto the skin daily. Remove & Discard patch within 12 hours or as directed by MD 30 patch 0   NARCAN 4 MG/0.1ML LIQD  nasal spray kit Place 1 spray into the nose as needed (opioid overdose).     oxyCODONE-acetaminophen (PERCOCET) 10-325 MG tablet Take 1 tablet by mouth 4 (four) times daily as needed.     sildenafil (REVATIO) 20 MG tablet TAKE 2 TO 3 TABLETS BY MOUTH ONCE DAILY AS NEEDED 90 tablet 2   zolpidem (AMBIEN) 10 MG tablet Take 10 mg by mouth at bedtime.     No current facility-administered medications for this visit.    PHYSICAL EXAMINATION:  ECOG PERFORMANCE STATUS: 0 - Asymptomatic   Vitals:   02/20/23 1356  BP: (!) 144/89  Pulse: 71  Resp: 15  Temp: (!) 97 F (36.1 C)  SpO2: 97%    Filed Weights   02/20/23 1356  Weight: 111 lb (50.3 kg)     Physical Exam Vitals and nursing note reviewed.  Constitutional:      General: He is not in acute distress.    Appearance: Normal appearance. He is not ill-appearing or diaphoretic.     Comments: Here alone.    HENT:     Head: Normocephalic and atraumatic.     Right Ear: External ear normal.     Left Ear: External ear normal.     Nose: Nose normal.  Eyes:     General: No scleral icterus.    Conjunctiva/sclera: Conjunctivae normal.     Pupils: Pupils are equal, round, and reactive to light.  Cardiovascular:     Rate and Rhythm: Normal rate and regular rhythm.     Heart sounds: Normal heart sounds. No murmur  heard.    No friction rub. No gallop.  Pulmonary:     Effort: Pulmonary effort is normal. No respiratory distress.     Breath sounds: Normal breath sounds. No wheezing.  Abdominal:     General: Abdomen is flat.     Palpations: Abdomen is soft.     Tenderness: There is no abdominal tenderness. There is no guarding or rebound.  Musculoskeletal:        General: No swelling or deformity.     Cervical back: Normal range of motion and neck supple. No rigidity or tenderness.     Right lower leg: No edema.     Left lower leg: No edema.     Comments: Ambulates with a cane  Lymphadenopathy:     Head:     Right side of head: No submental, submandibular, tonsillar, preauricular, posterior auricular or occipital adenopathy.     Left side of head: No submental, submandibular, tonsillar, preauricular, posterior auricular or occipital adenopathy.     Cervical: No cervical adenopathy.     Right cervical: No superficial, deep or posterior cervical adenopathy.    Left cervical: No superficial, deep or posterior cervical adenopathy.     Upper Body:     Right upper body: No supraclavicular or axillary adenopathy.     Left upper body: No supraclavicular or axillary adenopathy.     Lower Body: No right inguinal adenopathy. No left inguinal adenopathy.  Skin:    General: Skin is warm.     Coloration: Skin is not jaundiced.  Neurological:     General: No focal deficit present.     Mental Status: He is alert and oriented to person, place, and time.     Gait: Gait normal.  Psychiatric:        Mood and Affect: Mood normal.        Behavior: Behavior normal.  Thought Content: Thought content normal.        Judgment: Judgment normal.      LABORATORY DATA: I have personally reviewed the data as listed:  Clinical Support on 02/20/2023  Component Date Value Ref Range Status   Sodium 02/20/2023 139  135 - 145 mmol/L Final   Potassium 02/20/2023 3.8  3.5 - 5.1 mmol/L Final   Chloride 02/20/2023  105  98 - 111 mmol/L Final   CO2 02/20/2023 29  22 - 32 mmol/L Final   Glucose, Bld 02/20/2023 83  70 - 99 mg/dL Final   Glucose reference range applies only to samples taken after fasting for at least 8 hours.   BUN 02/20/2023 13  6 - 20 mg/dL Final   Creatinine 16/07/9603 0.65  0.61 - 1.24 mg/dL Final   Calcium 54/06/8118 9.3  8.9 - 10.3 mg/dL Final   Total Protein 14/78/2956 7.5  6.5 - 8.1 g/dL Final   Albumin 21/30/8657 4.6  3.5 - 5.0 g/dL Final   AST 84/69/6295 26  15 - 41 U/L Final   ALT 02/20/2023 32  0 - 44 U/L Final   Alkaline Phosphatase 02/20/2023 70  38 - 126 U/L Final   Total Bilirubin 02/20/2023 0.4  0.3 - 1.2 mg/dL Final   GFR, Estimated 02/20/2023 >60  >60 mL/min Final   Comment: (NOTE) Calculated using the CKD-EPI Creatinine Equation (2021)    Anion gap 02/20/2023 5  5 - 15 Final   Performed at [facility], [address] [place], [place] [address]   Hgb F 02/20/2023 0.0  0.0 - 2.0 % Final   Hgb A 02/20/2023 57.7 (L)  96.4 - 98.8 % Final   Hgb A2 02/20/2023 2.8  1.8 - 3.2 % Final   Hgb S 02/20/2023 39.5 (H)  0.0 % Final   Interpretation, Hgb Fract 02/20/2023 Comment   Final   Comment: (NOTE) Reflex to Hgb Solubility indicated for confirmation. Performed At: [facility] [address] [place], [place] [id number] Jolene Schimke MD ZD:[id number]    Retic Ct Pct 02/20/2023 1.0  0.4 - 3.1 % Final   RBC. 02/20/2023 4.91  4.22 - 5.81 MIL/uL Final   Retic Count, Absolute 02/20/2023 50.6  19.0 - 186.0 K/uL Final   Immature Retic Fract 02/20/2023 8.2  2.3 - 15.9 % Final   Performed at [facility], [address], [place], [place] [address]   IgG (Immunoglobin G), Serum 02/20/2023 945  603 - 1,613 mg/dL Final   IgA 87/56/4332 295  90 - 386 mg/dL Final   IgM (Immunoglobulin M), Srm 02/20/2023 46  20 - 172 mg/dL Final   Total Protein ELP 02/20/2023 7.4  6.0 - 8.5 g/dL Corrected   Albumin SerPl Elph-Mcnc  02/20/2023 3.9  2.9 - 4.4 g/dL Corrected   Alpha 1 95/18/8416 0.3  0.0 - 0.4 g/dL Corrected   Alpha2 Glob SerPl Elph-Mcnc 02/20/2023 0.8  0.4 - 1.0 g/dL Corrected   B-Globulin SerPl Elph-Mcnc 02/20/2023 1.3  0.7 - 1.3 g/dL Corrected   Gamma Glob SerPl Elph-Mcnc 02/20/2023 1.1  0.4 - 1.8 g/dL Corrected   M Protein SerPl Elph-Mcnc 02/20/2023 Not Observed  Not Observed g/dL Corrected   Globulin, Total 02/20/2023 3.5  2.2 - 3.9 g/dL Corrected   Albumin/Glob SerPl 02/20/2023 1.2  0.7 - 1.7 Corrected   IFE 1 02/20/2023 Comment   Corrected   Comment: (NOTE) The immunofixation pattern appears unremarkable. Evidence of monoclonal protein is not apparent.  Please Note 02/20/2023 Comment   Corrected   Comment: (NOTE) Protein electrophoresis scan will follow via computer, mail, or courier delivery. Performed At: [facility] [address] [place], [place] [id number] Jolene Schimke MD [id number]    Kappa free light chain 02/20/2023 25.7 (H)  3.3 - 19.4 mg/L Final   Lambda free light chains 02/20/2023 15.9  5.7 - 26.3 mg/L Final   Kappa, lambda light chain ratio 02/20/2023 1.62  0.26 - 1.65 Final   Comment: (NOTE) Performed At: [facility] [address] [place], [place] [id number] Jolene Schimke MD BM:[id number]    DAT, complement 02/20/2023 NEG   Final   DAT, IgG 02/20/2023    Final                   Value:NEG Performed at [facility], [address], [place], [place] [address]    Copper 02/20/2023 92  69 - 132 ug/dL Final   Comment: (NOTE) This test was developed and its performance characteristics determined by [facility]. It has not been cleared or approved by the Food and Drug Administration.                                Detection Limit = 5 Performed At: [facility] [address] [place], [place] [id number] Jolene Schimke MD QV:[id number]    Zinc 02/20/2023 128 (H)  44 - 115 ug/dL Final   Comment: (NOTE) This test was developed  and its performance characteristics determined by [facility]. It has not been cleared or approved by the Food and Drug Administration.                                Detection Limit = 5 Performed At: [facility] [address] [place], [place] [id number] Jolene Schimke MD YS:[id number]    Haptoglobin 02/20/2023 166  29 - 370 mg/dL Final   Comment: (NOTE) Performed At: [facility] [address] [place], [place] [id number] Jolene Schimke MD GU:[id number]    Ferritin 02/20/2023 26  24 - 336 ng/mL Final   Performed at [facility], [address] [place], [place] [address]   WBC Count 02/20/2023 5.2  4.0 - 10.5 K/uL Final   RBC 02/20/2023 4.90  4.22 - 5.81 MIL/uL Final   Hemoglobin 02/20/2023 13.4  13.0 - 17.0 g/dL Final   HCT 17/61/6073 38.3 (L)  39.0 - 52.0 % Final   MCV 02/20/2023 78.2 (L)  80.0 - 100.0 fL Final   MCH 02/20/2023 27.3  26.0 - 34.0 pg Final   MCHC 02/20/2023 35.0  30.0 - 36.0 g/dL Final   RDW 71/03/2693 14.2  11.5 - 15.5 % Final   Platelet Count 02/20/2023 255  150 - 400 K/uL Final   nRBC 02/20/2023 0.0  0.0 - 0.2 % Final   Neutrophils Relative % 02/20/2023 47  % Final   Neutro Abs 02/20/2023 2.4  1.7 - 7.7 K/uL Final   Lymphocytes Relative 02/20/2023 41  % Final   Lymphs Abs 02/20/2023 2.1  0.7 - 4.0 K/uL Final   Monocytes Relative 02/20/2023 9  % Final   Monocytes Absolute 02/20/2023 0.5  0.1 - 1.0 K/uL Final   Eosinophils Relative 02/20/2023 2  % Final   Eosinophils Absolute 02/20/2023 0.1  0.0 - 0.5 K/uL Final   Basophils Relative 02/20/2023 1  % Final  Basophils Absolute 02/20/2023 0.1  0.0 - 0.1 K/uL Final   Immature Granulocytes 02/20/2023 0  % Final   Abs Immature Granulocytes 02/20/2023 0.01  0.00 - 0.07 K/uL Final   Performed at [facility], 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 Suggest clinical and hematologic correlation.         Sickle Trait Interpretation Ranges         Hgb A       50.0 - 70.0%         Hgb S       30.0 - 45.0%         Hgb A2       1.8 -  4.0% Performed At: [facility] [address] [place], [place] [id number] Jolene Schimke MD XB:[id number]     RADIOGRAPHIC STUDIES: I have personally reviewed the radiological images as listed and agree with the findings in the report  No results found.  ASSESSMENT/PLAN  62 y.o. male is here because of anemia.  Medical history notable for cerebral palsy, cervical radiculopathy, right leg weakness, osteoarthritis left hip, tobacco use, erectile dysfunction, BPH, chronic pain  Anemia:  Etiology likely multifactorial with possible culprits being 1) Iron deficiency from occult GI blood loss exacerbated by antiplatelet therapy 2) Hemoglobinopathy 3) Chronic inflammation Will obtain CBC with diff, CMP, Ferritin, B12, folate, retic count,  DAT, Haptoglobin, SPEP with IEP, free light chains, Copper and Zinc levels, Hgb electrophoresis.  Consider testosterone, flow for PNH, thyroid function studies for enzymopathy in the future. GI consult for evaluation of iron deficiency.    Therapy to be decided upon based on laboratory results.   Will hold on transfusion at this time since Hgb is not sufficiently low enough to warrant it.    Tobacco use  Feb 20 2023- Enrolled in [place] lung screening program    Cancer Staging  No matching staging information was found for the patient.   No problem-specific Assessment & Plan notes found for this encounter.    Orders Placed This Encounter  Procedures   CBC with Differential (Cancer Center Only)    Standing Status:   Future    Number of Occurrences:   1    Standing Expiration Date:   02/20/2024   Ferritin    Standing Status:   Future    Number of  Occurrences:   1    Standing Expiration Date:   02/20/2024   Haptoglobin    Standing Status:   Future    Number of Occurrences:   1    Standing Expiration Date:   02/20/2024   Zinc    Standing Status:   Future    Number of Occurrences:   1    Standing Expiration Date:   02/20/2024   Copper, serum    Standing Status:   Future    Number of Occurrences:   1    Standing Expiration Date:   02/20/2024   Kappa/lambda light chains    Standing Status:   Future    Number of Occurrences:   1    Standing Expiration Date:   02/20/2024   Multiple Myeloma Panel (SPEP&IFE w/QIG)    Standing Status:   Future    Number of Occurrences:   1    Standing  Expiration Date:   02/20/2024   Reticulocytes    Standing Status:   Future    Number of Occurrences:   1    Standing Expiration Date:   02/20/2024   Hgb Fractionation Cascade    Standing Status:   Future    Number of Occurrences:   1    Standing Expiration Date:   02/20/2024   CMP (Cancer Center only)    Standing Status:   Future    Number of Occurrences:   1    Standing Expiration Date:   02/20/2024   Ambulatory referral to Gastroenterology    Referral Priority:   Routine    Referral Type:   Consultation    Referral Reason:   Specialty Services Required    Number of Visits Requested:   1   Direct antiglobulin test (not at [facility])    Standing Status:   Future    Number of Occurrences:   1    Standing Expiration Date:   02/20/2024    45  minutes was spent in patient care.  This included time spent preparing to see the patient (e.g., review of tests), obtaining and/or reviewing separately obtained history, counseling and educating the patient/family/caregiver, ordering medications, tests, or procedures; documenting clinical information in the electronic or other health record, independently interpreting results and communicating results to the patient/family/caregiver as well as coordination of care.       All questions were answered. The patient knows to  call the clinic with any problems, questions or concerns.  This note was electronically signed.    Loni Muse, MD  03/11/2023 1:09 PM

## 2023-02-20 ENCOUNTER — Inpatient Hospital Stay: Payer: 59 | Attending: Oncology | Admitting: Oncology

## 2023-02-20 ENCOUNTER — Other Ambulatory Visit: Payer: Self-pay

## 2023-02-20 ENCOUNTER — Inpatient Hospital Stay: Payer: 59

## 2023-02-20 VITALS — BP 144/89 | HR 71 | Temp 97.0°F | Resp 15 | Wt 111.0 lb

## 2023-02-20 DIAGNOSIS — N400 Enlarged prostate without lower urinary tract symptoms: Secondary | ICD-10-CM | POA: Diagnosis not present

## 2023-02-20 DIAGNOSIS — G8929 Other chronic pain: Secondary | ICD-10-CM | POA: Insufficient documentation

## 2023-02-20 DIAGNOSIS — D649 Anemia, unspecified: Secondary | ICD-10-CM | POA: Insufficient documentation

## 2023-02-20 DIAGNOSIS — Z79899 Other long term (current) drug therapy: Secondary | ICD-10-CM | POA: Diagnosis not present

## 2023-02-20 DIAGNOSIS — G809 Cerebral palsy, unspecified: Secondary | ICD-10-CM | POA: Diagnosis not present

## 2023-02-20 DIAGNOSIS — F17210 Nicotine dependence, cigarettes, uncomplicated: Secondary | ICD-10-CM | POA: Diagnosis not present

## 2023-02-20 DIAGNOSIS — D539 Nutritional anemia, unspecified: Secondary | ICD-10-CM | POA: Insufficient documentation

## 2023-02-20 DIAGNOSIS — F17200 Nicotine dependence, unspecified, uncomplicated: Secondary | ICD-10-CM

## 2023-02-20 DIAGNOSIS — M1612 Unilateral primary osteoarthritis, left hip: Secondary | ICD-10-CM | POA: Diagnosis not present

## 2023-02-20 DIAGNOSIS — Z7982 Long term (current) use of aspirin: Secondary | ICD-10-CM

## 2023-02-20 LAB — CBC WITH DIFFERENTIAL (CANCER CENTER ONLY)
Abs Immature Granulocytes: 0.01 10*3/uL (ref 0.00–0.07)
Basophils Absolute: 0.1 10*3/uL (ref 0.0–0.1)
Basophils Relative: 1 %
Eosinophils Absolute: 0.1 10*3/uL (ref 0.0–0.5)
Eosinophils Relative: 2 %
HCT: 38.3 % — ABNORMAL LOW (ref 39.0–52.0)
Hemoglobin: 13.4 g/dL (ref 13.0–17.0)
Immature Granulocytes: 0 %
Lymphocytes Relative: 41 %
Lymphs Abs: 2.1 10*3/uL (ref 0.7–4.0)
MCH: 27.3 pg (ref 26.0–34.0)
MCHC: 35 g/dL (ref 30.0–36.0)
MCV: 78.2 fL — ABNORMAL LOW (ref 80.0–100.0)
Monocytes Absolute: 0.5 10*3/uL (ref 0.1–1.0)
Monocytes Relative: 9 %
Neutro Abs: 2.4 10*3/uL (ref 1.7–7.7)
Neutrophils Relative %: 47 %
Platelet Count: 255 10*3/uL (ref 150–400)
RBC: 4.9 MIL/uL (ref 4.22–5.81)
RDW: 14.2 % (ref 11.5–15.5)
WBC Count: 5.2 10*3/uL (ref 4.0–10.5)
nRBC: 0 % (ref 0.0–0.2)

## 2023-02-20 LAB — CMP (CANCER CENTER ONLY)
ALT: 32 U/L (ref 0–44)
AST: 26 U/L (ref 15–41)
Albumin: 4.6 g/dL (ref 3.5–5.0)
Alkaline Phosphatase: 70 U/L (ref 38–126)
Anion gap: 5 (ref 5–15)
BUN: 13 mg/dL (ref 6–20)
CO2: 29 mmol/L (ref 22–32)
Calcium: 9.3 mg/dL (ref 8.9–10.3)
Chloride: 105 mmol/L (ref 98–111)
Creatinine: 0.65 mg/dL (ref 0.61–1.24)
GFR, Estimated: >60 mL/min (ref >60)
Glucose, Bld: 83 mg/dL (ref 70–99)
Potassium: 3.8 mmol/L (ref 3.5–5.1)
Sodium: 139 mmol/L (ref 135–145)
Total Bilirubin: 0.4 mg/dL (ref 0.3–1.2)
Total Protein: 7.5 g/dL (ref 6.5–8.1)

## 2023-02-20 LAB — RETICULOCYTES
Immature Retic Fract: 8.2 % (ref 2.3–15.9)
RBC.: 4.91 MIL/uL (ref 4.22–5.81)
Retic Count, Absolute: 50.6 10*3/uL (ref 19.0–186.0)
Retic Ct Pct: 1 % (ref 0.4–3.1)

## 2023-02-20 LAB — DIRECT ANTIGLOBULIN TEST (NOT AT ARMC)
DAT, IgG: NEGATIVE
DAT, complement: NEGATIVE

## 2023-02-20 LAB — FERRITIN: Ferritin: 26 ng/mL (ref 24–336)

## 2023-02-22 LAB — HAPTOGLOBIN: Haptoglobin: 166 mg/dL (ref 29–370)

## 2023-02-23 ENCOUNTER — Telehealth: Payer: Self-pay | Admitting: Oncology

## 2023-02-23 LAB — HGB FRACTIONATION CASCADE
Hgb A2: 2.8 % (ref 1.8–3.2)
Hgb A: 57.7 % — ABNORMAL LOW (ref 96.4–98.8)
Hgb F: 0 % (ref 0.0–2.0)
Hgb S: 39.5 % — ABNORMAL HIGH

## 2023-02-23 LAB — HGB SOLUBILITY: Hgb Solubility: POSITIVE — AB

## 2023-02-23 LAB — COPPER, SERUM: Copper: 92 ug/dL (ref 69–132)

## 2023-02-23 LAB — ZINC: Zinc: 128 ug/dL — ABNORMAL HIGH (ref 44–115)

## 2023-02-24 LAB — KAPPA/LAMBDA LIGHT CHAINS
Kappa free light chain: 25.7 mg/L — ABNORMAL HIGH (ref 3.3–19.4)
Kappa, lambda light chain ratio: 1.62 (ref 0.26–1.65)
Lambda free light chains: 15.9 mg/L (ref 5.7–26.3)

## 2023-02-26 LAB — MULTIPLE MYELOMA PANEL, SERUM
Albumin SerPl Elph-Mcnc: 3.9 g/dL (ref 2.9–4.4)
Albumin/Glob SerPl: 1.2 (ref 0.7–1.7)
Alpha 1: 0.3 g/dL (ref 0.0–0.4)
Alpha2 Glob SerPl Elph-Mcnc: 0.8 g/dL (ref 0.4–1.0)
B-Globulin SerPl Elph-Mcnc: 1.3 g/dL (ref 0.7–1.3)
Gamma Glob SerPl Elph-Mcnc: 1.1 g/dL (ref 0.4–1.8)
Globulin, Total: 3.5 g/dL (ref 2.2–3.9)
IgA: 295 mg/dL (ref 90–386)
IgG (Immunoglobin G), Serum: 945 mg/dL (ref 603–1613)
IgM (Immunoglobulin M), Srm: 46 mg/dL (ref 20–172)
Total Protein ELP: 7.4 g/dL (ref 6.0–8.5)

## 2023-03-03 NOTE — Progress Notes (Unsigned)
Chief Complaint  Patient presents with   Follow-up   Referral    HPI: [name] is a 62 y.o. male, who is here today with his wife requesting referral to pain management, he would like to see the same provider his wife does. He is following with pain management at [facility]   Review of Systems  Constitutional:  Negative for chills and fever.  Respiratory:  Negative for cough, shortness of breath and wheezing.   Gastrointestinal:  Negative for abdominal pain, nausea and vomiting.  Musculoskeletal:  Positive for arthralgias, back pain and gait problem.  Skin:  Negative for rash.  See other pertinent positives and negatives in HPI.  Current Outpatient Medications on File Prior to Visit  Medication Sig Dispense Refill   aspirin EC 81 MG tablet Take 1 tablet (81 mg total) by mouth 2 (two) times daily. 84 tablet 0   ergocalciferol (VITAMIN D2) 1.25 MG (50000 UT) capsule 1 (ONE) CAPSULE CAPSULE BY MOUTH ONCE A WEEK     gabapentin (NEURONTIN) 300 MG capsule Take 1 capsule (300 mg total) by mouth 3 (three) times daily. 90 capsule 0   lidocaine (LIDODERM) 5 % Place 1 patch onto the skin daily. Remove & Discard patch within 12 hours or as directed by MD 30 patch 0   NARCAN 4 MG/0.1ML LIQD nasal spray kit Place 1 spray into the nose as needed (opioid overdose).     oxyCODONE-acetaminophen (PERCOCET) 10-325 MG tablet Take 1 tablet by mouth 4 (four) times daily as needed.     sildenafil (REVATIO) 20 MG tablet TAKE 2 TO 3 TABLETS BY MOUTH ONCE DAILY AS NEEDED 90 tablet 2   zolpidem (AMBIEN) 10 MG tablet Take 10 mg by mouth at bedtime.     No current facility-administered medications on file prior to visit.   Past Medical History:  Diagnosis Date   Arthritis    Cerebral palsy (HCC)    Cervical radiculopathy    H/O umbilical hernia repair 2001   Pre-diabetes    Scoliosis    Spondylosis of cervical spine    No Active Allergies  Social History   Socioeconomic History    Marital status: Married    Spouse name: Not on file   Number of children: Not on file   Years of education: Not on file   Highest education level: Some college, no degree  Occupational History   Not on file  Tobacco Use   Smoking status: Every Day    Packs/day: 1.00    Years: 35.00    Additional pack years: 0.00    Total pack years: 35.00    Types: Cigarettes   Smokeless tobacco: Never  Vaping Use   Vaping Use: Never used  Substance and Sexual Activity   Alcohol use: Never   Drug use: Never   Sexual activity: Yes  Other Topics Concern   Not on file  Social History Narrative   ** Merged History Encounter **       Social Determinants of Health   Financial Resource Strain: Low Risk  (04/16/2022)   Overall Financial Resource Strain (CARDIA)    Difficulty of Paying Living Expenses: Not hard at all  Food Insecurity: Food Insecurity Present (04/21/2022)   Hunger Vital Sign    Worried About Running Out of Food in the Last Year: Sometimes true    Ran Out of Food in the Last Year: Sometimes true  Transportation Needs: No Transportation Needs (04/21/2022)   PRAPARE - Transportation  Lack of Transportation (Medical): No    Lack of Transportation (Non-Medical): No  Physical Activity: Sufficiently Active (04/21/2022)   Exercise Vital Sign    Days of Exercise per Week: 2 days    Minutes of Exercise per Session: 140 min  Recent Concern: Physical Activity - Insufficiently Active (04/16/2022)   Exercise Vital Sign    Days of Exercise per Week: 2 days    Minutes of Exercise per Session: 30 min  Stress: Stress Concern Present (04/21/2022)   Philip Bates of Occupational Health - Occupational Stress Questionnaire    Feeling of Stress : To some extent  Social Connections: Socially Integrated (04/21/2022)   Social Connection and Isolation Panel [NHANES]    Frequency of Communication with Friends and Family: Twice a week    Frequency of Social Gatherings with Friends and Family: Once  a week    Attends Religious Services: More than 4 times per year    Active Member of Clubs or Organizations: No    Attends Engineer, structural: More than 4 times per year    Marital Status: Married    Vitals:   03/04/23 1044  BP: 120/70  Pulse: 82  Resp: 16  SpO2: 99%   Body mass index is 19.07 kg/m.  Physical Exam Vitals and nursing note reviewed.  Constitutional:      General: He is not in acute distress.    Appearance: He is well-developed.  HENT:     Head: Normocephalic and atraumatic.  Eyes:     Conjunctiva/sclera: Conjunctivae normal.  Cardiovascular:     Rate and Rhythm: Normal rate and regular rhythm.     Heart sounds: No murmur heard. Pulmonary:     Effort: Pulmonary effort is normal. No respiratory distress.     Breath sounds: Normal breath sounds.  Abdominal:     Palpations: Abdomen is soft. There is no hepatomegaly or mass.     Tenderness: There is no abdominal tenderness. There is no right CVA tenderness or left CVA tenderness.  Musculoskeletal:     Comments: LE contractures, leg length discrepancy,and limping.  Lymphadenopathy:     Cervical: No cervical adenopathy.  Skin:    General: Skin is warm.     Findings: No erythema or rash.  Neurological:     Mental Status: He is alert and oriented to person, place, and time.     Cranial Nerves: No cranial nerve deficit.     Comments: Antalgic gait, unstable,assisted by a cane.  Psychiatric:        Mood and Affect: Mood and affect normal.    ASSESSMENT AND PLAN:  Philip Bates was seen today for follow-up and referral.  Diagnoses and all orders for this visit:  Chronic pain disorder -     Ambulatory referral to Pain Clinic  Prediabetes -     POC HgB A1c  Cerebral palsy, unspecified type (HCC)  Iron deficiency anemia, unspecified iron deficiency anemia type    Orders Placed This Encounter  Procedures   Ambulatory referral to Pain Clinic   POC HgB A1c    No problem-specific Assessment &  Plan notes found for this encounter.   Return if symptoms worsen or fail to improve.  Bradly Sangiovanni G. [place], MD  [facility]. [place] office.

## 2023-03-04 ENCOUNTER — Ambulatory Visit (INDEPENDENT_AMBULATORY_CARE_PROVIDER_SITE_OTHER): Payer: 59 | Admitting: Family Medicine

## 2023-03-04 ENCOUNTER — Encounter: Payer: Self-pay | Admitting: Family Medicine

## 2023-03-04 VITALS — BP 120/70 | HR 82 | Resp 16 | Ht 64.0 in | Wt 111.1 lb

## 2023-03-04 DIAGNOSIS — I739 Peripheral vascular disease, unspecified: Secondary | ICD-10-CM | POA: Insufficient documentation

## 2023-03-04 DIAGNOSIS — G809 Cerebral palsy, unspecified: Secondary | ICD-10-CM

## 2023-03-04 DIAGNOSIS — D509 Iron deficiency anemia, unspecified: Secondary | ICD-10-CM

## 2023-03-04 DIAGNOSIS — R7303 Prediabetes: Secondary | ICD-10-CM | POA: Diagnosis not present

## 2023-03-04 DIAGNOSIS — G894 Chronic pain syndrome: Secondary | ICD-10-CM

## 2023-03-04 LAB — POCT GLYCOSYLATED HEMOGLOBIN (HGB A1C): Hemoglobin A1C: 5.6 % (ref 4.0–5.6)

## 2023-03-04 NOTE — Patient Instructions (Addendum)
A few things to remember from today's visit:  Chronic pain disorder - Plan: Ambulatory referral to Pain Clinic  Prediabetes - Plan: POC HgB A1c  Cerebral palsy, unspecified type (HCC), Chronic  Iron deficiency anemia, unspecified iron deficiency anemia type Lab Results  Component Value Date   HGBA1C 5.6 03/04/2023   Please have records from [facility] faxed to our office. No changes today.  Do not use My Chart to request refills or for acute issues that need immediate attention. If you send a my chart message, it may take a few days to be addressed, specially if I am not in the office.  Please be sure medication list is accurate. If a new problem present, please set up appointment sooner than planned today.

## 2023-03-05 DIAGNOSIS — G4700 Insomnia, unspecified: Secondary | ICD-10-CM | POA: Diagnosis not present

## 2023-03-05 DIAGNOSIS — Z681 Body mass index (BMI) 19 or less, adult: Secondary | ICD-10-CM | POA: Diagnosis not present

## 2023-03-05 DIAGNOSIS — Z6820 Body mass index (BMI) 20.0-20.9, adult: Secondary | ICD-10-CM | POA: Diagnosis not present

## 2023-03-05 DIAGNOSIS — R7309 Other abnormal glucose: Secondary | ICD-10-CM | POA: Diagnosis not present

## 2023-03-05 DIAGNOSIS — F17210 Nicotine dependence, cigarettes, uncomplicated: Secondary | ICD-10-CM | POA: Diagnosis not present

## 2023-03-05 DIAGNOSIS — M4722 Other spondylosis with radiculopathy, cervical region: Secondary | ICD-10-CM | POA: Diagnosis not present

## 2023-03-05 DIAGNOSIS — Z79899 Other long term (current) drug therapy: Secondary | ICD-10-CM | POA: Diagnosis not present

## 2023-03-05 DIAGNOSIS — M25552 Pain in left hip: Secondary | ICD-10-CM | POA: Diagnosis not present

## 2023-03-05 DIAGNOSIS — D649 Anemia, unspecified: Secondary | ICD-10-CM | POA: Diagnosis not present

## 2023-03-05 DIAGNOSIS — G8929 Other chronic pain: Secondary | ICD-10-CM | POA: Diagnosis not present

## 2023-03-06 NOTE — Progress Notes (Incomplete)
Chief Complaint  Patient presents with  . Follow-up  . Referral    HPI: [name] is a 62 y.o. male, who is here today with his wife requesting referral to pain management, he would like to see the same provider his wife does. He is following with pain management at [facility]. Chronic back pain and arthralgias,shoulders and hip mainly. He follows with ortho. On chronic opioid treatment.  Iron def anemia: He reports being referred to a cancer center by his pain manager due to anemia, where tests were conducted, but he finds the results difficult to interpret. He states that he received lab results but has not gone through results with ordering provider. He has an appt with hematologist next month. Ferritin 26 (16) Lab Results  Component Value Date   WBC 5.2 02/20/2023   HGB 13.4 02/20/2023   HCT 38.3 (L) 02/20/2023   MCV 78.2 (L) 02/20/2023   PLT 255 02/20/2023   Lab Results  Component Value Date   CREATININE 0.65 02/20/2023   BUN 13 02/20/2023   NA 139 02/20/2023   K 3.8 02/20/2023   CL 105 02/20/2023   CO2 29 02/20/2023   Lab Results  Component Value Date   ALT 32 02/20/2023   AST 26 02/20/2023   ALKPHOS 70 02/20/2023   BILITOT 0.4 02/20/2023   Prediabetes: HgA1C 6.0 in 04/2022. Negative for abdominal pain, nausea,vomiting, polydipsia,polyuria, or polyphagia.   He expresses confusion regarding a lab ordered to evaluate for multiple myeloma.  He is currently taking iron supplementation.  Additionally, he reports hx of sickle cell trait, not aware of any other hemoglobinopathy.  PAD: He has reported carotid US done at [facility] and according to pt, he had 50% stenosis. He is not on statin medication. Smoker.  Review of Systems  Constitutional:  Negative for chills and fever.  Respiratory:  Negative for cough, shortness of breath and wheezing.   Gastrointestinal:  Negative for abdominal pain, nausea and vomiting.  Musculoskeletal:   Positive for arthralgias, back pain and gait problem.  Skin:  Negative for rash.  See other pertinent positives and negatives in HPI.  Current Outpatient Medications on File Prior to Visit  Medication Sig Dispense Refill  . aspirin EC 81 MG tablet Take 1 tablet (81 mg total) by mouth 2 (two) times daily. 84 tablet 0  . ergocalciferol (VITAMIN D2) 1.25 MG (50000 UT) capsule 1 (ONE) CAPSULE CAPSULE BY MOUTH ONCE A WEEK    . gabapentin (NEURONTIN) 300 MG capsule Take 1 capsule (300 mg total) by mouth 3 (three) times daily. 90 capsule 0  . lidocaine (LIDODERM) 5 % Place 1 patch onto the skin daily. Remove & Discard patch within 12 hours or as directed by MD 30 patch 0  . NARCAN 4 MG/0.1ML LIQD nasal spray kit Place 1 spray into the nose as needed (opioid overdose).    Marland Kitchen oxyCODONE-acetaminophen (PERCOCET) 10-325 MG tablet Take 1 tablet by mouth 4 (four) times daily as needed.    . sildenafil (REVATIO) 20 MG tablet TAKE 2 TO 3 TABLETS BY MOUTH ONCE DAILY AS NEEDED 90 tablet 2  . zolpidem (AMBIEN) 10 MG tablet Take 10 mg by mouth at bedtime.     No current facility-administered medications on file prior to visit.   Past Medical History:  Diagnosis Date  . Arthritis   . Cerebral palsy (HCC)   . Cervical radiculopathy   . H/O umbilical hernia repair 2001  . Pre-diabetes   .  Scoliosis   . Spondylosis of cervical spine    No Active Allergies  Social History   Socioeconomic History  . Marital status: Married    Spouse name: Not on file  . Number of children: Not on file  . Years of education: Not on file  . Highest education level: Some college, no degree  Occupational History  . Not on file  Tobacco Use  . Smoking status: Every Day    Packs/day: 1.00    Years: 35.00    Additional pack years: 0.00    Total pack years: 35.00    Types: Cigarettes  . Smokeless tobacco: Never  Vaping Use  . Vaping Use: Never used  Substance and Sexual Activity  . Alcohol use: Never  . Drug use:  Never  . Sexual activity: Yes  Other Topics Concern  . Not on file  Social History Narrative   ** Merged History Encounter **       Social Determinants of Health   Financial Resource Strain: Low Risk  (04/16/2022)   Overall Financial Resource Strain (CARDIA)   . Difficulty of Paying Living Expenses: Not hard at all  Food Insecurity: Food Insecurity Present (04/21/2022)   Hunger Vital Sign   . Worried About Programme researcher, broadcasting/film/video in the Last Year: Sometimes true   . Ran Out of Food in the Last Year: Sometimes true  Transportation Needs: No Transportation Needs (04/21/2022)   PRAPARE - Transportation   . Lack of Transportation (Medical): No   . Lack of Transportation (Non-Medical): No  Physical Activity: Sufficiently Active (04/21/2022)   Exercise Vital Sign   . Days of Exercise per Week: 2 days   . Minutes of Exercise per Session: 140 min  Recent Concern: Physical Activity - Insufficiently Active (04/16/2022)   Exercise Vital Sign   . Days of Exercise per Week: 2 days   . Minutes of Exercise per Session: 30 min  Stress: Stress Concern Present (04/21/2022)   [employer] of Occupational Health - Occupational Stress Questionnaire   . Feeling of Stress : To some extent  Social Connections: Socially Integrated (04/21/2022)   Social Connection and Isolation Panel [NHANES]   . Frequency of Communication with Friends and Family: Twice a week   . Frequency of Social Gatherings with Friends and Family: Once a week   . Attends Religious Services: More than 4 times per year   . Active Member of Clubs or Organizations: No   . Attends Banker Meetings: More than 4 times per year   . Marital Status: Married    Vitals:   03/04/23 1044  BP: 120/70  Pulse: 82  Resp: 16  SpO2: 99%   Body mass index is 19.07 kg/m.  Physical Exam Vitals and nursing note reviewed.  Constitutional:      General: He is not in acute distress.    Appearance: He is well-developed.  HENT:      Head: Normocephalic and atraumatic.  Eyes:     Conjunctiva/sclera: Conjunctivae normal.  Cardiovascular:     Rate and Rhythm: Normal rate and regular rhythm.     Heart sounds: No murmur heard. Pulmonary:     Effort: Pulmonary effort is normal. No respiratory distress.     Breath sounds: Normal breath sounds.  Abdominal:     Palpations: Abdomen is soft. There is no hepatomegaly or mass.     Tenderness: There is no abdominal tenderness. There is no right CVA tenderness or left CVA tenderness.  Musculoskeletal:     Comments: LE contractures, leg length discrepancy,and limping.  Lymphadenopathy:     Cervical: No cervical adenopathy.  Skin:    General: Skin is warm.     Findings: No erythema or rash.  Neurological:     Mental Status: He is alert and oriented to person, place, and time.     Cranial Nerves: No cranial nerve deficit.     Comments: Antalgic gait, unstable,assisted by a cane.  Psychiatric:        Mood and Affect: Mood and affect normal.    ASSESSMENT AND PLAN:  Lela was seen today for follow-up and referral.  Diagnoses and all orders for this visit:  Chronic pain disorder -     Ambulatory referral to Pain Clinic  Prediabetes -     POC HgB A1c  Cerebral palsy, unspecified type (HCC)  Iron deficiency anemia, unspecified iron deficiency anemia type    Orders Placed This Encounter  Procedures  . Ambulatory referral to Pain Clinic  . POC HgB A1c    No problem-specific Assessment & Plan notes found for this encounter.   Return if symptoms worsen or fail to improve.  Jaydin Jalomo G. [place], MD  [facility]. [place] office.

## 2023-03-07 NOTE — Assessment & Plan Note (Signed)
Continue iron supplementation. Colonoscopy 08/2020. Anemia has improved. He has an appt with hematologist in 03/2023.

## 2023-03-07 NOTE — Assessment & Plan Note (Signed)
I have not seen results of carotid artery [place] perform about a year ago at [facility]. Per pt report he has 50 % stenosis. He is not on statin medication.Will hold on adding medication at this time until we receive copy of report. Stressed the importance of smoking cessation. Last LDL 61 in 04/2022.

## 2023-03-07 NOTE — Assessment & Plan Note (Signed)
On chronic opioid treatment and currently following with pain management at [facility]He wants to change to Dr Carlis Abbott, referral placed.

## 2023-03-07 NOTE — Assessment & Plan Note (Signed)
HgA1C went from 6.0 to 5.6. Encouraged consistency with a healthy life style for diabetes prevention.

## 2023-03-07 NOTE — Assessment & Plan Note (Addendum)
He has undergone several orthopedic surgeries with residual pain. Needs assistance with transfer, uses a cane. Rest of ADL's  independent. Follows with orthopedist and pain management.

## 2023-03-11 DIAGNOSIS — Z7982 Long term (current) use of aspirin: Secondary | ICD-10-CM | POA: Insufficient documentation

## 2023-04-01 ENCOUNTER — Encounter: Payer: Self-pay | Admitting: Physical Medicine and Rehabilitation

## 2023-04-02 NOTE — Progress Notes (Signed)
[facility] Cancer Follow up Visit:  Patient Care Team: [place], Betty G, MD as PCP - General (Family Medicine) [place], Betty G, MD (Family Medicine) Quintella Baton, RN as Case Manager Tarry Kos, MD as Attending Physician (Orthopedic Surgery)  CHIEF COMPLAINTS/PURPOSE OF CONSULTATION:  HISTORY OF PRESENTING ILLNESS: Philip Bates 62 y.o. male is here because of anemia Medical history notable for cerebral palsy, cervical radiculopathy, right leg weakness, osteoarthritis left hip, tobacco use, erectile dysfunction, BPH, chronic pain  August 13, 2020: Colonoscopy-moderate diverticulosis in sigmoid colon otherwise normal September 10, 2020: Left hip arthroplasty  December 12, 2022: WBC 5.3 hemoglobin 12.2 MCV 80 platelet count 221; 43 segs 43 lymphs 9 monos 3 eos 1 basophil Ferritin 35 folate 12.5 B12 1393 CMP notable for creatinine 0.7  Feb 20 2023:  [facility] Patient was born prematurely.  Has been told that he was anemic.  Has never had an EGD.  Underwent full dental extractions about 4 months ago.  Has full set of dentures which need to be adjusted.  Has nausea due to movement of top plate which makes eating difficult.  Has taken oral iron in the past but not certain that they have been effective.  No history of blood transfusion or IV iron.  Thirsty a lot but not craving ice.  Intolerant to cold.    No emesis, diarrhea.  Has experienced intermittent dark stools when takes oral iron.  Takes ASA daily but not on anticoagulants.  Does not use other NSAIDS.  No hematuria.   No family members with anemia.  He is doing CT lung screening.    Social:  Married.  Has worked in [employer], drove trucks, Agricultural consultant.  Has been smoking 1 ppd of cigarettes since 62 years of age.  EtOH quit 10 years ago because got a DUI.  Does not vape  WBC 5.2 hemoglobin 13.4 MCV 78 platelet count 255; 47 segs 41 lymphs 9 monos 2 eos 1 basophil.  Reticulocyte count 1%.  Hemoglobin  electrophoresis showed hemoglobin A 58% A 2.8% hemoglobin S 40% is consistent with hemoglobin AS Co[place]bs test negative   Haptoglobin 166 SPEP with IEP showed no paraprotein serum free kappa 25.7 lambda 15.9 with a kappa lambda 1.62 IgG 945 IgA 295 IgM 46 Ferritin 26 copper 92 zinc 128 CMP normal  April 03 2023:  Scheduled follow up for anemia.  Reviewed results of labs with patient and wife.  Will refer patient for EGD and colonoscopy  Review of Systems - Oncology  MEDICAL HISTORY: Past Medical History:  Diagnosis Date   Arthritis    Cerebral palsy (HCC)    Cervical radiculopathy    H/O umbilical hernia repair 2001   Pre-diabetes    Scoliosis    Spondylosis of cervical spine     SURGICAL HISTORY: Past Surgical History:  Procedure Laterality Date   ANTERIOR CERVICAL DECOMPRESSION/DISCECTOMY FUSION 4 LEVELS N/A 08/11/2017   Procedure: Cervical three to Cervical seven Anterior Cervical discectomy with fusion/plate fixation;  Surgeon: Ditty, Loura Halt, MD;  Location: [facility] OR;  Service: Neurosurgery;  Laterality: N/A;   BACK SURGERY     HERNIA REPAIR     inguinal hernia repair in early 2000   LEG SURGERY     POSTERIOR CERVICAL FUSION/FORAMINOTOMY N/A 08/12/2018   Procedure: POSTERIOR CERVICAL FUSION WITH LATERAL MASS FIXATION CERVICAL THREE TO CERVICAL SEVEN;  Surgeon: Lisbeth Renshaw, MD;  Location: [facility] OR;  Service: Neurosurgery;  Laterality: N/A;   SHOULDER ARTHROSCOPY  WITH ROTATOR CUFF REPAIR AND SUBACROMIAL DECOMPRESSION Right 02/02/2018   Procedure: RIGHT SHOULDER ARTHROSCOPY WITH SUBACROMIAL DECOMPRESSION, MINI-OPEN ROTATOR CUFF REPAIR;  Surgeon: Cammy Copa, MD;  Location: [facility] OR;  Service: Orthopedics;  Laterality: Right;   SHOULDER SURGERY Right    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   Procedure: LEFT TOTAL HIP ARTHROPLASTY ANTERIOR APPROACH;  Surgeon: Tarry Kos, MD;  Location: [facility] OR;  Service: Orthopedics;  Laterality: Left;    SOCIAL HISTORY: Social  History   Socioeconomic History   Marital status: Married    Spouse name: Not on file   Number of children: Not on file   Years of education: Not on file   Highest education level: Some college, no degree  Occupational History   Not on file  Tobacco Use   Smoking status: Every Day    Packs/day: 1.00    Years: 35.00    Additional pack years: 0.00    Total pack years: 35.00    Types: Cigarettes   Smokeless tobacco: Never  Vaping Use   Vaping Use: Never used  Substance and Sexual Activity   Alcohol use: Never   Drug use: Never   Sexual activity: Yes  Other Topics Concern   Not on file  Social History Narrative   ** Merged History Encounter **       Social Determinants of Health   Financial Resource Strain: Low Risk  (04/16/2022)   Overall Financial Resource Strain (CARDIA)    Difficulty of Paying Living Expenses: Not hard at all  Food Insecurity: Food Insecurity Present (04/21/2022)   Hunger Vital Sign    Worried About Running Out of Food in the Last Year: Sometimes true    Ran Out of Food in the Last Year: Sometimes true  Transportation Needs: No Transportation Needs (04/21/2022)   PRAPARE - Administrator, Civil Service (Medical): No    Lack of Transportation (Non-Medical): No  Physical Activity: Sufficiently Active (04/21/2022)   Exercise Vital Sign    Days of Exercise per Week: 2 days    Minutes of Exercise per Session: 140 min  Recent Concern: Physical Activity - Insufficiently Active (04/16/2022)   Exercise Vital Sign    Days of Exercise per Week: 2 days    Minutes of Exercise per Session: 30 min  Stress: Stress Concern Present (04/21/2022)   [employer] of Occupational Health - Occupational Stress Questionnaire    Feeling of Stress : To some extent  Social Connections: Socially Integrated (04/21/2022)   Social Connection and Isolation Panel [NHANES]    Frequency of Communication with Friends and Family: Twice a week    Frequency of Social  Gatherings with Friends and Family: Once a week    Attends Religious Services: More than 4 times per year    Active Member of [employer] or Organizations: No    Attends Engineer, structural: More than 4 times per year    Marital Status: Married  Catering manager Violence: Not At Risk (04/16/2022)   Humiliation, Afraid, Rape, and Kick questionnaire    Fear of Current or Ex-Partner: No    Emotionally Abused: No    Physically Abused: No    Sexually Abused: No    FAMILY HISTORY Family History  Problem Relation Age of Onset   Cancer Father        esophagus   Diabetes Brother    Diabetes Paternal Uncle     ALLERGIES:  has no active allergies.  MEDICATIONS:  Current  Outpatient Medications  Medication Sig Dispense Refill   aspirin EC 81 MG tablet Take 1 tablet (81 mg total) by mouth 2 (two) times daily. 84 tablet 0   ergocalciferol (VITAMIN D2) 1.25 MG (50000 UT) capsule 1 (ONE) CAPSULE CAPSULE BY MOUTH ONCE A WEEK     gabapentin (NEURONTIN) 300 MG capsule Take 1 capsule (300 mg total) by mouth 3 (three) times daily. 90 capsule 0   lidocaine (LIDODERM) 5 % Place 1 patch onto the skin daily. Remove & Discard patch within 12 hours or as directed by MD 30 patch 0   NARCAN 4 MG/0.1ML LIQD nasal spray kit Place 1 spray into the nose as needed (opioid overdose).     oxyCODONE-acetaminophen (PERCOCET) 10-325 MG tablet Take 1 tablet by mouth 4 (four) times daily as needed.     sildenafil (REVATIO) 20 MG tablet TAKE 2 TO 3 TABLETS BY MOUTH ONCE DAILY AS NEEDED 90 tablet 2   zolpidem (AMBIEN) 10 MG tablet Take 10 mg by mouth at bedtime.     No current facility-administered medications for this visit.    PHYSICAL EXAMINATION:  ECOG PERFORMANCE STATUS: 0 - Asymptomatic   There were no vitals filed for this visit.   There were no vitals filed for this visit.    Physical Exam Vitals and nursing note reviewed.  Constitutional:      General: He is not in acute distress.     Appearance: Normal appearance. He is not ill-appearing or diaphoretic.     Comments: Here with wife  HENT:     Head: Normocephalic and atraumatic.     Right Ear: External ear normal.     Left Ear: External ear normal.     Nose: Nose normal.  Eyes:     General: No scleral icterus.    Conjunctiva/sclera: Conjunctivae normal.     Pupils: Pupils are equal, round, and reactive to light.  Cardiovascular:     Rate and Rhythm: Normal rate and regular rhythm.     Heart sounds: Normal heart sounds. No murmur heard.    No friction rub. No gallop.  Pulmonary:     Effort: Pulmonary effort is normal. No respiratory distress.     Breath sounds: Normal breath sounds. No wheezing.  Abdominal:     General: Abdomen is flat.     Palpations: Abdomen is soft.     Tenderness: There is no abdominal tenderness. There is no guarding or rebound.  Musculoskeletal:        General: No swelling or deformity.     Cervical back: Normal range of motion and neck supple. No rigidity or tenderness.     Right lower leg: No edema.     Left lower leg: No edema.     Comments: Ambulates with a cane  Lymphadenopathy:     Head:     Right side of head: No submental, submandibular, tonsillar, preauricular, posterior auricular or occipital adenopathy.     Left side of head: No submental, submandibular, tonsillar, preauricular, posterior auricular or occipital adenopathy.     Cervical: No cervical adenopathy.     Right cervical: No superficial, deep or posterior cervical adenopathy.    Left cervical: No superficial, deep or posterior cervical adenopathy.     Upper Body:     Right upper body: No supraclavicular or axillary adenopathy.     Left upper body: No supraclavicular or axillary adenopathy.     Lower Body: No right inguinal adenopathy. No left inguinal adenopathy.  Skin:    General: Skin is warm.     Coloration: Skin is not jaundiced.  Neurological:     General: No focal deficit present.     Mental Status: He is  alert and oriented to person, place, and time.     Gait: Gait normal.  Psychiatric:        Mood and Affect: Mood normal.        Behavior: Behavior normal.        Thought Content: Thought content normal.        Judgment: Judgment normal.      LABORATORY DATA: I have personally reviewed the data as listed:  Office Visit on 03/04/2023  Component Date Value Ref Range Status   Hemoglobin A1C 03/04/2023 5.6  4.0 - 5.6 % Final    RADIOGRAPHIC STUDIES: I have personally reviewed the radiological images as listed and agree with the findings in the report  No results found.  ASSESSMENT/PLAN  62 y.o. male is here because of anemia.  Medical history notable for cerebral palsy, cervical radiculopathy, right leg weakness, osteoarthritis left hip, tobacco use, erectile dysfunction, BPH, chronic pain  Anemia:  Etiology multifactorial with culprits being 1) Iron deficiency from occult GI blood loss exacerbated by antiplatelet therapy 2) Hemoglobin SA (Sickle cell trait)  April 03 2023- Refer for GI consult to evaluate iron deficiency.    Will hold on transfusion at this time since Hgb is not sufficiently low enough to warrant it.    Sickle cell trait, Hgb SA:  April 03 2023-  Unlike sickle cell disease, a serious illness in which patients have two genes that cause the production of abnormal hemoglobin (the substance in red blood cells that helps carry oxygen), individuals with sickle cell trait carry only one defective gene and typically live normal lives. Rarely, extreme conditions such as severe dehydration and high-intensity physical activity can lead to serious health issues, including sudden death, for individuals with sickle cell trait.  If an individual has sickle cell trait, it means that he or she carries or has inherited a single copy of the gene that causes sickle cell disease. It is not a disease. In general, people with sickle cell trait enjoy normal life spans with no medical problems  related to sickle cell trait.  Discussed this finding with patient.    Tobacco use  Feb 20 2023- Enrolled in [place] lung screening program    Cancer Staging  No matching staging information was found for the patient.   No problem-specific Assessment & Plan notes found for this encounter.    No orders of the defined types were placed in this encounter.   30  minutes was spent in patient care.  This included time spent preparing to see the patient (e.g., review of tests), obtaining and/or reviewing separately obtained history, counseling and educating the patient/family/caregiver, ordering medications, tests, or procedures; documenting clinical information in the electronic or other health record, independently interpreting results and communicating results to the patient/family/caregiver as well as coordination of care.       All questions were answered. The patient knows to call the clinic with any problems, questions or concerns.  This note was electronically signed.    Loni Muse, MD  04/02/2023 1:12 PM

## 2023-04-03 ENCOUNTER — Inpatient Hospital Stay: Payer: 59 | Attending: Oncology | Admitting: Oncology

## 2023-04-03 ENCOUNTER — Other Ambulatory Visit: Payer: Self-pay

## 2023-04-03 VITALS — BP 117/83 | HR 76 | Temp 98.3°F | Resp 16 | Wt 108.9 lb

## 2023-04-03 DIAGNOSIS — F17210 Nicotine dependence, cigarettes, uncomplicated: Secondary | ICD-10-CM | POA: Diagnosis not present

## 2023-04-03 DIAGNOSIS — D509 Iron deficiency anemia, unspecified: Secondary | ICD-10-CM

## 2023-04-03 DIAGNOSIS — D649 Anemia, unspecified: Secondary | ICD-10-CM | POA: Diagnosis not present

## 2023-04-03 DIAGNOSIS — D539 Nutritional anemia, unspecified: Secondary | ICD-10-CM

## 2023-04-03 DIAGNOSIS — Z7982 Long term (current) use of aspirin: Secondary | ICD-10-CM | POA: Diagnosis not present

## 2023-04-03 DIAGNOSIS — D573 Sickle-cell trait: Secondary | ICD-10-CM

## 2023-04-10 DIAGNOSIS — F17210 Nicotine dependence, cigarettes, uncomplicated: Secondary | ICD-10-CM | POA: Diagnosis not present

## 2023-04-10 DIAGNOSIS — M4722 Other spondylosis with radiculopathy, cervical region: Secondary | ICD-10-CM | POA: Diagnosis not present

## 2023-04-10 DIAGNOSIS — D649 Anemia, unspecified: Secondary | ICD-10-CM | POA: Diagnosis not present

## 2023-04-10 DIAGNOSIS — M25552 Pain in left hip: Secondary | ICD-10-CM | POA: Diagnosis not present

## 2023-04-10 DIAGNOSIS — G4700 Insomnia, unspecified: Secondary | ICD-10-CM | POA: Diagnosis not present

## 2023-04-10 DIAGNOSIS — G8929 Other chronic pain: Secondary | ICD-10-CM | POA: Diagnosis not present

## 2023-04-10 DIAGNOSIS — Z79899 Other long term (current) drug therapy: Secondary | ICD-10-CM | POA: Diagnosis not present

## 2023-04-10 DIAGNOSIS — R7303 Prediabetes: Secondary | ICD-10-CM | POA: Diagnosis not present

## 2023-04-14 DIAGNOSIS — H401231 Low-tension glaucoma, bilateral, mild stage: Secondary | ICD-10-CM | POA: Diagnosis not present

## 2023-04-27 ENCOUNTER — Encounter: Payer: 59 | Attending: Physical Medicine and Rehabilitation | Admitting: Physical Medicine and Rehabilitation

## 2023-04-27 ENCOUNTER — Encounter: Payer: Self-pay | Admitting: Physical Medicine and Rehabilitation

## 2023-04-27 VITALS — BP 132/80 | HR 68 | Ht 64.0 in | Wt 114.0 lb

## 2023-04-27 DIAGNOSIS — G894 Chronic pain syndrome: Secondary | ICD-10-CM | POA: Diagnosis not present

## 2023-04-27 DIAGNOSIS — Z79891 Long term (current) use of opiate analgesic: Secondary | ICD-10-CM | POA: Diagnosis not present

## 2023-04-27 DIAGNOSIS — Z09 Encounter for follow-up examination after completed treatment for conditions other than malignant neoplasm: Secondary | ICD-10-CM

## 2023-04-27 DIAGNOSIS — Z9889 Other specified postprocedural states: Secondary | ICD-10-CM | POA: Diagnosis not present

## 2023-04-27 DIAGNOSIS — Z96642 Presence of left artificial hip joint: Secondary | ICD-10-CM

## 2023-04-27 DIAGNOSIS — M961 Postlaminectomy syndrome, not elsewhere classified: Secondary | ICD-10-CM

## 2023-04-27 DIAGNOSIS — Z5181 Encounter for therapeutic drug level monitoring: Secondary | ICD-10-CM | POA: Diagnosis not present

## 2023-04-27 DIAGNOSIS — M21372 Foot drop, left foot: Secondary | ICD-10-CM

## 2023-04-27 DIAGNOSIS — G4700 Insomnia, unspecified: Secondary | ICD-10-CM

## 2023-04-27 MED ORDER — ZOLPIDEM TARTRATE 5 MG PO TABS: 5.0000 mg | ORAL_TABLET | Freq: Every evening | ORAL | 0 refills | Status: DC | PRN

## 2023-04-27 NOTE — Progress Notes (Signed)
Subjective:    Patient ID: Philip Bates, male    DOB: 1961/04/22, 62 y.o.   MRN: [id number]  HPI  Philip Bates is a very pleasant 62 year old man who presents to establish care for chronic pain.  1) Chronic pain - he is currently receiving care at [facility] -he has been taking percocet 10,g-325mg  QID prn, gabapentin  - he has had surgery in his back, neck, and left shoulder   2) Headache:  -he takes topamax  3) Insomnia: -has difficulty sleeping at night  Pain Inventory Average Pain 8 Pain Right Now 8 My pain is constant, sharp, burning, and aching  In the last 24 hours, has pain interfered with the following? General activity 8 Relation with others 8 Enjoyment of life 10 What TIME of day is your pain at its worst? morning , daytime, evening, and night Sleep (in general) Poor  Pain is worse with: walking, sitting, standing, and some activites Pain improves with: rest and medication Relief from Meds: 8  walk with assistance use a cane how many minutes can you walk? 15 ability to climb steps?  yes do you drive?  yes Do you have any goals in this area?  yes  Disabled 2012  bladder control problems weakness numbness tingling trouble walking spasms  none  New patient    Family History  Problem Relation Age of Onset   Cancer Father        esophagus   Diabetes Brother    Diabetes Paternal Uncle    Social History   Socioeconomic History   Marital status: Married    Spouse name: Not on file   Number of children: Not on file   Years of education: Not on file   Highest education level: Some college, no degree  Occupational History   Not on file  Tobacco Use   Smoking status: Every Day    Current packs/day: 1.00    Average packs/day: 1 pack/day for 35.0 years (35.0 ttl pk-yrs)    Types: Cigarettes   Smokeless tobacco: Never  Vaping Use   Vaping status: Never Used  Substance and Sexual Activity   Alcohol use: Never   Drug use: Never   Sexual  activity: Yes  Other Topics Concern   Not on file  Social History Narrative   ** Merged History Encounter **       Social Determinants of Health   Financial Resource Strain: Low Risk  (04/16/2022)   Overall Financial Resource Strain (CARDIA)    Difficulty of Paying Living Expenses: Not hard at all  Food Insecurity: Food Insecurity Present (04/21/2022)   Hunger Vital Sign    Worried About Running Out of Food in the Last Year: Sometimes true    Ran Out of Food in the Last Year: Sometimes true  Transportation Needs: No Transportation Needs (04/21/2022)   PRAPARE - Administrator, Civil Service (Medical): No    Lack of Transportation (Non-Medical): No  Physical Activity: Sufficiently Active (04/21/2022)   Exercise Vital Sign    Days of Exercise per Week: 2 days    Minutes of Exercise per Session: 140 min  Recent Concern: Physical Activity - Insufficiently Active (04/16/2022)   Exercise Vital Sign    Days of Exercise per Week: 2 days    Minutes of Exercise per Session: 30 min  Stress: Stress Concern Present (04/21/2022)   [employer] of Occupational Health - Occupational Stress Questionnaire    Feeling of Stress : To some  extent  Social Connections: Unknown (01/07/2023)   Received from [facility]   Social Network    Social Network: Not on file   Past Surgical History:  Procedure Laterality Date   ANTERIOR CERVICAL DECOMPRESSION/DISCECTOMY FUSION 4 LEVELS N/A 08/11/2017   Procedure: Cervical three to Cervical seven Anterior Cervical discectomy with fusion/plate fixation;  Surgeon: Ditty, Loura Halt, MD;  Location: [facility] OR;  Service: Neurosurgery;  Laterality: N/A;   BACK SURGERY     HERNIA REPAIR     inguinal hernia repair in early 2000   LEG SURGERY     POSTERIOR CERVICAL FUSION/FORAMINOTOMY N/A 08/12/2018   Procedure: POSTERIOR CERVICAL FUSION WITH LATERAL MASS FIXATION CERVICAL THREE TO CERVICAL SEVEN;  Surgeon: Lisbeth Renshaw, MD;  Location: [facility] OR;  Service:  Neurosurgery;  Laterality: N/A;   SHOULDER ARTHROSCOPY WITH ROTATOR CUFF REPAIR AND SUBACROMIAL DECOMPRESSION Right 02/02/2018   Procedure: RIGHT SHOULDER ARTHROSCOPY WITH SUBACROMIAL DECOMPRESSION, MINI-OPEN ROTATOR CUFF REPAIR;  Surgeon: Cammy Copa, MD;  Location: [facility] OR;  Service: Orthopedics;  Laterality: Right;   SHOULDER SURGERY Right    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   Procedure: LEFT TOTAL HIP ARTHROPLASTY ANTERIOR APPROACH;  Surgeon: Tarry Kos, MD;  Location: [facility] OR;  Service: Orthopedics;  Laterality: Left;   Past Medical History:  Diagnosis Date   Arthritis    Cerebral palsy (HCC)    Cervical radiculopathy    H/O umbilical hernia repair 2001   Pre-diabetes    Scoliosis    Spondylosis of cervical spine    There were no vitals taken for this visit.  Opioid Risk Score:   Fall Risk Score:  `1  Depression screen [facility] 2/9     03/04/2023   10:50 AM 04/21/2022    2:13 PM 04/16/2022    2:26 PM 03/17/2022    9:54 AM 04/30/2021   12:23 PM 04/09/2021    3:24 PM 06/15/2020    4:38 PM  Depression screen PHQ 2/9  Decreased Interest 2 2 0 2 2 0 0  Down, Depressed, Hopeless 1 1 0 3 3 1  0  PHQ - 2 Score 3 3 0 5 5 1  0  Altered sleeping 3 2 0 3 3    Tired, decreased energy 2 2 0 2 2    Change in appetite 1 2 0 1 1    Feeling bad or failure about yourself  0 0 0 2 2    Trouble concentrating 1 0 0 2 2    Moving slowly or fidgety/restless 0 0 0 0 1    Suicidal thoughts 0 0 0 0 0    PHQ-9 Score 10 9 0 15 16    Difficult doing work/chores Somewhat difficult Somewhat difficult   Very difficult        Review of Systems  Constitutional:        Wt loss poor appetite  Musculoskeletal:  Positive for back pain and neck pain.       Lt hip leg Rt shoulder  All other systems reviewed and are negative.      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 Discussed that these lifestyle strategies are also very good for health unlike some medications which can have negative side effects. Discussed that the act of keeping a journal can be therapeutic and helpful to realize patterns what helps to trigger and alleviate pain.    -UDS and pain contract performed today  2) Dropped foot, left: continue use of cane -discussed brace at [place] but she defers  3) Insomnia; -topamax prescribed -ambien 5mg  prescribed to help him wean off

## 2023-04-30 LAB — TOXASSURE SELECT,+ANTIDEPR,UR

## 2023-05-01 ENCOUNTER — Other Ambulatory Visit: Payer: Self-pay | Admitting: Family Medicine

## 2023-05-01 DIAGNOSIS — N529 Male erectile dysfunction, unspecified: Secondary | ICD-10-CM

## 2023-05-08 ENCOUNTER — Telehealth: Payer: Self-pay | Admitting: Physical Medicine and Rehabilitation

## 2023-05-08 NOTE — Telephone Encounter (Signed)
Patient called in requesting medication refill on oxycodone, patient states he will run out in 2 days and would like rx sent to [facility] on [address]

## 2023-05-09 ENCOUNTER — Other Ambulatory Visit: Payer: Self-pay | Admitting: Physical Medicine and Rehabilitation

## 2023-05-09 MED ORDER — OXYCODONE-ACETAMINOPHEN 10-325 MG PO TABS: 1.0000 | ORAL_TABLET | Freq: Four times a day (QID) | ORAL | 0 refills | Status: DC | PRN

## 2023-05-19 ENCOUNTER — Telehealth: Payer: 59 | Admitting: Family Medicine

## 2023-05-21 ENCOUNTER — Encounter (INDEPENDENT_AMBULATORY_CARE_PROVIDER_SITE_OTHER): Payer: Self-pay

## 2023-05-21 ENCOUNTER — Telehealth: Payer: 59 | Admitting: Nurse Practitioner

## 2023-05-21 DIAGNOSIS — Z20822 Contact with and (suspected) exposure to covid-19: Secondary | ICD-10-CM

## 2023-05-21 NOTE — Progress Notes (Signed)
   Patient is looking for COVID testing assistance, unable to afford to purchase home testing due to cost  Wife currently has COVID tested positive in ED Advised to visit Urgent Care  Checked wait times with patient and he will go to [facility] for help with testing and treatment as needed   Patient is agreeable to plan

## 2023-05-26 ENCOUNTER — Encounter: Payer: 59 | Attending: Physical Medicine and Rehabilitation | Admitting: Registered Nurse

## 2023-05-26 VITALS — BP 125/81 | HR 69 | Ht 64.0 in | Wt 111.0 lb

## 2023-05-26 DIAGNOSIS — M542 Cervicalgia: Secondary | ICD-10-CM | POA: Insufficient documentation

## 2023-05-26 DIAGNOSIS — M961 Postlaminectomy syndrome, not elsewhere classified: Secondary | ICD-10-CM | POA: Insufficient documentation

## 2023-05-26 DIAGNOSIS — M25511 Pain in right shoulder: Secondary | ICD-10-CM | POA: Diagnosis not present

## 2023-05-26 DIAGNOSIS — M25552 Pain in left hip: Secondary | ICD-10-CM | POA: Insufficient documentation

## 2023-05-26 DIAGNOSIS — M25562 Pain in left knee: Secondary | ICD-10-CM | POA: Insufficient documentation

## 2023-05-26 DIAGNOSIS — M5412 Radiculopathy, cervical region: Secondary | ICD-10-CM | POA: Diagnosis not present

## 2023-05-26 DIAGNOSIS — G8929 Other chronic pain: Secondary | ICD-10-CM | POA: Diagnosis not present

## 2023-05-26 DIAGNOSIS — M545 Low back pain, unspecified: Secondary | ICD-10-CM | POA: Insufficient documentation

## 2023-05-26 DIAGNOSIS — M21372 Foot drop, left foot: Secondary | ICD-10-CM | POA: Insufficient documentation

## 2023-05-26 MED ORDER — OXYCODONE-ACETAMINOPHEN 10-325 MG PO TABS: 1.0000 | ORAL_TABLET | Freq: Four times a day (QID) | ORAL | 0 refills | Status: DC | PRN

## 2023-05-26 NOTE — Progress Notes (Signed)
Subjective:    Patient ID: Philip Bates, male    DOB: 03-29-1961, 62 y.o.   MRN: [id number]  HPI: Philip Bates is a 62 y.o. male who returns for follow up appointment for chronic pain and medication refill. He states his pain is located in his neck radiating into her bilateral shoulders, lower back pain, left hip and left knee pain. He rates his pain 8. His current exercise regime is walking.   [name] Morphine equivalent is 60.00 MME.   Last UDS was Performed on 04/27/2023, it was consistent  [name] past surgical history:  On 02/02/2018: Dr August Saucer   RIGHT SHOULDER ARTHROSCOPY WITH SUBACROMIAL DECOMPRESSION, MINI-OPEN ROTATOR CUFF REPAIR  On 08/12/2028: Dr Conchita Paris POSTERIOR CERVICAL FUSION WITH LATERAL MASS FIXATION CERVICAL THREE TO CERVICAL SEVEN  On 09/10/2020: Dr Roda Shutters LEFT TOTAL HIP ARTHROPLASTY ANTERIOR APPROACH   Pain Inventory Average Pain 8 Pain Right Now 8 My pain is constant, burning, and aching  In the last 24 hours, has pain interfered with the following? General activity 5 Relation with others 5 Enjoyment of life 6 What TIME of day is your pain at its worst? morning , daytime, evening, and night Sleep (in general) Poor  Pain is worse with: walking, sitting, standing, and some activites Pain improves with: rest and medication Relief from Meds: 9  Family History  Problem Relation Age of Onset   Cancer Father        esophagus   Diabetes Brother    Diabetes Paternal Uncle    Social History   Socioeconomic History   Marital status: Married    Spouse name: Not on file   Number of children: Not on file   Years of education: Not on file   Highest education level: Some college, no degree  Occupational History   Not on file  Tobacco Use   Smoking status: Every Day    Current packs/day: 1.00    Average packs/day: 1 pack/day for 35.0 years (35.0 ttl pk-yrs)    Types: Cigarettes   Smokeless tobacco: Never  Vaping Use   Vaping status: Never Used  Substance  and Sexual Activity   Alcohol use: Never   Drug use: Never   Sexual activity: Yes  Other Topics Concern   Not on file  Social History Narrative   ** Merged History Encounter **       Social Determinants of Health   Financial Resource Strain: Low Risk  (04/16/2022)   Overall Financial Resource Strain (CARDIA)    Difficulty of Paying Living Expenses: Not hard at all  Food Insecurity: Food Insecurity Present (04/21/2022)   Hunger Vital Sign    Worried About Running Out of Food in the Last Year: Sometimes true    Ran Out of Food in the Last Year: Sometimes true  Transportation Needs: No Transportation Needs (04/21/2022)   PRAPARE - Administrator, Civil Service (Medical): No    Lack of Transportation (Non-Medical): No  Physical Activity: Sufficiently Active (04/21/2022)   Exercise Vital Sign    Days of Exercise per Week: 2 days    Minutes of Exercise per Session: 140 min  Recent Concern: Physical Activity - Insufficiently Active (04/16/2022)   Exercise Vital Sign    Days of Exercise per Week: 2 days    Minutes of Exercise per Session: 30 min  Stress: Stress Concern Present (04/21/2022)   [employer] of Occupational Health - Occupational Stress Questionnaire    Feeling of Stress : To some  extent  Social Connections: Unknown (01/07/2023)   Received from [facility], [facility]   Social Network    Social Network: Not on file   Past Surgical History:  Procedure Laterality Date   ANTERIOR CERVICAL DECOMPRESSION/DISCECTOMY FUSION 4 LEVELS N/A 08/11/2017   Procedure: Cervical three to Cervical seven Anterior Cervical discectomy with fusion/plate fixation;  Surgeon: Ditty, Loura Halt, MD;  Location: [facility] OR;  Service: Neurosurgery;  Laterality: N/A;   BACK SURGERY     HERNIA REPAIR     inguinal hernia repair in early 2000   LEG SURGERY     POSTERIOR CERVICAL FUSION/FORAMINOTOMY N/A 08/12/2018   Procedure: POSTERIOR CERVICAL FUSION WITH LATERAL MASS FIXATION CERVICAL  THREE TO CERVICAL SEVEN;  Surgeon: Lisbeth Renshaw, MD;  Location: [facility] OR;  Service: Neurosurgery;  Laterality: N/A;   SHOULDER ARTHROSCOPY WITH ROTATOR CUFF REPAIR AND SUBACROMIAL DECOMPRESSION Right 02/02/2018   Procedure: RIGHT SHOULDER ARTHROSCOPY WITH SUBACROMIAL DECOMPRESSION, MINI-OPEN ROTATOR CUFF REPAIR;  Surgeon: Cammy Copa, MD;  Location: [facility] OR;  Service: Orthopedics;  Laterality: Right;   SHOULDER SURGERY Right    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   Procedure: LEFT TOTAL HIP ARTHROPLASTY ANTERIOR APPROACH;  Surgeon: Tarry Kos, MD;  Location: [facility] OR;  Service: Orthopedics;  Laterality: Left;   Past Surgical History:  Procedure Laterality Date   ANTERIOR CERVICAL DECOMPRESSION/DISCECTOMY FUSION 4 LEVELS N/A 08/11/2017   Procedure: Cervical three to Cervical seven Anterior Cervical discectomy with fusion/plate fixation;  Surgeon: Ditty, Loura Halt, MD;  Location: [facility] OR;  Service: Neurosurgery;  Laterality: N/A;   BACK SURGERY     HERNIA REPAIR     inguinal hernia repair in early 2000   LEG SURGERY     POSTERIOR CERVICAL FUSION/FORAMINOTOMY N/A 08/12/2018   Procedure: POSTERIOR CERVICAL FUSION WITH LATERAL MASS FIXATION CERVICAL THREE TO CERVICAL SEVEN;  Surgeon: Lisbeth Renshaw, MD;  Location: [facility] OR;  Service: Neurosurgery;  Laterality: N/A;   SHOULDER ARTHROSCOPY WITH ROTATOR CUFF REPAIR AND SUBACROMIAL DECOMPRESSION Right 02/02/2018   Procedure: RIGHT SHOULDER ARTHROSCOPY WITH SUBACROMIAL DECOMPRESSION, MINI-OPEN ROTATOR CUFF REPAIR;  Surgeon: Cammy Copa, MD;  Location: [facility] OR;  Service: Orthopedics;  Laterality: Right;   SHOULDER SURGERY Right    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   Procedure: LEFT TOTAL HIP ARTHROPLASTY ANTERIOR APPROACH;  Surgeon: Tarry Kos, MD;  Location: [facility] OR;  Service: Orthopedics;  Laterality: Left;   Past Medical History:  Diagnosis Date   Arthritis    Cerebral palsy (HCC)    Cervical radiculopathy    H/O umbilical  hernia repair 2001   Pre-diabetes    Scoliosis    Spondylosis of cervical spine    BP 125/81   Pulse 69   Ht 5\' 4"  (1.626 m)   Wt 111 lb (50.3 kg)   SpO2 98%   BMI 19.05 kg/m   Opioid Risk Score:   Fall Risk Score:  `1  Depression screen [facility] 2/9     04/27/2023    9:35 AM 03/04/2023   10:50 AM 04/21/2022    2:13 PM 04/16/2022    2:26 PM 03/17/2022    9:54 AM 04/30/2021   12:23 PM 04/09/2021    3:24 PM  Depression screen PHQ 2/9  Decreased Interest 2 2 2  0 2 2 0  Down, Depressed, Hopeless 1 1 1  0 3 3 1   PHQ - 2 Score 3 3 3  0 5 5 1   Altered sleeping 3 3 2  0 3 3   Tired, decreased  energy 2 2 2  0 2 2   Change in appetite 3 1 2  0 1 1   Feeling bad or failure about yourself  1 0 0 0 2 2   Trouble concentrating 1 1 0 0 2 2   Moving slowly or fidgety/restless 0 0 0 0 0 1   Suicidal thoughts 0 0 0 0 0 0   PHQ-9 Score 13 10 9  0 15 16   Difficult doing work/chores  Somewhat difficult Somewhat difficult   Very difficult     Review of Systems  Musculoskeletal:  Positive for back pain.       B/L shoulder LT leg LT hip pain  All other systems reviewed and are negative.      Objective:   Physical Exam Vitals and nursing note reviewed.  Constitutional:      Appearance: Normal appearance.  Neck:     Comments: Cervical Paraspinal Tenderness: C-5-C-6 Cardiovascular:     Rate and Rhythm: Normal rate and regular rhythm.     Pulses: Normal pulses.     Heart sounds: Normal heart sounds.  Pulmonary:     Effort: Pulmonary effort is normal.     Breath sounds: Normal breath sounds.  Musculoskeletal:     Cervical back: Normal range of motion and neck supple.     Comments: Normal Muscle Bulk and Muscle Testing Reveals:  Upper Extremities: Full ROM and Muscle Strength 5/5 Bilateral AC Joint Tenderness Lumbar Paraspinal Tenderness: L-4-L-5 Left Greater Trochanter Tenderness Lower Extremities: Right: Full ROM and Muscle Strength 5/5 Left Lower Extremity: Decreased ROM and Muscle Strength  5/5 Left Lower Extremity Flexion Produces Pain into her left Patella Wearing AFO Arises from Table slowly using cane for support Antalgic Gait     Skin:    General: Skin is warm and dry.  Neurological:     Mental Status: He is alert and oriented to person, place, and time.  Psychiatric:        Mood and Affect: Mood normal.        Behavior: Behavior normal.           Assessment & Plan:  Cervicalgia/ Cervical Radiculitis: Continue HEP as Tolerated. Continue to Monitor. Continue Gabapentin. Continue to Monitor.  Chronic Right Shoulder Pain:  Continue HEP as Tolerated. Continue to Monitor. Ortho Following.  Failed Back Syndrome: Continue HEP as Tolerated. Continue to Monitor.  Chronic Bilateral Low Back Pain without Sciatica: Continue HEP as Tolerated. Continue current medication regimen. Continue to Monitor.  Chronic Left Hip Pain: S/P On 09/10/2020: Dr Roda Shutters LEFT TOTAL HIP ARTHROPLASTY ANTERIOR APPROACH Continue HEP as Tolerated. Continue current medication regimen. Continue to Monitor.  Chronic Left knee Pain: Continue HEP as Tolerated. Continue to Monitor.  Left Foot Drop: Wearing AFO: [employer] Following. Continue to Monitor.  Chronic Pain Syndrome: Oxycodone 10/325 mg one tablet 4 times a day as needed for pain #120. We will continue the opioid monitoring program, this consists of regular clinic visits, examinations, urine drug screen, pill counts as well as use of [place] Controlled Substance Reporting system. A 12 month History has been reviewed on the [place] Controlled Substance Reporting System on 05/26/2023  F/U in 1 month

## 2023-05-28 ENCOUNTER — Encounter: Payer: Self-pay | Admitting: Physician Assistant

## 2023-05-29 ENCOUNTER — Encounter: Payer: Self-pay | Admitting: Family Medicine

## 2023-05-29 ENCOUNTER — Ambulatory Visit (INDEPENDENT_AMBULATORY_CARE_PROVIDER_SITE_OTHER): Payer: 59 | Admitting: Family Medicine

## 2023-05-29 VITALS — BP 139/90 | HR 70 | Temp 98.7°F | Resp 16 | Ht 64.8 in | Wt 113.4 lb

## 2023-05-29 DIAGNOSIS — G894 Chronic pain syndrome: Secondary | ICD-10-CM

## 2023-05-29 DIAGNOSIS — G4701 Insomnia due to medical condition: Secondary | ICD-10-CM

## 2023-05-29 DIAGNOSIS — R030 Elevated blood-pressure reading, without diagnosis of hypertension: Secondary | ICD-10-CM

## 2023-05-29 DIAGNOSIS — I739 Peripheral vascular disease, unspecified: Secondary | ICD-10-CM

## 2023-05-29 MED ORDER — TRAZODONE HCL 50 MG PO TABS
50.0000 mg | ORAL_TABLET | Freq: Every day | ORAL | 3 refills | Status: DC
Start: 2023-05-29 — End: 2023-06-12

## 2023-05-29 NOTE — Patient Instructions (Addendum)
A few things to remember from today's visit:  Insomnia due to medical condition  PAD (peripheral artery disease) (HCC) - Plan: VAS US CAROTID  Trazodone 50 mg 1/2 tab 30-60 min before bedtime and can increase it to 50 mg in 5-7 days. Let me know if it is helping in about 4 weeks, we can increase it if needed. Monitor blood pressure at home and let me know about readings in 4 weeks.  If you need refills for medications you take chronically, please call your pharmacy. Do not use My Chart to request refills or for acute issues that need immediate attention. If you send a my chart message, it may take a few days to be addressed, specially if I am not in the office.  Please be sure medication list is accurate. If a new problem present, please set up appointment sooner than planned today.

## 2023-05-29 NOTE — Progress Notes (Unsigned)
ACUTE VISIT Chief Complaint  Patient presents with   Form Completion    Pt states he would also like to discuss medication for sleep   HPI: Philip Bates is a 62 y.o. male, who is here today complaining of difficulty sleeping. He reports trouble falling asleep and staying asleep, almost every day. He estimates average 2 hours of sleep per night, some nights with zero sleep. He has tried Zolpidem, Valium, gabapentin, and Lyrica for sleep, with varying success. He is on chronic opioid use and has been advised top avoid Valium and Ambien. He is on Percocet and Gabapentin.  He denies diagnosis of bipolar disorder and does not believe anxiety is playing a role in sleep issues.    Cerebral palsy on disability and planning on going back to school, so needs form completed.  He continues smoking. Since his last visit he has undergone a lung cancer screening, with results pending. He has reported 15% carotid artery blockage on carotid ultrasound 2 years ago. He is not on statin or aspirin. He is also following with hematologist due to anemia, history of sickle cell trait. He has upcoming appointment with gastroenterologist for EGD.  Lab Results  Component Value Date   WBC 5.2 02/20/2023   HGB 13.4 02/20/2023   HCT 38.3 (L) 02/20/2023   MCV 78.2 (L) 02/20/2023   PLT 255 02/20/2023   BP mildly elevated today. No hx of HTN.  Occipital headaches and neck pain, chronic. No associated nausea,vomiting, or visual changes. Lab Results  Component Value Date   NA 139 02/20/2023   CL 105 02/20/2023   K 3.8 02/20/2023   CO2 29 02/20/2023   BUN 13 02/20/2023   CREATININE 0.65 02/20/2023   GFRNONAA >60 02/20/2023   CALCIUM 9.3 02/20/2023   ALBUMIN 4.6 02/20/2023   GLUCOSE 83 02/20/2023   Review of Systems  Constitutional:  Negative for activity change, appetite change, chills and fever.  HENT:  Negative for nosebleeds and sore throat.   Respiratory:  Negative for cough, shortness of  breath and wheezing.   Cardiovascular:  Negative for chest pain, palpitations and leg swelling.  Gastrointestinal:  Negative for abdominal pain, nausea and vomiting.  Genitourinary:  Negative for decreased urine volume, dysuria and hematuria.  Musculoskeletal:  Positive for arthralgias, gait problem, myalgias and neck pain.  Skin:  Negative for rash.  Neurological:  Negative for syncope and facial asymmetry.  Psychiatric/Behavioral:  Negative for confusion and hallucinations. The patient is not nervous/anxious.   See other pertinent positives and negatives in HPI.  Current Outpatient Medications on File Prior to Visit  Medication Sig Dispense Refill   aspirin EC 81 MG tablet Take 1 tablet (81 mg total) by mouth 2 (two) times daily. 84 tablet 0   ergocalciferol (VITAMIN D2) 1.25 MG (50000 UT) capsule 1 (ONE) CAPSULE CAPSULE BY MOUTH ONCE A WEEK     gabapentin (NEURONTIN) 300 MG capsule Take 1 capsule (300 mg total) by mouth 3 (three) times daily. 90 capsule 0   ibuprofen (ADVIL) 800 MG tablet Take 800 mg by mouth 3 (three) times daily as needed.     latanoprost (XALATAN) 0.005 % ophthalmic solution SMARTSIG:1 Drop(s) In Eye(s) Every Evening     NARCAN 4 MG/0.1ML LIQD nasal spray kit Place 1 spray into the nose as needed (opioid overdose).     oxyCODONE-acetaminophen (PERCOCET) 10-325 MG tablet Take 1 tablet by mouth 4 (four) times daily as needed. Do Not Fill Before 06/07/2023 120 tablet 0  sildenafil (REVATIO) 20 MG tablet TAKE 2 TO 3 TABLETS BY MOUTH ONCE DAILY AS NEEDED 90 tablet 3   zolpidem (AMBIEN) 10 MG tablet Take 10 mg by mouth at bedtime.     zolpidem (AMBIEN) 5 MG tablet Take 1 tablet (5 mg total) by mouth at bedtime as needed for sleep. 30 tablet 0   No current facility-administered medications on file prior to visit.   Past Medical History:  Diagnosis Date   Arthritis    Cerebral palsy (HCC)    Cervical radiculopathy    H/O umbilical hernia repair 2001   Pre-diabetes     Scoliosis    Spondylosis of cervical spine    No Known Allergies  Social History   Socioeconomic History   Marital status: Married    Spouse name: Not on file   Number of children: Not on file   Years of education: Not on file   Highest education level: Some college, no degree  Occupational History   Not on file  Tobacco Use   Smoking status: Every Day    Current packs/day: 1.00    Average packs/day: 1 pack/day for 35.0 years (35.0 ttl pk-yrs)    Types: Cigarettes   Smokeless tobacco: Never  Vaping Use   Vaping status: Never Used  Substance and Sexual Activity   Alcohol use: Never   Drug use: Never   Sexual activity: Yes  Other Topics Concern   Not on file  Social History Narrative   ** Merged History Encounter **       Social Determinants of Health   Financial Resource Strain: Medium Risk (05/28/2023)   Overall Financial Resource Strain (CARDIA)    Difficulty of Paying Living Expenses: Somewhat hard  Food Insecurity: Food Insecurity Present (05/28/2023)   Hunger Vital Sign    Worried About Running Out of Food in the Last Year: Sometimes true    Ran Out of Food in the Last Year: Sometimes true  Transportation Needs: Unmet Transportation Needs (05/28/2023)   PRAPARE - Transportation    Lack of Transportation (Medical): No    Lack of Transportation (Non-Medical): Yes  Physical Activity: Insufficiently Active (05/28/2023)   Exercise Vital Sign    Days of Exercise per Week: 1 day    Minutes of Exercise per Session: 10 min  Stress: Stress Concern Present (05/28/2023)   [employer] of Occupational Health - Occupational Stress Questionnaire    Feeling of Stress : Very much  Social Connections: Moderately Isolated (05/28/2023)   Social Connection and Isolation Panel [NHANES]    Frequency of Communication with Friends and Family: Once a week    Frequency of Social Gatherings with Friends and Family: Once a week    Attends Religious Services: 1 to 4 times per year     Active Member of [employer] or Organizations: No    Attends Banker Meetings: Not on file    Marital Status: Married   Vitals:   05/29/23 0942 05/29/23 1037  BP: (!) 138/90 (!) 139/90  Pulse: 70   Resp: 16   Temp: 98.7 F (37.1 C)   SpO2: 100%    Body mass index is 18.98 kg/m.  Physical Exam Vitals and nursing note reviewed.  Constitutional:      General: He is not in acute distress.    Appearance: He is well-developed.  HENT:     Head: Normocephalic and atraumatic.     Mouth/Throat:     Mouth: Mucous membranes are moist.  Eyes:  Conjunctiva/sclera: Conjunctivae normal.  Cardiovascular:     Rate and Rhythm: Normal rate and regular rhythm.     Heart sounds: No murmur heard. Pulmonary:     Effort: Pulmonary effort is normal. No respiratory distress.     Breath sounds: Normal breath sounds.  Abdominal:     Palpations: Abdomen is soft. There is no mass.     Tenderness: There is no abdominal tenderness.  Musculoskeletal:     Cervical back: Pain with movement present. Decreased range of motion.     Comments: LE contractures, leg length discrepancy,and limping.  Skin:    General: Skin is warm.     Findings: No erythema or rash.  Neurological:     Mental Status: He is alert and oriented to person, place, and time.     Cranial Nerves: No cranial nerve deficit.     Comments: Antalgic gait, unstable,assisted by a cane.  Psychiatric:        Mood and Affect: Mood and affect normal.   ASSESSMENT AND PLAN:  Mr. Gelfand was seen today for insomnia.  Insomnia due to medical condition Good sleep hygiene encouraged. We discussed options, he agrees with trying Trazodone, starting 25 mg at bedtime and can increase to 50 mg in a weeks.  -     traZODone HCl; Take 1 tablet (50 mg total) by mouth at bedtime.  Dispense: 30 tablet; Refill: 3  PAD (peripheral artery disease) (HCC) I did not get report of his last carotid Doppler US. He is not on statin or antiplatelet agent,  will hold on these until carotid US is repeated. Encouraged smocking cessation.  -     VAS US CAROTID; Future  Elevated blood pressure reading Re-checked, still mildly elevated. Instructed to monitor BP at home and to let me know about readings in 3-4 weeks. Appropriate technique discussed.  Chronic pain disorder Currently on Percocet and Gabapentin. Follows with pain management. He also sees ortho for hip pain.  School form completed and signed, gave it back to pt.   Return in about 4 months (around 09/28/2023) for chronic problems.  Philip Laufer G. [place], MD  [facility] [place] office.

## 2023-06-02 ENCOUNTER — Telehealth (HOSPITAL_BASED_OUTPATIENT_CLINIC_OR_DEPARTMENT_OTHER): Payer: Self-pay | Admitting: *Deleted

## 2023-06-02 ENCOUNTER — Encounter (HOSPITAL_BASED_OUTPATIENT_CLINIC_OR_DEPARTMENT_OTHER): Payer: Self-pay

## 2023-06-02 NOTE — Telephone Encounter (Signed)
Left message for patient to call and discuss scheduling the Carotid Duplex ordered by Dr. Betty [place]

## 2023-06-02 NOTE — Telephone Encounter (Signed)
Patient following up with you 

## 2023-06-03 ENCOUNTER — Encounter: Payer: Self-pay | Admitting: Registered Nurse

## 2023-06-05 ENCOUNTER — Other Ambulatory Visit: Payer: Self-pay

## 2023-06-05 ENCOUNTER — Ambulatory Visit: Payer: 59 | Admitting: Orthopaedic Surgery

## 2023-06-05 DIAGNOSIS — M79672 Pain in left foot: Secondary | ICD-10-CM

## 2023-06-05 DIAGNOSIS — Z96642 Presence of left artificial hip joint: Secondary | ICD-10-CM | POA: Diagnosis not present
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
## 2023-06-09 ENCOUNTER — Encounter: Payer: 59 | Admitting: Family Medicine

## 2023-06-12 ENCOUNTER — Ambulatory Visit (INDEPENDENT_AMBULATORY_CARE_PROVIDER_SITE_OTHER): Payer: 59

## 2023-06-12 ENCOUNTER — Other Ambulatory Visit: Payer: Self-pay | Admitting: Family Medicine

## 2023-06-12 VITALS — Ht 64.8 in | Wt 113.0 lb

## 2023-06-12 DIAGNOSIS — Z0000 Encounter for general adult medical examination without abnormal findings: Secondary | ICD-10-CM | POA: Diagnosis not present

## 2023-06-12 DIAGNOSIS — Z122 Encounter for screening for malignant neoplasm of respiratory organs: Secondary | ICD-10-CM

## 2023-06-12 NOTE — Progress Notes (Signed)
Subjective:   Philip Bates is a 62 y.o. male who presents for Medicare Annual/Subsequent preventive examination.  Visit Complete: Virtual  I connected with  [name] on 06/12/23 by a audio enabled telemedicine application and verified that I am speaking with the correct person using two identifiers.  Patient Location: Home  Provider Location: Home Office  I discussed the limitations of evaluation and management by telemedicine. The patient expressed understanding and agreed to proceed.   Review of Systems    Vital Signs: Unable to obtain new vitals due to this being a telehealth visit.  Cardiac Risk Factors include: advanced age (>73men, >54 women);male gender;smoking/ tobacco exposure     Objective:    Today's Vitals   06/12/23 1021  Weight: 113 lb (51.3 kg)  Height: 5' 4.8" (1.646 m)   Body mass index is 18.92 kg/m.     06/12/2023   10:31 AM 12/28/2022    1:58 PM 04/16/2022    2:32 PM 04/09/2021    3:28 PM 03/01/2021   11:39 AM 09/06/2020    1:11 PM 08/13/2020   11:40 PM  Advanced Directives  Does Patient Have a Medical Advance Directive? No No No Yes No No No  Would patient like information on creating a medical advance directive? No - Patient declined No - Patient declined No - Patient declined Yes (MAU/Ambulatory/Procedural Areas - Information given) No - Patient declined No - Patient declined     Current Medications (verified) Outpatient Encounter Medications as of 06/12/2023  Medication Sig   aspirin EC 81 MG tablet Take 1 tablet (81 mg total) by mouth 2 (two) times daily.   ergocalciferol (VITAMIN D2) 1.25 MG (50000 UT) capsule 1 (ONE) CAPSULE CAPSULE BY MOUTH ONCE A WEEK   gabapentin (NEURONTIN) 300 MG capsule Take 1 capsule (300 mg total) by mouth 3 (three) times daily.   ibuprofen (ADVIL) 800 MG tablet Take 800 mg by mouth 3 (three) times daily as needed.   latanoprost (XALATAN) 0.005 % ophthalmic solution SMARTSIG:1 Drop(s) In Eye(s) Every Evening   NARCAN 4  MG/0.1ML LIQD nasal spray kit Place 1 spray into the nose as needed (opioid overdose).   oxyCODONE-acetaminophen (PERCOCET) 10-325 MG tablet Take 1 tablet by mouth 4 (four) times daily as needed. Do Not Fill Before 06/07/2023   sildenafil (REVATIO) 20 MG tablet TAKE 2 TO 3 TABLETS BY MOUTH ONCE DAILY AS NEEDED   [DISCONTINUED] traZODone (DESYREL) 50 MG tablet Take 1 tablet (50 mg total) by mouth at bedtime.   No facility-administered encounter medications on file as of 06/12/2023.    Allergies (verified) Patient has no known allergies.   History: Past Medical History:  Diagnosis Date   Arthritis    Cerebral palsy (HCC)    Cervical radiculopathy    H/O umbilical hernia repair 2001   Pre-diabetes    Scoliosis    Spondylosis of cervical spine    Past Surgical History:  Procedure Laterality Date   ANTERIOR CERVICAL DECOMPRESSION/DISCECTOMY FUSION 4 LEVELS N/A 08/11/2017   Procedure: Cervical three to Cervical seven Anterior Cervical discectomy with fusion/plate fixation;  Surgeon: Ditty, Loura Halt, MD;  Location: [facility] OR;  Service: Neurosurgery;  Laterality: N/A;   BACK SURGERY     HERNIA REPAIR     inguinal hernia repair in early 2000   LEG SURGERY     POSTERIOR CERVICAL FUSION/FORAMINOTOMY N/A 08/12/2018   Procedure: POSTERIOR CERVICAL FUSION WITH LATERAL MASS FIXATION CERVICAL THREE TO CERVICAL SEVEN;  Surgeon: Lisbeth Renshaw, MD;  Location:  [facility] OR;  Service: Neurosurgery;  Laterality: N/A;   SHOULDER ARTHROSCOPY WITH ROTATOR CUFF REPAIR AND SUBACROMIAL DECOMPRESSION Right 02/02/2018   Procedure: RIGHT SHOULDER ARTHROSCOPY WITH SUBACROMIAL DECOMPRESSION, MINI-OPEN ROTATOR CUFF REPAIR;  Surgeon: Cammy Copa, MD;  Location: [facility] OR;  Service: Orthopedics;  Laterality: Right;   SHOULDER SURGERY Right    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   Procedure: LEFT TOTAL HIP ARTHROPLASTY ANTERIOR APPROACH;  Surgeon: Tarry Kos, MD;  Location: [facility] OR;  Service: Orthopedics;   Laterality: Left;   Family History  Problem Relation Age of Onset   Cancer Father        esophagus   Diabetes Brother    Diabetes Paternal Uncle    Social History   Socioeconomic History   Marital status: Married    Spouse name: Not on file   Number of children: Not on file   Years of education: Not on file   Highest education level: Some college, no degree  Occupational History   Not on file  Tobacco Use   Smoking status: Every Day    Current packs/day: 1.00    Average packs/day: 1 pack/day for 35.0 years (35.0 ttl pk-yrs)    Types: Cigarettes   Smokeless tobacco: Never  Vaping Use   Vaping status: Never Used  Substance and Sexual Activity   Alcohol use: Never   Drug use: Never   Sexual activity: Yes  Other Topics Concern   Not on file  Social History Narrative   ** Merged History Encounter **       Social Determinants of Health   Financial Resource Strain: Low Risk  (06/12/2023)   Overall Financial Resource Strain (CARDIA)    Difficulty of Paying Living Expenses: Not hard at all  Recent Concern: Financial Resource Strain - Medium Risk (05/28/2023)   Overall Financial Resource Strain (CARDIA)    Difficulty of Paying Living Expenses: Somewhat hard  Food Insecurity: No Food Insecurity (06/12/2023)   Hunger Vital Sign    Worried About Running Out of Food in the Last Year: Never true    Ran Out of Food in the Last Year: Never true  Recent Concern: Food Insecurity - Food Insecurity Present (05/28/2023)   Hunger Vital Sign    Worried About Running Out of Food in the Last Year: Sometimes true    Ran Out of Food in the Last Year: Sometimes true  Transportation Needs: No Transportation Needs (06/12/2023)   PRAPARE - Administrator, Civil Service (Medical): No    Lack of Transportation (Non-Medical): No  Recent Concern: Transportation Needs - Unmet Transportation Needs (05/28/2023)   PRAPARE - Transportation    Lack of Transportation (Medical): No    Lack of  Transportation (Non-Medical): Yes  Physical Activity: Insufficiently Active (06/12/2023)   Exercise Vital Sign    Days of Exercise per Week: 3 days    Minutes of Exercise per Session: 40 min  Stress: No Stress Concern Present (06/12/2023)   [employer] of Occupational Health - Occupational Stress Questionnaire    Feeling of Stress : Not at all  Recent Concern: Stress - Stress Concern Present (05/28/2023)   [employer] of Occupational Health - Occupational Stress Questionnaire    Feeling of Stress : Very much  Social Connections: Socially Integrated (06/12/2023)   Social Connection and Isolation Panel [NHANES]    Frequency of Communication with Friends and Family: More than three times a week    Frequency of Social Gatherings with Friends and Family:  More than three times a week    Attends Religious Services: More than 4 times per year    Active Member of Clubs or Organizations: Yes    Attends Banker Meetings: More than 4 times per year    Marital Status: Married  Recent Concern: Social Connections - Moderately Isolated (05/28/2023)   Social Connection and Isolation Panel [NHANES]    Frequency of Communication with Friends and Family: Once a week    Frequency of Social Gatherings with Friends and Family: Once a week    Attends Religious Services: 1 to 4 times per year    Active Member of [employer] or Organizations: No    Attends Engineer, structural: Not on file    Marital Status: Married    Tobacco Counseling Ready to quit: Yes Counseling given: Yes   Clinical Intake:  Pre-visit preparation completed: Yes  Pain : No/denies pain     BMI - recorded: 18.92 Nutritional Status: BMI <19  Underweight Nutritional Risks: None Diabetes: No  How often do you need to have someone help you when you read instructions, pamphlets, or other written materials from your doctor or pharmacy?: 1 - Never  Interpreter Needed?: No  Information entered by :: Theresa Mulligan LPN   Activities of Daily Living    06/12/2023   10:29 AM  In your present state of health, do you have any difficulty performing the following activities:  Hearing? 0  Vision? 0  Difficulty concentrating or making decisions? 0  Walking or climbing stairs? 1  Comment Uses a cane  Dressing or bathing? 0  Doing errands, shopping? 0  Preparing Food and eating ? N  Using the Toilet? N  In the past six months, have you accidently leaked urine? N  Do you have problems with loss of bowel control? N  Managing your Medications? N  Managing your Finances? N  Housekeeping or managing your Housekeeping? N    Patient Care Team: [place], Betty G, MD as PCP - General (Family Medicine) [place], Betty G, MD (Family Medicine) Astrid Drafts Zadie Cleverly, RN as Case Manager Tarry Kos, MD as Attending Physician (Orthopedic Surgery)  Indicate any recent Medical Services you may have received from other than [facility] providers in the past year (date may be approximate).     Assessment:   This is a routine wellness examination for Jibreel.  Hearing/Vision screen Hearing Screening - Comments:: Denies hearing difficulties   Vision Screening - Comments:: Wears rx glasses - up to date with routine eye exams with  [facility]   Goals Addressed               This Visit's Progress     Patient Stated (pt-stated)        Go back to school.       Depression Screen    06/12/2023   10:28 AM 05/29/2023    9:46 AM 05/29/2023    9:45 AM 05/26/2023   11:14 AM 04/27/2023    9:35 AM 03/04/2023   10:50 AM 04/21/2022    2:13 PM  PHQ 2/9 Scores  PHQ - 2 Score 0 4 4 0 3 3 3   PHQ- 9 Score 0 13 13  13 10 9     Fall Risk    06/12/2023   10:30 AM 05/29/2023    9:46 AM 05/29/2023    9:44 AM 05/26/2023   11:14 AM 03/04/2023   10:50 AM  Fall Risk   Falls in the  past year? 1 1 0 0 0  Number falls in past yr: 0 1 0 0 0  Injury with Fall? 0 0 0 0 0  Risk for fall due to : No Fall Risks History of fall(s) No  Fall Risks  Impaired balance/gait;Impaired mobility  Follow up Falls prevention discussed    Falls evaluation completed    MEDICARE RISK AT HOME: Medicare Risk at Home Any stairs in or around the home?: No If so, are there any without handrails?: No Home free of loose throw rugs in walkways, pet beds, electrical cords, etc?: Yes Adequate lighting in your home to reduce risk of falls?: Yes Life alert?: No Use of a cane, walker or w/c?: Yes Grab bars in the bathroom?: No Shower chair or bench in shower?: No Elevated toilet seat or a handicapped toilet?: No  TIMED UP AND GO:  Was the test performed?  No    Cognitive Function:        06/12/2023   10:31 AM 04/16/2022    2:33 PM 04/09/2021    3:31 PM  6CIT Screen  What Year? 0 points 0 points 0 points  What month? 0 points 0 points 0 points  What time? 0 points 0 points 0 points  Count back from 20 0 points 0 points 0 points  Months in reverse 0 points 0 points 0 points  Repeat phrase 0 points 0 points 0 points  Total Score 0 points 0 points 0 points    Immunizations Immunization History  Administered Date(s) Administered   Influenza,inj,Quad PF,6+ Mos 06/01/2017   PFIZER Comirnaty(Gray Top)Covid-19 Tri-Sucrose Vaccine 12/30/2019, 01/21/2020, 07/25/2020   Tdap 08/14/2020    TDAP status: Up to date  Flu Vaccine status: Due, Education has been provided regarding the importance of this vaccine. Advised may receive this vaccine at local pharmacy or Health Dept. Aware to provide a copy of the vaccination record if obtained from local pharmacy or Health Dept. Verbalized acceptance and understanding.    Covid-19 vaccine status: Declined, Education has been provided regarding the importance of this vaccine but patient still declined. Advised may receive this vaccine at local pharmacy or Health Dept.or vaccine clinic. Aware to provide a copy of the vaccination record if obtained from local pharmacy or Health Dept. Verbalized  acceptance and understanding.    Screening Tests Health Maintenance  Topic Date Due   Lung Cancer Screening  08/13/2021   COVID-19 Vaccine (4 - 2023-24 season) 06/25/2023 (Originally 06/07/2023)   INFLUENZA VACCINE  01/04/2024 (Originally 05/07/2023)   Medicare Annual Wellness (AWV)  06/11/2024   Colonoscopy  08/13/2030   DTaP/Tdap/Td (2 - Td or Tdap) 08/14/2030   Hepatitis C Screening  Completed   HIV Screening  Completed   HPV VACCINES  Aged Out   Zoster Vaccines- Shingrix  Discontinued    Health Maintenance  Health Maintenance Due  Topic Date Due   Lung Cancer Screening  08/13/2021    Colorectal cancer screening: Type of screening: Colonoscopy. Completed 08/13/20. Repeat every 10 years  Lung Cancer Screening: (Low Dose CT Chest recommended if Age 65-80 years, 20 pack-year currently smoking OR have quit w/in 15years.) does qualify.   Lung Cancer Screening Referral: Completed 13/13/23  Additional Screening:  Hepatitis C Screening: does qualify; Completed 04/30/22  Vision Screening: Recommended annual ophthalmology exams for early detection of glaucoma and other disorders of the eye. Is the patient up to date with their annual eye exam?  Yes  Who is the provider or what is the  name of the office in which the patient attends annual eye exams? [facility] If pt is not established with a provider, would they like to be referred to a provider to establish care? No .   Dental Screening: Recommended annual dental exams for proper oral hygiene    Community Resource Referral / Chronic Care Management:  CRR required this visit?  No   CCM required this visit?  No     Plan:     I have personally reviewed and noted the following in the patient's chart:   Medical and social history Use of alcohol, tobacco or illicit drugs  Current medications and supplements including opioid prescriptions. Patient is currently taking opioid prescriptions. Information provided to patient  regarding non-opioid alternatives. Patient advised to discuss non-opioid treatment plan with their provider. Functional ability and status Nutritional status Physical activity Advanced directives List of other physicians Hospitalizations, surgeries, and ER visits in previous 12 months Vitals Screenings to include cognitive, depression, and falls Referrals and appointments  In addition, I have reviewed and discussed with patient certain preventive protocols, quality metrics, and best practice recommendations. A written personalized care plan for preventive services as well as general preventive health recommendations were provided to patient.     Tillie Rung, LPN   06/08/2354   After Visit Summary: (MyChart) Due to this being a telephonic visit, the after visit summary with patients personalized plan was offered to patient via MyChart   Nurse Notes: None

## 2023-06-12 NOTE — Patient Instructions (Addendum)
Philip Bates , Thank you for taking time to come for your Medicare Wellness Visit. I appreciate your ongoing commitment to your health goals. Please review the following plan we discussed and let me know if I can assist you in the future.  Opioid Pain Medicine Management Opioids are powerful medicines that are used to treat moderate to severe pain. When used for short periods of time, they can help you to: Sleep better. Do better in physical or occupational therapy. Feel better in the first few days after an injury. Recover from surgery. Opioids should be taken with the supervision of a trained health care provider. They should be taken for the shortest period of time possible. This is because opioids can be addictive, and the longer you take opioids, the greater your risk of addiction. This addiction can also be called opioid use disorder. What are the risks? Using opioid pain medicines for longer than 3 days increases your risk of side effects. Side effects include: Constipation. Nausea and vomiting. Breathing difficulties (respiratory depression). Drowsiness. Confusion. Opioid use disorder. Itching. Taking opioid pain medicine for a long period of time can affect your ability to do daily tasks. It also puts you at risk for: Motor vehicle crashes. Depression. Suicide. Heart attack. Overdose, which can be life-threatening. What is a pain treatment plan? A pain treatment plan is an agreement between you and your health care provider. Pain is unique to each person, and treatments vary depending on your condition. To manage your pain, you and your health care provider need to work together. To help you do this: Discuss the goals of your treatment, including how much pain you might expect to have and how you will manage the pain. Review the risks and benefits of taking opioid medicines. Remember that a good treatment plan uses more than one approach and minimizes the chance of side effects. Be  honest about the amount of medicines you take and about any drug or alcohol use. Get pain medicine prescriptions from only one health care provider. Pain can be managed with many types of alternative treatments. Ask your health care provider to refer you to one or more specialists who can help you manage pain through: Physical or occupational therapy. Counseling (cognitive behavioral therapy). Good nutrition. Biofeedback. Massage. Meditation. Non-opioid medicine. Following a gentle exercise program. How to use opioid pain medicine Taking medicine Take your pain medicine exactly as told by your health care provider. Take it only when you need it. If your pain gets less severe, you may take less than your prescribed dose if your health care provider approves. If you are not having pain, do nottake pain medicine unless your health care provider tells you to take it. If your pain is severe, do nottry to treat it yourself by taking more pills than instructed on your prescription. Contact your health care provider for help. Write down the times when you take your pain medicine. It is easy to become confused while on pain medicine. Writing the time can help you avoid overdose. Take other over-the-counter or prescription medicines only as told by your health care provider. Keeping yourself and others safe  While you are taking opioid pain medicine: Do not drive, use machinery, or power tools. Do not sign legal documents. Do not drink alcohol. Do not take sleeping pills. Do not supervise children by yourself. Do not do activities that require climbing or being in high places. Do not go to a lake, river, ocean, spa, or swimming pool. Do  not share your pain medicine with anyone. Keep pain medicine in a locked cabinet or in a secure area where pets and children cannot reach it. Stopping your use of opioids If you have been taking opioid medicine for more than a few weeks, you may need to slowly  decrease (taper) how much you take until you stop completely. Tapering your use of opioids can decrease your risk of symptoms of withdrawal, such as: Pain and cramping in the abdomen. Nausea. Sweating. Sleepiness. Restlessness. Uncontrollable shaking (tremors). Cravings for the medicine. Do not attempt to taper your use of opioids on your own. Talk with your health care provider about how to do this. Your health care provider may prescribe a step-down schedule based on how much medicine you are taking and how long you have been taking it. Getting rid of leftover pills Do not save any leftover pills. Get rid of leftover pills safely by: Taking the medicine to a prescription take-back program. This is usually offered by the county or law enforcement. Bringing them to a pharmacy that has a drug disposal container. Flushing them down the toilet. Check the label or package insert of your medicine to see whether this is safe to do. Throwing them out in the trash. Check the label or package insert of your medicine to see whether this is safe to do. If it is safe to throw it out, remove the medicine from the original container, put it into a sealable bag or container, and mix it with used coffee grounds, food scraps, dirt, or cat litter before putting it in the trash. Follow these instructions at home: Activity Do exercises as told by your health care provider. Avoid activities that make your pain worse. Return to your normal activities as told by your health care provider. Ask your health care provider what activities are safe for you. General instructions You may need to take these actions to prevent or treat constipation: Drink enough fluid to keep your urine pale yellow. Take over-the-counter or prescription medicines. Eat foods that are high in fiber, such as beans, whole grains, and fresh fruits and vegetables. Limit foods that are high in fat and processed sugars, such as fried or sweet  foods. Keep all follow-up visits. This is important. Where to find support If you have been taking opioids for a long time, you may benefit from receiving support for quitting from a local support group or counselor. Ask your health care provider for a referral to these resources in your area. Where to find more information Centers for Disease Control and Prevention (CDC): FootballExhibition.com.br [place] Food and Drug Administration (FDA): PumpkinSearch.com.ee Get help right away if: You may have taken too much of an opioid (overdosed). Common symptoms of an overdose: Your breathing is slower or more shallow than normal. You have a very slow heartbeat (pulse). You have slurred speech. You have nausea and vomiting. Your pupils become very small. You have other potential symptoms: You are very confused. You faint or feel like you will faint. You have cold, clammy skin. You have blue lips or fingernails. You have thoughts of harming yourself or harming others. These symptoms may represent a serious problem that is an emergency. Do not wait to see if the symptoms will go away. Get medical help right away. Call your local emergency services (911 in the [place] Do not drive yourself to the hospital.  If you ever feel like you may hurt yourself or others, or have thoughts about taking your own  life, get help right away. Go to your nearest emergency department or: Call your local emergency services (911 in the [place] Call the [facility] ((914)781-6371 in the [place] Call a suicide crisis helpline, such as the National Suicide Prevention Lifeline at (606)340-5542 or 988 in the [place] This is open 24 hours a day in the [place] Text the Crisis Text Line at 989-581-3008 (in the [place] Summary Opioid medicines can help you manage moderate to severe pain for a short period of time. A pain treatment plan is an agreement between you and your health care provider. Discuss the goals of your treatment, including how much  pain you might expect to have and how you will manage the pain. If you think that you or someone else may have taken too much of an opioid, get medical help right away. This information is not intended to replace advice given to you by your health care provider. Make sure you discuss any questions you have with your health care provider. Document Revised: 04/17/2021 Document Reviewed: 01/02/2021 Elsevier Patient Education  2024 Elsevier Inc.  Referrals/Orders/Follow-Ups/Clinician Recommendations:    This is a list of the screening recommended for you and due dates:  Health Maintenance  Topic Date Due   Screening for Lung Cancer  08/13/2021   COVID-19 Vaccine (4 - 2023-24 season) 06/25/2023*   Flu Shot  01/04/2024*   Medicare Annual Wellness Visit  06/11/2024   Colon Cancer Screening  08/13/2030   DTaP/Tdap/Td vaccine (2 - Td or Tdap) 08/14/2030   Hepatitis C Screening  Completed   HIV Screening  Completed   HPV Vaccine  Aged Out   Zoster (Shingles) Vaccine  Discontinued  *Topic was postponed. The date shown is not the original due date.    Advanced directives: (Declined) Advance directive discussed with you today. Even though you declined this today, please call our office should you change your mind, and we can give you the proper paperwork for you to fill out.  Next Medicare Annual Wellness Visit scheduled for next year: Yes

## 2023-06-19 ENCOUNTER — Encounter (HOSPITAL_BASED_OUTPATIENT_CLINIC_OR_DEPARTMENT_OTHER): Payer: 59

## 2023-06-22 ENCOUNTER — Encounter (HOSPITAL_BASED_OUTPATIENT_CLINIC_OR_DEPARTMENT_OTHER): Payer: Self-pay

## 2023-06-23 ENCOUNTER — Encounter: Payer: 59 | Attending: Physical Medicine and Rehabilitation | Admitting: Physical Medicine and Rehabilitation

## 2023-06-23 ENCOUNTER — Other Ambulatory Visit: Payer: Self-pay | Admitting: Family Medicine

## 2023-06-23 DIAGNOSIS — I739 Peripheral vascular disease, unspecified: Secondary | ICD-10-CM

## 2023-06-23 DIAGNOSIS — G4701 Insomnia due to medical condition: Secondary | ICD-10-CM | POA: Insufficient documentation

## 2023-06-23 DIAGNOSIS — I6529 Occlusion and stenosis of unspecified carotid artery: Secondary | ICD-10-CM

## 2023-06-23 DIAGNOSIS — R030 Elevated blood-pressure reading, without diagnosis of hypertension: Secondary | ICD-10-CM

## 2023-06-23 DIAGNOSIS — G8929 Other chronic pain: Secondary | ICD-10-CM | POA: Insufficient documentation

## 2023-06-23 DIAGNOSIS — N401 Enlarged prostate with lower urinary tract symptoms: Secondary | ICD-10-CM | POA: Insufficient documentation

## 2023-06-23 DIAGNOSIS — R350 Frequency of micturition: Secondary | ICD-10-CM | POA: Insufficient documentation

## 2023-06-23 DIAGNOSIS — G894 Chronic pain syndrome: Secondary | ICD-10-CM

## 2023-06-23 DIAGNOSIS — M25552 Pain in left hip: Secondary | ICD-10-CM | POA: Insufficient documentation

## 2023-06-23 MED ORDER — GABAPENTIN 400 MG PO CAPS: 400.0000 mg | ORAL_CAPSULE | Freq: Three times a day (TID) | ORAL | 3 refills | Status: DC

## 2023-06-23 NOTE — Progress Notes (Signed)
Subjective:    Patient ID: Philip Bates, male    DOB: September 09, 1961, 62 y.o.   MRN: [id number]  HPI: Philip Bates is a 62 y.o. male who returns for follow up appointment for chronic pain and medication refill. He states his pain is located in his neck radiating into her bilateral shoulders, lower back pain, left hip and left knee pain. He rates his pain 8. His current exercise regime is walking.   Mr. Freudenthal Morphine equivalent is 60.00 MME.   Last UDS was Performed on 04/27/2023, it was consistent  Mr. Fenters past surgical history:  On 02/02/2018: Dr August Saucer   RIGHT SHOULDER ARTHROSCOPY WITH SUBACROMIAL DECOMPRESSION, MINI-OPEN ROTATOR CUFF REPAIR  On 08/12/2028: Dr Conchita Paris POSTERIOR CERVICAL FUSION WITH LATERAL MASS FIXATION CERVICAL THREE TO CERVICAL SEVEN  On 09/10/2020: Dr Roda Shutters LEFT TOTAL HIP ARTHROPLASTY ANTERIOR APPROACH   1) Insomnia He does not like the way the trazodone makes him feel  Pain Inventory Average Pain 8 Pain Right Now 8 My pain is constant, burning, and aching  In the last 24 hours, has pain interfered with the following? General activity 5 Relation with others 5 Enjoyment of life 6 What TIME of day is your pain at its worst? morning , daytime, evening, and night Sleep (in general) Poor  Pain is worse with: walking, sitting, standing, and some activites Pain improves with: rest and medication Relief from Meds: 9  Family History  Problem Relation Age of Onset   Cancer Father        esophagus   Diabetes Brother    Diabetes Paternal Uncle    Social History   Socioeconomic History   Marital status: Married    Spouse name: Not on file   Number of children: Not on file   Years of education: Not on file   Highest education level: Some college, no degree  Occupational History   Not on file  Tobacco Use   Smoking status: Every Day    Current packs/day: 1.00    Average packs/day: 1 pack/day for 35.0 years (35.0 ttl pk-yrs)    Types: Cigarettes   Smokeless  tobacco: Never  Vaping Use   Vaping status: Never Used  Substance and Sexual Activity   Alcohol use: Never   Drug use: Never   Sexual activity: Yes  Other Topics Concern   Not on file  Social History Narrative   ** Merged History Encounter **       Social Determinants of Health   Financial Resource Strain: Low Risk  (06/12/2023)   Overall Financial Resource Strain (CARDIA)    Difficulty of Paying Living Expenses: Not hard at all  Recent Concern: Financial Resource Strain - Medium Risk (05/28/2023)   Overall Financial Resource Strain (CARDIA)    Difficulty of Paying Living Expenses: Somewhat hard  Food Insecurity: No Food Insecurity (06/12/2023)   Hunger Vital Sign    Worried About Running Out of Food in the Last Year: Never true    Ran Out of Food in the Last Year: Never true  Recent Concern: Food Insecurity - Food Insecurity Present (05/28/2023)   Hunger Vital Sign    Worried About Running Out of Food in the Last Year: Sometimes true    Ran Out of Food in the Last Year: Sometimes true  Transportation Needs: No Transportation Needs (06/12/2023)   PRAPARE - Administrator, Civil Service (Medical): No    Lack of Transportation (Non-Medical): No  Recent Concern: Transportation Needs - Unmet  Transportation Needs (05/28/2023)   PRAPARE - Transportation    Lack of Transportation (Medical): No    Lack of Transportation (Non-Medical): Yes  Physical Activity: Insufficiently Active (06/12/2023)   Exercise Vital Sign    Days of Exercise per Week: 3 days    Minutes of Exercise per Session: 40 min  Stress: No Stress Concern Present (06/12/2023)   [employer] of Occupational Health - Occupational Stress Questionnaire    Feeling of Stress : Not at all  Recent Concern: Stress - Stress Concern Present (05/28/2023)   [employer] of Occupational Health - Occupational Stress Questionnaire    Feeling of Stress : Very much  Social Connections: Socially Integrated (06/12/2023)    Social Connection and Isolation Panel [NHANES]    Frequency of Communication with Friends and Family: More than three times a week    Frequency of Social Gatherings with Friends and Family: More than three times a week    Attends Religious Services: More than 4 times per year    Active Member of [employer] or Organizations: Yes    Attends Engineer, structural: More than 4 times per year    Marital Status: Married  Recent Concern: Social Connections - Moderately Isolated (05/28/2023)   Social Connection and Isolation Panel [NHANES]    Frequency of Communication with Friends and Family: Once a week    Frequency of Social Gatherings with Friends and Family: Once a week    Attends Religious Services: 1 to 4 times per year    Active Member of [employer] or Organizations: No    Attends Engineer, structural: Not on file    Marital Status: Married   Past Surgical History:  Procedure Laterality Date   ANTERIOR CERVICAL DECOMPRESSION/DISCECTOMY FUSION 4 LEVELS N/A 08/11/2017   Procedure: Cervical three to Cervical seven Anterior Cervical discectomy with fusion/plate fixation;  Surgeon: Ditty, Loura Halt, MD;  Location[facility] OR;  Service: Neurosurgery;  Laterality: N/A;   BACK SURGERY     HERNIA REPAIR     inguinal hernia repair in early 2000   LEG SURGERY     POSTERIOR CERVICAL FUSION/FORAMINOTOMY N/A 08/12/2018   Procedure: POSTERIOR CERVICAL FUSION WITH LATERAL MASS FIXATION CERVICAL THREE TO CERVICAL SEVEN;  Surgeon: Lisbeth Renshaw, MD;  Location: [facility] OR;  Service: Neurosurgery;  Laterality: N/A;   SHOULDER ARTHROSCOPY WITH ROTATOR CUFF REPAIR AND SUBACROMIAL DECOMPRESSION Right 02/02/2018   Procedure: RIGHT SHOULDER ARTHROSCOPY WITH SUBACROMIAL DECOMPRESSION, MINI-OPEN ROTATOR CUFF REPAIR;  Surgeon: Cammy Copa, MD;  Location: [facility] OR;  Service: Orthopedics;  Laterality: Right;   SHOULDER SURGERY Right    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   Procedure: LEFT TOTAL HIP  ARTHROPLASTY ANTERIOR APPROACH;  Surgeon: Tarry Kos, MD;  Location: [facility] OR;  Service: Orthopedics;  Laterality: Left;   Past Surgical History:  Procedure Laterality Date   ANTERIOR CERVICAL DECOMPRESSION/DISCECTOMY FUSION 4 LEVELS N/A 08/11/2017   Procedure: Cervical three to Cervical seven Anterior Cervical discectomy with fusion/plate fixation;  Surgeon: Ditty, Loura Halt, MD;  Location: [facility] OR;  Service: Neurosurgery;  Laterality: N/A;   BACK SURGERY     HERNIA REPAIR     inguinal hernia repair in early 2000   LEG SURGERY     POSTERIOR CERVICAL FUSION/FORAMINOTOMY N/A 08/12/2018   Procedure: POSTERIOR CERVICAL FUSION WITH LATERAL MASS FIXATION CERVICAL THREE TO CERVICAL SEVEN;  Surgeon: Lisbeth Renshaw, MD;  Location: [facility] OR;  Service: Neurosurgery;  Laterality: N/A;   SHOULDER ARTHROSCOPY WITH ROTATOR CUFF REPAIR AND SUBACROMIAL  DECOMPRESSION Right 02/02/2018   Procedure: RIGHT SHOULDER ARTHROSCOPY WITH SUBACROMIAL DECOMPRESSION, MINI-OPEN ROTATOR CUFF REPAIR;  Surgeon: Cammy Copa, MD;  Location: [facility] OR;  Service: Orthopedics;  Laterality: Right;   SHOULDER SURGERY Right    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   Procedure: LEFT TOTAL HIP ARTHROPLASTY ANTERIOR APPROACH;  Surgeon: Tarry Kos, MD;  Location: [facility] OR;  Service: Orthopedics;  Laterality: Left;   Past Medical History:  Diagnosis Date   Arthritis    Cerebral palsy (HCC)    Cervical radiculopathy    H/O umbilical hernia repair 2001   Pre-diabetes    Scoliosis    Spondylosis of cervical spine    There were no vitals taken for this visit.  Opioid Risk Score:   Fall Risk Score:  `1  Depression screen [facility] 2/9     06/12/2023   10:28 AM 05/29/2023    9:46 AM 05/29/2023    9:45 AM 05/26/2023   11:14 AM 04/27/2023    9:35 AM 03/04/2023   10:50 AM 04/21/2022    2:13 PM  Depression screen PHQ 2/9  Decreased Interest 0 3 3 0 2 2 2   Down, Depressed, Hopeless 0 1 1 0 1 1 1   PHQ - 2 Score 0 4 4 0 3 3 3   Altered  sleeping 0 3 3  3 3 2   Tired, decreased energy 0 1 1  2 2 2   Change in appetite 0 2 2  3 1 2   Feeling bad or failure about yourself  0 1 1  1  0 0  Trouble concentrating 0 1 1  1 1  0  Moving slowly or fidgety/restless 0 1 1  0 0 0  Suicidal thoughts 0 0 0  0 0 0  PHQ-9 Score 0 13 13  13 10 9   Difficult doing work/chores Not difficult at all Extremely dIfficult Extremely dIfficult   Somewhat difficult Somewhat difficult    Review of Systems  Musculoskeletal:  Positive for back pain.       B/L shoulder LT leg LT hip pain  All other systems reviewed and are negative.      Objective:       Assessment & Plan:  Cervicalgia/ Cervical Radiculitis: Continue HEP as Tolerated. Continue to Monitor. Continue Gabapentin. Continue to Monitor.  Chronic Right Shoulder Pain:  Continue HEP as Tolerated. Continue to Monitor. Ortho Following.  Failed Back Syndrome: Continue HEP as Tolerated. Continue to Monitor.  Chronic Bilateral Low Back Pain without Sciatica: Continue HEP as Tolerated. Continue current medication regimen. Continue to Monitor.  Chronic Left Hip Pain: S/P On 09/10/2020: Dr Roda Shutters LEFT TOTAL HIP ARTHROPLASTY ANTERIOR APPROACH Continue HEP as Tolerated. Continue current medication regimen. Continue to Monitor.  Chronic Left knee Pain: Continue HEP as Tolerated. Continue to Monitor.  Left Foot Drop: Wearing AFO: [employer] Following. Continue to Monitor.  Chronic Pain Syndrome: Oxycodone 10/325 mg one tablet 4 times a day as needed for pain #120. We will continue the opioid monitoring program, this consists of regular clinic visits, examinations, urine drug screen, pill counts as well as use of [place] Controlled Substance Reporting system. A 12 month History has been reviewed on the [place] Controlled Substance Reporting System on 05/26/2023 Insomnia: increase gabapentin to 400mg  HS.  Recommended tart cherry juice, valerian and chamomile teas Recommended that he minimize light and  screens at night Recommended that he shoulder get sunlight exposure during the day  5 minutes spent in discussion of his insomnia, medications  that he has failed, recommended increasing gabapentin to 400mg  HS, discussed using tart cherry juice at night

## 2023-06-24 ENCOUNTER — Telehealth: Payer: Self-pay | Admitting: Physician Assistant

## 2023-06-26 ENCOUNTER — Encounter: Payer: 59 | Admitting: Registered Nurse

## 2023-07-01 ENCOUNTER — Ambulatory Visit (HOSPITAL_COMMUNITY)
Admission: RE | Admit: 2023-07-01 | Discharge: 2023-07-01 | Disposition: A | Discharge status: Home / Self Care | Payer: 59 | Source: Ambulatory Visit | Attending: Cardiology | Admitting: Cardiology

## 2023-07-01 DIAGNOSIS — I739 Peripheral vascular disease, unspecified: Secondary | ICD-10-CM | POA: Diagnosis not present

## 2023-07-01 DIAGNOSIS — I6529 Occlusion and stenosis of unspecified carotid artery: Secondary | ICD-10-CM | POA: Insufficient documentation

## 2023-07-03 ENCOUNTER — Ambulatory Visit: Payer: 59 | Admitting: Oncology

## 2023-07-03 ENCOUNTER — Other Ambulatory Visit: Payer: 59

## 2023-07-06 ENCOUNTER — Encounter: Payer: Self-pay | Admitting: Physical Medicine and Rehabilitation

## 2023-07-06 ENCOUNTER — Encounter (HOSPITAL_BASED_OUTPATIENT_CLINIC_OR_DEPARTMENT_OTHER): Payer: 59 | Admitting: Physical Medicine and Rehabilitation

## 2023-07-06 VITALS — BP 131/85 | HR 69 | Ht 64.8 in | Wt 113.0 lb

## 2023-07-06 DIAGNOSIS — N401 Enlarged prostate with lower urinary tract symptoms: Secondary | ICD-10-CM

## 2023-07-06 DIAGNOSIS — R350 Frequency of micturition: Secondary | ICD-10-CM | POA: Diagnosis not present

## 2023-07-06 DIAGNOSIS — G8929 Other chronic pain: Secondary | ICD-10-CM

## 2023-07-06 DIAGNOSIS — G4701 Insomnia due to medical condition: Secondary | ICD-10-CM

## 2023-07-06 DIAGNOSIS — M25552 Pain in left hip: Secondary | ICD-10-CM | POA: Diagnosis not present

## 2023-07-06 MED ORDER — TAMSULOSIN HCL 0.4 MG PO CAPS: 0.4000 mg | ORAL_CAPSULE | Freq: Every day | ORAL | 3 refills | Status: DC

## 2023-07-06 MED ORDER — OXYCODONE-ACETAMINOPHEN 10-325 MG PO TABS: 1.0000 | ORAL_TABLET | ORAL | 0 refills | Status: DC | PRN

## 2023-07-06 NOTE — Progress Notes (Signed)
Subjective:    Patient ID: Philip Bates, male    DOB: Feb 19, 1961, 62 y.o.   MRN: [id number]  HPI: Philip Bates is a 62 y.o. male who returns for follow up appointment for chronic pain and medication refill. He states his pain is located in his neck radiating into her bilateral shoulders, lower back pain, left hip and left knee pain. He rates his pain 8. His current exercise regime is walking.   Philip Bates Morphine equivalent is 60.00 MME.   Last UDS was Performed on 04/27/2023, it was consistent  Philip Bates past surgical history:  On 02/02/2018: Dr August Saucer   RIGHT SHOULDER ARTHROSCOPY WITH SUBACROMIAL DECOMPRESSION, MINI-OPEN ROTATOR CUFF REPAIR  On 08/12/2028: Dr Conchita Paris POSTERIOR CERVICAL FUSION WITH LATERAL MASS FIXATION CERVICAL THREE TO CERVICAL SEVEN  On 09/10/2020: Dr Roda Shutters LEFT TOTAL HIP ARTHROPLASTY ANTERIOR APPROACH   1) Insomnia He does not like the way the trazodone makes him feel -tart cherry juice he is drinking   2) Hip pain: -sometimes he needs more medication than prescribed -painting worsens pain  Pain Inventory Average Pain 8 Pain Right Now 8 My pain is constant, sharp, burning, dull, stabbing, tingling, and aching  In the last 24 hours, has pain interfered with the following? General activity 8 Relation with others 7 Enjoyment of life 7 What TIME of day is your pain at its worst? morning  Sleep (in general) Poor  Pain is worse with: walking, standing, and some activites Pain improves with: rest and medication Relief from Meds: 7  Family History  Problem Relation Age of Onset   Cancer Father        esophagus   Diabetes Brother    Diabetes Paternal Uncle    Social History   Socioeconomic History   Marital status: Married    Spouse name: Not on file   Number of children: Not on file   Years of education: Not on file   Highest education level: Some college, no degree  Occupational History   Not on file  Tobacco Use   Smoking status: Every Day     Current packs/day: 1.00    Average packs/day: 1 pack/day for 35.0 years (35.0 ttl pk-yrs)    Types: Cigarettes   Smokeless tobacco: Never  Vaping Use   Vaping status: Never Used  Substance and Sexual Activity   Alcohol use: Never   Drug use: Never   Sexual activity: Yes  Other Topics Concern   Not on file  Social History Narrative   ** Merged History Encounter **       Social Determinants of Health   Financial Resource Strain: Low Risk  (06/12/2023)   Overall Financial Resource Strain (CARDIA)    Difficulty of Paying Living Expenses: Not hard at all  Recent Concern: Financial Resource Strain - Medium Risk (05/28/2023)   Overall Financial Resource Strain (CARDIA)    Difficulty of Paying Living Expenses: Somewhat hard  Food Insecurity: No Food Insecurity (06/12/2023)   Hunger Vital Sign    Worried About Running Out of Food in the Last Year: Never true    Ran Out of Food in the Last Year: Never true  Recent Concern: Food Insecurity - Food Insecurity Present (05/28/2023)   Hunger Vital Sign    Worried About Running Out of Food in the Last Year: Sometimes true    Ran Out of Food in the Last Year: Sometimes true  Transportation Needs: No Transportation Needs (06/12/2023)   PRAPARE - Transportation  Lack of Transportation (Medical): No    Lack of Transportation (Non-Medical): No  Recent Concern: Transportation Needs - Unmet Transportation Needs (05/28/2023)   PRAPARE - Transportation    Lack of Transportation (Medical): No    Lack of Transportation (Non-Medical): Yes  Physical Activity: Insufficiently Active (06/12/2023)   Exercise Vital Sign    Days of Exercise per Week: 3 days    Minutes of Exercise per Session: 40 min  Stress: No Stress Concern Present (06/12/2023)   [employer] of Occupational Health - Occupational Stress Questionnaire    Feeling of Stress : Not at all  Recent Concern: Stress - Stress Concern Present (05/28/2023)   [employer] of Occupational Health -  Occupational Stress Questionnaire    Feeling of Stress : Very much  Social Connections: Socially Integrated (06/12/2023)   Social Connection and Isolation Panel [NHANES]    Frequency of Communication with Friends and Family: More than three times a week    Frequency of Social Gatherings with Friends and Family: More than three times a week    Attends Religious Services: More than 4 times per year    Active Member of [employer] or Organizations: Yes    Attends Engineer, structural: More than 4 times per year    Marital Status: Married  Recent Concern: Social Connections - Moderately Isolated (05/28/2023)   Social Connection and Isolation Panel [NHANES]    Frequency of Communication with Friends and Family: Once a week    Frequency of Social Gatherings with Friends and Family: Once a week    Attends Religious Services: 1 to 4 times per year    Active Member of [employer] or Organizations: No    Attends Engineer, structural: Not on file    Marital Status: Married   Past Surgical History:  Procedure Laterality Date   ANTERIOR CERVICAL DECOMPRESSION/DISCECTOMY FUSION 4 LEVELS N/A 08/11/2017   Procedure: Cervical three to Cervical seven Anterior Cervical discectomy with fusion/plate fixation;  Surgeon: Ditty, Loura Halt, MD;  Location: [facility] OR;  Service: Neurosurgery;  Laterality: N/A;   BACK SURGERY     HERNIA REPAIR     inguinal hernia repair in early 2000   LEG SURGERY     POSTERIOR CERVICAL FUSION/FORAMINOTOMY N/A 08/12/2018   Procedure: POSTERIOR CERVICAL FUSION WITH LATERAL MASS FIXATION CERVICAL THREE TO CERVICAL SEVEN;  Surgeon: Lisbeth Renshaw, MD;  Location: [facility] OR;  Service: Neurosurgery;  Laterality: N/A;   SHOULDER ARTHROSCOPY WITH ROTATOR CUFF REPAIR AND SUBACROMIAL DECOMPRESSION Right 02/02/2018   Procedure: RIGHT SHOULDER ARTHROSCOPY WITH SUBACROMIAL DECOMPRESSION, MINI-OPEN ROTATOR CUFF REPAIR;  Surgeon: Cammy Copa, MD;  Location[facility] OR;  Service: Orthopedics;   Laterality: Right;   SHOULDER SURGERY Right    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   Procedure: LEFT TOTAL HIP ARTHROPLASTY ANTERIOR APPROACH;  Surgeon: Tarry Kos, MD;  Location: [facility] OR;  Service: Orthopedics;  Laterality: Left;   Past Surgical History:  Procedure Laterality Date   ANTERIOR CERVICAL DECOMPRESSION/DISCECTOMY FUSION 4 LEVELS N/A 08/11/2017   Procedure: Cervical three to Cervical seven Anterior Cervical discectomy with fusion/plate fixation;  Surgeon: Ditty, Loura Halt, MD;  Location: [facility] OR;  Service: Neurosurgery;  Laterality: N/A;   BACK SURGERY     HERNIA REPAIR     inguinal hernia repair in early 2000   LEG SURGERY     POSTERIOR CERVICAL FUSION/FORAMINOTOMY N/A 08/12/2018   Procedure: POSTERIOR CERVICAL FUSION WITH LATERAL MASS FIXATION CERVICAL THREE TO CERVICAL SEVEN;  Surgeon: Lisbeth Renshaw, MD;  Location: [facility] OR;  Service: Neurosurgery;  Laterality: N/A;   SHOULDER ARTHROSCOPY WITH ROTATOR CUFF REPAIR AND SUBACROMIAL DECOMPRESSION Right 02/02/2018   Procedure: RIGHT SHOULDER ARTHROSCOPY WITH SUBACROMIAL DECOMPRESSION, MINI-OPEN ROTATOR CUFF REPAIR;  Surgeon: Cammy Copa, MD;  Location: [facility] OR;  Service: Orthopedics;  Laterality: Right;   SHOULDER SURGERY Right    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   Procedure: LEFT TOTAL HIP ARTHROPLASTY ANTERIOR APPROACH;  Surgeon: Tarry Kos, MD;  Location: [facility] OR;  Service: Orthopedics;  Laterality: Left;   Past Medical History:  Diagnosis Date   Arthritis    Cerebral palsy (HCC)    Cervical radiculopathy    H/O umbilical hernia repair 2001   Pre-diabetes    Scoliosis    Spondylosis of cervical spine    Ht 5' 4.8" (1.646 m)   Wt 113 lb (51.3 kg)   BMI 18.92 kg/m   Opioid Risk Score:   Fall Risk Score:  `1  Depression screen [facility] 2/9     07/06/2023    2:45 PM 06/12/2023   10:28 AM 05/29/2023    9:46 AM 05/29/2023    9:45 AM 05/26/2023   11:14 AM 04/27/2023    9:35 AM 03/04/2023   10:50 AM   Depression screen PHQ 2/9  Decreased Interest 1 0 3 3 0 2 2  Down, Depressed, Hopeless 1 0 1 1 0 1 1  PHQ - 2 Score 2 0 4 4 0 3 3  Altered sleeping  0 3 3  3 3   Tired, decreased energy  0 1 1  2 2   Change in appetite  0 2 2  3 1   Feeling bad or failure about yourself   0 1 1  1  0  Trouble concentrating  0 1 1  1 1   Moving slowly or fidgety/restless  0 1 1  0 0  Suicidal thoughts  0 0 0  0 0  PHQ-9 Score  0 13 13  13 10   Difficult doing work/chores  Not difficult at all Extremely dIfficult Extremely dIfficult   Somewhat difficult    Review of Systems  Musculoskeletal:  Positive for back pain.       B/L shoulder LT leg LT hip pain  All other systems reviewed and are negative.      Objective:       Assessment & Plan:  Cervicalgia/ Cervical Radiculitis: Continue HEP as Tolerated. Continue to Monitor. Continue Gabapentin. Continue to Monitor.  Chronic Right Shoulder Pain:  Continue HEP as Tolerated. Continue to Monitor. Ortho Following.  Failed Back Syndrome: Continue HEP as Tolerated. Continue to Monitor.  Chronic Bilateral Low Back Pain without Sciatica: Continue HEP as Tolerated. Continue current medication regimen. Continue to Monitor.  Chronic Left Hip Pain: S/P On 09/10/2020: Dr Roda Shutters LEFT TOTAL HIP ARTHROPLASTY ANTERIOR APPROACH Continue HEP as Tolerated. Continue current medication regimen. Continue to Monitor.  Chronic Left knee Pain: Continue HEP as Tolerated. Continue to Monitor.  Left Foot Drop: Wearing AFO: [employer] Following. Continue to Monitor.   Chronic Pain Syndrome: Increase Oxycodone 10/325 mg one tablet 6 times a day as needed for pain #180. We will continue the opioid monitoring program, this consists of regular clinic visits, examinations, urine drug screen, pill counts as well as use of [place] Controlled Substance Reporting system. A 12 month History has been reviewed on the [place] Controlled Substance Reporting System on 05/26/2023  -CT left hip  ordered  Insomnia: increase gabapentin to 400mg  HS.  Recommended  tart cherry juice, valerian and chamomile teas, continue tart cherry juice Recommended that he minimize light and screens at night Recommended that he shoulder get sunlight exposure during the day  BPH:  Prescribed flomax

## 2023-07-09 ENCOUNTER — Other Ambulatory Visit: Payer: Self-pay | Admitting: Physician Assistant

## 2023-07-09 DIAGNOSIS — D509 Iron deficiency anemia, unspecified: Secondary | ICD-10-CM

## 2023-07-10 ENCOUNTER — Inpatient Hospital Stay: Payer: 59 | Attending: Oncology

## 2023-07-10 ENCOUNTER — Inpatient Hospital Stay (HOSPITAL_BASED_OUTPATIENT_CLINIC_OR_DEPARTMENT_OTHER): Payer: 59 | Admitting: Physician Assistant

## 2023-07-10 VITALS — BP 135/80 | HR 68 | Temp 99.1°F | Resp 18 | Wt 120.1 lb

## 2023-07-10 DIAGNOSIS — Z7982 Long term (current) use of aspirin: Secondary | ICD-10-CM | POA: Diagnosis not present

## 2023-07-10 DIAGNOSIS — Z79899 Other long term (current) drug therapy: Secondary | ICD-10-CM | POA: Insufficient documentation

## 2023-07-10 DIAGNOSIS — F17210 Nicotine dependence, cigarettes, uncomplicated: Secondary | ICD-10-CM | POA: Diagnosis not present

## 2023-07-10 DIAGNOSIS — D509 Iron deficiency anemia, unspecified: Secondary | ICD-10-CM | POA: Diagnosis not present

## 2023-07-10 LAB — CBC WITH DIFFERENTIAL (CANCER CENTER ONLY)
Abs Immature Granulocytes: 0.01 10*3/uL (ref 0.00–0.07)
Basophils Absolute: 0.1 10*3/uL (ref 0.0–0.1)
Basophils Relative: 1 %
Eosinophils Absolute: 0.3 10*3/uL (ref 0.0–0.5)
Eosinophils Relative: 4 %
HCT: 35 % — ABNORMAL LOW (ref 39.0–52.0)
Hemoglobin: 11.8 g/dL — ABNORMAL LOW (ref 13.0–17.0)
Immature Granulocytes: 0 %
Lymphocytes Relative: 24 %
Lymphs Abs: 1.6 10*3/uL (ref 0.7–4.0)
MCH: 27.7 pg (ref 26.0–34.0)
MCHC: 33.7 g/dL (ref 30.0–36.0)
MCV: 82.2 fL (ref 80.0–100.0)
Monocytes Absolute: 0.7 10*3/uL (ref 0.1–1.0)
Monocytes Relative: 10 %
Neutro Abs: 4.1 10*3/uL (ref 1.7–7.7)
Neutrophils Relative %: 61 %
Platelet Count: 197 10*3/uL (ref 150–400)
RBC: 4.26 MIL/uL (ref 4.22–5.81)
RDW: 13.9 % (ref 11.5–15.5)
WBC Count: 6.6 10*3/uL (ref 4.0–10.5)
nRBC: 0 % (ref 0.0–0.2)

## 2023-07-10 LAB — IRON AND IRON BINDING CAPACITY (CC-WL,HP ONLY)
Iron: 36 ug/dL — ABNORMAL LOW (ref 45–182)
Saturation Ratios: 9 % — ABNORMAL LOW (ref 17.9–39.5)
TIBC: 424 ug/dL (ref 250–450)
UIBC: 388 ug/dL — ABNORMAL HIGH (ref 117–376)

## 2023-07-10 LAB — FERRITIN: Ferritin: 32 ng/mL (ref 24–336)

## 2023-07-10 NOTE — Progress Notes (Unsigned)
[facility] [phone]   [phone]  PROGRESS NOTE  Patient Care Team: [place], Betty G, MD as PCP - General (Family Medicine) [place], Betty G, MD (Family Medicine) Astrid Drafts Zadie Cleverly, RN as Case Manager Tarry Kos, MD as Attending Physician (Orthopedic Surgery)  CHIEF COMPLAINTS/PURPOSE OF CONSULTATION:  Iron deficiency anemia  HISTORY OF PRESENTING ILLNESS:  Philip Bates 62 y.o. male returns for a follow up for iron deficiency anemia. He was last seen by Dr. Angelene Giovanni on 04/03/2023. In the interim, he continues on PO iron therapy.   On exam today, Philip Bates reports his energy levels are stable. He has occasional fatigue but is able to complete his baseline ADLs. He denies any appetite changes or dietary restrictions. He denies nausea, vomiting or bowel habit changes. He denies easy bruising or signs of bleeding including hematochezia or melena. He denies fevers, chills, sweats, shortness of breath, chest pain or cough. He has no other complaints. Rest of the ROS is below.   MEDICAL HISTORY:  Past Medical History:  Diagnosis Date   Arthritis    Cerebral palsy (HCC)    Cervical radiculopathy    H/O umbilical hernia repair 2001   Pre-diabetes    Scoliosis    Spondylosis of cervical spine     SURGICAL HISTORY: Past Surgical History:  Procedure Laterality Date   ANTERIOR CERVICAL DECOMPRESSION/DISCECTOMY FUSION 4 LEVELS N/A 08/11/2017   Procedure: Cervical three to Cervical seven Anterior Cervical discectomy with fusion/plate fixation;  Surgeon: Ditty, Loura Halt, MD;  Location: [facility] OR;  Service: Neurosurgery;  Laterality: N/A;   BACK SURGERY     HERNIA REPAIR     inguinal hernia repair in early 2000   LEG SURGERY     POSTERIOR CERVICAL FUSION/FORAMINOTOMY N/A 08/12/2018   Procedure: POSTERIOR CERVICAL FUSION WITH LATERAL MASS FIXATION CERVICAL THREE TO CERVICAL SEVEN;  Surgeon: Lisbeth Renshaw, MD;  Location: [facility] OR;  Service:  Neurosurgery;  Laterality: N/A;   SHOULDER ARTHROSCOPY WITH ROTATOR CUFF REPAIR AND SUBACROMIAL DECOMPRESSION Right 02/02/2018   Procedure: RIGHT SHOULDER ARTHROSCOPY WITH SUBACROMIAL DECOMPRESSION, MINI-OPEN ROTATOR CUFF REPAIR;  Surgeon: Cammy Copa, MD;  Location: [facility] OR;  Service: Orthopedics;  Laterality: Right;   SHOULDER SURGERY Right    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   Procedure: LEFT TOTAL HIP ARTHROPLASTY ANTERIOR APPROACH;  Surgeon: Tarry Kos, MD;  Location: [facility] OR;  Service: Orthopedics;  Laterality: Left;    SOCIAL HISTORY: Social History   Socioeconomic History   Marital status: Married    Spouse name: Not on file   Number of children: Not on file   Years of education: Not on file   Highest education level: Some college, no degree  Occupational History   Not on file  Tobacco Use   Smoking status: Every Day    Current packs/day: 1.00    Average packs/day: 1 pack/day for 35.0 years (35.0 ttl pk-yrs)    Types: Cigarettes   Smokeless tobacco: Never  Vaping Use   Vaping status: Never Used  Substance and Sexual Activity   Alcohol use: Never   Drug use: Never   Sexual activity: Yes  Other Topics Concern   Not on file  Social History Narrative   ** Merged History Encounter **       Social Determinants of Health   Financial Resource Strain: Low Risk  (06/12/2023)   Overall Financial Resource Strain (CARDIA)    Difficulty of Paying Living Expenses: Not hard at all  Recent  Concern: Financial Resource Strain - Medium Risk (05/28/2023)   Overall Financial Resource Strain (CARDIA)    Difficulty of Paying Living Expenses: Somewhat hard  Food Insecurity: No Food Insecurity (06/12/2023)   Hunger Vital Sign    Worried About Running Out of Food in the Last Year: Never true    Ran Out of Food in the Last Year: Never true  Recent Concern: Food Insecurity - Food Insecurity Present (05/28/2023)   Hunger Vital Sign    Worried About Running Out of Food in the Last  Year: Sometimes true    Ran Out of Food in the Last Year: Sometimes true  Transportation Needs: No Transportation Needs (06/12/2023)   PRAPARE - Administrator, Civil Service (Medical): No    Lack of Transportation (Non-Medical): No  Recent Concern: Transportation Needs - Unmet Transportation Needs (05/28/2023)   PRAPARE - Transportation    Lack of Transportation (Medical): No    Lack of Transportation (Non-Medical): Yes  Physical Activity: Insufficiently Active (06/12/2023)   Exercise Vital Sign    Days of Exercise per Week: 3 days    Minutes of Exercise per Session: 40 min  Stress: No Stress Concern Present (06/12/2023)   [employer] of Occupational Health - Occupational Stress Questionnaire    Feeling of Stress : Not at all  Recent Concern: Stress - Stress Concern Present (05/28/2023)   [employer] of Occupational Health - Occupational Stress Questionnaire    Feeling of Stress : Very much  Social Connections: Socially Integrated (06/12/2023)   Social Connection and Isolation Panel [NHANES]    Frequency of Communication with Friends and Family: More than three times a week    Frequency of Social Gatherings with Friends and Family: More than three times a week    Attends Religious Services: More than 4 times per year    Active Member of [employer] or Organizations: Yes    Attends Engineer, structural: More than 4 times per year    Marital Status: Married  Recent Concern: Social Connections - Moderately Isolated (05/28/2023)   Social Connection and Isolation Panel [NHANES]    Frequency of Communication with Friends and Family: Once a week    Frequency of Social Gatherings with Friends and Family: Once a week    Attends Religious Services: 1 to 4 times per year    Active Member of [employer] or Organizations: No    Attends Engineer, structural: Not on file    Marital Status: Married  Catering manager Violence: Not At Risk (06/12/2023)   Humiliation, Afraid, Rape,  and Kick questionnaire    Fear of Current or Ex-Partner: No    Emotionally Abused: No    Physically Abused: No    Sexually Abused: No    FAMILY HISTORY: Family History  Problem Relation Age of Onset   Cancer Father        esophagus   Diabetes Brother    Diabetes Paternal Uncle     ALLERGIES:  has No Known Allergies.  MEDICATIONS:  Current Outpatient Medications  Medication Sig Dispense Refill   aspirin EC 81 MG tablet Take 1 tablet (81 mg total) by mouth 2 (two) times daily. 84 tablet 0   ergocalciferol (VITAMIN D2) 1.25 MG (50000 UT) capsule 1 (ONE) CAPSULE CAPSULE BY MOUTH ONCE A WEEK     gabapentin (NEURONTIN) 400 MG capsule Take 1 capsule (400 mg total) by mouth 3 (three) times daily. 90 capsule 3   ibuprofen (ADVIL) 800 MG tablet  Take 800 mg by mouth 3 (three) times daily as needed.     latanoprost (XALATAN) 0.005 % ophthalmic solution SMARTSIG:1 Drop(s) In Eye(s) Every Evening     NARCAN 4 MG/0.1ML LIQD nasal spray kit Place 1 spray into the nose as needed (opioid overdose).     oxyCODONE-acetaminophen (PERCOCET) 10-325 MG tablet Take 1 tablet by mouth every 4 (four) hours as needed. Do Not Fill Before 06/07/2023 180 tablet 0   sildenafil (REVATIO) 20 MG tablet TAKE 2 TO 3 TABLETS BY MOUTH ONCE DAILY AS NEEDED 90 tablet 3   tamsulosin (FLOMAX) 0.4 MG CAPS capsule Take 1 capsule (0.4 mg total) by mouth daily after supper. 90 capsule 3   traZODone (DESYREL) 50 MG tablet Take 50 mg by mouth at bedtime. (Patient not taking: Reported on 07/06/2023)     No current facility-administered medications for this visit.    REVIEW OF SYSTEMS:   Constitutional: ( - ) fevers, ( - )  chills , ( - ) night sweats Eyes: ( - ) blurriness of vision, ( - ) double vision, ( - ) watery eyes Ears, nose, mouth, throat, and face: ( - ) mucositis, ( - ) sore throat Respiratory: ( - ) cough, ( - ) dyspnea, ( - ) wheezes Cardiovascular: ( - ) palpitation, ( - ) chest discomfort, ( - ) lower extremity  swelling Gastrointestinal:  ( - ) nausea, ( - ) heartburn, ( - ) change in bowel habits Skin: ( - ) abnormal skin rashes Lymphatics: ( - ) new lymphadenopathy, ( - ) easy bruising Neurological: ( - ) numbness, ( - ) tingling, ( - ) new weaknesses Behavioral/Psych: ( - ) mood change, ( - ) new changes  All other systems were reviewed with the patient and are negative.  PHYSICAL EXAMINATION: ECOG PERFORMANCE STATUS: 1 - Symptomatic but completely ambulatory  Vitals:   07/10/23 1327  BP: 135/80  Pulse: 68  Resp: 18  Temp: 99.1 F (37.3 C)  SpO2: 100%   Filed Weights   07/10/23 1327  Weight: 120 lb 1.6 oz (54.5 kg)    GENERAL: well appearing male in NAD  SKIN: skin color, texture, turgor are normal, no rashes or significant lesions EYES: conjunctiva are pink and non-injected, sclera clear LUNGS: clear to auscultation and percussion with normal breathing effort HEART: regular rate & rhythm and no murmurs and no lower extremity edema Musculoskeletal: no cyanosis of digits and no clubbing  PSYCH: alert & oriented x 3, fluent speech NEURO: no focal motor/sensory deficits  LABORATORY DATA:  I have reviewed the data as listed    Latest Ref Rng & Units 07/10/2023   12:55 PM 02/20/2023    3:56 PM 04/30/2021   11:49 AM  CBC  WBC 4.0 - 10.5 K/uL 6.6  5.2  4.9   Hemoglobin 13.0 - 17.0 g/dL 16.1  09.6  04.5   Hematocrit 39.0 - 52.0 % 35.0  38.3  41.2   Platelets 150 - 400 K/uL 197  255  228.0        Latest Ref Rng & Units 02/20/2023    3:56 PM 04/30/2022    2:07 PM 04/30/2021   11:49 AM  CMP  Glucose 70 - 99 mg/dL 83  97  78   BUN 6 - 20 mg/dL 13  13  11    Creatinine 0.61 - 1.24 mg/dL 4.09  8.11  9.14   Sodium 135 - 145 mmol/L 139  137  140   Potassium  3.5 - 5.1 mmol/L 3.8  3.4  3.8   Chloride 98 - 111 mmol/L 105  100  104   CO2 22 - 32 mmol/L 29  33  30   Calcium 8.9 - 10.3 mg/dL 9.3  9.1  9.5   Total Protein 6.5 - 8.1 g/dL 7.5  6.7    Total Bilirubin 0.3 - 1.2 mg/dL 0.4   0.4    Alkaline Phos 38 - 126 U/L 70  54    AST 15 - 41 U/L 26  26    ALT 0 - 44 U/L 32  24     RADIOGRAPHIC STUDIES: I have personally reviewed the radiological images as listed and agreed with the findings in the report. VAS US CAROTID  Result Date: 07/01/2023 Carotid Arterial Duplex Study Patient Name:  Philip Bates  Date of Exam:   07/01/2023 Medical Rec #: [id number]     Accession #:    [id number] Date of Birth: [birth year]      Patient Gender: M Patient Age:   61 years Exam Location:  [facility] Procedure:      VAS US CAROTID Referring Phys: Kathie Rhodes [place] --------------------------------------------------------------------------------  Indications:  Patient reports having a previous carotid exam which showed ~50%               stenosis. He denies any cerebrovasclar symptoms at this time. Risk Factors: Current smoker, PAD. Performing Technologist: Olegario Shearer RVT  Examination Guidelines: A complete evaluation includes B-mode imaging, spectral Doppler, color Doppler, and power Doppler as needed of all accessible portions of each vessel. Bilateral testing is considered an integral part of a complete examination. Limited examinations for reoccurring indications may be performed as noted.  Right Carotid Findings: +----------+--------+--------+--------+------------------+--------+           PSV cm/sEDV cm/sStenosisPlaque DescriptionComments +----------+--------+--------+--------+------------------+--------+ CCA Prox  78      14                                         +----------+--------+--------+--------+------------------+--------+ CCA Distal51      17                                         +----------+--------+--------+--------+------------------+--------+ ICA Prox  48      16                                         +----------+--------+--------+--------+------------------+--------+ ICA Mid   66      32      Normal                              +----------+--------+--------+--------+------------------+--------+ ICA Distal50      18                                         +----------+--------+--------+--------+------------------+--------+ ECA       55      13                                         +----------+--------+--------+--------+------------------+--------+ +----------+--------+-------+----------------+-------------------+  PSV cm/sEDV cmsDescribe        Arm Pressure (mmHG) +----------+--------+-------+----------------+-------------------+ [id number]            Multiphasic, WNL                    +----------+--------+-------+----------------+-------------------+ +---------+--------+--+--------+-+---------+ VertebralPSV cm/s32EDV cm/s9Antegrade +---------+--------+--+--------+-+---------+  Left Carotid Findings: +----------+--------+--------+--------+------------------+--------+           PSV cm/sEDV cm/sStenosisPlaque DescriptionComments +----------+--------+--------+--------+------------------+--------+ CCA Prox  51      14                                         +----------+--------+--------+--------+------------------+--------+ CCA Distal50      19                                         +----------+--------+--------+--------+------------------+--------+ ICA Prox  37      14                                         +----------+--------+--------+--------+------------------+--------+ ICA Mid   60      25      Normal                             +----------+--------+--------+--------+------------------+--------+ ICA Distal68      29                                         +----------+--------+--------+--------+------------------+--------+ ECA       44      8                                          +----------+--------+--------+--------+------------------+--------+ +----------+--------+--------+----------------+-------------------+           PSV cm/sEDV  cm/sDescribe        Arm Pressure (mmHG) +----------+--------+--------+----------------+-------------------+ [id number]              Multiphasic, WNL                    +----------+--------+--------+----------------+-------------------+ +---------+--------+--+--------+--+---------+ VertebralPSV cm/s67EDV cm/s19Antegrade +---------+--------+--+--------+--+---------+   Summary: Right Carotid: There is no evidence of stenosis in the right ICA. The                extracranial vessels were near-normal with only minimal wall                thickening or plaque. Left Carotid: There is no evidence of stenosis in the left ICA. The extracranial               vessels were near-normal with only minimal wall thickening or               plaque. Vertebrals:  Bilateral vertebral arteries demonstrate antegrade flow. Subclavians: Normal flow hemodynamics were seen in bilateral subclavian              arteries. *See table(s) above for measurements and observations.  Electronically signed by Julien Nordmann MD on 07/01/2023 at 11:50:31  AM.    Final     ASSESSMENT & PLAN Philip Bates is a 62 y.o. male who returns for a follow up for iron deficiency anemia.   #Iron deficiency anemia: --Etiology unknown. Patient denies any signs of bleeding. He is scheduled for GI consultation on 08/12/2023.  --Currently on PO iron replacement daily.  --Labs today show mild anemia with Hgb 11.8, MCV 82.2. Iron panel shows deficiency with serum iron 36, saturation 9%, ferritin 32 --Recommend IV iron to bolster levels --RTC in 3 months for labs/follow up.    No orders of the defined types were placed in this encounter.   All questions were answered. The patient knows to call the clinic with any problems, questions or concerns.  I have spent a total of 30 minutes minutes of face-to-face and non-face-to-face time, preparing to see the patient, performing a medically appropriate examination, counseling and educating the  patient, ordering medications/tests/procedures, documenting clinical information in the electronic health record,  and care coordination.   Georga Kaufmann, PA-C Department of Hematology/Oncology [facility] Phone: [phone]

## 2023-07-13 ENCOUNTER — Telehealth: Payer: Self-pay

## 2023-07-13 ENCOUNTER — Encounter: Payer: Self-pay | Admitting: Physician Assistant

## 2023-07-13 ENCOUNTER — Telehealth: Payer: Self-pay | Admitting: Physician Assistant

## 2023-07-13 NOTE — Telephone Encounter (Signed)
Auth Submission: NO AUTH NEEDED Site of care: Site of care: [facility] Payer: [employer] Medicare Medication & CPT/J Code(s) submitted: Feraheme (ferumoxytol) [id number] Route of submission (phone, fax, portal): portal Phone # Fax # Auth type: Buy/Bill PB Units/visits requested: 510mg  x 2 doses Reference number: [id number] Approval from: 07/13/23 to 10/06/23   I confirmed in the [facility] portal that Feraheme does not need a prior authorization for this patient.

## 2023-07-15 ENCOUNTER — Telehealth: Payer: Self-pay

## 2023-07-15 NOTE — Telephone Encounter (Signed)
-----   Message from Briant Cedar sent at 07/13/2023  8:47 AM EDT ----- Please notify patient that iron levels are low so we will arrange for IV iron at [facility]. ----- Message ----- From: Leory Plowman, Lab In [place] Sent: 07/10/2023   1:18 PM EDT To: Briant Cedar, PA-C

## 2023-07-15 NOTE — Telephone Encounter (Signed)
Pt advised of lab results and recommendations.  IV iron has been scheduled for 10/11 at 3:00 at [facility].  Pt is aware of this appt.

## 2023-07-17 ENCOUNTER — Ambulatory Visit: Payer: 59

## 2023-07-17 VITALS — BP 109/63 | HR 67 | Temp 98.0°F | Resp 16 | Ht 64.0 in | Wt 121.2 lb

## 2023-07-17 DIAGNOSIS — D509 Iron deficiency anemia, unspecified: Secondary | ICD-10-CM | POA: Diagnosis not present

## 2023-07-17 MED ORDER — SODIUM CHLORIDE 0.9 % IV SOLN
510.0000 mg | Freq: Once | INTRAVENOUS | Status: AC
  Administered 2023-07-17: 510 mg via INTRAVENOUS
  Filled 2023-07-17: qty 17

## 2023-07-17 NOTE — Progress Notes (Signed)
Diagnosis: Iron Deficiency Anemia  Provider:  Chilton Greathouse MD  Procedure: IV Infusion  IV Type: Peripheral, IV Location: L Forearm  Feraheme (Ferumoxytol), Dose: 510 mg  Infusion Start Time: 1459  pm  Infusion Stop Time: 1515 pm  Post Infusion IV Care: Observation period completed and Peripheral IV Discontinued  Discharge: Condition: Good, Destination: Home . AVS Declined  Performed by:  Loney Hering, LPN

## 2023-07-20 ENCOUNTER — Ambulatory Visit
Admission: RE | Admit: 2023-07-20 | Discharge: 2023-07-20 | Disposition: A | Discharge status: Home / Self Care | Payer: 59 | Source: Ambulatory Visit | Attending: Physical Medicine and Rehabilitation | Admitting: Physical Medicine and Rehabilitation

## 2023-07-20 DIAGNOSIS — Z96642 Presence of left artificial hip joint: Secondary | ICD-10-CM | POA: Diagnosis not present

## 2023-07-20 DIAGNOSIS — G8929 Other chronic pain: Secondary | ICD-10-CM | POA: Diagnosis not present

## 2023-07-20 DIAGNOSIS — M25552 Pain in left hip: Secondary | ICD-10-CM | POA: Diagnosis not present

## 2023-07-22 ENCOUNTER — Ambulatory Visit: Payer: 59 | Admitting: Registered Nurse

## 2023-07-24 ENCOUNTER — Ambulatory Visit (INDEPENDENT_AMBULATORY_CARE_PROVIDER_SITE_OTHER): Payer: 59

## 2023-07-24 ENCOUNTER — Encounter: Payer: 59 | Admitting: Registered Nurse

## 2023-07-24 VITALS — BP 124/77 | HR 63 | Temp 98.3°F | Resp 16 | Ht 64.0 in | Wt 113.0 lb

## 2023-07-24 DIAGNOSIS — D509 Iron deficiency anemia, unspecified: Secondary | ICD-10-CM

## 2023-07-24 MED ORDER — SODIUM CHLORIDE 0.9 % IV SOLN
510.0000 mg | Freq: Once | INTRAVENOUS | Status: AC
  Administered 2023-07-24: 510 mg via INTRAVENOUS
  Filled 2023-07-24: qty 17

## 2023-07-24 NOTE — Progress Notes (Signed)
Diagnosis: Iron Deficiency Anemia  Provider:  Chilton Greathouse MD  Procedure: IV Infusion  IV Type: Peripheral, IV Location: L Forearm  Feraheme (Ferumoxytol), Dose: 510 mg  Infusion Start Time: 1322  Infusion Stop Time: 1340  Post Infusion IV Care: Patient declined observation and Peripheral IV Discontinued  Discharge: Condition: Good, Destination: Home . AVS Provided  Performed by:  Nat Math, RN

## 2023-07-29 ENCOUNTER — Ambulatory Visit: Payer: 59 | Admitting: Registered Nurse

## 2023-08-05 ENCOUNTER — Encounter: Payer: 59 | Attending: Physical Medicine and Rehabilitation | Admitting: Registered Nurse

## 2023-08-05 ENCOUNTER — Other Ambulatory Visit: Payer: Self-pay | Admitting: *Deleted

## 2023-08-05 ENCOUNTER — Encounter: Payer: Self-pay | Admitting: Registered Nurse

## 2023-08-05 VITALS — BP 132/82 | HR 67 | Ht 64.0 in | Wt 118.0 lb

## 2023-08-05 DIAGNOSIS — Z5181 Encounter for therapeutic drug level monitoring: Secondary | ICD-10-CM | POA: Diagnosis not present

## 2023-08-05 DIAGNOSIS — Z122 Encounter for screening for malignant neoplasm of respiratory organs: Secondary | ICD-10-CM

## 2023-08-05 DIAGNOSIS — M542 Cervicalgia: Secondary | ICD-10-CM

## 2023-08-05 DIAGNOSIS — M25552 Pain in left hip: Secondary | ICD-10-CM | POA: Diagnosis not present

## 2023-08-05 DIAGNOSIS — G8929 Other chronic pain: Secondary | ICD-10-CM | POA: Diagnosis not present

## 2023-08-05 DIAGNOSIS — M545 Low back pain, unspecified: Secondary | ICD-10-CM | POA: Insufficient documentation

## 2023-08-05 DIAGNOSIS — Z79891 Long term (current) use of opiate analgesic: Secondary | ICD-10-CM

## 2023-08-05 DIAGNOSIS — M961 Postlaminectomy syndrome, not elsewhere classified: Secondary | ICD-10-CM

## 2023-08-05 DIAGNOSIS — G894 Chronic pain syndrome: Secondary | ICD-10-CM

## 2023-08-05 DIAGNOSIS — M25512 Pain in left shoulder: Secondary | ICD-10-CM | POA: Insufficient documentation

## 2023-08-05 DIAGNOSIS — F17210 Nicotine dependence, cigarettes, uncomplicated: Secondary | ICD-10-CM

## 2023-08-05 DIAGNOSIS — M5412 Radiculopathy, cervical region: Secondary | ICD-10-CM | POA: Diagnosis not present

## 2023-08-05 DIAGNOSIS — M25511 Pain in right shoulder: Secondary | ICD-10-CM | POA: Insufficient documentation

## 2023-08-05 DIAGNOSIS — Z87891 Personal history of nicotine dependence: Secondary | ICD-10-CM

## 2023-08-05 MED ORDER — OXYCODONE-ACETAMINOPHEN 10-325 MG PO TABS: 1.0000 | ORAL_TABLET | ORAL | 0 refills | Status: DC | PRN

## 2023-08-05 NOTE — Progress Notes (Signed)
Subjective:    Patient ID: Philip Bates, male    DOB: 1961-06-17, 62 y.o.   MRN: [id number]  HPI: [address] is a 62 y.o. male who returns for follow up appointment for chronic pain and medication refill.He  states his pain is located in his neck radiating into his bilateral shoulders, lower back pain and left hip pain. Marland KitchenHe rates his pain 8. He also reports his pain is under controlled with increase in his medication regimen last month, we will continue current medication regimen. His current exercise regime is walking and performing stretching exercises.  Philip Bates equivalent is 90.00 MME.   Last UDS was Performed on 04/27/2023, it was consistent.     Pain Inventory Average Pain 8 Pain Right Now 8 My pain is sharp, burning, dull, stabbing, tingling, and aching  In the last 24 hours, has pain interfered with the following? General activity 7 Relation with others 7 Enjoyment of life 7 What TIME of day is your pain at its worst? morning , daytime, evening, and night Sleep (in general) Poor  Pain is worse with: unsure Pain improves with: rest and medication Relief from Meds: 7  Family History  Problem Relation Age of Onset   Cancer Father        esophagus   Diabetes Brother    Diabetes Paternal Uncle    Social History   Socioeconomic History   Marital status: Married    Spouse name: Not on file   Number of children: Not on file   Years of education: Not on file   Highest education level: Some college, no degree  Occupational History   Not on file  Tobacco Use   Smoking status: Every Day    Current packs/day: 1.00    Average packs/day: 1 pack/day for 35.0 years (35.0 ttl pk-yrs)    Types: Cigarettes   Smokeless tobacco: Never  Vaping Use   Vaping status: Never Used  Substance and Sexual Activity   Alcohol use: Never   Drug use: Never   Sexual activity: Yes  Other Topics Concern   Not on file  Social History Narrative   ** Merged History Encounter **        Social Determinants of Health   Financial Resource Strain: Low Risk  (06/12/2023)   Overall Financial Resource Strain (CARDIA)    Difficulty of Paying Living Expenses: Not hard at all  Recent Concern: Financial Resource Strain - Medium Risk (05/28/2023)   Overall Financial Resource Strain (CARDIA)    Difficulty of Paying Living Expenses: Somewhat hard  Food Insecurity: No Food Insecurity (06/12/2023)   Hunger Vital Sign    Worried About Running Out of Food in the Last Year: Never true    Ran Out of Food in the Last Year: Never true  Recent Concern: Food Insecurity - Food Insecurity Present (05/28/2023)   Hunger Vital Sign    Worried About Running Out of Food in the Last Year: Sometimes true    Ran Out of Food in the Last Year: Sometimes true  Transportation Needs: No Transportation Needs (06/12/2023)   PRAPARE - Administrator, Civil Service (Medical): No    Lack of Transportation (Non-Medical): No  Recent Concern: Transportation Needs - Unmet Transportation Needs (05/28/2023)   PRAPARE - Transportation    Lack of Transportation (Medical): No    Lack of Transportation (Non-Medical): Yes  Physical Activity: Insufficiently Active (06/12/2023)   Exercise Vital Sign    Days of Exercise per  Week: 3 days    Minutes of Exercise per Session: 40 min  Stress: No Stress Concern Present (06/12/2023)   [employer] of Occupational Health - Occupational Stress Questionnaire    Feeling of Stress : Not at all  Recent Concern: Stress - Stress Concern Present (05/28/2023)   [employer] of Occupational Health - Occupational Stress Questionnaire    Feeling of Stress : Very much  Social Connections: Socially Integrated (06/12/2023)   Social Connection and Isolation Panel [NHANES]    Frequency of Communication with Friends and Family: More than three times a week    Frequency of Social Gatherings with Friends and Family: More than three times a week    Attends Religious Services: More  than 4 times per year    Active Member of [employer] or Organizations: Yes    Attends Engineer, structural: More than 4 times per year    Marital Status: Married  Recent Concern: Social Connections - Moderately Isolated (05/28/2023)   Social Connection and Isolation Panel [NHANES]    Frequency of Communication with Friends and Family: Once a week    Frequency of Social Gatherings with Friends and Family: Once a week    Attends Religious Services: 1 to 4 times per year    Active Member of [employer] or Organizations: No    Attends Engineer, structural: Not on file    Marital Status: Married   Past Surgical History:  Procedure Laterality Date   ANTERIOR CERVICAL DECOMPRESSION/DISCECTOMY FUSION 4 LEVELS N/A 08/11/2017   Procedure: Cervical three to Cervical seven Anterior Cervical discectomy with fusion/plate fixation;  Surgeon: Ditty, Loura Halt, MD;  Location: [facility] OR;  Service: Neurosurgery;  Laterality: N/A;   BACK SURGERY     HERNIA REPAIR     inguinal hernia repair in early 2000   LEG SURGERY     POSTERIOR CERVICAL FUSION/FORAMINOTOMY N/A 08/12/2018   Procedure: POSTERIOR CERVICAL FUSION WITH LATERAL MASS FIXATION CERVICAL THREE TO CERVICAL SEVEN;  Surgeon: Lisbeth Renshaw, MD;  Location: [facility] OR;  Service: Neurosurgery;  Laterality: N/A;   SHOULDER ARTHROSCOPY WITH ROTATOR CUFF REPAIR AND SUBACROMIAL DECOMPRESSION Right 02/02/2018   Procedure: RIGHT SHOULDER ARTHROSCOPY WITH SUBACROMIAL DECOMPRESSION, MINI-OPEN ROTATOR CUFF REPAIR;  Surgeon: Cammy Copa, MD;  Location: [facility] OR;  Service: Orthopedics;  Laterality: Right;   SHOULDER SURGERY Right    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   Procedure: LEFT TOTAL HIP ARTHROPLASTY ANTERIOR APPROACH;  Surgeon: Tarry Kos, MD;  Location: [facility] OR;  Service: Orthopedics;  Laterality: Left;   Past Surgical History:  Procedure Laterality Date   ANTERIOR CERVICAL DECOMPRESSION/DISCECTOMY FUSION 4 LEVELS N/A 08/11/2017   Procedure:  Cervical three to Cervical seven Anterior Cervical discectomy with fusion/plate fixation;  Surgeon: Ditty, Loura Halt, MD;  Location: [facility] OR;  Service: Neurosurgery;  Laterality: N/A;   BACK SURGERY     HERNIA REPAIR     inguinal hernia repair in early 2000   LEG SURGERY     POSTERIOR CERVICAL FUSION/FORAMINOTOMY N/A 08/12/2018   Procedure: POSTERIOR CERVICAL FUSION WITH LATERAL MASS FIXATION CERVICAL THREE TO CERVICAL SEVEN;  Surgeon: Lisbeth Renshaw, MD;  Location: [facility] OR;  Service: Neurosurgery;  Laterality: N/A;   SHOULDER ARTHROSCOPY WITH ROTATOR CUFF REPAIR AND SUBACROMIAL DECOMPRESSION Right 02/02/2018   Procedure: RIGHT SHOULDER ARTHROSCOPY WITH SUBACROMIAL DECOMPRESSION, MINI-OPEN ROTATOR CUFF REPAIR;  Surgeon: Cammy Copa, MD;  Location: [facility] OR;  Service: Orthopedics;  Laterality: Right;   SHOULDER SURGERY Right    TOTAL HIP  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/10/2020   Procedure: LEFT TOTAL HIP ARTHROPLASTY ANTERIOR APPROACH;  Surgeon: Tarry Kos, MD;  Location: [facility] OR;  Service: Orthopedics;  Laterality: Left;   Past Medical History:  Diagnosis Date   Arthritis    Cerebral palsy (HCC)    Cervical radiculopathy    H/O umbilical hernia repair 2001   Pre-diabetes    Scoliosis    Spondylosis of cervical spine    BP 132/82   Pulse 67   Ht 5\' 4"  (1.626 m)   Wt 118 lb (53.5 kg)   SpO2 98%   BMI 20.25 kg/m   Opioid Risk Score:   Fall Risk Score:  `1  Depression screen [facility] 2/9     07/06/2023    2:45 PM 06/12/2023   10:28 AM 05/29/2023    9:46 AM 05/29/2023    9:45 AM 05/26/2023   11:14 AM 04/27/2023    9:35 AM 03/04/2023   10:50 AM  Depression screen PHQ 2/9  Decreased Interest 1 0 3 3 0 2 2  Down, Depressed, Hopeless 1 0 1 1 0 1 1  PHQ - 2 Score 2 0 4 4 0 3 3  Altered sleeping  0 3 3  3 3   Tired, decreased energy  0 1 1  2 2   Change in appetite  0 2 2  3 1   Feeling bad or failure about yourself   0 1 1  1  0  Trouble concentrating  0 1 1  1 1   Moving slowly or  fidgety/restless  0 1 1  0 0  Suicidal thoughts  0 0 0  0 0  PHQ-9 Score  0 13 13  13 10   Difficult doing work/chores  Not difficult at all Extremely dIfficult Extremely dIfficult   Somewhat difficult     Review of Systems  Musculoskeletal:        Bilateral shoulder pain  Left hip pain Back of lower leg pain  All other systems reviewed and are negative.     Objective:   Physical Exam Vitals and nursing note reviewed.  Constitutional:      Appearance: Normal appearance.  Neck:     Comments: Cervical Paraspinal Tenderness: C-5-C-6 Cardiovascular:     Rate and Rhythm: Normal rate and regular rhythm.     Pulses: Normal pulses.     Heart sounds: Normal heart sounds.  Pulmonary:     Effort: Pulmonary effort is normal.     Breath sounds: Normal breath sounds.  Musculoskeletal:     Comments: Normal Muscle Bulk and Muscle Testing Reveals:  Upper Extremities: Full ROM and Muscle Strength 5/5  Thoracic Paraspinal Tenderness: T-7-T-9 Left Greater Trochanter Tenderness  Lower Extremities: Full ROM and Muscle Strength 5/5 Arises from Table slowly using cane for support Antalgic  Gait     Skin:    General: Skin is warm and dry.  Neurological:     Mental Status: He is alert and oriented to person, place, and time.  Psychiatric:        Mood and Affect: Mood normal.        Behavior: Behavior normal.         Assessment & Plan:  Cervicalgia/ Cervical Radiculitis: Continue HEP as Tolerated. Continue to Monitor. Continue Gabapentin. Continue to Monitor. 08/05/2023 Chronic Bilateral;  Shoulder Pain:  Continue HEP as Tolerated. Continue to Monitor. Ortho Following. 08/05/2023 Failed Back Syndrome: Continue HEP as Tolerated. Continue to Monitor. 08/05/2023 Chronic Bilateral Low Back Pain without Sciatica: Continue  HEP as Tolerated. Continue current medication regimen. Continue to Monitor. 08/05/2023 Chronic Left Hip Pain: S/P On 09/10/2020: Dr Roda Shutters LEFT TOTAL HIP ARTHROPLASTY ANTERIOR  APPROACH Continue HEP as Tolerated. Continue current medication regimen. Continue to Monitor. 08/05/2023 Chronic Left knee Pain: Continue HEP as Tolerated. Continue to Monitor. No complaints today. Continue to Monitor. 08/05/2023 Left Foot Drop: Wearing AFO: [employer] Following. Continue to Monitor. 08/05/2023 Chronic Pain Syndrome: Refilled Oxycodone 10/325 mg one tablet every 4 hours as needed for pain #180.  We will continue the opioid monitoring program, this consists of regular clinic visits, examinations, urine drug screen, pill counts as well as use of [place] Controlled Substance Reporting system. A 12 month History has been reviewed on the [place] Controlled Substance Reporting System on 08/05/2023   F/U in 1 month

## 2023-08-11 ENCOUNTER — Encounter: Payer: Self-pay | Admitting: Orthopaedic Surgery

## 2023-08-11 ENCOUNTER — Encounter: Payer: 59 | Attending: Physical Medicine and Rehabilitation | Admitting: Physical Medicine and Rehabilitation

## 2023-08-11 ENCOUNTER — Ambulatory Visit (INDEPENDENT_AMBULATORY_CARE_PROVIDER_SITE_OTHER): Payer: 59 | Admitting: Orthopaedic Surgery

## 2023-08-11 DIAGNOSIS — G8929 Other chronic pain: Secondary | ICD-10-CM

## 2023-08-11 DIAGNOSIS — Z96642 Presence of left artificial hip joint: Secondary | ICD-10-CM | POA: Diagnosis not present

## 2023-08-11 NOTE — Progress Notes (Signed)
Left message to discuss [place] results
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
## 2023-08-12 ENCOUNTER — Encounter: Payer: Self-pay | Admitting: Physician Assistant

## 2023-08-12 ENCOUNTER — Encounter: Payer: Self-pay | Admitting: Acute Care

## 2023-08-12 ENCOUNTER — Ambulatory Visit (INDEPENDENT_AMBULATORY_CARE_PROVIDER_SITE_OTHER): Payer: 59 | Admitting: Physician Assistant

## 2023-08-12 ENCOUNTER — Ambulatory Visit: Payer: 59 | Admitting: Acute Care

## 2023-08-12 VITALS — BP 110/60 | HR 74 | Ht 64.0 in | Wt 118.0 lb

## 2023-08-12 DIAGNOSIS — D509 Iron deficiency anemia, unspecified: Secondary | ICD-10-CM

## 2023-08-12 DIAGNOSIS — F17210 Nicotine dependence, cigarettes, uncomplicated: Secondary | ICD-10-CM | POA: Diagnosis not present

## 2023-08-12 MED ORDER — NA SULFATE-K SULFATE-MG SULF 17.5-3.13-1.6 GM/177ML PO SOLN: 1.0000 | Freq: Once | ORAL | 0 refills | Status: AC

## 2023-08-12 NOTE — Patient Instructions (Signed)

## 2023-08-12 NOTE — Progress Notes (Signed)
Virtual Visit via Telephone Note  I connected with Philip Bates on 08/12/23 at  9:00 AM EST by telephone and verified that I am speaking with the correct person using two identifiers.  Location: Patient:  At home Provider: [address], [place], [place], [address]    I discussed the limitations, risks, security and privacy concerns of performing an evaluation and management service by telephone and the availability of in person appointments. I also discussed with the patient that there may be a patient responsible charge related to this service. The patient expressed understanding and agreed to proceed.   Shared Decision Making Visit Lung Cancer Screening Program [phone])   Eligibility: Age 62 y.o. Pack Years Smoking History Calculation 35 pack year smoking history (# packs/per year x # years smoked) Recent History of coughing up blood  no Unexplained weight loss? no ( >Than 15 pounds within the last 6 months ) Prior History Lung / other cancer no (Diagnosis within the last 5 years already requiring surveillance chest CT Scans). Smoking Status Current Smoker Former Smokers: Years since quit:  NA  Quit Date:  NA  Visit Components: Discussion included one or more decision making aids. yes Discussion included risk/benefits of screening. yes Discussion included potential follow up diagnostic testing for abnormal scans. yes Discussion included meaning and risk of over diagnosis. yes Discussion included meaning and risk of False Positives. yes Discussion included meaning of total radiation exposure. yes  Counseling Included: Importance of adherence to annual lung cancer LDCT screening. yes Impact of comorbidities on ability to participate in the program. yes Ability and willingness to under diagnostic treatment. yes  Smoking Cessation Counseling: Current Smokers:  Discussed importance of smoking cessation. yes Information about tobacco cessation classes and interventions  provided to patient. yes Patient provided with "ticket" for LDCT Scan. yes Symptomatic Patient. no  Counseling NA Diagnosis Code: Tobacco Use Z72.0 Asymptomatic Patient yes  Counseling (Intermediate counseling: > three minutes counseling) [id number] Former Smokers:  Discussed the importance of maintaining cigarette abstinence. yes Diagnosis Code: Personal History of Nicotine Dependence. X38.182 Information about tobacco cessation classes and interventions provided to patient. Yes Patient provided with "ticket" for LDCT Scan. yes Written Order for Lung Cancer Screening with LDCT placed in Epic. Yes (CT Chest Lung Cancer Screening Low Dose W/O CM) [id number] Z12.2-Screening of respiratory organs Z87.891-Personal history of nicotine dependence  Counseled on smoking cessation x 3 minutes   Bevelyn Ngo, NP 08/12/2023

## 2023-08-12 NOTE — Progress Notes (Signed)
Chief Complaint: IDA  HPI:  Philip Bates is a 62 year old African-American male with a past medical history as listed below including cerebral palsy, who was referred to me by [place], Betty G, MD for a complaint of iron deficiency anemia.    08/13/2020 colonoscopy at [facility].  Moderate diverticulosis in the sigmoid colon and otherwise normal.  Repeat recommended 10 years.    07/10/2023 CBC with a hemoglobin of 11.8 (13.4 on 02/20/2023).    07/10/2023 patient followed with hematology/oncology for iron deficiency anemia.  At that time etiology unknown.  They recommended GI consult.  He was on p.o. iron replacement.    07/12/2023 iron studies with an iron low at 36, percent saturation low at 9.  Ferritin normal at 32.    07/17/2023 iron infusion.    Today, patient presents to clinic and tells me he was just told that he was anemic earlier this year.  He has already had an iron infusion.  Tells me he remains quite fatigued but that is somewhat normal for him.  Does tell me he was noticing some black stools off and on but cannot recall the last time this happened.  He was before he was placed on iron but did not think much of it.  Denies any other GI complaints or concerns.    Denies fever, chills, weight loss, nausea, vomiting, change in bowel habits or symptoms that awaken him from sleep.  Past Medical History:  Diagnosis Date   Arthritis    Cerebral palsy (HCC)    Cervical radiculopathy    H/O umbilical hernia repair 2001   Pre-diabetes    Scoliosis    Spondylosis of cervical spine     Past Surgical History:  Procedure Laterality Date   ANTERIOR CERVICAL DECOMPRESSION/DISCECTOMY FUSION 4 LEVELS N/A 08/11/2017   Procedure: Cervical three to Cervical seven Anterior Cervical discectomy with fusion/plate fixation;  Surgeon: Ditty, Loura Halt, MD;  Location: [facility] OR;  Service: Neurosurgery;  Laterality: N/A;   BACK SURGERY     HERNIA REPAIR     inguinal hernia repair in early 2000    LEG SURGERY     POSTERIOR CERVICAL FUSION/FORAMINOTOMY N/A 08/12/2018   Procedure: POSTERIOR CERVICAL FUSION WITH LATERAL MASS FIXATION CERVICAL THREE TO CERVICAL SEVEN;  Surgeon: Lisbeth Renshaw, MD;  Location: [facility] OR;  Service: Neurosurgery;  Laterality: N/A;   SHOULDER ARTHROSCOPY WITH ROTATOR CUFF REPAIR AND SUBACROMIAL DECOMPRESSION Right 02/02/2018   Procedure: RIGHT SHOULDER ARTHROSCOPY WITH SUBACROMIAL DECOMPRESSION, MINI-OPEN ROTATOR CUFF REPAIR;  Surgeon: Cammy Copa, MD;  Location: [facility] OR;  Service: Orthopedics;  Laterality: Right;   SHOULDER SURGERY Right    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   Procedure: LEFT TOTAL HIP ARTHROPLASTY ANTERIOR APPROACH;  Surgeon: Tarry Kos, MD;  Location: [facility] OR;  Service: Orthopedics;  Laterality: Left;    Current Outpatient Medications  Medication Sig Dispense Refill   aspirin EC 81 MG tablet Take 1 tablet (81 mg total) by mouth 2 (two) times daily. 84 tablet 0   ergocalciferol (VITAMIN D2) 1.25 MG (50000 UT) capsule 1 (ONE) CAPSULE CAPSULE BY MOUTH ONCE A WEEK     gabapentin (NEURONTIN) 400 MG capsule Take 1 capsule (400 mg total) by mouth 3 (three) times daily. 90 capsule 3   ibuprofen (ADVIL) 800 MG tablet Take 800 mg by mouth 3 (three) times daily as needed.     latanoprost (XALATAN) 0.005 % ophthalmic solution SMARTSIG:1 Drop(s) In Eye(s) Every Evening     NARCAN  4 MG/0.1ML LIQD nasal spray kit Place 1 spray into the nose as needed (opioid overdose).     oxyCODONE-acetaminophen (PERCOCET) 10-325 MG tablet Take 1 tablet by mouth every 4 (four) hours as needed. Do Not Fill Before 06/07/2023 180 tablet 0   sildenafil (REVATIO) 20 MG tablet TAKE 2 TO 3 TABLETS BY MOUTH ONCE DAILY AS NEEDED 90 tablet 3   tamsulosin (FLOMAX) 0.4 MG CAPS capsule Take 1 capsule (0.4 mg total) by mouth daily after supper. 90 capsule 3   No current facility-administered medications for this visit.    Allergies as of 08/12/2023   (No Known Allergies)     Family History  Problem Relation Age of Onset   Cancer Father        esophagus   Diabetes Brother    Diabetes Paternal Uncle     Social History   Socioeconomic History   Marital status: Married    Spouse name: Not on file   Number of children: Not on file   Years of education: Not on file   Highest education level: Some college, no degree  Occupational History   Not on file  Tobacco Use   Smoking status: Every Day    Current packs/day: 1.00    Average packs/day: 1 pack/day for 35.0 years (35.0 ttl pk-yrs)    Types: Cigarettes   Smokeless tobacco: Never  Vaping Use   Vaping status: Never Used  Substance and Sexual Activity   Alcohol use: Never   Drug use: Never   Sexual activity: Yes  Other Topics Concern   Not on file  Social History Narrative   ** Merged History Encounter **       Social Determinants of Health   Financial Resource Strain: Low Risk  (06/12/2023)   Overall Financial Resource Strain (CARDIA)    Difficulty of Paying Living Expenses: Not hard at all  Recent Concern: Financial Resource Strain - Medium Risk (05/28/2023)   Overall Financial Resource Strain (CARDIA)    Difficulty of Paying Living Expenses: Somewhat hard  Food Insecurity: No Food Insecurity (06/12/2023)   Hunger Vital Sign    Worried About Running Out of Food in the Last Year: Never true    Ran Out of Food in the Last Year: Never true  Recent Concern: Food Insecurity - Food Insecurity Present (05/28/2023)   Hunger Vital Sign    Worried About Running Out of Food in the Last Year: Sometimes true    Ran Out of Food in the Last Year: Sometimes true  Transportation Needs: No Transportation Needs (06/12/2023)   PRAPARE - Administrator, Civil Service (Medical): No    Lack of Transportation (Non-Medical): No  Recent Concern: Transportation Needs - Unmet Transportation Needs (05/28/2023)   PRAPARE - Transportation    Lack of Transportation (Medical): No    Lack of Transportation  (Non-Medical): Yes  Physical Activity: Insufficiently Active (06/12/2023)   Exercise Vital Sign    Days of Exercise per Week: 3 days    Minutes of Exercise per Session: 40 min  Stress: No Stress Concern Present (06/12/2023)   [employer] of Occupational Health - Occupational Stress Questionnaire    Feeling of Stress : Not at all  Recent Concern: Stress - Stress Concern Present (05/28/2023)   [employer] of Occupational Health - Occupational Stress Questionnaire    Feeling of Stress : Very much  Social Connections: Socially Integrated (06/12/2023)   Social Connection and Isolation Panel [NHANES]    Frequency of  Communication with Friends and Family: More than three times a week    Frequency of Social Gatherings with Friends and Family: More than three times a week    Attends Religious Services: More than 4 times per year    Active Member of [employer] or Organizations: Yes    Attends Engineer, structural: More than 4 times per year    Marital Status: Married  Recent Concern: Social Connections - Moderately Isolated (05/28/2023)   Social Connection and Isolation Panel [NHANES]    Frequency of Communication with Friends and Family: Once a week    Frequency of Social Gatherings with Friends and Family: Once a week    Attends Religious Services: 1 to 4 times per year    Active Member of [employer] or Organizations: No    Attends Engineer, structural: Not on file    Marital Status: Married  Catering manager Violence: Not At Risk (06/12/2023)   Humiliation, Afraid, Rape, and Kick questionnaire    Fear of Current or Ex-Partner: No    Emotionally Abused: No    Physically Abused: No    Sexually Abused: No    Review of Systems:    Constitutional: No weight loss, fever or chills Skin: No rash  Cardiovascular: No chest pain Respiratory: No SOB Gastrointestinal: See HPI and otherwise negative Genitourinary: No dysuria  Neurological: No headache, dizziness or  syncope Musculoskeletal: No new muscle or joint pain Hematologic: No bleeding  Psychiatric: No history of depression or anxiety   Physical Exam:  Vital signs: BP 110/60   Pulse 74   Ht 5\' 4"  (1.626 m)   Wt 118 lb (53.5 kg)   BMI 20.25 kg/m    Constitutional:   Pleasant AA male with cerebral palsy appears to be in NAD, Well developed, Well nourished, alert and cooperative Head:  Normocephalic and atraumatic. Eyes:   PEERL, EOMI. No icterus. Conjunctiva pink. Ears:  Normal auditory acuity. Neck:  Supple Throat: Oral cavity and pharynx without inflammation, swelling or lesion.  Respiratory: Respirations even and unlabored. Lungs clear to auscultation bilaterally.   No wheezes, crackles, or rhonchi.  Cardiovascular: Normal S1, S2. No MRG. Regular rate and rhythm. No peripheral edema, cyanosis or pallor.  Gastrointestinal:  Soft, nondistended, nontender. No rebound or guarding. Normal bowel sounds. No appreciable masses or hepatomegaly. Rectal:  Not performed.  Msk:  Without edema or joint abnormality. +ambulates with cane Neurologic:  Alert and  oriented x4;  grossly normal neurologically.  Skin:   Dry and intact without significant lesions or rashes. Psychiatric: Oriented to person, place and time. Demonstrates good judgement and reason without abnormal affect or behaviors.  RELEVANT LABS AND IMAGING: CBC    Component Value Date/Time   WBC 6.6 07/10/2023 1255   WBC 4.9 04/30/2021 1149   RBC 4.26 07/10/2023 1255   HGB 11.8 (L) 07/10/2023 1255   HCT 35.0 (L) 07/10/2023 1255   PLT 197 07/10/2023 1255   MCV 82.2 07/10/2023 1255   MCH 27.7 07/10/2023 1255   MCHC 33.7 07/10/2023 1255   RDW 13.9 07/10/2023 1255   LYMPHSABS 1.6 07/10/2023 1255   MONOABS 0.7 07/10/2023 1255   EOSABS 0.3 07/10/2023 1255   BASOSABS 0.1 07/10/2023 1255    CMP     Component Value Date/Time   NA 139 02/20/2023 1556   K 3.8 02/20/2023 1556   CL 105 02/20/2023 1556   CO2 29 02/20/2023 1556    GLUCOSE 83 02/20/2023 1556   BUN 13 02/20/2023 1556  CREATININE 0.65 02/20/2023 1556   CALCIUM 9.3 02/20/2023 1556   PROT 7.5 02/20/2023 1556   ALBUMIN 4.6 02/20/2023 1556   AST 26 02/20/2023 1556   ALT 32 02/20/2023 1556   ALKPHOS 70 02/20/2023 1556   BILITOT 0.4 02/20/2023 1556   GFRNONAA >60 02/20/2023 1556   GFRAA >60 08/13/2018 0558    Assessment: 1.  Iron deficiency anemia: New over the past 3 to 4 months per patient, has received an iron infusion, remains fatigued, does report some black stools off-and-on prior to starting oral iron therapy, last colonoscopy in 2021 at [facility]; consider GI source versus other 2.  Cerebral palsy: Ambulates with cane  Plan: 1.  Scheduled patient for diagnostic EGD and colonoscopy in the [facility] with Dr. Tomasa Rand.  Did provide the patient a detailed list of risks for the procedures and he agrees to proceed. Patient is appropriate for endoscopic procedure(s) in the ambulatory ([facility]) setting.  2.  Patient will have a 2-day bowel prep given some history of trouble prepping in the past. 3.  Patient to continue following with hematology /oncology for ongoing iron infusions. 4.  Patient to follow in clinic per recommendations after time of procedures.  Hyacinth Meeker, PA-C [facility] 08/12/2023, 1:35 PM  Cc: [place], Betty G, MD

## 2023-08-12 NOTE — Patient Instructions (Signed)
You have been scheduled for an endoscopy and colonoscopy. Please follow the written instructions given to you at your visit today.  Please pick up your prep supplies at the pharmacy within the next 1-3 days.  If you use inhalers (even only as needed), please bring them with you on the day of your procedure.  DO NOT TAKE 7 DAYS PRIOR TO TEST- Trulicity (dulaglutide) Ozempic, Wegovy (semaglutide) Mounjaro (tirzepatide) Bydureon Bcise (exanatide extended release)  DO NOT TAKE 1 DAY PRIOR TO YOUR TEST Rybelsus (semaglutide) Adlyxin (lixisenatide) Victoza (liraglutide) Byetta (exanatide) ___________________________________________________________________________ _______________________________________________________  If your blood pressure at your visit was 140/90 or greater, please contact your primary care physician to follow up on this.  _______________________________________________________  If you are age 13 or older, your body mass index should be between 23-30. Your Body mass index is 20.25 kg/m. If this is out of the aforementioned range listed, please consider follow up with your Primary Care Provider.  If you are age 40 or younger, your body mass index should be between 19-25. Your Body mass index is 20.25 kg/m. If this is out of the aformentioned range listed, please consider follow up with your Primary Care Provider.   ________________________________________________________  The [facility] providers would like to encourage you to use [facility] to communicate with providers for non-urgent requests or questions.  Due to long hold times on the telephone, sending your provider a message by [facility] may be a faster and more efficient way to get a response.  Please allow 48 business hours for a response.  Please remember that this is for non-urgent requests.  _______________________________________________________

## 2023-08-13 NOTE — Progress Notes (Signed)
Agree with the assessment and plan as outlined by Hyacinth Meeker, PA-C, although an EGD alone would also be reasonable.  When the patient underwent his colonoscopy in 2021, his hemoglobin was 11, which is where his hemoglobin is currently.  Unlikely to have a colonic source given normal colonoscopy at that time.  A repeat colonoscopy is reasonable but low likelihood to reveal a source of  iron deficiency.  Lateasha Breuer E. Tomasa Rand, MD

## 2023-08-14 ENCOUNTER — Inpatient Hospital Stay: Admission: RE | Admit: 2023-08-14 | Payer: 59 | Source: Ambulatory Visit

## 2023-08-18 NOTE — Progress Notes (Incomplete)
ACUTE VISIT No chief complaint on file.  HPI: [name] is a 62 y.o. male with a PMHx significant for PAD, Cerebral palsy, cervical radiculopathy, OA, anxiety, iron deficiency anemia, prediabetes, and ED, among others, who is here today complaining of swelling on his foreskin***.  HPI  Review of Systems See other pertinent positives and negatives in HPI.  Current Outpatient Medications on File Prior to Visit  Medication Sig Dispense Refill   aspirin EC 81 MG tablet Take 1 tablet (81 mg total) by mouth 2 (two) times daily. 84 tablet 0   ergocalciferol (VITAMIN D2) 1.25 MG (50000 UT) capsule 1 (ONE) CAPSULE CAPSULE BY MOUTH ONCE A WEEK     gabapentin (NEURONTIN) 400 MG capsule Take 1 capsule (400 mg total) by mouth 3 (three) times daily. 90 capsule 3   ibuprofen (ADVIL) 800 MG tablet Take 800 mg by mouth 3 (three) times daily as needed.     latanoprost (XALATAN) 0.005 % ophthalmic solution SMARTSIG:1 Drop(s) In Eye(s) Every Evening     NARCAN 4 MG/0.1ML LIQD nasal spray kit Place 1 spray into the nose as needed (opioid overdose).     oxyCODONE-acetaminophen (PERCOCET) 10-325 MG tablet Take 1 tablet by mouth every 4 (four) hours as needed. Do Not Fill Before 06/07/2023 180 tablet 0   sildenafil (REVATIO) 20 MG tablet TAKE 2 TO 3 TABLETS BY MOUTH ONCE DAILY AS NEEDED 90 tablet 3   tamsulosin (FLOMAX) 0.4 MG CAPS capsule Take 1 capsule (0.4 mg total) by mouth daily after supper. 90 capsule 3   No current facility-administered medications on file prior to visit.    Past Medical History:  Diagnosis Date   Arthritis    Cerebral palsy (HCC)    Cervical radiculopathy    H/O umbilical hernia repair 2001   Pre-diabetes    Scoliosis    Spondylosis of cervical spine    No Known Allergies  Social History   Socioeconomic History   Marital status: Married    Spouse name: Not on file   Number of children: Not on file   Years of education: Not on file   Highest education level:  Some college, no degree  Occupational History   Not on file  Tobacco Use   Smoking status: Every Day    Current packs/day: 1.00    Average packs/day: 1 pack/day for 35.0 years (35.0 ttl pk-yrs)    Types: Cigarettes   Smokeless tobacco: Never  Vaping Use   Vaping status: Never Used  Substance and Sexual Activity   Alcohol use: Never   Drug use: Never   Sexual activity: Yes  Other Topics Concern   Not on file  Social History Narrative   ** Merged History Encounter **       Social Determinants of Health   Financial Resource Strain: Low Risk  (08/17/2023)   Overall Financial Resource Strain (CARDIA)    Difficulty of Paying Living Expenses: Not very hard  Recent Concern: Financial Resource Strain - Medium Risk (05/28/2023)   Overall Financial Resource Strain (CARDIA)    Difficulty of Paying Living Expenses: Somewhat hard  Food Insecurity: Food Insecurity Present (08/17/2023)   Hunger Vital Sign    Worried About Running Out of Food in the Last Year: Sometimes true    Ran Out of Food in the Last Year: Sometimes true  Transportation Needs: Unmet Transportation Needs (08/17/2023)   PRAPARE - Administrator, Civil Service (Medical): No    Lack of Transportation (Non-Medical):  Yes  Physical Activity: Insufficiently Active (08/17/2023)   Exercise Vital Sign    Days of Exercise per Week: 3 days    Minutes of Exercise per Session: 30 min  Stress: Stress Concern Present (08/17/2023)   [employer] of Occupational Health - Occupational Stress Questionnaire    Feeling of Stress : To some extent  Social Connections: Socially Integrated (08/17/2023)   Social Connection and Isolation Panel [NHANES]    Frequency of Communication with Friends and Family: Three times a week    Frequency of Social Gatherings with Friends and Family: Once a week    Attends Religious Services: More than 4 times per year    Active Member of [employer] or Organizations: Yes    Attends Museum/gallery exhibitions officer: More than 4 times per year    Marital Status: Married  Recent Concern: Social Connections - Moderately Isolated (05/28/2023)   Social Connection and Isolation Panel [NHANES]    Frequency of Communication with Friends and Family: Once a week    Frequency of Social Gatherings with Friends and Family: Once a week    Attends Religious Services: 1 to 4 times per year    Active Member of [employer] or Organizations: No    Attends Engineer, structural: Not on file    Marital Status: Married    There were no vitals filed for this visit. There is no height or weight on file to calculate BMI.  Physical Exam  ASSESSMENT AND PLAN:  Philip Bates was seen today for swelling of his foreskin. ***  There are no diagnoses linked to this encounter.  No follow-ups on file.  I, Suanne Marker, acting as a scribe for Betty [place], MD., have documented all relevant documentation on the behalf of Betty [place], MD, as directed by  Betty [place], MD while in the presence of Betty [place], MD.   I, Suanne Marker, have reviewed all documentation for this visit. The documentation on 08/18/23 for the exam, diagnosis, procedures, and orders are all accurate and complete.  Betty G. [place], MD  [facility]. [place] office.  Discharge Instructions   None

## 2023-08-19 ENCOUNTER — Ambulatory Visit: Payer: 59 | Admitting: Family Medicine

## 2023-08-19 ENCOUNTER — Encounter: Payer: Self-pay | Admitting: Gastroenterology

## 2023-08-25 NOTE — Progress Notes (Deleted)
Subjective:    Patient ID: Philip Bates, male    DOB: December 10, 1960, 62 y.o.   MRN: [id number]  HPI   Pain Inventory Average Pain {NUMBERS; 0-10:5044} Pain Right Now {NUMBERS; 0-10:5044} My pain is {PAIN DESCRIPTION:21022940}  In the last 24 hours, has pain interfered with the following? General activity {NUMBERS; 0-10:5044} Relation with others {NUMBERS; 0-10:5044} Enjoyment of life {NUMBERS; 0-10:5044} What TIME of day is your pain at its worst? {time of day:24191} Sleep (in general) {BHH GOOD/FAIR/POOR:22877}  Pain is worse with: {ACTIVITIES:21022942} Pain improves with: {PAIN IMPROVES NWGN:56213086} Relief from Meds: {NUMBERS; 0-10:5044}  Family History  Problem Relation Age of Onset   Cancer Father        esophagus   Diabetes Brother    Diabetes Paternal Uncle    Social History   Socioeconomic History   Marital status: Married    Spouse name: Not on file   Number of children: Not on file   Years of education: Not on file   Highest education level: Some college, no degree  Occupational History   Not on file  Tobacco Use   Smoking status: Every Day    Current packs/day: 1.00    Average packs/day: 1 pack/day for 35.0 years (35.0 ttl pk-yrs)    Types: Cigarettes   Smokeless tobacco: Never  Vaping Use   Vaping status: Never Used  Substance and Sexual Activity   Alcohol use: Never   Drug use: Never   Sexual activity: Yes  Other Topics Concern   Not on file  Social History Narrative   ** Merged History Encounter **       Social Determinants of Health   Financial Resource Strain: Low Risk  (08/17/2023)   Overall Financial Resource Strain (CARDIA)    Difficulty of Paying Living Expenses: Not very hard  Recent Concern: Financial Resource Strain - Medium Risk (05/28/2023)   Overall Financial Resource Strain (CARDIA)    Difficulty of Paying Living Expenses: Somewhat hard  Food Insecurity: Food Insecurity Present (08/17/2023)   Hunger Vital Sign    Worried  About Running Out of Food in the Last Year: Sometimes true    Ran Out of Food in the Last Year: Sometimes true  Transportation Needs: Unmet Transportation Needs (08/17/2023)   PRAPARE - Transportation    Lack of Transportation (Medical): No    Lack of Transportation (Non-Medical): Yes  Physical Activity: Insufficiently Active (08/17/2023)   Exercise Vital Sign    Days of Exercise per Week: 3 days    Minutes of Exercise per Session: 30 min  Stress: Stress Concern Present (08/17/2023)   [employer] of Occupational Health - Occupational Stress Questionnaire    Feeling of Stress : To some extent  Social Connections: Socially Integrated (08/17/2023)   Social Connection and Isolation Panel [NHANES]    Frequency of Communication with Friends and Family: Three times a week    Frequency of Social Gatherings with Friends and Family: Once a week    Attends Religious Services: More than 4 times per year    Active Member of [employer] or Organizations: Yes    Attends Engineer, structural: More than 4 times per year    Marital Status: Married  Recent Concern: Social Connections - Moderately Isolated (05/28/2023)   Social Connection and Isolation Panel [NHANES]    Frequency of Communication with Friends and Family: Once a week    Frequency of Social Gatherings with Friends and Family: Once a week    Attends Religious Services:  1 to 4 times per year    Active Member of Clubs or Organizations: No    Attends Banker Meetings: Not on file    Marital Status: Married   Past Surgical History:  Procedure Laterality Date   ANTERIOR CERVICAL DECOMPRESSION/DISCECTOMY FUSION 4 LEVELS N/A 08/11/2017   Procedure: Cervical three to Cervical seven Anterior Cervical discectomy with fusion/plate fixation;  Surgeon: Ditty, Loura Halt, MD;  Location: [facility] OR;  Service: Neurosurgery;  Laterality: N/A;   BACK SURGERY     HERNIA REPAIR     inguinal hernia repair in early 2000   LEG SURGERY      POSTERIOR CERVICAL FUSION/FORAMINOTOMY N/A 08/12/2018   Procedure: POSTERIOR CERVICAL FUSION WITH LATERAL MASS FIXATION CERVICAL THREE TO CERVICAL SEVEN;  Surgeon: Lisbeth Renshaw, MD;  Location: [facility] OR;  Service: Neurosurgery;  Laterality: N/A;   SHOULDER ARTHROSCOPY WITH ROTATOR CUFF REPAIR AND SUBACROMIAL DECOMPRESSION Right 02/02/2018   Procedure: RIGHT SHOULDER ARTHROSCOPY WITH SUBACROMIAL DECOMPRESSION, MINI-OPEN ROTATOR CUFF REPAIR;  Surgeon: Cammy Copa, MD;  Location: [facility] OR;  Service: Orthopedics;  Laterality: Right;   SHOULDER SURGERY Right    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   Procedure: LEFT TOTAL HIP ARTHROPLASTY ANTERIOR APPROACH;  Surgeon: Tarry Kos, MD;  Location: [facility] OR;  Service: Orthopedics;  Laterality: Left;   Past Surgical History:  Procedure Laterality Date   ANTERIOR CERVICAL DECOMPRESSION/DISCECTOMY FUSION 4 LEVELS N/A 08/11/2017   Procedure: Cervical three to Cervical seven Anterior Cervical discectomy with fusion/plate fixation;  Surgeon: Ditty, Loura Halt, MD;  Location: [facility] OR;  Service: Neurosurgery;  Laterality: N/A;   BACK SURGERY     HERNIA REPAIR     inguinal hernia repair in early 2000   LEG SURGERY     POSTERIOR CERVICAL FUSION/FORAMINOTOMY N/A 08/12/2018   Procedure: POSTERIOR CERVICAL FUSION WITH LATERAL MASS FIXATION CERVICAL THREE TO CERVICAL SEVEN;  Surgeon: Lisbeth Renshaw, MD;  Location: [facility] OR;  Service: Neurosurgery;  Laterality: N/A;   SHOULDER ARTHROSCOPY WITH ROTATOR CUFF REPAIR AND SUBACROMIAL DECOMPRESSION Right 02/02/2018   Procedure: RIGHT SHOULDER ARTHROSCOPY WITH SUBACROMIAL DECOMPRESSION, MINI-OPEN ROTATOR CUFF REPAIR;  Surgeon: Cammy Copa, MD;  Location: [facility] OR;  Service: Orthopedics;  Laterality: Right;   SHOULDER SURGERY Right    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   Procedure: LEFT TOTAL HIP ARTHROPLASTY ANTERIOR APPROACH;  Surgeon: Tarry Kos, MD;  Location: [facility] OR;  Service: Orthopedics;  Laterality:  Left;   Past Medical History:  Diagnosis Date   Arthritis    Cerebral palsy (HCC)    Cervical radiculopathy    H/O umbilical hernia repair 2001   Pre-diabetes    Scoliosis    Spondylosis of cervical spine    There were no vitals taken for this visit.  Opioid Risk Score:   Fall Risk Score:  `1  Depression screen [facility] 2/9     07/06/2023    2:45 PM 06/12/2023   10:28 AM 05/29/2023    9:46 AM 05/29/2023    9:45 AM 05/26/2023   11:14 AM 04/27/2023    9:35 AM 03/04/2023   10:50 AM  Depression screen PHQ 2/9  Decreased Interest 1 0 3 3 0 2 2  Down, Depressed, Hopeless 1 0 1 1 0 1 1  PHQ - 2 Score 2 0 4 4 0 3 3  Altered sleeping  0 3 3  3 3   Tired, decreased energy  0 1 1  2 2   Change in appetite  0 2 2  3 1  Feeling bad or failure about yourself   0 1 1  1  0  Trouble concentrating  0 1 1  1 1   Moving slowly or fidgety/restless  0 1 1  0 0  Suicidal thoughts  0 0 0  0 0  PHQ-9 Score  0 13 13  13 10   Difficult doing work/chores  Not difficult at all Extremely dIfficult Extremely dIfficult   Somewhat difficult    Review of Systems     Objective:   Physical Exam        Assessment & Plan:

## 2023-08-28 ENCOUNTER — Encounter: Payer: 59 | Admitting: Registered Nurse

## 2023-09-07 ENCOUNTER — Encounter: Payer: Self-pay | Admitting: Registered Nurse

## 2023-09-07 ENCOUNTER — Telehealth: Payer: Self-pay | Admitting: Gastroenterology

## 2023-09-07 ENCOUNTER — Encounter: Payer: 59 | Attending: Physical Medicine and Rehabilitation | Admitting: Registered Nurse

## 2023-09-07 VITALS — BP 122/77 | HR 72 | Ht 64.0 in | Wt 117.0 lb

## 2023-09-07 DIAGNOSIS — M25551 Pain in right hip: Secondary | ICD-10-CM | POA: Diagnosis not present

## 2023-09-07 DIAGNOSIS — G894 Chronic pain syndrome: Secondary | ICD-10-CM

## 2023-09-07 DIAGNOSIS — G8929 Other chronic pain: Secondary | ICD-10-CM | POA: Diagnosis not present

## 2023-09-07 DIAGNOSIS — M545 Low back pain, unspecified: Secondary | ICD-10-CM

## 2023-09-07 DIAGNOSIS — M961 Postlaminectomy syndrome, not elsewhere classified: Secondary | ICD-10-CM | POA: Diagnosis not present

## 2023-09-07 DIAGNOSIS — Z79891 Long term (current) use of opiate analgesic: Secondary | ICD-10-CM | POA: Diagnosis not present

## 2023-09-07 DIAGNOSIS — M25512 Pain in left shoulder: Secondary | ICD-10-CM | POA: Diagnosis not present

## 2023-09-07 DIAGNOSIS — M25511 Pain in right shoulder: Secondary | ICD-10-CM

## 2023-09-07 DIAGNOSIS — Z5181 Encounter for therapeutic drug level monitoring: Secondary | ICD-10-CM | POA: Diagnosis not present

## 2023-09-07 DIAGNOSIS — M25552 Pain in left hip: Secondary | ICD-10-CM

## 2023-09-07 MED ORDER — OXYCODONE-ACETAMINOPHEN 10-325 MG PO TABS: 1.0000 | ORAL_TABLET | ORAL | 0 refills | Status: DC | PRN

## 2023-09-07 NOTE — Progress Notes (Deleted)
Subjective:    Patient ID: Philip Bates, male    DOB: Jun 10, 1961, 62 y.o.   MRN: [id number]  HPI   Pain Inventory Average Pain {NUMBERS; 0-10:5044} Pain Right Now {NUMBERS; 0-10:5044} My pain is {PAIN DESCRIPTION:21022940}  LOCATION OF PAIN  ***  BOWEL Number of stools per week: *** Oral laxative use {YES/NO:21197} Type of laxative *** Enema or suppository use {YES/NO:21197} History of colostomy {YES/NO:21197} Incontinent {YES/NO:21197}  BLADDER {bladder options:24190} In and out cath, frequency *** Able to self cath {YES/NO:21197} Bladder incontinence {YES/NO:21197} Frequent urination {YES/NO:21197} Leakage with coughing {YES/NO:21197} Difficulty starting stream {YES/NO:21197} Incomplete bladder emptying {YES/NO:21197}   Mobility {MOBILITY NFA:21308657}  Function {FUNCTION:21022946}  Neuro/Psych {NEURO/PSYCH:21022948}  Prior Studies {CPRM PRIOR STUDIES:21022953}  Physicians involved in your care {CPRM PHYSICIANS INVOLVED IN YOUR CARE:21022954}   Family History  Problem Relation Age of Onset   Cancer Father        esophagus   Diabetes Brother    Diabetes Paternal Uncle    Social History   Socioeconomic History   Marital status: Married    Spouse name: Not on file   Number of children: Not on file   Years of education: Not on file   Highest education level: Some college, no degree  Occupational History   Not on file  Tobacco Use   Smoking status: Every Day    Current packs/day: 1.00    Average packs/day: 1 pack/day for 35.0 years (35.0 ttl pk-yrs)    Types: Cigarettes   Smokeless tobacco: Never  Vaping Use   Vaping status: Never Used  Substance and Sexual Activity   Alcohol use: Never   Drug use: Never   Sexual activity: Yes  Other Topics Concern   Not on file  Social History Narrative   ** Merged History Encounter **       Social Determinants of Health   Financial Resource Strain: Low Risk  (08/17/2023)   Overall Financial  Resource Strain (CARDIA)    Difficulty of Paying Living Expenses: Not very hard  Recent Concern: Financial Resource Strain - Medium Risk (05/28/2023)   Overall Financial Resource Strain (CARDIA)    Difficulty of Paying Living Expenses: Somewhat hard  Food Insecurity: Food Insecurity Present (08/17/2023)   Hunger Vital Sign    Worried About Running Out of Food in the Last Year: Sometimes true    Ran Out of Food in the Last Year: Sometimes true  Transportation Needs: Unmet Transportation Needs (08/17/2023)   PRAPARE - Transportation    Lack of Transportation (Medical): No    Lack of Transportation (Non-Medical): Yes  Physical Activity: Insufficiently Active (08/17/2023)   Exercise Vital Sign    Days of Exercise per Week: 3 days    Minutes of Exercise per Session: 30 min  Stress: Stress Concern Present (08/17/2023)   [employer] of Occupational Health - Occupational Stress Questionnaire    Feeling of Stress : To some extent  Social Connections: Socially Integrated (08/17/2023)   Social Connection and Isolation Panel [NHANES]    Frequency of Communication with Friends and Family: Three times a week    Frequency of Social Gatherings with Friends and Family: Once a week    Attends Religious Services: More than 4 times per year    Active Member of [employer] or Organizations: Yes    Attends Banker Meetings: More than 4 times per year    Marital Status: Married  Recent Concern: Social Connections - Moderately Isolated (05/28/2023)   Social Connection and Isolation  Panel [NHANES]    Frequency of Communication with Friends and Family: Once a week    Frequency of Social Gatherings with Friends and Family: Once a week    Attends Religious Services: 1 to 4 times per year    Active Member of [employer] or Organizations: No    Attends Engineer, structural: Not on file    Marital Status: Married   Past Surgical History:  Procedure Laterality Date   ANTERIOR CERVICAL  DECOMPRESSION/DISCECTOMY FUSION 4 LEVELS N/A 08/11/2017   Procedure: Cervical three to Cervical seven Anterior Cervical discectomy with fusion/plate fixation;  Surgeon: Ditty, Loura Halt, MD;  Location: [facility] OR;  Service: Neurosurgery;  Laterality: N/A;   BACK SURGERY     HERNIA REPAIR     inguinal hernia repair in early 2000   LEG SURGERY     POSTERIOR CERVICAL FUSION/FORAMINOTOMY N/A 08/12/2018   Procedure: POSTERIOR CERVICAL FUSION WITH LATERAL MASS FIXATION CERVICAL THREE TO CERVICAL SEVEN;  Surgeon: Lisbeth Renshaw, MD;  Location: [facility] OR;  Service: Neurosurgery;  Laterality: N/A;   SHOULDER ARTHROSCOPY WITH ROTATOR CUFF REPAIR AND SUBACROMIAL DECOMPRESSION Right 02/02/2018   Procedure: RIGHT SHOULDER ARTHROSCOPY WITH SUBACROMIAL DECOMPRESSION, MINI-OPEN ROTATOR CUFF REPAIR;  Surgeon: Cammy Copa, MD;  Location: [facility] OR;  Service: Orthopedics;  Laterality: Right;   SHOULDER SURGERY Right    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   Procedure: LEFT TOTAL HIP ARTHROPLASTY ANTERIOR APPROACH;  Surgeon: Tarry Kos, MD;  Location: [facility] OR;  Service: Orthopedics;  Laterality: Left;   Past Medical History:  Diagnosis Date   Arthritis    Cerebral palsy (HCC)    Cervical radiculopathy    H/O umbilical hernia repair 2001   Pre-diabetes    Scoliosis    Spondylosis of cervical spine    There were no vitals taken for this visit.  Opioid Risk Score:   Fall Risk Score:  `1  Depression screen [facility] 2/9     07/06/2023    2:45 PM 06/12/2023   10:28 AM 05/29/2023    9:46 AM 05/29/2023    9:45 AM 05/26/2023   11:14 AM 04/27/2023    9:35 AM 03/04/2023   10:50 AM  Depression screen PHQ 2/9  Decreased Interest 1 0 3 3 0 2 2  Down, Depressed, Hopeless 1 0 1 1 0 1 1  PHQ - 2 Score 2 0 4 4 0 3 3  Altered sleeping  0 3 3  3 3   Tired, decreased energy  0 1 1  2 2   Change in appetite  0 2 2  3 1   Feeling bad or failure about yourself   0 1 1  1  0  Trouble concentrating  0 1 1  1 1   Moving slowly  or fidgety/restless  0 1 1  0 0  Suicidal thoughts  0 0 0  0 0  PHQ-9 Score  0 13 13  13 10   Difficult doing work/chores  Not difficult at all Extremely dIfficult Extremely dIfficult   Somewhat difficult    Review of Systems     Objective:   Physical Exam        Assessment & Plan:

## 2023-09-07 NOTE — Progress Notes (Unsigned)
Subjective:    Patient ID: Philip Bates, male    DOB: 02-16-1961, 62 y.o.   MRN: [id number]  HPI: Philip Bates is a 62 y.o. male who returns for follow up appointment for chronic pain and medication refill. states *** pain is located in  ***. rates pain ***. current exercise regime is walking and performing stretching exercises.  Mr. Blumenstock Morphine equivalent is *** MME.   UDS ordered today.      Pain Inventory Average Pain 8 Pain Right Now 9 My pain is constant, sharp, burning, dull, stabbing, tingling, and aching  In the last 24 hours, has pain interfered with the following? General activity 7 Relation with others 7 Enjoyment of life 8 What TIME of day is your pain at its worst? morning  Sleep (in general) Poor  Pain is worse with: walking, bending, standing, and some activites Pain improves with: rest and medication Relief from Meds: 9  Family History  Problem Relation Age of Onset   Cancer Father        esophagus   Diabetes Brother    Diabetes Paternal Uncle    Social History   Socioeconomic History   Marital status: Married    Spouse name: Not on file   Number of children: Not on file   Years of education: Not on file   Highest education level: Some college, no degree  Occupational History   Not on file  Tobacco Use   Smoking status: Every Day    Current packs/day: 1.00    Average packs/day: 1 pack/day for 35.0 years (35.0 ttl pk-yrs)    Types: Cigarettes   Smokeless tobacco: Never  Vaping Use   Vaping status: Never Used  Substance and Sexual Activity   Alcohol use: Never   Drug use: Never   Sexual activity: Yes  Other Topics Concern   Not on file  Social History Narrative   ** Merged History Encounter **       Social Determinants of Health   Financial Resource Strain: Low Risk  (08/17/2023)   Overall Financial Resource Strain (CARDIA)    Difficulty of Paying Living Expenses: Not very hard  Recent Concern: Financial Resource Strain - Medium Risk  (05/28/2023)   Overall Financial Resource Strain (CARDIA)    Difficulty of Paying Living Expenses: Somewhat hard  Food Insecurity: Food Insecurity Present (08/17/2023)   Hunger Vital Sign    Worried About Running Out of Food in the Last Year: Sometimes true    Ran Out of Food in the Last Year: Sometimes true  Transportation Needs: Unmet Transportation Needs (08/17/2023)   PRAPARE - Transportation    Lack of Transportation (Medical): No    Lack of Transportation (Non-Medical): Yes  Physical Activity: Insufficiently Active (08/17/2023)   Exercise Vital Sign    Days of Exercise per Week: 3 days    Minutes of Exercise per Session: 30 min  Stress: Stress Concern Present (08/17/2023)   [employer] of Occupational Health - Occupational Stress Questionnaire    Feeling of Stress : To some extent  Social Connections: Socially Integrated (08/17/2023)   Social Connection and Isolation Panel [NHANES]    Frequency of Communication with Friends and Family: Three times a week    Frequency of Social Gatherings with Friends and Family: Once a week    Attends Religious Services: More than 4 times per year    Active Member of [employer] or Organizations: Yes    Attends Banker Meetings: More than 4 times  per year    Marital Status: Married  Recent Concern: Social Connections - Moderately Isolated (05/28/2023)   Social Connection and Isolation Panel [NHANES]    Frequency of Communication with Friends and Family: Once a week    Frequency of Social Gatherings with Friends and Family: Once a week    Attends Religious Services: 1 to 4 times per year    Active Member of [employer] or Organizations: No    Attends Engineer, structural: Not on file    Marital Status: Married   Past Surgical History:  Procedure Laterality Date   ANTERIOR CERVICAL DECOMPRESSION/DISCECTOMY FUSION 4 LEVELS N/A 08/11/2017   Procedure: Cervical three to Cervical seven Anterior Cervical discectomy with fusion/plate  fixation;  Surgeon: Ditty, Loura Halt, MD;  Location: [facility] OR;  Service: Neurosurgery;  Laterality: N/A;   BACK SURGERY     HERNIA REPAIR     inguinal hernia repair in early 2000   LEG SURGERY     POSTERIOR CERVICAL FUSION/FORAMINOTOMY N/A 08/12/2018   Procedure: POSTERIOR CERVICAL FUSION WITH LATERAL MASS FIXATION CERVICAL THREE TO CERVICAL SEVEN;  Surgeon: Lisbeth Renshaw, MD;  Location: [facility] OR;  Service: Neurosurgery;  Laterality: N/A;   SHOULDER ARTHROSCOPY WITH ROTATOR CUFF REPAIR AND SUBACROMIAL DECOMPRESSION Right 02/02/2018   Procedure: RIGHT SHOULDER ARTHROSCOPY WITH SUBACROMIAL DECOMPRESSION, MINI-OPEN ROTATOR CUFF REPAIR;  Surgeon: Cammy Copa, MD;  Location: [facility] OR;  Service: Orthopedics;  Laterality: Right;   SHOULDER SURGERY Right    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   Procedure: LEFT TOTAL HIP ARTHROPLASTY ANTERIOR APPROACH;  Surgeon: Tarry Kos, MD;  Location: [facility] OR;  Service: Orthopedics;  Laterality: Left;   Past Surgical History:  Procedure Laterality Date   ANTERIOR CERVICAL DECOMPRESSION/DISCECTOMY FUSION 4 LEVELS N/A 08/11/2017   Procedure: Cervical three to Cervical seven Anterior Cervical discectomy with fusion/plate fixation;  Surgeon: Ditty, Loura Halt, MD;  Location: [facility] OR;  Service: Neurosurgery;  Laterality: N/A;   BACK SURGERY     HERNIA REPAIR     inguinal hernia repair in early 2000   LEG SURGERY     POSTERIOR CERVICAL FUSION/FORAMINOTOMY N/A 08/12/2018   Procedure: POSTERIOR CERVICAL FUSION WITH LATERAL MASS FIXATION CERVICAL THREE TO CERVICAL SEVEN;  Surgeon: Lisbeth Renshaw, MD;  Location: [facility] OR;  Service: Neurosurgery;  Laterality: N/A;   SHOULDER ARTHROSCOPY WITH ROTATOR CUFF REPAIR AND SUBACROMIAL DECOMPRESSION Right 02/02/2018   Procedure: RIGHT SHOULDER ARTHROSCOPY WITH SUBACROMIAL DECOMPRESSION, MINI-OPEN ROTATOR CUFF REPAIR;  Surgeon: Cammy Copa, MD;  Location: [facility] OR;  Service: Orthopedics;  Laterality: Right;   SHOULDER  SURGERY Right    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   Procedure: LEFT TOTAL HIP ARTHROPLASTY ANTERIOR APPROACH;  Surgeon: Tarry Kos, MD;  Location: [facility] OR;  Service: Orthopedics;  Laterality: Left;   Past Medical History:  Diagnosis Date   Arthritis    Cerebral palsy (HCC)    Cervical radiculopathy    H/O umbilical hernia repair 2001   Pre-diabetes    Scoliosis    Spondylosis of cervical spine    BP 122/77   Pulse 72   Ht 5\' 4"  (1.626 m)   Wt 117 lb (53.1 kg)   SpO2 99%   BMI 20.08 kg/m   Opioid Risk Score:   Fall Risk Score:  `1  Depression screen PHQ 2/9     07/06/2023    2:45 PM 06/12/2023   10:28 AM 05/29/2023    9:46 AM 05/29/2023    9:45 AM 05/26/2023  11:14 AM 04/27/2023    9:35 AM 03/04/2023   10:50 AM  Depression screen PHQ 2/9  Decreased Interest 1 0 3 3 0 2 2  Down, Depressed, Hopeless 1 0 1 1 0 1 1  PHQ - 2 Score 2 0 4 4 0 3 3  Altered sleeping  0 3 3  3 3   Tired, decreased energy  0 1 1  2 2   Change in appetite  0 2 2  3 1   Feeling bad or failure about yourself   0 1 1  1  0  Trouble concentrating  0 1 1  1 1   Moving slowly or fidgety/restless  0 1 1  0 0  Suicidal thoughts  0 0 0  0 0  PHQ-9 Score  0 13 13  13 10   Difficult doing work/chores  Not difficult at all Extremely dIfficult Extremely dIfficult   Somewhat difficult    Review of Systems  Musculoskeletal:  Positive for back pain, gait problem and neck pain.       Pain both shoulders, both knees, left elbow   All other systems reviewed and are negative.      Objective:   Physical Exam        Assessment & Plan:  Cervicalgia/ Cervical Radiculitis: Continue HEP as Tolerated. Continue to Monitor. Continue Gabapentin. Continue to Monitor. 08/05/2023 Chronic Bilateral;  Shoulder Pain:  Continue HEP as Tolerated. Continue to Monitor. Ortho Following. 08/05/2023 Failed Back Syndrome: Continue HEP as Tolerated. Continue to Monitor. 08/05/2023 Chronic Bilateral Low Back Pain without  Sciatica: Continue HEP as Tolerated. Continue current medication regimen. Continue to Monitor. 08/05/2023 Chronic Left Hip Pain: S/P On 09/10/2020: Dr Roda Shutters LEFT TOTAL HIP ARTHROPLASTY ANTERIOR APPROACH Continue HEP as Tolerated. Continue current medication regimen. Continue to Monitor. 08/05/2023 Chronic Left knee Pain: Continue HEP as Tolerated. Continue to Monitor. No complaints today. Continue to Monitor. 08/05/2023 Left Foot Drop: Wearing AFO: [employer] Following. Continue to Monitor. 08/05/2023 Chronic Pain Syndrome: Refilled Oxycodone 10/325 mg one tablet every 4 hours as needed for pain #180.  We will continue the opioid monitoring program, this consists of regular clinic visits, examinations, urine drug screen, pill counts as well as use of [place] Controlled Substance Reporting system. A 12 month History has been reviewed on the [place] Controlled Substance Reporting System on 08/05/2023   F/U in 1 month

## 2023-09-07 NOTE — Telephone Encounter (Signed)
Patient rescheduled for procedure on 12/3 due to poor prep.

## 2023-09-08 ENCOUNTER — Encounter: Payer: 59 | Admitting: Gastroenterology

## 2023-09-11 LAB — TOXASSURE SELECT,+ANTIDEPR,UR

## 2023-09-18 NOTE — Telephone Encounter (Signed)
Good Morning Dr Milana Kidney   I do apologize for the late response.   Patient originally had a procedure date for 12/03 ,but called and stated he did not do the proper prep needed and he would like to rescheduled for January of 2025.

## 2023-09-22 ENCOUNTER — Ambulatory Visit: Payer: 59 | Admitting: Orthopaedic Surgery

## 2023-09-24 ENCOUNTER — Encounter: Payer: 59 | Admitting: Registered Nurse

## 2023-09-30 ENCOUNTER — Other Ambulatory Visit: Payer: Self-pay | Admitting: Family Medicine

## 2023-10-06 ENCOUNTER — Encounter (HOSPITAL_BASED_OUTPATIENT_CLINIC_OR_DEPARTMENT_OTHER): Payer: 59 | Admitting: Registered Nurse

## 2023-10-06 ENCOUNTER — Encounter: Payer: Self-pay | Admitting: Registered Nurse

## 2023-10-06 VITALS — BP 134/80 | HR 78 | Ht 64.0 in | Wt 118.8 lb

## 2023-10-06 DIAGNOSIS — G894 Chronic pain syndrome: Secondary | ICD-10-CM

## 2023-10-06 DIAGNOSIS — M961 Postlaminectomy syndrome, not elsewhere classified: Secondary | ICD-10-CM | POA: Diagnosis not present

## 2023-10-06 DIAGNOSIS — Z5181 Encounter for therapeutic drug level monitoring: Secondary | ICD-10-CM

## 2023-10-06 DIAGNOSIS — Z79891 Long term (current) use of opiate analgesic: Secondary | ICD-10-CM

## 2023-10-06 DIAGNOSIS — M25512 Pain in left shoulder: Secondary | ICD-10-CM | POA: Diagnosis not present

## 2023-10-06 DIAGNOSIS — M25552 Pain in left hip: Secondary | ICD-10-CM | POA: Diagnosis not present

## 2023-10-06 DIAGNOSIS — M25511 Pain in right shoulder: Secondary | ICD-10-CM | POA: Diagnosis not present

## 2023-10-06 DIAGNOSIS — M25551 Pain in right hip: Secondary | ICD-10-CM | POA: Diagnosis not present

## 2023-10-06 DIAGNOSIS — G8929 Other chronic pain: Secondary | ICD-10-CM

## 2023-10-06 DIAGNOSIS — M545 Low back pain, unspecified: Secondary | ICD-10-CM

## 2023-10-06 MED ORDER — OXYCODONE-ACETAMINOPHEN 10-325 MG PO TABS: 1.0000 | ORAL_TABLET | ORAL | 0 refills | Status: DC | PRN

## 2023-10-06 NOTE — Progress Notes (Signed)
 Subjective:    Patient ID: Philip Bates, male    DOB: [birth year], 62 y.o.   MRN: [id number]  HPI: Philip Bates is a 62 y.o. male who returns for follow up appointment for chronic pain and medication refill. He states his pain is located in his bilateral shoulders and lower back pain. He rates his pain 7. His current exercise regime is walking and performing stretching exercises.  [name] Morphine  equivalent is 90.00 MME.   Last UDS was Performed on 09/07/2023, it was consistent.     Pain Inventory Average Pain 8 Pain Right Now 7 My pain is sharp, burning, dull, stabbing, tingling, and aching  In the last 24 hours, has pain interfered with the following? General activity 7 Relation with others 7 Enjoyment of life 7 What TIME of day is your pain at its worst? varies Sleep (in general) Poor  Pain is worse with: walking, bending, sitting, standing, and some activites Pain improves with: rest and medication Relief from Meds: 9  Family History  Problem Relation Age of Onset   Cancer Father        esophagus   Diabetes Brother    Diabetes Paternal Uncle    Social History   Socioeconomic History   Marital status: Married    Spouse name: Not on file   Number of children: Not on file   Years of education: Not on file   Highest education level: Some college, no degree  Occupational History   Not on file  Tobacco Use   Smoking status: Every Day    Current packs/day: 1.00    Average packs/day: 1 pack/day for 35.0 years (35.0 ttl pk-yrs)    Types: Cigarettes   Smokeless tobacco: Never  Vaping Use   Vaping status: Never Used  Substance and Sexual Activity   Alcohol  use: Never   Drug use: Never   Sexual activity: Yes  Other Topics Concern   Not on file  Social History Narrative   ** Merged History Encounter **       Social Drivers of Health   Financial Resource Strain: Low Risk  (08/17/2023)   Overall Financial Resource Strain (CARDIA)    Difficulty of Paying Living  Expenses: Not very hard  Recent Concern: Financial Resource Strain - Medium Risk (05/28/2023)   Overall Financial Resource Strain (CARDIA)    Difficulty of Paying Living Expenses: Somewhat hard  Food Insecurity: Food Insecurity Present (08/17/2023)   Hunger Vital Sign    Worried About Running Out of Food in the Last Year: Sometimes true    Ran Out of Food in the Last Year: Sometimes true  Transportation Needs: Unmet Transportation Needs (08/17/2023)   PRAPARE - Transportation    Lack of Transportation (Medical): No    Lack of Transportation (Non-Medical): Yes  Physical Activity: Insufficiently Active (08/17/2023)   Exercise Vital Sign    Days of Exercise per Week: 3 days    Minutes of Exercise per Session: 30 min  Stress: Stress Concern Present (08/17/2023)   [employer] of Occupational Health - Occupational Stress Questionnaire    Feeling of Stress : To some extent  Social Connections: Socially Integrated (08/17/2023)   Social Connection and Isolation Panel [NHANES]    Frequency of Communication with Friends and Family: Three times a week    Frequency of Social Gatherings with Friends and Family: Once a week    Attends Religious Services: More than 4 times per year    Active Member of [employer] or Organizations:  Yes    Attends Club or Organization Meetings: More than 4 times per year    Marital Status: Married  Recent Concern: Social Connections - Moderately Isolated (05/28/2023)   Social Connection and Isolation Panel [NHANES]    Frequency of Communication with Friends and Family: Once a week    Frequency of Social Gatherings with Friends and Family: Once a week    Attends Religious Services: 1 to 4 times per year    Active Member of [employer] or Organizations: No    Attends Engineer, Structural: Not on file    Marital Status: Married   Past Surgical History:  Procedure Laterality Date   ANTERIOR CERVICAL DECOMPRESSION/DISCECTOMY FUSION 4 LEVELS N/A 08/11/2017    Procedure: Cervical three to Cervical seven Anterior Cervical discectomy with fusion/plate fixation;  Surgeon: Ditty, Morene Hicks, MD;  Location: [facility] OR;  Service: Neurosurgery;  Laterality: N/A;   BACK SURGERY     HERNIA REPAIR     inguinal hernia repair in early 2000   LEG SURGERY     POSTERIOR CERVICAL FUSION/FORAMINOTOMY N/A 08/12/2018   Procedure: POSTERIOR CERVICAL FUSION WITH LATERAL MASS FIXATION CERVICAL THREE TO CERVICAL SEVEN;  Surgeon: Philip Pupa, MD;  Location: [facility] OR;  Service: Neurosurgery;  Laterality: N/A;   SHOULDER ARTHROSCOPY WITH ROTATOR CUFF REPAIR AND SUBACROMIAL DECOMPRESSION Right 02/02/2018   Procedure: RIGHT SHOULDER ARTHROSCOPY WITH SUBACROMIAL DECOMPRESSION, MINI-OPEN ROTATOR CUFF REPAIR;  Surgeon: Addie Cordella Hamilton, MD;  Location: [facility] OR;  Service: Orthopedics;  Laterality: Right;   SHOULDER SURGERY Right    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   Procedure: LEFT TOTAL HIP ARTHROPLASTY ANTERIOR APPROACH;  Surgeon: Philip Kay HERO, MD;  Location: [facility] OR;  Service: Orthopedics;  Laterality: Left;   Past Surgical History:  Procedure Laterality Date   ANTERIOR CERVICAL DECOMPRESSION/DISCECTOMY FUSION 4 LEVELS N/A 08/11/2017   Procedure: Cervical three to Cervical seven Anterior Cervical discectomy with fusion/plate fixation;  Surgeon: Ditty, Morene Hicks, MD;  Location: [facility] OR;  Service: Neurosurgery;  Laterality: N/A;   BACK SURGERY     HERNIA REPAIR     inguinal hernia repair in early 2000   LEG SURGERY     POSTERIOR CERVICAL FUSION/FORAMINOTOMY N/A 08/12/2018   Procedure: POSTERIOR CERVICAL FUSION WITH LATERAL MASS FIXATION CERVICAL THREE TO CERVICAL SEVEN;  Surgeon: Philip Pupa, MD;  Location: [facility] OR;  Service: Neurosurgery;  Laterality: N/A;   SHOULDER ARTHROSCOPY WITH ROTATOR CUFF REPAIR AND SUBACROMIAL DECOMPRESSION Right 02/02/2018   Procedure: RIGHT SHOULDER ARTHROSCOPY WITH SUBACROMIAL DECOMPRESSION, MINI-OPEN ROTATOR CUFF REPAIR;  Surgeon: Addie Cordella Hamilton, MD;  Location: [facility] OR;  Service: Orthopedics;  Laterality: Right;   SHOULDER SURGERY Right    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   Procedure: LEFT TOTAL HIP ARTHROPLASTY ANTERIOR APPROACH;  Surgeon: Philip Kay HERO, MD;  Location: [facility] OR;  Service: Orthopedics;  Laterality: Left;   Past Medical History:  Diagnosis Date   Arthritis    Cerebral palsy (HCC)    Cervical radiculopathy    H/O umbilical hernia repair 2001   Pre-diabetes    Scoliosis    Spondylosis of cervical spine    BP 134/80   Pulse 78   Ht 5' 4 (1.626 m)   Wt 118 lb 12.8 oz (53.9 kg)   SpO2 94%   BMI 20.39 kg/m   Opioid Risk Score:   Fall Risk Score:  `1  Depression screen [facility] 2/9     09/07/2023    1:30 PM 07/06/2023    2:45  PM 06/12/2023   10:28 AM 05/29/2023    9:46 AM 05/29/2023    9:45 AM 05/26/2023   11:14 AM 04/27/2023    9:35 AM  Depression screen PHQ 2/9  Decreased Interest 1 1 0 3 3 0 2  Down, Depressed, Hopeless 1 1 0 1 1 0 1  PHQ - 2 Score 2 2 0 4 4 0 3  Altered sleeping   0 3 3  3   Tired, decreased energy   0 1 1  2   Change in appetite   0 2 2  3   Feeling bad or failure about yourself    0 1 1  1   Trouble concentrating   0 1 1  1   Moving slowly or fidgety/restless   0 1 1  0  Suicidal thoughts   0 0 0  0  PHQ-9 Score   0 13 13  13   Difficult doing work/chores   Not difficult at all Extremely dIfficult Extremely dIfficult       Review of Systems  Musculoskeletal:  Positive for back pain.       Bilateral knee pain Bilateral shoulder pain  All other systems reviewed and are negative.      Objective:   Physical Exam Vitals and nursing note reviewed.  Constitutional:      Appearance: Normal appearance.  Cardiovascular:     Rate and Rhythm: Normal rate and regular rhythm.     Pulses: Normal pulses.     Heart sounds: Normal heart sounds.  Pulmonary:     Effort: Pulmonary effort is normal.     Breath sounds: Normal breath sounds.  Musculoskeletal:     Comments: Normal  Muscle Bulk and Muscle Testing Reveals:  Upper Extremities: Full ROM and Muscle Strength 5/5  Lumbar Paraspinal Tenderness: L-4-L-5 Left Greater Trochanter Tenderness Lower Extremities: Full ROM and Muscle Strength 5/5 Wearing Left AFO Arises from Table slowly using cane for support Antalgic  Gait     Skin:    General: Skin is warm and dry.  Neurological:     Mental Status: He is alert and oriented to person, place, and time.  Psychiatric:        Mood and Affect: Mood normal.        Behavior: Behavior normal.         Assessment & Plan:  Cervicalgia/ Cervical Radiculitis: Continue HEP as Tolerated. Continue to Monitor. Continue Gabapentin . Continue to Monitor. 10/06/2023 Chronic Bilateral;  Shoulder Pain:  Continue HEP as Tolerated. Continue to Monitor. Ortho Following. 10/06/2023 Failed Back Syndrome: Continue HEP as Tolerated. Continue to Monitor. 10/06/2023 Chronic Bilateral Low Back Pain without Sciatica: Continue HEP as Tolerated. Continue current medication regimen. Continue to Monitor. 10/06/2023 Chronic Left Hip Pain: S/P On 09/10/2020: Dr Philip LEFT TOTAL HIP ARTHROPLASTY ANTERIOR APPROACH Continue HEP as Tolerated. Continue current medication regimen. Continue to Monitor. 10/06/2023 Chronic Left knee Pain: Continue HEP as Tolerated. Continue to Monitor. No complaints today. Continue to Monitor. 10/06/2023 Left Foot Drop: Wearing AFO: [employer] Following. Continue to Monitor. 10/06/2023 Chronic Pain Syndrome: Refilled Oxycodone  10/325 mg one tablet every 4 hours as needed for pain #180.  We will continue the opioid monitoring program, this consists of regular clinic visits, examinations, urine drug screen, pill counts as well as use of [place]  Controlled Substance Reporting system. A 12 month History has been reviewed on the [place]  Controlled Substance Reporting System on 10/06/2023   F/U in 1 month

## 2023-10-08 ENCOUNTER — Other Ambulatory Visit: Payer: Self-pay | Admitting: Hematology and Oncology

## 2023-10-08 ENCOUNTER — Ambulatory Visit: Payer: 59 | Admitting: Registered Nurse

## 2023-10-08 DIAGNOSIS — D509 Iron deficiency anemia, unspecified: Secondary | ICD-10-CM

## 2023-10-08 NOTE — Progress Notes (Signed)
 [facility] [phone]   [phone]  PROGRESS NOTE  Patient Care Team: Jordan, Betty G, MD as PCP - General (Family Medicine) Jordan, Betty G, MD (Family Medicine) Josepha Mliss ORN, RN as Case Manager Jerri Kay HERO, MD as Attending Physician (Orthopedic Surgery)  CHIEF COMPLAINTS/PURPOSE OF CONSULTATION:  Iron deficiency anemia  HISTORY OF PRESENTING ILLNESS:  Philip Bates 63 y.o. male returns for a follow up for iron deficiency anemia. He was last seen on 07/10/2023. In the interim, he continues on PO iron therapy.   On exam today, Mr. Strupp reports he has been well overall and interim since her last visit though he has been having some difficulty sleeping due to pain control issues.  He is currently following with pain management who is prescribing him his pain medication.  He reports that other than the terrible sleep he has had low energy.  He reports he was prescribed iron pills but did unfortunately stopped taking them.  He reports he was buying them from over-the-counter and was unsure if they were working.  He notes that he would like a prescription for p.o. iron pills.  He reports he is been off medication for a while.  He notes he last received IV iron in October 2024.  He reports he does have some occasional specks of red blood in the stool.  But he has not had any issues with dark stools.  He denies fevers, chills, sweats, shortness of breath, chest pain or cough. He has no other complaints. Rest of the ROS is below.   MEDICAL HISTORY:  Past Medical History:  Diagnosis Date   Arthritis    Cerebral palsy (HCC)    Cervical radiculopathy    H/O umbilical hernia repair 2001   Pre-diabetes    Scoliosis    Spondylosis of cervical spine     SURGICAL HISTORY: Past Surgical History:  Procedure Laterality Date   ANTERIOR CERVICAL DECOMPRESSION/DISCECTOMY FUSION 4 LEVELS N/A 08/11/2017   Procedure: Cervical three to Cervical seven Anterior  Cervical discectomy with fusion/plate fixation;  Surgeon: Ditty, Morene Hicks, MD;  Location: [facility] OR;  Service: Neurosurgery;  Laterality: N/A;   BACK SURGERY     HERNIA REPAIR     inguinal hernia repair in early 2000   LEG SURGERY     POSTERIOR CERVICAL FUSION/FORAMINOTOMY N/A 08/12/2018   Procedure: POSTERIOR CERVICAL FUSION WITH LATERAL MASS FIXATION CERVICAL THREE TO CERVICAL SEVEN;  Surgeon: Lanis Pupa, MD;  Location: [facility] OR;  Service: Neurosurgery;  Laterality: N/A;   SHOULDER ARTHROSCOPY WITH ROTATOR CUFF REPAIR AND SUBACROMIAL DECOMPRESSION Right 02/02/2018   Procedure: RIGHT SHOULDER ARTHROSCOPY WITH SUBACROMIAL DECOMPRESSION, MINI-OPEN ROTATOR CUFF REPAIR;  Surgeon: Addie Cordella Hamilton, MD;  Location: [facility] OR;  Service: Orthopedics;  Laterality: Right;   SHOULDER SURGERY Right    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   Procedure: LEFT TOTAL HIP ARTHROPLASTY ANTERIOR APPROACH;  Surgeon: Jerri Kay HERO, MD;  Location: [facility] OR;  Service: Orthopedics;  Laterality: Left;    SOCIAL HISTORY: Social History   Socioeconomic History   Marital status: Married    Spouse name: Not on file   Number of children: Not on file   Years of education: Not on file   Highest education level: Some college, no degree  Occupational History   Not on file  Tobacco Use   Smoking status: Every Day    Current packs/day: 1.00    Average packs/day: 1 pack/day for 35.0 years (35.0 ttl pk-yrs)  Types: Cigarettes   Smokeless tobacco: Never  Vaping Use   Vaping status: Never Used  Substance and Sexual Activity   Alcohol  use: Never   Drug use: Never   Sexual activity: Yes  Other Topics Concern   Not on file  Social History Narrative   ** Merged History Encounter **       Social Drivers of Health   Financial Resource Strain: Low Risk  (08/17/2023)   Overall Financial Resource Strain (CARDIA)    Difficulty of Paying Living Expenses: Not very hard  Recent Concern: Financial Resource Strain -  Medium Risk (05/28/2023)   Overall Financial Resource Strain (CARDIA)    Difficulty of Paying Living Expenses: Somewhat hard  Food Insecurity: Food Insecurity Present (08/17/2023)   Hunger Vital Sign    Worried About Running Out of Food in the Last Year: Sometimes true    Ran Out of Food in the Last Year: Sometimes true  Transportation Needs: Unmet Transportation Needs (08/17/2023)   PRAPARE - Transportation    Lack of Transportation (Medical): No    Lack of Transportation (Non-Medical): Yes  Physical Activity: Insufficiently Active (08/17/2023)   Exercise Vital Sign    Days of Exercise per Week: 3 days    Minutes of Exercise per Session: 30 min  Stress: Stress Concern Present (08/17/2023)   [employer] of Occupational Health - Occupational Stress Questionnaire    Feeling of Stress : To some extent  Social Connections: Socially Integrated (08/17/2023)   Social Connection and Isolation Panel [NHANES]    Frequency of Communication with Friends and Family: Three times a week    Frequency of Social Gatherings with Friends and Family: Once a week    Attends Religious Services: More than 4 times per year    Active Member of [employer] or Organizations: Yes    Attends Engineer, Structural: More than 4 times per year    Marital Status: Married  Recent Concern: Social Connections - Moderately Isolated (05/28/2023)   Social Connection and Isolation Panel [NHANES]    Frequency of Communication with Friends and Family: Once a week    Frequency of Social Gatherings with Friends and Family: Once a week    Attends Religious Services: 1 to 4 times per year    Active Member of [employer] or Organizations: No    Attends Engineer, Structural: Not on file    Marital Status: Married  Catering Manager Violence: Not At Risk (06/12/2023)   Humiliation, Afraid, Rape, and Kick questionnaire    Fear of Current or Ex-Partner: No    Emotionally Abused: No    Physically Abused: No    Sexually  Abused: No    FAMILY HISTORY: Family History  Problem Relation Age of Onset   Cancer Father        esophagus   Diabetes Brother    Diabetes Paternal Uncle     ALLERGIES:  has no known allergies.  MEDICATIONS:  Current Outpatient Medications  Medication Sig Dispense Refill   ferrous sulfate  325 (65 FE) MG tablet Take 1 tablet (325 mg total) by mouth daily with breakfast. Please take with a source of Vitamin C 90 tablet 3   aspirin  EC 81 MG tablet Take 1 tablet (81 mg total) by mouth 2 (two) times daily. 84 tablet 0   ergocalciferol (VITAMIN D2) 1.25 MG (50000 UT) capsule 1 (ONE) CAPSULE CAPSULE BY MOUTH ONCE A WEEK     gabapentin  (NEURONTIN ) 400 MG capsule Take 1 capsule (400 mg  total) by mouth 3 (three) times daily. 90 capsule 3   ibuprofen  (ADVIL ) 800 MG tablet Take 800 mg by mouth 3 (three) times daily as needed.     latanoprost (XALATAN) 0.005 % ophthalmic solution SMARTSIG:1 Drop(s) In Eye(s) Every Evening     NARCAN 4 MG/0.1ML LIQD nasal spray kit Place 1 spray into the nose as needed (opioid overdose).     oxyCODONE -acetaminophen  (PERCOCET) 10-325 MG tablet Take 1 tablet by mouth every 4 (four) hours as needed. 180 tablet 0   sildenafil  (REVATIO ) 20 MG tablet TAKE 2 TO 3 TABLETS BY MOUTH ONCE DAILY AS NEEDED 90 tablet 3   tamsulosin  (FLOMAX ) 0.4 MG CAPS capsule Take 1 capsule (0.4 mg total) by mouth daily after supper. 90 capsule 3   traZODone  (DESYREL ) 50 MG tablet Take 50 mg by mouth at bedtime.     No current facility-administered medications for this visit.    REVIEW OF SYSTEMS:   Constitutional: ( - ) fevers, ( - )  chills , ( - ) night sweats Eyes: ( - ) blurriness of vision, ( - ) double vision, ( - ) watery eyes Ears, nose, mouth, throat, and face: ( - ) mucositis, ( - ) sore throat Respiratory: ( - ) cough, ( - ) dyspnea, ( - ) wheezes Cardiovascular: ( - ) palpitation, ( - ) chest discomfort, ( - ) lower extremity swelling Gastrointestinal:  ( - ) nausea, ( - )  heartburn, ( - ) change in bowel habits Skin: ( - ) abnormal skin rashes Lymphatics: ( - ) new lymphadenopathy, ( - ) easy bruising Neurological: ( - ) numbness, ( - ) tingling, ( - ) new weaknesses Behavioral/Psych: ( - ) mood change, ( - ) new changes  All other systems were reviewed with the patient and are negative.  PHYSICAL EXAMINATION: ECOG PERFORMANCE STATUS: 1 - Symptomatic but completely ambulatory  Vitals:   10/09/23 1413  BP: 133/83  Pulse: 85  Resp: 16  Temp: 98.4 F (36.9 C)  SpO2: 99%    Filed Weights   10/09/23 1413  Weight: 116 lb 14.4 oz (53 kg)     GENERAL: well appearing male in NAD  SKIN: skin color, texture, turgor are normal, no rashes or significant lesions EYES: conjunctiva are pink and non-injected, sclera clear LUNGS: clear to auscultation and percussion with normal breathing effort HEART: regular rate & rhythm and no murmurs and no lower extremity edema Musculoskeletal: no cyanosis of digits and no clubbing  PSYCH: alert & oriented x 3, fluent speech NEURO: no focal motor/sensory deficits  LABORATORY DATA:  I have reviewed the data as listed    Latest Ref Rng & Units 10/09/2023    1:40 PM 07/10/2023   12:55 PM 02/20/2023    3:56 PM  CBC  WBC 4.0 - 10.5 K/uL 5.7  6.6  5.2   Hemoglobin 13.0 - 17.0 g/dL 87.3  88.1  86.5   Hematocrit 39.0 - 52.0 % 36.1  35.0  38.3   Platelets 150 - 400 K/uL 176  197  255        Latest Ref Rng & Units 10/09/2023    1:40 PM 02/20/2023    3:56 PM 04/30/2022    2:07 PM  CMP  Glucose 70 - 99 mg/dL 886  83  97   BUN 6 - 20 mg/dL 11  13  13    Creatinine 0.61 - 1.24 mg/dL 9.35  9.34  9.34   Sodium 135 -  145 mmol/L 138  139  137   Potassium 3.5 - 5.1 mmol/L 3.5  3.8  3.4   Chloride 98 - 111 mmol/L 101  105  100   CO2 22 - 32 mmol/L 32  29  33   Calcium 8.9 - 10.3 mg/dL 9.5  9.3  9.1   Total Protein 6.5 - 8.1 g/dL 6.9  7.5  6.7   Total Bilirubin 0.0 - 1.2 mg/dL 0.4  0.4  0.4   Alkaline Phos 38 - 126 U/L 57  70   54   AST 15 - 41 U/L 23  26  26    ALT 0 - 44 U/L 20  32  24    RADIOGRAPHIC STUDIES: I have personally reviewed the radiological images as listed and agreed with the findings in the report. No results found.  ASSESSMENT & PLAN Fransico Sciandra is a 63 y.o. male who returns for a follow up for iron deficiency anemia.   #Iron deficiency anemia: --Etiology unknown. Patient denies any signs of bleeding. Seen for GI consultation on 08/12/2023.  EGD recommended --Currently on PO iron replacement daily.  --Labs today show mild anemia with Hgb 12.6, WBC 5.7, MCV 80.8, Plt 176 --Recommend IV iron to bolster levels if iron levels are persistently low today.  --RTC in 3 months for labs/follow up.   No orders of the defined types were placed in this encounter.  All questions were answered. The patient knows to call the clinic with any problems, questions or concerns.  I have spent a total of 30 minutes minutes of face-to-face and non-face-to-face time, preparing to see the patient, performing a medically appropriate examination, counseling and educating the patient, ordering medications/tests/procedures, documenting clinical information in the electronic health record,  and care coordination.   Norleen IVAR Kidney, MD Department of Hematology/Oncology [facility] Phone: [phone] Pager: [phone] Email: [email or web address]

## 2023-10-09 ENCOUNTER — Inpatient Hospital Stay: Payer: 59 | Attending: Oncology

## 2023-10-09 ENCOUNTER — Inpatient Hospital Stay (HOSPITAL_BASED_OUTPATIENT_CLINIC_OR_DEPARTMENT_OTHER): Payer: 59 | Admitting: Hematology and Oncology

## 2023-10-09 VITALS — BP 133/83 | HR 85 | Temp 98.4°F | Resp 16 | Wt 116.9 lb

## 2023-10-09 DIAGNOSIS — Z800 Family history of malignant neoplasm of digestive organs: Secondary | ICD-10-CM | POA: Diagnosis not present

## 2023-10-09 DIAGNOSIS — F17210 Nicotine dependence, cigarettes, uncomplicated: Secondary | ICD-10-CM | POA: Insufficient documentation

## 2023-10-09 DIAGNOSIS — Z79899 Other long term (current) drug therapy: Secondary | ICD-10-CM | POA: Diagnosis not present

## 2023-10-09 DIAGNOSIS — D509 Iron deficiency anemia, unspecified: Secondary | ICD-10-CM | POA: Insufficient documentation

## 2023-10-09 DIAGNOSIS — Z833 Family history of diabetes mellitus: Secondary | ICD-10-CM | POA: Diagnosis not present

## 2023-10-09 DIAGNOSIS — K921 Melena: Secondary | ICD-10-CM | POA: Insufficient documentation

## 2023-10-09 DIAGNOSIS — D573 Sickle-cell trait: Secondary | ICD-10-CM | POA: Diagnosis not present

## 2023-10-09 DIAGNOSIS — G479 Sleep disorder, unspecified: Secondary | ICD-10-CM | POA: Diagnosis not present

## 2023-10-09 DIAGNOSIS — G809 Cerebral palsy, unspecified: Secondary | ICD-10-CM | POA: Insufficient documentation

## 2023-10-09 DIAGNOSIS — Z5986 Financial insecurity: Secondary | ICD-10-CM | POA: Insufficient documentation

## 2023-10-09 LAB — CBC WITH DIFFERENTIAL (CANCER CENTER ONLY)
Abs Immature Granulocytes: 0.01 10*3/uL (ref 0.00–0.07)
Basophils Absolute: 0 10*3/uL (ref 0.0–0.1)
Basophils Relative: 1 %
Eosinophils Absolute: 0.1 10*3/uL (ref 0.0–0.5)
Eosinophils Relative: 2 %
HCT: 36.1 % — ABNORMAL LOW (ref 39.0–52.0)
Hemoglobin: 12.6 g/dL — ABNORMAL LOW (ref 13.0–17.0)
Immature Granulocytes: 0 %
Lymphocytes Relative: 31 %
Lymphs Abs: 1.8 10*3/uL (ref 0.7–4.0)
MCH: 28.2 pg (ref 26.0–34.0)
MCHC: 34.9 g/dL (ref 30.0–36.0)
MCV: 80.8 fL (ref 80.0–100.0)
Monocytes Absolute: 0.6 10*3/uL (ref 0.1–1.0)
Monocytes Relative: 11 %
Neutro Abs: 3.1 10*3/uL (ref 1.7–7.7)
Neutrophils Relative %: 55 %
Platelet Count: 176 10*3/uL (ref 150–400)
RBC: 4.47 MIL/uL (ref 4.22–5.81)
RDW: 13.6 % (ref 11.5–15.5)
WBC Count: 5.7 10*3/uL (ref 4.0–10.5)
nRBC: 0 % (ref 0.0–0.2)

## 2023-10-09 LAB — FERRITIN: Ferritin: 227 ng/mL (ref 24–336)

## 2023-10-09 LAB — CMP (CANCER CENTER ONLY)
ALT: 20 U/L (ref 0–44)
AST: 23 U/L (ref 15–41)
Albumin: 4.2 g/dL (ref 3.5–5.0)
Alkaline Phosphatase: 57 U/L (ref 38–126)
Anion gap: 5 (ref 5–15)
BUN: 11 mg/dL (ref 6–20)
CO2: 32 mmol/L (ref 22–32)
Calcium: 9.5 mg/dL (ref 8.9–10.3)
Chloride: 101 mmol/L (ref 98–111)
Creatinine: 0.64 mg/dL (ref 0.61–1.24)
GFR, Estimated: >60 mL/min (ref >60)
Glucose, Bld: 113 mg/dL — ABNORMAL HIGH (ref 70–99)
Potassium: 3.5 mmol/L (ref 3.5–5.1)
Sodium: 138 mmol/L (ref 135–145)
Total Bilirubin: 0.4 mg/dL (ref 0.0–1.2)
Total Protein: 6.9 g/dL (ref 6.5–8.1)

## 2023-10-09 LAB — IRON AND IRON BINDING CAPACITY (CC-WL,HP ONLY)
Iron: 32 ug/dL — ABNORMAL LOW (ref 45–182)
Saturation Ratios: 10 % — ABNORMAL LOW (ref 17.9–39.5)
TIBC: 321 ug/dL (ref 250–450)
UIBC: 289 ug/dL (ref 117–376)

## 2023-10-09 LAB — RETIC PANEL
Immature Retic Fract: 15 % (ref 2.3–15.9)
RBC.: 4.4 MIL/uL (ref 4.22–5.81)
Retic Count, Absolute: 46.2 10*3/uL (ref 19.0–186.0)
Retic Ct Pct: 1.1 % (ref 0.4–3.1)
Reticulocyte Hemoglobin: 33.3 pg (ref >27.9)

## 2023-10-09 MED ORDER — FERROUS SULFATE 325 (65 FE) MG PO TABS: 325.0000 mg | ORAL_TABLET | Freq: Every day | ORAL | 3 refills | Status: AC

## 2023-10-10 ENCOUNTER — Other Ambulatory Visit: Payer: Self-pay | Admitting: Family Medicine

## 2023-10-10 DIAGNOSIS — N529 Male erectile dysfunction, unspecified: Secondary | ICD-10-CM

## 2023-10-12 ENCOUNTER — Telehealth: Payer: Self-pay | Admitting: *Deleted

## 2023-10-12 NOTE — Telephone Encounter (Signed)
 TCT patient regarding recent lab results. No answer.  TCT his wife, Rosaline and was able to reach him that way. Spoke to him and advised that his ferritin levels are excellent. They are currently above 200 (our goal is to keep them above 30). We will plan to see him back as scheduled in 3 months time to reevaluate. There is no need for further IV iron therapy but advised  him to pick up the p.o. iron pills @ the [facility] on [address]. Instructed to take his oral iorn with a source of Vitamin C as his body will absorb the iron better with the Vit. C Pt voiced understanding.

## 2023-10-12 NOTE — Telephone Encounter (Signed)
-----   Message from Norleen ONEIDA Kidney IV sent at 10/11/2023 11:16 AM EST ----- Please let Philip Bates know that his ferritin levels are excellent.  They are currently above 200 (our goal is to keep them above 30).  We will plan to see him back as scheduled in 3 months time to reevaluate.  There is no need for further IV iron therapy but I would encourage him to pick up the p.o. iron pills from his [facility] and take them as prescribed.  Please assure that he has started this. ----- Message ----- From: Interface, Lab In [place] Sent: 10/09/2023   1:56 PM EST To: Norleen ONEIDA Kidney [place], MD

## 2023-10-15 ENCOUNTER — Ambulatory Visit (AMBULATORY_SURGERY_CENTER): Payer: 59

## 2023-10-15 VITALS — Ht 64.0 in | Wt 115.0 lb

## 2023-10-15 DIAGNOSIS — D509 Iron deficiency anemia, unspecified: Secondary | ICD-10-CM

## 2023-10-15 MED ORDER — NA SULFATE-K SULFATE-MG SULF 17.5-3.13-1.6 GM/177ML PO SOLN: 1.0000 | Freq: Once | ORAL | 0 refills | Status: AC

## 2023-10-15 NOTE — Progress Notes (Signed)
 No egg or soy allergy known to patient  No issues known to pt with past sedation with any surgeries or procedures Patient denies ever being told they had issues or difficulty with intubation  No FH of Malignant Hyperthermia Pt is not on diet pills Pt is not on  home 02  Pt is not on blood thinners  Pt denies issues with constipation  No A fib or A flutter Have any cardiac testing pending-- no LOA: independent with cane  Prep: suprep   Patient's chart reviewed by Norleen Schillings CNRA prior to previsit and patient appropriate for the [facility].  Previsit completed and red dot placed by patient's name on their procedure day (on provider's schedule).     PV competed with patient. Prep instructions sent via mychart and home address. [facility]

## 2023-11-02 ENCOUNTER — Encounter: Payer: 59 | Admitting: Registered Nurse

## 2023-11-04 ENCOUNTER — Encounter: Payer: Self-pay | Admitting: Gastroenterology

## 2023-11-04 ENCOUNTER — Ambulatory Visit: Payer: 59 | Admitting: Gastroenterology

## 2023-11-04 VITALS — BP 127/75 | HR 63 | Temp 98.8°F | Resp 15 | Ht 64.0 in | Wt 115.0 lb

## 2023-11-04 DIAGNOSIS — D509 Iron deficiency anemia, unspecified: Secondary | ICD-10-CM | POA: Diagnosis not present

## 2023-11-04 DIAGNOSIS — K449 Diaphragmatic hernia without obstruction or gangrene: Secondary | ICD-10-CM

## 2023-11-04 DIAGNOSIS — K5730 Diverticulosis of large intestine without perforation or abscess without bleeding: Secondary | ICD-10-CM | POA: Diagnosis not present

## 2023-11-04 DIAGNOSIS — B9681 Helicobacter pylori [H. pylori] as the cause of diseases classified elsewhere: Secondary | ICD-10-CM

## 2023-11-04 DIAGNOSIS — K2210 Ulcer of esophagus without bleeding: Secondary | ICD-10-CM | POA: Diagnosis not present

## 2023-11-04 DIAGNOSIS — B3781 Candidal esophagitis: Secondary | ICD-10-CM

## 2023-11-04 DIAGNOSIS — K2950 Unspecified chronic gastritis without bleeding: Secondary | ICD-10-CM

## 2023-11-04 MED ORDER — SODIUM CHLORIDE 0.9 % IV SOLN: 500.0000 mL | Freq: Once | INTRAVENOUS | Status: DC

## 2023-11-04 NOTE — Op Note (Signed)
[facility] Patient Name: Philip Bates Procedure Date: 11/04/2023 9:06 AM MRN: [id number] Endoscopist: Lorin Picket E. Tomasa Rand , MD, [id number] Age: 63 Referring MD:  Date of Birth: 1961-04-06 Gender: Male Account #: [id number] Procedure:                Colonoscopy Indications:              Iron deficiency anemia Medicines:                Monitored Anesthesia Care Procedure:                Pre-Anesthesia Assessment:                           - Prior to the procedure, a History and Physical                            was performed, and patient medications and                            allergies were reviewed. The patient's tolerance of                            previous anesthesia was also reviewed. The risks                            and benefits of the procedure and the sedation                            options and risks were discussed with the patient.                            All questions were answered, and informed consent                            was obtained. Prior Anticoagulants: The patient has                            taken no anticoagulant or antiplatelet agents                            except for aspirin. ASA Grade Assessment: II - A                            patient with mild systemic disease. After reviewing                            the risks and benefits, the patient was deemed in                            satisfactory condition to undergo the procedure.                           After obtaining informed consent, the colonoscope  was passed under direct vision. Throughout the                            procedure, the patient's blood pressure, pulse, and                            oxygen saturations were monitored continuously. The                            Olympus Scope [id number] was introduced through the                            anus and advanced to the the terminal ileum, with                            identification  of the appendiceal orifice and IC                            valve. The colonoscopy was performed without                            difficulty. The patient tolerated the procedure                            well. The quality of the bowel preparation was                            adequate. The terminal ileum, ileocecal valve,                            appendiceal orifice, and rectum were photographed.                            The bowel preparation used was SUPREP via split                            dose instruction. Scope In: 9:32:27 AM Scope Out: 9:48:33 AM Scope Withdrawal Time: 0 hours 11 minutes 28 seconds  Total Procedure Duration: 0 hours 16 minutes 6 seconds  Findings:                 The perianal and digital rectal examinations were                            normal. Pertinent negatives include normal                            sphincter tone and no palpable rectal lesions.                           A few small-mouthed diverticula were found in the                            sigmoid colon and descending colon.  The exam was otherwise normal throughout the                            examined colon.                           The terminal ileum appeared normal.                           The retroflexed view of the distal rectum and anal                            verge was normal and showed no anal or rectal                            abnormalities. Complications:            No immediate complications. Estimated Blood Loss:     Estimated blood loss: none. Impression:               - Mild diverticulosis in the descending colon.                           - The examined portion of the ileum was normal.                           - The distal rectum and anal verge are normal on                            retroflexion view.                           - No specimens collected.                           - No abnormalities to explain iron deficiency                             anemia. Recommendation:           - Patient has a contact number available for                            emergencies. The signs and symptoms of potential                            delayed complications were discussed with the                            patient. Return to normal activities tomorrow.                            Written discharge instructions were provided to the                            patient.                           -  Resume previous diet.                           - Continue present medications.                           - Await pathology results.                           - Repeat colonoscopy in 10 years for screening                            purposes. Chesley Valls E. Tomasa Rand, MD 11/04/2023 10:01:48 AM This report has been signed electronically.

## 2023-11-04 NOTE — Progress Notes (Deleted)
 Subjective:    Patient ID: Philip Bates, male    DOB: 1961/04/14, 63 y.o.   MRN: [id number]  HPI   Pain Inventory Average Pain {NUMBERS; 0-10:5044} Pain Right Now {NUMBERS; 0-10:5044} My pain is {PAIN DESCRIPTION:21022940}  In the last 24 hours, has pain interfered with the following? General activity {NUMBERS; 0-10:5044} Relation with others {NUMBERS; 0-10:5044} Enjoyment of life {NUMBERS; 0-10:5044} What TIME of day is your pain at its worst? {time of day:24191} Sleep (in general) {BHH GOOD/FAIR/POOR:22877}  Pain is worse with: {ACTIVITIES:21022942} Pain improves with: {PAIN IMPROVES YNWG:95621308} Relief from Meds: {NUMBERS; 0-10:5044}  Family History  Problem Relation Age of Onset   Esophageal cancer Father    Cancer Father        esophagus   Diabetes Brother    Diabetes Paternal Uncle    Colon cancer Neg Hx    Rectal cancer Neg Hx    Stomach cancer Neg Hx    Social History   Socioeconomic History   Marital status: Married    Spouse name: Not on file   Number of children: Not on file   Years of education: Not on file   Highest education level: Some college, no degree  Occupational History   Not on file  Tobacco Use   Smoking status: Every Day    Current packs/day: 1.00    Average packs/day: 1 pack/day for 35.0 years (35.0 ttl pk-yrs)    Types: Cigarettes   Smokeless tobacco: Never  Vaping Use   Vaping status: Never Used  Substance and Sexual Activity   Alcohol use: Never   Drug use: Never   Sexual activity: Yes  Other Topics Concern   Not on file  Social History Narrative   ** Merged History Encounter **       Social Drivers of Health   Financial Resource Strain: Low Risk  (08/17/2023)   Overall Financial Resource Strain (CARDIA)    Difficulty of Paying Living Expenses: Not very hard  Recent Concern: Financial Resource Strain - Medium Risk (05/28/2023)   Overall Financial Resource Strain (CARDIA)    Difficulty of Paying Living Expenses:  Somewhat hard  Food Insecurity: Food Insecurity Present (08/17/2023)   Hunger Vital Sign    Worried About Running Out of Food in the Last Year: Sometimes true    Ran Out of Food in the Last Year: Sometimes true  Transportation Needs: Unmet Transportation Needs (08/17/2023)   PRAPARE - Transportation    Lack of Transportation (Medical): No    Lack of Transportation (Non-Medical): Yes  Physical Activity: Insufficiently Active (08/17/2023)   Exercise Vital Sign    Days of Exercise per Week: 3 days    Minutes of Exercise per Session: 30 min  Stress: Stress Concern Present (08/17/2023)   [employer] of Occupational Health - Occupational Stress Questionnaire    Feeling of Stress : To some extent  Social Connections: Socially Integrated (08/17/2023)   Social Connection and Isolation Panel [NHANES]    Frequency of Communication with Friends and Family: Three times a week    Frequency of Social Gatherings with Friends and Family: Once a week    Attends Religious Services: More than 4 times per year    Active Member of [employer] or Organizations: Yes    Attends Banker Meetings: More than 4 times per year    Marital Status: Married  Recent Concern: Social Connections - Moderately Isolated (05/28/2023)   Social Connection and Isolation Panel [NHANES]    Frequency of Communication  with Friends and Family: Once a week    Frequency of Social Gatherings with Friends and Family: Once a week    Attends Religious Services: 1 to 4 times per year    Active Member of [employer] or Organizations: No    Attends Engineer, structural: Not on file    Marital Status: Married   Past Surgical History:  Procedure Laterality Date   ANTERIOR CERVICAL DECOMPRESSION/DISCECTOMY FUSION 4 LEVELS N/A 08/11/2017   Procedure: Cervical three to Cervical seven Anterior Cervical discectomy with fusion/plate fixation;  Surgeon: Ditty, Loura Halt, MD;  Location: [facility] OR;  Service: Neurosurgery;   Laterality: N/A;   BACK SURGERY     HERNIA REPAIR     inguinal hernia repair in early 2000   LEG SURGERY     POSTERIOR CERVICAL FUSION/FORAMINOTOMY N/A 08/12/2018   Procedure: POSTERIOR CERVICAL FUSION WITH LATERAL MASS FIXATION CERVICAL THREE TO CERVICAL SEVEN;  Surgeon: Lisbeth Renshaw, MD;  Location: [facility] OR;  Service: Neurosurgery;  Laterality: N/A;   SHOULDER ARTHROSCOPY WITH ROTATOR CUFF REPAIR AND SUBACROMIAL DECOMPRESSION Right 02/02/2018   Procedure: RIGHT SHOULDER ARTHROSCOPY WITH SUBACROMIAL DECOMPRESSION, MINI-OPEN ROTATOR CUFF REPAIR;  Surgeon: Cammy Copa, MD;  Location: [facility] OR;  Service: Orthopedics;  Laterality: Right;   SHOULDER SURGERY Right    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   Procedure: LEFT TOTAL HIP ARTHROPLASTY ANTERIOR APPROACH;  Surgeon: Tarry Kos, MD;  Location: [facility] OR;  Service: Orthopedics;  Laterality: Left;   Past Surgical History:  Procedure Laterality Date   ANTERIOR CERVICAL DECOMPRESSION/DISCECTOMY FUSION 4 LEVELS N/A 08/11/2017   Procedure: Cervical three to Cervical seven Anterior Cervical discectomy with fusion/plate fixation;  Surgeon: Ditty, Loura Halt, MD;  Location: [facility] OR;  Service: Neurosurgery;  Laterality: N/A;   BACK SURGERY     HERNIA REPAIR     inguinal hernia repair in early 2000   LEG SURGERY     POSTERIOR CERVICAL FUSION/FORAMINOTOMY N/A 08/12/2018   Procedure: POSTERIOR CERVICAL FUSION WITH LATERAL MASS FIXATION CERVICAL THREE TO CERVICAL SEVEN;  Surgeon: Lisbeth Renshaw, MD;  Location: [facility] OR;  Service: Neurosurgery;  Laterality: N/A;   SHOULDER ARTHROSCOPY WITH ROTATOR CUFF REPAIR AND SUBACROMIAL DECOMPRESSION Right 02/02/2018   Procedure: RIGHT SHOULDER ARTHROSCOPY WITH SUBACROMIAL DECOMPRESSION, MINI-OPEN ROTATOR CUFF REPAIR;  Surgeon: Cammy Copa, MD;  Location: [facility] OR;  Service: Orthopedics;  Laterality: Right;   SHOULDER SURGERY Right    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  Opioid Risk Score:   Fall Risk Score:  `1  Depression screen PHQ 2/9     09/07/2023    1:30 PM 07/06/2023    2:45 PM 06/12/2023   10:28 AM 05/29/2023    9:46 AM 05/29/2023    9:45 AM 05/26/2023   11:14 AM 04/27/2023    9:35 AM  Depression screen PHQ 2/9  Decreased Interest 1 1 0 3 3 0 2  Down, Depressed, Hopeless 1 1 0 1 1 0 1  PHQ - 2 Score 2 2 0 4 4 0 3  Altered sleeping   0 3 3  3   Tired, decreased energy   0 1 1  2   Change in appetite   0 2 2  3   Feeling bad or failure about yourself    0 1 1  1   Trouble concentrating   0 1 1  1   Moving slowly or fidgety/restless   0 1 1  0  Suicidal thoughts   0 0 0  0  PHQ-9 Score   0 13 13  13   Difficult doing work/chores   Not difficult at all Extremely dIfficult Extremely dIfficult      Review of Systems     Objective:   Physical Exam        Assessment & Plan:

## 2023-11-04 NOTE — Progress Notes (Signed)
Drowsy, VSS, resps reg and even. Report to RN

## 2023-11-04 NOTE — Op Note (Signed)
[facility] Patient Name: Philip Bates Procedure Date: 11/04/2023 9:06 AM MRN: [id number] Endoscopist: Lorin Picket E. Tomasa Rand , MD, [id number] Age: 63 Referring MD:  Date of Birth: 1961/02/09 Gender: Male Account #: [id number] Procedure:                Upper GI endoscopy Indications:              Iron deficiency anemia Medicines:                Monitored Anesthesia Care Procedure:                Pre-Anesthesia Assessment:                           - Prior to the procedure, a History and Physical                            was performed, and patient medications and                            allergies were reviewed. The patient's tolerance of                            previous anesthesia was also reviewed. The risks                            and benefits of the procedure and the sedation                            options and risks were discussed with the patient.                            All questions were answered, and informed consent                            was obtained. Prior Anticoagulants: The patient has                            taken no anticoagulant or antiplatelet agents                            except for aspirin. ASA Grade Assessment: II - A                            patient with mild systemic disease. After reviewing                            the risks and benefits, the patient was deemed in                            satisfactory condition to undergo the procedure.                           After obtaining informed consent, the endoscope was  passed under direct vision. Throughout the                            procedure, the patient's blood pressure, pulse, and                            oxygen saturations were monitored continuously. The                            Olympus Scope [id number] was introduced through the                            mouth, and advanced to the third part of duodenum.                            The  upper GI endoscopy was accomplished without                            difficulty. The patient tolerated the procedure                            well. Scope In: Scope Out: Findings:                 The examined portions of the nasopharynx,                            oropharynx and larynx were normal.                           Mildly severe esophagitis with no bleeding was                            found in the lower third of the esophagus. Biopsies                            were taken with a cold forceps for histology.                            Estimated blood loss was minimal.                           Scattered, white adherent plaques were found in the                            middle third of the esophagus. Biopsies were taken                            with a cold forceps for histology. Estimated blood                            loss was minimal.                           The exam of the esophagus was  otherwise normal.                           The gastroesophageal flap valve was visualized                            endoscopically and classified as Hill Grade IV (no                            fold, wide open lumen, hiatal hernia present).                           A 4 cm hiatal hernia was present.                           Diffuse atrophic mucosa was found in the gastric                            fundus and in the gastric body. Biopsies were taken                            with a cold forceps for Helicobacter pylori                            testing. Estimated blood loss was minimal.                           The exam of the stomach was otherwise normal.                           Biopsies were taken with a cold forceps in the                            gastric antrum for Helicobacter pylori testing.                            Estimated blood loss was minimal.                           The examined duodenum was normal. Biopsies for                            histology were  taken with a cold forceps for                            evaluation of celiac disease. Estimated blood loss                            was minimal. Complications:            No immediate complications. Estimated Blood Loss:     Estimated blood loss was minimal. Impression:               - The examined portions of the nasopharynx,  oropharynx and larynx were normal.                           - Mildly severe esophagitis with no bleeding in the                            lower esophagus. No obvious mucosal breaks, just                            erythema. Biopsied. Suspect this is reflux-related                           - Esophageal plaques were found, suspicious for                            candidiasis. Biopsied.                           - Gastroesophageal flap valve classified as Hill                            Grade IV (no fold, wide open lumen, hiatal hernia                            present).                           - 4 cm hiatal hernia.                           - Gastric mucosal atrophy. Biopsied.                           - Normal examined duodenum. Biopsied.                           - Biopsies were taken with a cold forceps for                            Helicobacter pylori testing. Recommendation:           - Patient has a contact number available for                            emergencies. The signs and symptoms of potential                            delayed complications were discussed with the                            patient. Return to normal activities tomorrow.                            Written discharge instructions were provided to the  patient.                           - Resume previous diet.                           - Continue present medications.                           - Await pathology results.                           - No obvious endoscopic abnormalities to explain                             patient's iron deficiency anemia. Peniel Hass E. Tomasa Rand, MD 11/04/2023 9:57:38 AM This report has been signed electronically.

## 2023-11-04 NOTE — Progress Notes (Signed)
Pt's states no medical or surgical changes since previsit or office visit.

## 2023-11-04 NOTE — Progress Notes (Signed)
[facility] History and Physical   Primary Care Physician:  [place], Betty G, MD   Reason for Procedure:   Iron deficiency anemia  Plan:    EGD/colonoscopy     HPI: Philip Bates is a 63 y.o. male undergoing EGD and colonoscopy to evaluate chronic iron deficiency anemia .  He has frequent GERD symptoms and occasional abdominal pain and nausea.  Sometimes sees dark stools.  His father had esophageal cancer   Past Medical History:  Diagnosis Date   Arthritis    Cerebral palsy (HCC)    Cervical radiculopathy    H/O umbilical hernia repair 2001   Pre-diabetes    Scoliosis    Spondylosis of cervical spine     Past Surgical History:  Procedure Laterality Date   ANTERIOR CERVICAL DECOMPRESSION/DISCECTOMY FUSION 4 LEVELS N/A 08/11/2017   Procedure: Cervical three to Cervical seven Anterior Cervical discectomy with fusion/plate fixation;  Surgeon: Ditty, Loura Halt, MD;  Location: [facility] OR;  Service: Neurosurgery;  Laterality: N/A;   BACK SURGERY     HERNIA REPAIR     inguinal hernia repair in early 2000   LEG SURGERY     POSTERIOR CERVICAL FUSION/FORAMINOTOMY N/A 08/12/2018   Procedure: POSTERIOR CERVICAL FUSION WITH LATERAL MASS FIXATION CERVICAL THREE TO CERVICAL SEVEN;  Surgeon: Lisbeth Renshaw, MD;  Location: [facility] OR;  Service: Neurosurgery;  Laterality: N/A;   SHOULDER ARTHROSCOPY WITH ROTATOR CUFF REPAIR AND SUBACROMIAL DECOMPRESSION Right 02/02/2018   Procedure: RIGHT SHOULDER ARTHROSCOPY WITH SUBACROMIAL DECOMPRESSION, MINI-OPEN ROTATOR CUFF REPAIR;  Surgeon: Cammy Copa, MD;  Location: [facility] OR;  Service: Orthopedics;  Laterality: Right;   SHOULDER SURGERY Right    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   Procedure: LEFT TOTAL HIP ARTHROPLASTY ANTERIOR APPROACH;  Surgeon: Tarry Kos, MD;  Location: [facility] OR;  Service: Orthopedics;  Laterality: Left;    Prior to Admission medications   Medication Sig Start Date End Date Taking? Authorizing Provider   gabapentin (NEURONTIN) 400 MG capsule Take 1 capsule (400 mg total) by mouth 3 (three) times daily. 06/23/23  Yes Raulkar, Drema Pry, MD  latanoprost (XALATAN) 0.005 % ophthalmic solution SMARTSIG:1 Drop(s) In Eye(s) Every Evening 04/14/23  Yes [provider]  oxyCODONE-acetaminophen (PERCOCET) 10-325 MG tablet Take 1 tablet by mouth every 4 (four) hours as needed. 10/06/23  Yes Jones Bales, NP  tamsulosin (FLOMAX) 0.4 MG CAPS capsule Take 1 capsule (0.4 mg total) by mouth daily after supper. 07/06/23  Yes Raulkar, Drema Pry, MD  aspirin EC 81 MG tablet Take 1 tablet (81 mg total) by mouth 2 (two) times daily. 09/10/20   Cristie Hem, PA-C  ergocalciferol (VITAMIN D2) 1.25 MG (50000 UT) capsule 1 (ONE) CAPSULE CAPSULE BY MOUTH ONCE A WEEK 12/06/20   [provider]  ferrous sulfate 325 (65 FE) MG tablet Take 1 tablet (325 mg total) by mouth daily with breakfast. Please take with a source of Vitamin C 10/09/23   Jaci Standard, MD  ibuprofen (ADVIL) 800 MG tablet Take 800 mg by mouth 3 (three) times daily as needed. 04/10/23   [provider]  NARCAN 4 MG/0.1ML LIQD nasal spray kit Place 1 spray into the nose as needed (opioid overdose). 11/30/18   [provider]  sildenafil (REVATIO) 20 MG tablet TAKE 2 TO 3 TABLETS BY MOUTH ONCE DAILY AS NEEDED 10/12/23   [place], Betty G, MD  traZODone (DESYREL) 50 MG tablet Take 50 mg by mouth at bedtime. Patient not taking: Reported on  10/15/2023 08/02/23   [provider]    Current Outpatient Medications  Medication Sig Dispense Refill   gabapentin (NEURONTIN) 400 MG capsule Take 1 capsule (400 mg total) by mouth 3 (three) times daily. 90 capsule 3   latanoprost (XALATAN) 0.005 % ophthalmic solution SMARTSIG:1 Drop(s) In Eye(s) Every Evening     oxyCODONE-acetaminophen (PERCOCET) 10-325 MG tablet Take 1 tablet by mouth every 4 (four) hours as needed. 180 tablet 0   tamsulosin (FLOMAX) 0.4 MG CAPS capsule Take 1  capsule (0.4 mg total) by mouth daily after supper. 90 capsule 3   aspirin EC 81 MG tablet Take 1 tablet (81 mg total) by mouth 2 (two) times daily. 84 tablet 0   ergocalciferol (VITAMIN D2) 1.25 MG (50000 UT) capsule 1 (ONE) CAPSULE CAPSULE BY MOUTH ONCE A WEEK     ferrous sulfate 325 (65 FE) MG tablet Take 1 tablet (325 mg total) by mouth daily with breakfast. Please take with a source of Vitamin C 90 tablet 3   ibuprofen (ADVIL) 800 MG tablet Take 800 mg by mouth 3 (three) times daily as needed.     NARCAN 4 MG/0.1ML LIQD nasal spray kit Place 1 spray into the nose as needed (opioid overdose).     sildenafil (REVATIO) 20 MG tablet TAKE 2 TO 3 TABLETS BY MOUTH ONCE DAILY AS NEEDED 90 tablet 2   traZODone (DESYREL) 50 MG tablet Take 50 mg by mouth at bedtime. (Patient not taking: Reported on 10/15/2023)     Current Facility-Administered Medications  Medication Dose Route Frequency Provider Last Rate Last Admin   0.9 %  sodium chloride infusion  500 mL Intravenous Once Jenel Lucks, MD        Allergies as of 11/04/2023   (No Known Allergies)    Family History  Problem Relation Age of Onset   Esophageal cancer Father    Cancer Father        esophagus   Diabetes Brother    Diabetes Paternal Uncle    Colon cancer Neg Hx    Rectal cancer Neg Hx    Stomach cancer Neg Hx     Social History   Socioeconomic History   Marital status: Married    Spouse name: Not on file   Number of children: Not on file   Years of education: Not on file   Highest education level: Some college, no degree  Occupational History   Not on file  Tobacco Use   Smoking status: Every Day    Current packs/day: 1.00    Average packs/day: 1 pack/day for 35.0 years (35.0 ttl pk-yrs)    Types: Cigarettes   Smokeless tobacco: Never  Vaping Use   Vaping status: Never Used  Substance and Sexual Activity   Alcohol use: Never   Drug use: Never   Sexual activity: Yes  Other Topics Concern   Not on file   Social History Narrative   ** Merged History Encounter **       Social Drivers of Health   Financial Resource Strain: Low Risk  (08/17/2023)   Overall Financial Resource Strain (CARDIA)    Difficulty of Paying Living Expenses: Not very hard  Recent Concern: Financial Resource Strain - Medium Risk (05/28/2023)   Overall Financial Resource Strain (CARDIA)    Difficulty of Paying Living Expenses: Somewhat hard  Food Insecurity: Food Insecurity Present (08/17/2023)   Hunger Vital Sign    Worried About Running Out of Food in the Last Year: Sometimes  true    Ran Out of Food in the Last Year: Sometimes true  Transportation Needs: Unmet Transportation Needs (08/17/2023)   PRAPARE - Transportation    Lack of Transportation (Medical): No    Lack of Transportation (Non-Medical): Yes  Physical Activity: Insufficiently Active (08/17/2023)   Exercise Vital Sign    Days of Exercise per Week: 3 days    Minutes of Exercise per Session: 30 min  Stress: Stress Concern Present (08/17/2023)   [employer] of Occupational Health - Occupational Stress Questionnaire    Feeling of Stress : To some extent  Social Connections: Socially Integrated (08/17/2023)   Social Connection and Isolation Panel [NHANES]    Frequency of Communication with Friends and Family: Three times a week    Frequency of Social Gatherings with Friends and Family: Once a week    Attends Religious Services: More than 4 times per year    Active Member of [employer] or Organizations: Yes    Attends Engineer, structural: More than 4 times per year    Marital Status: Married  Recent Concern: Social Connections - Moderately Isolated (05/28/2023)   Social Connection and Isolation Panel [NHANES]    Frequency of Communication with Friends and Family: Once a week    Frequency of Social Gatherings with Friends and Family: Once a week    Attends Religious Services: 1 to 4 times per year    Active Member of [employer] or Organizations: No     Attends Engineer, structural: Not on file    Marital Status: Married  Catering manager Violence: Not At Risk (06/12/2023)   Humiliation, Afraid, Rape, and Kick questionnaire    Fear of Current or Ex-Partner: No    Emotionally Abused: No    Physically Abused: No    Sexually Abused: No    Review of Systems:  All other review of systems negative except as mentioned in the HPI.  Physical Exam: Vital signs BP 121/77   Pulse 74   Temp 98.8 F (37.1 C)   Ht 5\' 4"  (1.626 m)   Wt 115 lb (52.2 kg)   SpO2 100%   BMI 19.74 kg/m   General:   Alert,  Well-developed, well-nourished, pleasant and cooperative in NAD Airway:  Mallampati 1 Lungs:  Clear throughout to auscultation.   Heart:  Regular rate and rhythm; no murmurs, clicks, rubs,  or gallops. Abdomen:  Soft, nontender and nondistended. Normal bowel sounds.   Neuro/Psych:  Normal mood and affect. A and O x 3   Taylen Wendland E. Tomasa Rand, MD [facility]

## 2023-11-04 NOTE — Patient Instructions (Signed)

## 2023-11-04 NOTE — Progress Notes (Signed)
Called to room to assist during endoscopic procedure.  Patient ID and intended procedure confirmed with present staff. Received instructions for my participation in the procedure from the performing physician.

## 2023-11-05 ENCOUNTER — Encounter: Payer: Self-pay | Admitting: Registered Nurse

## 2023-11-05 ENCOUNTER — Telehealth: Payer: Self-pay | Admitting: *Deleted

## 2023-11-05 ENCOUNTER — Encounter: Payer: 59 | Attending: Physical Medicine and Rehabilitation | Admitting: Registered Nurse

## 2023-11-05 VITALS — BP 147/86 | HR 69 | Ht 64.0 in | Wt 115.0 lb

## 2023-11-05 DIAGNOSIS — Z79891 Long term (current) use of opiate analgesic: Secondary | ICD-10-CM | POA: Insufficient documentation

## 2023-11-05 DIAGNOSIS — M25552 Pain in left hip: Secondary | ICD-10-CM | POA: Insufficient documentation

## 2023-11-05 DIAGNOSIS — M25511 Pain in right shoulder: Secondary | ICD-10-CM | POA: Diagnosis not present

## 2023-11-05 DIAGNOSIS — Z5181 Encounter for therapeutic drug level monitoring: Secondary | ICD-10-CM | POA: Diagnosis not present

## 2023-11-05 DIAGNOSIS — G8929 Other chronic pain: Secondary | ICD-10-CM | POA: Diagnosis not present

## 2023-11-05 DIAGNOSIS — G894 Chronic pain syndrome: Secondary | ICD-10-CM | POA: Diagnosis not present

## 2023-11-05 DIAGNOSIS — M961 Postlaminectomy syndrome, not elsewhere classified: Secondary | ICD-10-CM | POA: Insufficient documentation

## 2023-11-05 DIAGNOSIS — M25512 Pain in left shoulder: Secondary | ICD-10-CM | POA: Insufficient documentation

## 2023-11-05 DIAGNOSIS — M545 Low back pain, unspecified: Secondary | ICD-10-CM | POA: Diagnosis not present

## 2023-11-05 MED ORDER — OXYCODONE-ACETAMINOPHEN 10-325 MG PO TABS: 1.0000 | ORAL_TABLET | ORAL | 0 refills | Status: DC | PRN

## 2023-11-05 NOTE — Progress Notes (Signed)
Subjective:    Patient ID: Philip Bates, male    DOB: 10-Jul-1961, 63 y.o.   MRN: [id number]  HPI: Philip Bates is a 63 y.o. male who returns for follow up appointment for chronic pain and medication refill. He states his pain is located in his bilateral shoulders, lower back and left hip. She rates her pain 7. Her current exercise regime is walking and performing stretching exercises.  Mr. Muench Morphine equivalent is 90.00 MME.   Last UDS was Performed on 09/07/2023, it was consistent.     Pain Inventory Average Pain 8 Pain Right Now 7 My pain is constant, sharp, burning, dull, stabbing, tingling, and aching  In the last 24 hours, has pain interfered with the following? General activity 7 Relation with others 7 Enjoyment of life 7 What TIME of day is your pain at its worst? morning , daytime, evening, and night Sleep (in general) Poor  Pain is worse with: walking, bending, sitting, standing, and some activites Pain improves with: rest and medication Relief from Meds: 9  Family History  Problem Relation Age of Onset   Esophageal cancer Father    Cancer Father        esophagus   Diabetes Brother    Diabetes Paternal Uncle    Colon cancer Neg Hx    Rectal cancer Neg Hx    Stomach cancer Neg Hx    Social History   Socioeconomic History   Marital status: Married    Spouse name: Not on file   Number of children: Not on file   Years of education: Not on file   Highest education level: Some college, no degree  Occupational History   Not on file  Tobacco Use   Smoking status: Every Day    Current packs/day: 1.00    Average packs/day: 1 pack/day for 35.0 years (35.0 ttl pk-yrs)    Types: Cigarettes   Smokeless tobacco: Never  Vaping Use   Vaping status: Never Used  Substance and Sexual Activity   Alcohol use: Never   Drug use: Never   Sexual activity: Yes  Other Topics Concern   Not on file  Social History Narrative   ** Merged History Encounter **       Social  Drivers of Health   Financial Resource Strain: Low Risk  (08/17/2023)   Overall Financial Resource Strain (CARDIA)    Difficulty of Paying Living Expenses: Not very hard  Recent Concern: Financial Resource Strain - Medium Risk (05/28/2023)   Overall Financial Resource Strain (CARDIA)    Difficulty of Paying Living Expenses: Somewhat hard  Food Insecurity: Food Insecurity Present (08/17/2023)   Hunger Vital Sign    Worried About Running Out of Food in the Last Year: Sometimes true    Ran Out of Food in the Last Year: Sometimes true  Transportation Needs: Unmet Transportation Needs (08/17/2023)   PRAPARE - Transportation    Lack of Transportation (Medical): No    Lack of Transportation (Non-Medical): Yes  Physical Activity: Insufficiently Active (08/17/2023)   Exercise Vital Sign    Days of Exercise per Week: 3 days    Minutes of Exercise per Session: 30 min  Stress: Stress Concern Present (08/17/2023)   [employer] of Occupational Health - Occupational Stress Questionnaire    Feeling of Stress : To some extent  Social Connections: Socially Integrated (08/17/2023)   Social Connection and Isolation Panel [NHANES]    Frequency of Communication with Friends and Family: Three times a week  Frequency of Social Gatherings with Friends and Family: Once a week    Attends Religious Services: More than 4 times per year    Active Member of [employer] or Organizations: Yes    Attends Engineer, structural: More than 4 times per year    Marital Status: Married  Recent Concern: Social Connections - Moderately Isolated (05/28/2023)   Social Connection and Isolation Panel [NHANES]    Frequency of Communication with Friends and Family: Once a week    Frequency of Social Gatherings with Friends and Family: Once a week    Attends Religious Services: 1 to 4 times per year    Active Member of [employer] or Organizations: No    Attends Engineer, structural: Not on file    Marital Status:  Married   Past Surgical History:  Procedure Laterality Date   ANTERIOR CERVICAL DECOMPRESSION/DISCECTOMY FUSION 4 LEVELS N/A 08/11/2017   Procedure: Cervical three to Cervical seven Anterior Cervical discectomy with fusion/plate fixation;  Surgeon: Ditty, Loura Halt, MD;  Location: [facility] OR;  Service: Neurosurgery;  Laterality: N/A;   BACK SURGERY     HERNIA REPAIR     inguinal hernia repair in early 2000   LEG SURGERY     POSTERIOR CERVICAL FUSION/FORAMINOTOMY N/A 08/12/2018   Procedure: POSTERIOR CERVICAL FUSION WITH LATERAL MASS FIXATION CERVICAL THREE TO CERVICAL SEVEN;  Surgeon: Lisbeth Renshaw, MD;  Location: [facility] OR;  Service: Neurosurgery;  Laterality: N/A;   SHOULDER ARTHROSCOPY WITH ROTATOR CUFF REPAIR AND SUBACROMIAL DECOMPRESSION Right 02/02/2018   Procedure: RIGHT SHOULDER ARTHROSCOPY WITH SUBACROMIAL DECOMPRESSION, MINI-OPEN ROTATOR CUFF REPAIR;  Surgeon: Cammy Copa, MD;  Location: [facility] OR;  Service: Orthopedics;  Laterality: Right;   SHOULDER SURGERY Right    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   Procedure: LEFT TOTAL HIP ARTHROPLASTY ANTERIOR APPROACH;  Surgeon: Tarry Kos, MD;  Location: [facility] OR;  Service: Orthopedics;  Laterality: Left;   Past Surgical History:  Procedure Laterality Date   ANTERIOR CERVICAL DECOMPRESSION/DISCECTOMY FUSION 4 LEVELS N/A 08/11/2017   Procedure: Cervical three to Cervical seven Anterior Cervical discectomy with fusion/plate fixation;  Surgeon: Ditty, Loura Halt, MD;  Location: [facility] OR;  Service: Neurosurgery;  Laterality: N/A;   BACK SURGERY     HERNIA REPAIR     inguinal hernia repair in early 2000   LEG SURGERY     POSTERIOR CERVICAL FUSION/FORAMINOTOMY N/A 08/12/2018   Procedure: POSTERIOR CERVICAL FUSION WITH LATERAL MASS FIXATION CERVICAL THREE TO CERVICAL SEVEN;  Surgeon: Lisbeth Renshaw, MD;  Location: [facility] OR;  Service: Neurosurgery;  Laterality: N/A;   SHOULDER ARTHROSCOPY WITH ROTATOR CUFF REPAIR AND SUBACROMIAL  DECOMPRESSION Right 02/02/2018   Procedure: RIGHT SHOULDER ARTHROSCOPY WITH SUBACROMIAL DECOMPRESSION, MINI-OPEN ROTATOR CUFF REPAIR;  Surgeon: Cammy Copa, MD;  Location: [facility] OR;  Service: Orthopedics;  Laterality: Right;   SHOULDER SURGERY Right    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   Procedure: LEFT TOTAL HIP ARTHROPLASTY ANTERIOR APPROACH;  Surgeon: Tarry Kos, MD;  Location: [facility] OR;  Service: Orthopedics;  Laterality: Left;   Past Medical History:  Diagnosis Date   Arthritis    Cerebral palsy (HCC)    Cervical radiculopathy    H/O umbilical hernia repair 2001   Pre-diabetes    Scoliosis    Spondylosis of cervical spine    BP (!) 151/90   Pulse 69   Ht 5\' 4"  (1.626 m)   Wt 115 lb (52.2 kg)   SpO2 99%   BMI 19.74 kg/m  Opioid Risk Score:   Fall Risk Score:  `1  Depression screen [facility] 2/9     11/05/2023   10:58 AM 09/07/2023    1:30 PM 07/06/2023    2:45 PM 06/12/2023   10:28 AM 05/29/2023    9:46 AM 05/29/2023    9:45 AM 05/26/2023   11:14 AM  Depression screen PHQ 2/9  Decreased Interest 0 1 1 0 3 3 0  Down, Depressed, Hopeless 0 1 1 0 1 1 0  PHQ - 2 Score 0 2 2 0 4 4 0  Altered sleeping    0 3 3   Tired, decreased energy    0 1 1   Change in appetite    0 2 2   Feeling bad or failure about yourself     0 1 1   Trouble concentrating    0 1 1   Moving slowly or fidgety/restless    0 1 1   Suicidal thoughts    0 0 0   PHQ-9 Score    0 13 13   Difficult doing work/chores    Not difficult at all Extremely dIfficult Extremely dIfficult      Review of Systems  Musculoskeletal:  Positive for gait problem.       Left hip and right shoulder  Neurological:  Positive for headaches.  All other systems reviewed and are negative.      Objective:   Physical Exam Vitals and nursing note reviewed.  Constitutional:      Appearance: Normal appearance.  Cardiovascular:     Rate and Rhythm: Normal rate and regular rhythm.     Pulses: Normal pulses.     Heart  sounds: Normal heart sounds.  Pulmonary:     Effort: Pulmonary effort is normal.     Breath sounds: Normal breath sounds.  Musculoskeletal:     Comments: Normal Muscle Bulk and Muscle Testing Reveals:  Upper Extremities: Full ROM and Muscle Strength 5/5 Bilateral AC Joint Tenderness Lumbar Paraspinal Tenderness: L-4-L-5 Left Greater Trochanter Tenderness  Lower Extremities: Full ROM and Muscle Strength 5/5 Wearing Left AFO  Arises from Table slowly using cane for support Antalgic  Gait     Skin:    General: Skin is warm and dry.  Neurological:     Mental Status: He is alert and oriented to person, place, and time.  Psychiatric:        Mood and Affect: Mood normal.        Behavior: Behavior normal.         Assessment & Plan:  Cervicalgia/ Cervical Radiculitis: No complaints today. Continue HEP as Tolerated. Continue to Monitor. Continue Gabapentin. Continue to Monitor. 11/05/2023 Chronic Bilateral;  Shoulder Pain:  Continue HEP as Tolerated. Continue to Monitor. Ortho Following. 11/05/2023 Failed Back Syndrome: Continue HEP as Tolerated. Continue to Monitor. 11/05/2023 Chronic Bilateral Low Back Pain without Sciatica: Continue HEP as Tolerated. Continue current medication regimen. Continue to Monitor. 11/05/2023 Chronic Left Hip Pain: S/P On 09/10/2020: Dr Roda Shutters LEFT TOTAL HIP ARTHROPLASTY ANTERIOR APPROACH Continue HEP as Tolerated. Continue current medication regimen. Continue to Monitor. 11/05/2023 Chronic Left knee Pain: Continue HEP as Tolerated. Continue to Monitor. No complaints today. Continue to Monitor. 11/05/2023 Left Foot Drop: Wearing AFO: [employer] Following. Continue to Monitor. 11/05/2023 Chronic Pain Syndrome: Refilled Oxycodone 10/325 mg one tablet every 4 hours as needed for pain #180.  We will continue the opioid monitoring program, this consists of regular clinic visits, examinations, urine drug screen, pill  counts as well as use of [place] Controlled Substance  Reporting system. A 12 month History has been reviewed on the [place] Controlled Substance Reporting System on 11/05/2023   F/U in 1 month

## 2023-11-05 NOTE — Telephone Encounter (Signed)
  Follow up Call-     11/04/2023    8:13 AM  Call back number  Post procedure Call Back phone  # [phone]  Permission to leave phone message Yes     Patient questions:  Do you have a fever, pain , or abdominal swelling? No. Pain Score  0 *  Have you tolerated food without any problems? Yes.    Have you been able to return to your normal activities? Yes.    Do you have any questions about your discharge instructions: Diet   No. Medications  No. Follow up visit  No.  Do you have questions or concerns about your Care? No.  Actions: * If pain score is 4 or above: No action needed, pain <4.

## 2023-11-06 ENCOUNTER — Ambulatory Visit: Payer: 59 | Admitting: Registered Nurse

## 2023-11-06 LAB — SURGICAL PATHOLOGY

## 2023-11-09 ENCOUNTER — Ambulatory Visit: Payer: 59 | Admitting: Registered Nurse

## 2023-11-10 ENCOUNTER — Other Ambulatory Visit: Payer: Self-pay

## 2023-11-10 ENCOUNTER — Encounter: Payer: Self-pay | Admitting: Gastroenterology

## 2023-11-10 DIAGNOSIS — A048 Other specified bacterial intestinal infections: Secondary | ICD-10-CM

## 2023-11-10 DIAGNOSIS — B3781 Candidal esophagitis: Secondary | ICD-10-CM

## 2023-11-10 DIAGNOSIS — D509 Iron deficiency anemia, unspecified: Secondary | ICD-10-CM

## 2023-11-10 MED ORDER — DOXYCYCLINE HYCLATE 100 MG PO CAPS: 100.0000 mg | ORAL_CAPSULE | Freq: Two times a day (BID) | ORAL | 0 refills | Status: DC

## 2023-11-10 MED ORDER — FLUCONAZOLE 100 MG PO TABS: ORAL_TABLET | ORAL | 0 refills | Status: DC

## 2023-11-10 MED ORDER — BISMUTH SUBSALICYLATE 262 MG PO CHEW: 524.0000 mg | CHEWABLE_TABLET | Freq: Three times a day (TID) | ORAL | 0 refills | Status: DC

## 2023-11-10 MED ORDER — OMEPRAZOLE 20 MG PO CPDR
20.0000 mg | DELAYED_RELEASE_CAPSULE | Freq: Two times a day (BID) | ORAL | 0 refills | Status: DC
Start: 2023-11-10 — End: 2024-01-26

## 2023-11-10 MED ORDER — METRONIDAZOLE 250 MG PO TABS: 250.0000 mg | ORAL_TABLET | Freq: Four times a day (QID) | ORAL | 0 refills | Status: DC

## 2023-11-10 NOTE — Progress Notes (Signed)
 Philip Bates

## 2023-11-10 NOTE — Progress Notes (Signed)
Philip Bates, The biopsies from the recent upper GI Endoscopy were notable for H. Pylori gastritis.  This is the likely source of your iron deficiency anemia.  We will plan on treating with quad therapy as below.   1) Omeprazole 20 mg 2 times a day x 14 d 2) Pepto Bismol 2 tabs (262 mg each) 4 times a day x 14 d 3) Metronidazole 250 mg 4 times a day x 14 d 4) doxycycline 100 mg 2 times a day x 14 d  After 14 days, ok to stop omeprazole.  4 weeks after treatment completed, check H. Pylori stool antigen to confirm eradication (must be off acid suppression therapy for 2 weeks prior to specimen submission)  Dx: H. Pylori gastritis  In addition, biopsies of your esophagus showed evidence of a fungal infection called candidiasis.  Candida is a very common fungus that is found in various parts of the body.  Typically it does not cause an infection.  Typical symptoms of esophageal candidiasis include difficulty swallowing (sensation that food is stuck in the chest) or pain with swallowing. Risk factors for getting esophageal candidiasis include taking medications that affect the immune system such as steroids (systemic, as well as inhaled or intranasal), chemotherapy or other medications to treat autoimmune disorders.  Immunodeficiency from other causes such as HIV can also increase the risk of esophageal candidiasis.  Occasionally, patients can have candidiasis without any of these risk factors.  I would recommend we treat your candidiasis with 3 weeks of oral fluconazole.  I would recommend you take the fluconazole after you have completed the therapy for the H. pylori infection.  I would also recommend we check an HIV test to ensure that this is not the underlying reason for your candidiasis, given that you do not appear to take any immune suppressing medications.  Philip Bates, Can you order an HIV screening antibody test and also place a prescription for fluconazole 200 mg? Take 2 tabs on day 1, then 1 tab  daily for 13 days #15 RF 0 Again, fluconazole should be started after completing the 2 weeks of H. pylori therapy.

## 2023-11-16 ENCOUNTER — Other Ambulatory Visit: Payer: 59

## 2023-11-16 DIAGNOSIS — D509 Iron deficiency anemia, unspecified: Secondary | ICD-10-CM

## 2023-11-16 DIAGNOSIS — A048 Other specified bacterial intestinal infections: Secondary | ICD-10-CM

## 2023-11-16 DIAGNOSIS — B3781 Candidal esophagitis: Secondary | ICD-10-CM

## 2023-11-17 LAB — HIV ANTIBODY (ROUTINE TESTING W REFLEX): HIV 1&2 Ab, 4th Generation: NONREACTIVE

## 2023-11-18 ENCOUNTER — Encounter: Payer: Self-pay | Admitting: Gastroenterology

## 2023-11-18 NOTE — Progress Notes (Signed)
Philip Bates,  The test for HIV was negative, meaning you do not have HIV.  I don't recommend any further testing for immunodeficiency at this time.

## 2023-12-01 NOTE — Progress Notes (Signed)
 Subjective:    Patient ID: Philip Bates, male    DOB: 05-22-1961, 63 y.o.   MRN: [id number]  HPI: Philip Bates is a 63 y.o. male who returns for follow up appointment for chronic pain and medication refill. He states his pain is located in his bilateral shoulders, lower back and left hip. He rates his pain 8. His current exercise regime is walking and performing stretching exercises.  Philip Bates Morphine equivalent is 90.00 MME.   Last UDS was Performed on 09/07/2023, it was consistent.      Pain Inventory Average Pain 8 Pain Right Now 8 My pain is intermittent, constant, sharp, burning, dull, stabbing, tingling, and aching  In the last 24 hours, has pain interfered with the following? General activity 9 Relation with others 7 Enjoyment of life 7 What TIME of day is your pain at its worst? varies Sleep (in general) Poor  Pain is worse with: walking, bending, sitting, standing, and some activites Pain improves with: rest and medication Relief from Meds: 9  Family History  Problem Relation Age of Onset   Esophageal cancer Father    Cancer Father        esophagus   Diabetes Brother    Diabetes Paternal Uncle    Colon cancer Neg Hx    Rectal cancer Neg Hx    Stomach cancer Neg Hx    Social History   Socioeconomic History   Marital status: Married    Spouse name: Not on file   Number of children: Not on file   Years of education: Not on file   Highest education level: Some college, no degree  Occupational History   Not on file  Tobacco Use   Smoking status: Every Day    Current packs/day: 1.00    Average packs/day: 1 pack/day for 35.0 years (35.0 ttl pk-yrs)    Types: Cigarettes   Smokeless tobacco: Never  Vaping Use   Vaping status: Never Used  Substance and Sexual Activity   Alcohol use: Never   Drug use: Never   Sexual activity: Yes  Other Topics Concern   Not on file  Social History Narrative   ** Merged History Encounter **       Social Drivers of  Health   Financial Resource Strain: Low Risk  (08/17/2023)   Overall Financial Resource Strain (CARDIA)    Difficulty of Paying Living Expenses: Not very hard  Recent Concern: Financial Resource Strain - Medium Risk (05/28/2023)   Overall Financial Resource Strain (CARDIA)    Difficulty of Paying Living Expenses: Somewhat hard  Food Insecurity: Food Insecurity Present (08/17/2023)   Hunger Vital Sign    Worried About Running Out of Food in the Last Year: Sometimes true    Ran Out of Food in the Last Year: Sometimes true  Transportation Needs: Unmet Transportation Needs (08/17/2023)   PRAPARE - Transportation    Lack of Transportation (Medical): No    Lack of Transportation (Non-Medical): Yes  Physical Activity: Insufficiently Active (08/17/2023)   Exercise Vital Sign    Days of Exercise per Week: 3 days    Minutes of Exercise per Session: 30 min  Stress: Stress Concern Present (08/17/2023)   [employer] of Occupational Health - Occupational Stress Questionnaire    Feeling of Stress : To some extent  Social Connections: Socially Integrated (08/17/2023)   Social Connection and Isolation Panel [NHANES]    Frequency of Communication with Friends and Family: Three times a week    Frequency  of Social Gatherings with Friends and Family: Once a week    Attends Religious Services: More than 4 times per year    Active Member of [employer] or Organizations: Yes    Attends Engineer, structural: More than 4 times per year    Marital Status: Married  Recent Concern: Social Connections - Moderately Isolated (05/28/2023)   Social Connection and Isolation Panel [NHANES]    Frequency of Communication with Friends and Family: Once a week    Frequency of Social Gatherings with Friends and Family: Once a week    Attends Religious Services: 1 to 4 times per year    Active Member of [employer] or Organizations: No    Attends Engineer, structural: Not on file    Marital Status: Married    Past Surgical History:  Procedure Laterality Date   ANTERIOR CERVICAL DECOMPRESSION/DISCECTOMY FUSION 4 LEVELS N/A 08/11/2017   Procedure: Cervical three to Cervical seven Anterior Cervical discectomy with fusion/plate fixation;  Surgeon: Ditty, Loura Halt, MD;  Location: [facility] OR;  Service: Neurosurgery;  Laterality: N/A;   BACK SURGERY     HERNIA REPAIR     inguinal hernia repair in early 2000   LEG SURGERY     POSTERIOR CERVICAL FUSION/FORAMINOTOMY N/A 08/12/2018   Procedure: POSTERIOR CERVICAL FUSION WITH LATERAL MASS FIXATION CERVICAL THREE TO CERVICAL SEVEN;  Surgeon: Lisbeth Renshaw, MD;  Location: [facility] OR;  Service: Neurosurgery;  Laterality: N/A;   SHOULDER ARTHROSCOPY WITH ROTATOR CUFF REPAIR AND SUBACROMIAL DECOMPRESSION Right 02/02/2018   Procedure: RIGHT SHOULDER ARTHROSCOPY WITH SUBACROMIAL DECOMPRESSION, MINI-OPEN ROTATOR CUFF REPAIR;  Surgeon: Cammy Copa, MD;  Location: [facility] OR;  Service: Orthopedics;  Laterality: Right;   SHOULDER SURGERY Right    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   Procedure: LEFT TOTAL HIP ARTHROPLASTY ANTERIOR APPROACH;  Surgeon: Tarry Kos, MD;  Location: [facility] OR;  Service: Orthopedics;  Laterality: Left;   Past Surgical History:  Procedure Laterality Date   ANTERIOR CERVICAL DECOMPRESSION/DISCECTOMY FUSION 4 LEVELS N/A 08/11/2017   Procedure: Cervical three to Cervical seven Anterior Cervical discectomy with fusion/plate fixation;  Surgeon: Ditty, Loura Halt, MD;  Location: [facility] OR;  Service: Neurosurgery;  Laterality: N/A;   BACK SURGERY     HERNIA REPAIR     inguinal hernia repair in early 2000   LEG SURGERY     POSTERIOR CERVICAL FUSION/FORAMINOTOMY N/A 08/12/2018   Procedure: POSTERIOR CERVICAL FUSION WITH LATERAL MASS FIXATION CERVICAL THREE TO CERVICAL SEVEN;  Surgeon: Lisbeth Renshaw, MD;  Location: [facility] OR;  Service: Neurosurgery;  Laterality: N/A;   SHOULDER ARTHROSCOPY WITH ROTATOR CUFF REPAIR AND SUBACROMIAL DECOMPRESSION  Right 02/02/2018   Procedure: RIGHT SHOULDER ARTHROSCOPY WITH SUBACROMIAL DECOMPRESSION, MINI-OPEN ROTATOR CUFF REPAIR;  Surgeon: Cammy Copa, MD;  Location: [facility] OR;  Service: Orthopedics;  Laterality: Right;   SHOULDER SURGERY Right    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   Procedure: LEFT TOTAL HIP ARTHROPLASTY ANTERIOR APPROACH;  Surgeon: Tarry Kos, MD;  Location: [facility] OR;  Service: Orthopedics;  Laterality: Left;   Past Medical History:  Diagnosis Date   Arthritis    Cerebral palsy (HCC)    Cervical radiculopathy    H/O umbilical hernia repair 2001   Pre-diabetes    Scoliosis    Spondylosis of cervical spine    There were no vitals taken for this visit.  Opioid Risk Score:   Fall Risk Score:  `1  Depression screen [facility] 2/9     11/05/2023   10:58  AM 09/07/2023    1:30 PM 07/06/2023    2:45 PM 06/12/2023   10:28 AM 05/29/2023    9:46 AM 05/29/2023    9:45 AM 05/26/2023   11:14 AM  Depression screen PHQ 2/9  Decreased Interest 0 1 1 0 3 3 0  Down, Depressed, Hopeless 0 1 1 0 1 1 0  PHQ - 2 Score 0 2 2 0 4 4 0  Altered sleeping    0 3 3   Tired, decreased energy    0 1 1   Change in appetite    0 2 2   Feeling bad or failure about yourself     0 1 1   Trouble concentrating    0 1 1   Moving slowly or fidgety/restless    0 1 1   Suicidal thoughts    0 0 0   PHQ-9 Score    0 13 13   Difficult doing work/chores    Not difficult at all Extremely dIfficult Extremely dIfficult     Review of Systems  Musculoskeletal:  Positive for back pain and neck pain.       Pain in both shoulders, left hip pain  All other systems reviewed and are negative.      Objective:   Physical Exam Vitals and nursing note reviewed.  Constitutional:      Appearance: Normal appearance.  Cardiovascular:     Rate and Rhythm: Normal rate and regular rhythm.     Pulses: Normal pulses.     Heart sounds: Normal heart sounds.  Pulmonary:     Effort: Pulmonary effort is normal.     Breath  sounds: Normal breath sounds.  Musculoskeletal:     Comments: Normal Muscle Bulk and Muscle Testing Reveals:  Upper Extremities: Full ROM and Muscle Strength 5/5  Lumbar Paraspinal Tenderness: L-4-L-5 Left Greater Trochanter Tenderness Lower Extremities: Right: Full ROM and Muscle Strength 5/5 Left Lower Extremity: Decreased ROM and Muscle Strength 5/5 Left Lower Extremity Flexion Produces Pain into His Left Hip Wearing Left AFO Arises from Table slowly using cane for support Antalgic Gait     Skin:    General: Skin is warm and dry.  Neurological:     Mental Status: He is alert and oriented to person, place, and time.  Psychiatric:        Mood and Affect: Mood normal.        Behavior: Behavior normal.         Assessment & Plan:  Cervicalgia/ Cervical Radiculitis: No complaints today. Continue HEP as Tolerated. Continue to Monitor. Continue Gabapentin. Continue to Monitor. 12/02/2023 Chronic Bilateral;  Shoulder Pain:  Continue HEP as Tolerated. Continue to Monitor. Ortho Following. 12/02/2023 Failed Back Syndrome: Continue HEP as Tolerated. Continue to Monitor. 12/02/2023 Chronic Bilateral Low Back Pain without Sciatica: Continue HEP as Tolerated. Continue current medication regimen. Continue to Monitor. 01226/2025 Chronic Left Hip Pain: S/P On 09/10/2020: Dr Roda Shutters LEFT TOTAL HIP ARTHROPLASTY ANTERIOR APPROACH Continue HEP as Tolerated. Continue current medication regimen. Continue to Monitor. 12/02/2023 Chronic Left knee Pain: Continue HEP as Tolerated. Continue to Monitor. No complaints today. Continue to Monitor. 12/02/2023 Left Foot Drop: Wearing AFO: [employer] Following. Continue to Monitor. 12/02/2023 Chronic Pain Syndrome: Refilled Oxycodone 10/325 mg one tablet every 4 hours as needed for pain #180.  We will continue the opioid monitoring program, this consists of regular clinic visits, examinations, urine drug screen, pill counts as well as use of [place] Controlled  Substance  Reporting system. A 12 month History has been reviewed on the [place] Controlled Substance Reporting System on 12/02/2023   F/U in 1 month

## 2023-12-02 ENCOUNTER — Encounter: Payer: Self-pay | Admitting: Registered Nurse

## 2023-12-02 ENCOUNTER — Encounter: Payer: 59 | Attending: Physical Medicine and Rehabilitation | Admitting: Registered Nurse

## 2023-12-02 VITALS — BP 125/72 | HR 77 | Ht 64.0 in | Wt 116.0 lb

## 2023-12-02 DIAGNOSIS — G8929 Other chronic pain: Secondary | ICD-10-CM | POA: Insufficient documentation

## 2023-12-02 DIAGNOSIS — M25552 Pain in left hip: Secondary | ICD-10-CM | POA: Diagnosis not present

## 2023-12-02 DIAGNOSIS — M25511 Pain in right shoulder: Secondary | ICD-10-CM | POA: Insufficient documentation

## 2023-12-02 DIAGNOSIS — M545 Low back pain, unspecified: Secondary | ICD-10-CM | POA: Diagnosis not present

## 2023-12-02 DIAGNOSIS — M25512 Pain in left shoulder: Secondary | ICD-10-CM | POA: Insufficient documentation

## 2023-12-02 DIAGNOSIS — Z79891 Long term (current) use of opiate analgesic: Secondary | ICD-10-CM | POA: Diagnosis not present

## 2023-12-02 DIAGNOSIS — M961 Postlaminectomy syndrome, not elsewhere classified: Secondary | ICD-10-CM | POA: Insufficient documentation

## 2023-12-02 DIAGNOSIS — G894 Chronic pain syndrome: Secondary | ICD-10-CM | POA: Diagnosis not present

## 2023-12-02 DIAGNOSIS — Z5181 Encounter for therapeutic drug level monitoring: Secondary | ICD-10-CM | POA: Insufficient documentation

## 2023-12-02 MED ORDER — OXYCODONE-ACETAMINOPHEN 10-325 MG PO TABS: 1.0000 | ORAL_TABLET | ORAL | 0 refills | Status: DC | PRN

## 2023-12-02 MED ORDER — GABAPENTIN 400 MG PO CAPS: 400.0000 mg | ORAL_CAPSULE | Freq: Three times a day (TID) | ORAL | 3 refills | Status: DC

## 2023-12-04 ENCOUNTER — Telehealth: Payer: Self-pay | Admitting: Hematology and Oncology

## 2023-12-23 ENCOUNTER — Other Ambulatory Visit: Payer: Self-pay

## 2023-12-23 DIAGNOSIS — A048 Other specified bacterial intestinal infections: Secondary | ICD-10-CM

## 2023-12-23 NOTE — Progress Notes (Signed)
 Marland Kitchen

## 2023-12-28 NOTE — Progress Notes (Deleted)
 Subjective:    Patient ID: Philip Bates, male    DOB: 04-04-1961, 63 y.o.   MRN: [id number]  HPI   Pain Inventory Average Pain {NUMBERS; 0-10:5044} Pain Right Now {NUMBERS; 0-10:5044} My pain is {PAIN DESCRIPTION:21022940}  In the last 24 hours, has pain interfered with the following? General activity {NUMBERS; 0-10:5044} Relation with others {NUMBERS; 0-10:5044} Enjoyment of life {NUMBERS; 0-10:5044} What TIME of day is your pain at its worst? {time of day:24191} Sleep (in general) {BHH GOOD/FAIR/POOR:22877}  Pain is worse with: {ACTIVITIES:21022942} Pain improves with: {PAIN IMPROVES WUJW:11914782} Relief from Meds: {NUMBERS; 0-10:5044}  Family History  Problem Relation Age of Onset   Esophageal cancer Father    Cancer Father        esophagus   Diabetes Brother    Diabetes Paternal Uncle    Colon cancer Neg Hx    Rectal cancer Neg Hx    Stomach cancer Neg Hx    Social History   Socioeconomic History   Marital status: Married    Spouse name: Not on file   Number of children: Not on file   Years of education: Not on file   Highest education level: Some college, no degree  Occupational History   Not on file  Tobacco Use   Smoking status: Every Day    Current packs/day: 1.00    Average packs/day: 1 pack/day for 35.0 years (35.0 ttl pk-yrs)    Types: Cigarettes   Smokeless tobacco: Never  Vaping Use   Vaping status: Never Used  Substance and Sexual Activity   Alcohol use: Never   Drug use: Never   Sexual activity: Yes  Other Topics Concern   Not on file  Social History Narrative   ** Merged History Encounter **       Social Drivers of Health   Financial Resource Strain: Low Risk  (08/17/2023)   Overall Financial Resource Strain (CARDIA)    Difficulty of Paying Living Expenses: Not very hard  Recent Concern: Financial Resource Strain - Medium Risk (05/28/2023)   Overall Financial Resource Strain (CARDIA)    Difficulty of Paying Living Expenses:  Somewhat hard  Food Insecurity: Food Insecurity Present (08/17/2023)   Hunger Vital Sign    Worried About Running Out of Food in the Last Year: Sometimes true    Ran Out of Food in the Last Year: Sometimes true  Transportation Needs: Unmet Transportation Needs (08/17/2023)   PRAPARE - Transportation    Lack of Transportation (Medical): No    Lack of Transportation (Non-Medical): Yes  Physical Activity: Insufficiently Active (08/17/2023)   Exercise Vital Sign    Days of Exercise per Week: 3 days    Minutes of Exercise per Session: 30 min  Stress: Stress Concern Present (08/17/2023)   [employer] of Occupational Health - Occupational Stress Questionnaire    Feeling of Stress : To some extent  Social Connections: Socially Integrated (08/17/2023)   Social Connection and Isolation Panel [NHANES]    Frequency of Communication with Friends and Family: Three times a week    Frequency of Social Gatherings with Friends and Family: Once a week    Attends Religious Services: More than 4 times per year    Active Member of [employer] or Organizations: Yes    Attends Banker Meetings: More than 4 times per year    Marital Status: Married  Recent Concern: Social Connections - Moderately Isolated (05/28/2023)   Social Connection and Isolation Panel [NHANES]    Frequency of Communication  with Friends and Family: Once a week    Frequency of Social Gatherings with Friends and Family: Once a week    Attends Religious Services: 1 to 4 times per year    Active Member of [employer] or Organizations: No    Attends Engineer, structural: Not on file    Marital Status: Married   Past Surgical History:  Procedure Laterality Date   ANTERIOR CERVICAL DECOMPRESSION/DISCECTOMY FUSION 4 LEVELS N/A 08/11/2017   Procedure: Cervical three to Cervical seven Anterior Cervical discectomy with fusion/plate fixation;  Surgeon: Ditty, Loura Halt, MD;  Location: [facility] OR;  Service: Neurosurgery;   Laterality: N/A;   BACK SURGERY     HERNIA REPAIR     inguinal hernia repair in early 2000   LEG SURGERY     POSTERIOR CERVICAL FUSION/FORAMINOTOMY N/A 08/12/2018   Procedure: POSTERIOR CERVICAL FUSION WITH LATERAL MASS FIXATION CERVICAL THREE TO CERVICAL SEVEN;  Surgeon: Lisbeth Renshaw, MD;  Location: [facility] OR;  Service: Neurosurgery;  Laterality: N/A;   SHOULDER ARTHROSCOPY WITH ROTATOR CUFF REPAIR AND SUBACROMIAL DECOMPRESSION Right 02/02/2018   Procedure: RIGHT SHOULDER ARTHROSCOPY WITH SUBACROMIAL DECOMPRESSION, MINI-OPEN ROTATOR CUFF REPAIR;  Surgeon: Cammy Copa, MD;  Location: [facility] OR;  Service: Orthopedics;  Laterality: Right;   SHOULDER SURGERY Right    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   Procedure: LEFT TOTAL HIP ARTHROPLASTY ANTERIOR APPROACH;  Surgeon: Tarry Kos, MD;  Location: [facility] OR;  Service: Orthopedics;  Laterality: Left;   Past Surgical History:  Procedure Laterality Date   ANTERIOR CERVICAL DECOMPRESSION/DISCECTOMY FUSION 4 LEVELS N/A 08/11/2017   Procedure: Cervical three to Cervical seven Anterior Cervical discectomy with fusion/plate fixation;  Surgeon: Ditty, Loura Halt, MD;  Location[facility] OR;  Service: Neurosurgery;  Laterality: N/A;   BACK SURGERY     HERNIA REPAIR     inguinal hernia repair in early 2000   LEG SURGERY     POSTERIOR CERVICAL FUSION/FORAMINOTOMY N/A 08/12/2018   Procedure: POSTERIOR CERVICAL FUSION WITH LATERAL MASS FIXATION CERVICAL THREE TO CERVICAL SEVEN;  Surgeon: Lisbeth Renshaw, MD;  Location: [facility] OR;  Service: Neurosurgery;  Laterality: N/A;   SHOULDER ARTHROSCOPY WITH ROTATOR CUFF REPAIR AND SUBACROMIAL DECOMPRESSION Right 02/02/2018   Procedure: RIGHT SHOULDER ARTHROSCOPY WITH SUBACROMIAL DECOMPRESSION, MINI-OPEN ROTATOR CUFF REPAIR;  Surgeon: Cammy Copa, MD;  Location: [facility] OR;  Service: Orthopedics;  Laterality: Right;   SHOULDER SURGERY Right    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  Opioid Risk Score:   Fall Risk Score:  `1  Depression screen PHQ 2/9     12/02/2023    1:45 PM 11/05/2023   10:58 AM 09/07/2023    1:30 PM 07/06/2023    2:45 PM 06/12/2023   10:28 AM 05/29/2023    9:46 AM 05/29/2023    9:45 AM  Depression screen PHQ 2/9  Decreased Interest 0 0 1 1 0 3 3  Down, Depressed, Hopeless 0 0 1 1 0 1 1  PHQ - 2 Score 0 0 2 2 0 4 4  Altered sleeping     0 3 3  Tired, decreased energy     0 1 1  Change in appetite     0 2 2  Feeling bad or failure about yourself      0 1 1  Trouble concentrating     0 1 1  Moving slowly or fidgety/restless     0 1 1  Suicidal thoughts     0 0 0  PHQ-9 Score     0 13 13  Difficult doing work/chores     Not difficult at all Extremely dIfficult Extremely dIfficult    Review of Systems     Objective:   Physical Exam        Assessment & Plan:

## 2023-12-30 ENCOUNTER — Encounter: Payer: 59 | Admitting: Registered Nurse

## 2023-12-31 ENCOUNTER — Inpatient Hospital Stay: Payer: 59 | Attending: Oncology

## 2023-12-31 ENCOUNTER — Other Ambulatory Visit: Payer: Self-pay | Admitting: Hematology and Oncology

## 2023-12-31 DIAGNOSIS — D509 Iron deficiency anemia, unspecified: Secondary | ICD-10-CM

## 2024-01-04 ENCOUNTER — Encounter: Payer: Self-pay | Admitting: Registered Nurse

## 2024-01-04 ENCOUNTER — Telehealth: Payer: Self-pay | Admitting: Hematology and Oncology

## 2024-01-04 ENCOUNTER — Encounter: Attending: Physical Medicine and Rehabilitation | Admitting: Registered Nurse

## 2024-01-04 VITALS — BP 122/76 | HR 71 | Ht 64.0 in | Wt 116.0 lb

## 2024-01-04 DIAGNOSIS — G894 Chronic pain syndrome: Secondary | ICD-10-CM | POA: Diagnosis not present

## 2024-01-04 DIAGNOSIS — Z79891 Long term (current) use of opiate analgesic: Secondary | ICD-10-CM | POA: Insufficient documentation

## 2024-01-04 DIAGNOSIS — M961 Postlaminectomy syndrome, not elsewhere classified: Secondary | ICD-10-CM | POA: Diagnosis not present

## 2024-01-04 DIAGNOSIS — Z5181 Encounter for therapeutic drug level monitoring: Secondary | ICD-10-CM | POA: Diagnosis not present

## 2024-01-04 DIAGNOSIS — M542 Cervicalgia: Secondary | ICD-10-CM | POA: Insufficient documentation

## 2024-01-04 DIAGNOSIS — G8929 Other chronic pain: Secondary | ICD-10-CM | POA: Diagnosis not present

## 2024-01-04 DIAGNOSIS — M545 Low back pain, unspecified: Secondary | ICD-10-CM | POA: Diagnosis not present

## 2024-01-04 DIAGNOSIS — M25512 Pain in left shoulder: Secondary | ICD-10-CM | POA: Insufficient documentation

## 2024-01-04 DIAGNOSIS — M25552 Pain in left hip: Secondary | ICD-10-CM | POA: Diagnosis not present

## 2024-01-04 DIAGNOSIS — M25511 Pain in right shoulder: Secondary | ICD-10-CM | POA: Insufficient documentation

## 2024-01-04 MED ORDER — OXYCODONE-ACETAMINOPHEN 10-325 MG PO TABS: 1.0000 | ORAL_TABLET | ORAL | 0 refills | Status: DC | PRN

## 2024-01-04 NOTE — Progress Notes (Signed)
 Subjective:    Patient ID: Philip Bates, male    DOB: 03/28/1961, 63 y.o.   MRN: [id number]  HPI: Philip Bates is a 63 y.o. male who returns for follow up appointment for chronic pain and medication refill. He states his pain is located in his neck, bilateral shoulders, lower back and left hip pain. He rates his pain 8. His current exercise regime is walking and performing stretching exercises.  Mr. Briner Morphine equivalent is 90.00 MME.   UDS ordered today.      Pain Inventory Average Pain 8 Pain Right Now 8 My pain is sharp, burning, dull, stabbing, tingling, and aching  In the last 24 hours, has pain interfered with the following? General activity 9 Relation with others 0 Enjoyment of life 0 What TIME of day is your pain at its worst? varies Sleep (in general) Poor  Pain is worse with: walking, bending, sitting, standing, and some activites Pain improves with: therapy/exercise and medication Relief from Meds: 8  Family History  Problem Relation Age of Onset   Esophageal cancer Father    Cancer Father        esophagus   Diabetes Brother    Diabetes Paternal Uncle    Colon cancer Neg Hx    Rectal cancer Neg Hx    Stomach cancer Neg Hx    Social History   Socioeconomic History   Marital status: Married    Spouse name: Not on file   Number of children: Not on file   Years of education: Not on file   Highest education level: Some college, no degree  Occupational History   Not on file  Tobacco Use   Smoking status: Every Day    Current packs/day: 1.00    Average packs/day: 1 pack/day for 35.0 years (35.0 ttl pk-yrs)    Types: Cigarettes   Smokeless tobacco: Never  Vaping Use   Vaping status: Never Used  Substance and Sexual Activity   Alcohol use: Never   Drug use: Never   Sexual activity: Yes  Other Topics Concern   Not on file  Social History Narrative   ** Merged History Encounter **       Social Drivers of Health   Financial Resource Strain: Low  Risk  (08/17/2023)   Overall Financial Resource Strain (CARDIA)    Difficulty of Paying Living Expenses: Not very hard  Recent Concern: Financial Resource Strain - Medium Risk (05/28/2023)   Overall Financial Resource Strain (CARDIA)    Difficulty of Paying Living Expenses: Somewhat hard  Food Insecurity: Food Insecurity Present (08/17/2023)   Hunger Vital Sign    Worried About Running Out of Food in the Last Year: Sometimes true    Ran Out of Food in the Last Year: Sometimes true  Transportation Needs: Unmet Transportation Needs (08/17/2023)   PRAPARE - Transportation    Lack of Transportation (Medical): No    Lack of Transportation (Non-Medical): Yes  Physical Activity: Insufficiently Active (08/17/2023)   Exercise Vital Sign    Days of Exercise per Week: 3 days    Minutes of Exercise per Session: 30 min  Stress: Stress Concern Present (08/17/2023)   [employer] of Occupational Health - Occupational Stress Questionnaire    Feeling of Stress : To some extent  Social Connections: Socially Integrated (08/17/2023)   Social Connection and Isolation Panel [NHANES]    Frequency of Communication with Friends and Family: Three times a week    Frequency of Social Gatherings with Friends and  Family: Once a week    Attends Religious Services: More than 4 times per year    Active Member of Clubs or Organizations: Yes    Attends Engineer, structural: More than 4 times per year    Marital Status: Married  Recent Concern: Social Connections - Moderately Isolated (05/28/2023)   Social Connection and Isolation Panel [NHANES]    Frequency of Communication with Friends and Family: Once a week    Frequency of Social Gatherings with Friends and Family: Once a week    Attends Religious Services: 1 to 4 times per year    Active Member of [employer] or Organizations: No    Attends Engineer, structural: Not on file    Marital Status: Married   Past Surgical History:  Procedure  Laterality Date   ANTERIOR CERVICAL DECOMPRESSION/DISCECTOMY FUSION 4 LEVELS N/A 08/11/2017   Procedure: Cervical three to Cervical seven Anterior Cervical discectomy with fusion/plate fixation;  Surgeon: Ditty, Loura Halt, MD;  Location: [facility] OR;  Service: Neurosurgery;  Laterality: N/A;   BACK SURGERY     HERNIA REPAIR     inguinal hernia repair in early 2000   LEG SURGERY     POSTERIOR CERVICAL FUSION/FORAMINOTOMY N/A 08/12/2018   Procedure: POSTERIOR CERVICAL FUSION WITH LATERAL MASS FIXATION CERVICAL THREE TO CERVICAL SEVEN;  Surgeon: Lisbeth Renshaw, MD;  Location: [facility] OR;  Service: Neurosurgery;  Laterality: N/A;   SHOULDER ARTHROSCOPY WITH ROTATOR CUFF REPAIR AND SUBACROMIAL DECOMPRESSION Right 02/02/2018   Procedure: RIGHT SHOULDER ARTHROSCOPY WITH SUBACROMIAL DECOMPRESSION, MINI-OPEN ROTATOR CUFF REPAIR;  Surgeon: Cammy Copa, MD;  Location: [facility] OR;  Service: Orthopedics;  Laterality: Right;   SHOULDER SURGERY Right    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   Procedure: LEFT TOTAL HIP ARTHROPLASTY ANTERIOR APPROACH;  Surgeon: Tarry Kos, MD;  Location: [facility] OR;  Service: Orthopedics;  Laterality: Left;   Past Surgical History:  Procedure Laterality Date   ANTERIOR CERVICAL DECOMPRESSION/DISCECTOMY FUSION 4 LEVELS N/A 08/11/2017   Procedure: Cervical three to Cervical seven Anterior Cervical discectomy with fusion/plate fixation;  Surgeon: Ditty, Loura Halt, MD;  Location: [facility] OR;  Service: Neurosurgery;  Laterality: N/A;   BACK SURGERY     HERNIA REPAIR     inguinal hernia repair in early 2000   LEG SURGERY     POSTERIOR CERVICAL FUSION/FORAMINOTOMY N/A 08/12/2018   Procedure: POSTERIOR CERVICAL FUSION WITH LATERAL MASS FIXATION CERVICAL THREE TO CERVICAL SEVEN;  Surgeon: Lisbeth Renshaw, MD;  Location: [facility] OR;  Service: Neurosurgery;  Laterality: N/A;   SHOULDER ARTHROSCOPY WITH ROTATOR CUFF REPAIR AND SUBACROMIAL DECOMPRESSION Right 02/02/2018   Procedure: RIGHT  SHOULDER ARTHROSCOPY WITH SUBACROMIAL DECOMPRESSION, MINI-OPEN ROTATOR CUFF REPAIR;  Surgeon: Cammy Copa, MD;  Location: [facility] OR;  Service: Orthopedics;  Laterality: Right;   SHOULDER SURGERY Right    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   Procedure: LEFT TOTAL HIP ARTHROPLASTY ANTERIOR APPROACH;  Surgeon: Tarry Kos, MD;  Location: [facility] OR;  Service: Orthopedics;  Laterality: Left;   Past Medical History:  Diagnosis Date   Arthritis    Cerebral palsy (HCC)    Cervical radiculopathy    H/O umbilical hernia repair 2001   Pre-diabetes    Scoliosis    Spondylosis of cervical spine    BP 122/76   Pulse 71   Ht 5\' 4"  (1.626 m)   Wt 116 lb (52.6 kg)   SpO2 98%   BMI 19.91 kg/m   Opioid Risk Score:   Fall Risk  Score:  `1  Depression screen [facility] 2/9     12/02/2023    1:45 PM 11/05/2023   10:58 AM 09/07/2023    1:30 PM 07/06/2023    2:45 PM 06/12/2023   10:28 AM 05/29/2023    9:46 AM 05/29/2023    9:45 AM  Depression screen PHQ 2/9  Decreased Interest 0 0 1 1 0 3 3  Down, Depressed, Hopeless 0 0 1 1 0 1 1  PHQ - 2 Score 0 0 2 2 0 4 4  Altered sleeping     0 3 3  Tired, decreased energy     0 1 1  Change in appetite     0 2 2  Feeling bad or failure about yourself      0 1 1  Trouble concentrating     0 1 1  Moving slowly or fidgety/restless     0 1 1  Suicidal thoughts     0 0 0  PHQ-9 Score     0 13 13  Difficult doing work/chores     Not difficult at all Extremely dIfficult Extremely dIfficult    Review of Systems  Musculoskeletal:  Positive for back pain and neck pain.       Bilateral shoulder pain Left hip pain  All other systems reviewed and are negative.     Objective:   Physical Exam Vitals and nursing note reviewed.  Constitutional:      Appearance: Normal appearance.  Neck:     Comments: Cervical Paraspinal Tenderness: C-5-C-6 Cardiovascular:     Rate and Rhythm: Normal rate and regular rhythm.     Pulses: Normal pulses.     Heart sounds: Normal  heart sounds.  Pulmonary:     Effort: Pulmonary effort is normal.     Breath sounds: Normal breath sounds.  Musculoskeletal:     Comments: Normal Muscle Bulk and Muscle Testing Reveals:  Upper Extremities: Full ROM and Muscle Strength 5/5 Bilateral AC Joint Tenderness Lumbar Paraspinal Tenderness: L-4-L-5 Lower Extremities: Full ROM and Muscle Strength 5/5 Arises from Table slowly using cane for support Antalgic  Gait     Skin:    General: Skin is warm and dry.  Neurological:     Mental Status: He is alert and oriented to person, place, and time.  Psychiatric:        Mood and Affect: Mood normal.        Behavior: Behavior normal.         Assessment & Plan:  Cervicalgia/ Cervical Radiculitis: Continue HEP as Tolerated. Continue to Monitor. Continue Gabapentin. Continue to Monitor. 01/04/2024 Chronic Bilateral;  Shoulder Pain:  Continue HEP as Tolerated. Continue to Monitor. Ortho Following. 01/04/2024 Failed Back Syndrome: Continue HEP as Tolerated. Continue to Monitor. 01/04/2024 Chronic Bilateral Low Back Pain without Sciatica: Continue HEP as Tolerated. Continue current medication regimen. Continue to Monitor. 03/ 31/2025 Chronic Left Hip Pain: S/P On 09/10/2020: Dr Roda Shutters LEFT TOTAL HIP ARTHROPLASTY ANTERIOR APPROACH Continue HEP as Tolerated. Continue current medication regimen. Continue to Monitor. 01/04/2024 Chronic Left knee Pain: Continue HEP as Tolerated. Continue to Monitor. No complaints today. Continue to Monitor. 01/04/2024 Left Foot Drop: Wearing AFO: [employer] Following. Continue to Monitor. 01/04/2024 Chronic Pain Syndrome: Refilled Oxycodone 10/325 mg one tablet every 4 hours as needed for pain #180.  We will continue the opioid monitoring program, this consists of regular clinic visits, examinations, urine drug screen, pill counts as well as use of [place] Controlled Substance Reporting  system. A 12 month History has been reviewed on the [place] Controlled  Substance Reporting System on 12/25/2023   F/U in 1 month

## 2024-01-05 ENCOUNTER — Inpatient Hospital Stay: Payer: 59 | Attending: Hematology and Oncology | Admitting: Hematology and Oncology

## 2024-01-05 ENCOUNTER — Inpatient Hospital Stay

## 2024-01-05 VITALS — BP 140/85 | HR 72 | Temp 98.1°F | Resp 14 | Wt 119.2 lb

## 2024-01-05 DIAGNOSIS — D500 Iron deficiency anemia secondary to blood loss (chronic): Secondary | ICD-10-CM | POA: Insufficient documentation

## 2024-01-05 DIAGNOSIS — Z7982 Long term (current) use of aspirin: Secondary | ICD-10-CM

## 2024-01-05 DIAGNOSIS — F17210 Nicotine dependence, cigarettes, uncomplicated: Secondary | ICD-10-CM | POA: Diagnosis not present

## 2024-01-05 DIAGNOSIS — K922 Gastrointestinal hemorrhage, unspecified: Secondary | ICD-10-CM | POA: Diagnosis not present

## 2024-01-05 DIAGNOSIS — D509 Iron deficiency anemia, unspecified: Secondary | ICD-10-CM

## 2024-01-05 DIAGNOSIS — D573 Sickle-cell trait: Secondary | ICD-10-CM | POA: Diagnosis not present

## 2024-01-05 LAB — CBC WITH DIFFERENTIAL (CANCER CENTER ONLY)
Abs Immature Granulocytes: 0.01 10*3/uL (ref 0.00–0.07)
Basophils Absolute: 0.1 10*3/uL (ref 0.0–0.1)
Basophils Relative: 1 %
Eosinophils Absolute: 0.3 10*3/uL (ref 0.0–0.5)
Eosinophils Relative: 6 %
HCT: 35.5 % — ABNORMAL LOW (ref 39.0–52.0)
Hemoglobin: 12.4 g/dL — ABNORMAL LOW (ref 13.0–17.0)
Immature Granulocytes: 0 %
Lymphocytes Relative: 37 %
Lymphs Abs: 1.9 10*3/uL (ref 0.7–4.0)
MCH: 28 pg (ref 26.0–34.0)
MCHC: 34.9 g/dL (ref 30.0–36.0)
MCV: 80.1 fL (ref 80.0–100.0)
Monocytes Absolute: 0.5 10*3/uL (ref 0.1–1.0)
Monocytes Relative: 9 %
Neutro Abs: 2.4 10*3/uL (ref 1.7–7.7)
Neutrophils Relative %: 47 %
Platelet Count: 184 10*3/uL (ref 150–400)
RBC: 4.43 MIL/uL (ref 4.22–5.81)
RDW: 13.1 % (ref 11.5–15.5)
WBC Count: 5.2 10*3/uL (ref 4.0–10.5)
nRBC: 0 % (ref 0.0–0.2)

## 2024-01-05 LAB — IRON AND IRON BINDING CAPACITY (CC-WL,HP ONLY)
Iron: 42 ug/dL — ABNORMAL LOW (ref 45–182)
Saturation Ratios: 12 % — ABNORMAL LOW (ref 17.9–39.5)
TIBC: 344 ug/dL (ref 250–450)
UIBC: 302 ug/dL (ref 117–376)

## 2024-01-05 LAB — CMP (CANCER CENTER ONLY)
ALT: 19 U/L (ref 0–44)
AST: 21 U/L (ref 15–41)
Albumin: 4.3 g/dL (ref 3.5–5.0)
Alkaline Phosphatase: 59 U/L (ref 38–126)
Anion gap: 4 — ABNORMAL LOW (ref 5–15)
BUN: 13 mg/dL (ref 6–20)
CO2: 30 mmol/L (ref 22–32)
Calcium: 9.3 mg/dL (ref 8.9–10.3)
Chloride: 104 mmol/L (ref 98–111)
Creatinine: 0.67 mg/dL (ref 0.61–1.24)
GFR, Estimated: >60 mL/min (ref >60)
Glucose, Bld: 104 mg/dL — ABNORMAL HIGH (ref 70–99)
Potassium: 3.7 mmol/L (ref 3.5–5.1)
Sodium: 138 mmol/L (ref 135–145)
Total Bilirubin: 0.3 mg/dL (ref 0.0–1.2)
Total Protein: 7.1 g/dL (ref 6.5–8.1)

## 2024-01-05 LAB — RETIC PANEL
Immature Retic Fract: 9.8 % (ref 2.3–15.9)
RBC.: 4.44 MIL/uL (ref 4.22–5.81)
Retic Count, Absolute: 36 10*3/uL (ref 19.0–186.0)
Retic Ct Pct: 0.8 % (ref 0.4–3.1)
Reticulocyte Hemoglobin: 30.2 pg (ref >27.9)

## 2024-01-05 LAB — FERRITIN: Ferritin: 156 ng/mL (ref 24–336)

## 2024-01-05 NOTE — Progress Notes (Signed)
 [facility] [phone]   [phone]  PROGRESS NOTE  Patient Care Team: [place], Betty G, MD as PCP - General (Family Medicine) [place], Betty G, MD (Family Medicine) Astrid Drafts Zadie Cleverly, RN as Case Manager Tarry Kos, MD as Attending Physician (Orthopedic Surgery)  CHIEF COMPLAINTS/PURPOSE OF CONSULTATION:  Iron deficiency anemia  HISTORY OF PRESENTING ILLNESS:  Philip Bates 63 y.o. male returns for a follow up for iron deficiency anemia. He was last seen on 07/10/2023. In the interim, he continues on PO iron therapy.   On exam today, [name] reports he has been well overall interim since her last visit.  He had EGD and colonoscopy in January 2025 and was found to have an H. pylori infection.  He was started on antibiotic therapy and will be due for stool sample soon in order to assure eradication.  Reports his energy levels have been about a 7 out of 10.  He is able to do his day-to-day tasks without warning of energy, but is limited by his hip pain.  He reports he is eating well and had no recent infectious symptoms such as runny nose, sore throat, cough.  He is tolerating his iron pills without difficulty and denies any nausea, vomiting, or constipation.  Does not cause dark stools.  He reports he is doing his best to try to eat red meat and spinach and does enjoy eating string beans.  He is had no lightheadedness, dizziness, shortness of breath.  He is not having any bleeding, bruising, or dark stools.  He notes that he is not currently taking any new medications.  Overall he feels well and has no questions concerns or complaints today.  He denies fevers, chills, sweats, shortness of breath, chest pain or cough. He has no other complaints. Rest of the ROS is below.   MEDICAL HISTORY:  Past Medical History:  Diagnosis Date   Arthritis    Cerebral palsy (HCC)    Cervical radiculopathy    H/O umbilical hernia repair 2001   Pre-diabetes    Scoliosis     Spondylosis of cervical spine     SURGICAL HISTORY: Past Surgical History:  Procedure Laterality Date   ANTERIOR CERVICAL DECOMPRESSION/DISCECTOMY FUSION 4 LEVELS N/A 08/11/2017   Procedure: Cervical three to Cervical seven Anterior Cervical discectomy with fusion/plate fixation;  Surgeon: Ditty, Loura Halt, MD;  Location: [facility] OR;  Service: Neurosurgery;  Laterality: N/A;   BACK SURGERY     HERNIA REPAIR     inguinal hernia repair in early 2000   LEG SURGERY     POSTERIOR CERVICAL FUSION/FORAMINOTOMY N/A 08/12/2018   Procedure: POSTERIOR CERVICAL FUSION WITH LATERAL MASS FIXATION CERVICAL THREE TO CERVICAL SEVEN;  Surgeon: Lisbeth Renshaw, MD;  Location: [facility] OR;  Service: Neurosurgery;  Laterality: N/A;   SHOULDER ARTHROSCOPY WITH ROTATOR CUFF REPAIR AND SUBACROMIAL DECOMPRESSION Right 02/02/2018   Procedure: RIGHT SHOULDER ARTHROSCOPY WITH SUBACROMIAL DECOMPRESSION, MINI-OPEN ROTATOR CUFF REPAIR;  Surgeon: Cammy Copa, MD;  Location: [facility] OR;  Service: Orthopedics;  Laterality: Right;   SHOULDER SURGERY Right    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   Procedure: LEFT TOTAL HIP ARTHROPLASTY ANTERIOR APPROACH;  Surgeon: Tarry Kos, MD;  Location: [facility] OR;  Service: Orthopedics;  Laterality: Left;    SOCIAL HISTORY: Social History   Socioeconomic History   Marital status: Married    Spouse name: Not on file   Number of children: Not on file   Years of education: Not on file  Highest education level: Some college, no degree  Occupational History   Not on file  Tobacco Use   Smoking status: Every Day    Current packs/day: 1.00    Average packs/day: 1 pack/day for 35.0 years (35.0 ttl pk-yrs)    Types: Cigarettes   Smokeless tobacco: Never  Vaping Use   Vaping status: Never Used  Substance and Sexual Activity   Alcohol use: Never   Drug use: Never   Sexual activity: Yes  Other Topics Concern   Not on file  Social History Narrative   ** Merged History Encounter **        Social Drivers of Health   Financial Resource Strain: Low Risk  (08/17/2023)   Overall Financial Resource Strain (CARDIA)    Difficulty of Paying Living Expenses: Not very hard  Recent Concern: Financial Resource Strain - Medium Risk (05/28/2023)   Overall Financial Resource Strain (CARDIA)    Difficulty of Paying Living Expenses: Somewhat hard  Food Insecurity: Food Insecurity Present (08/17/2023)   Hunger Vital Sign    Worried About Running Out of Food in the Last Year: Sometimes true    Ran Out of Food in the Last Year: Sometimes true  Transportation Needs: Unmet Transportation Needs (08/17/2023)   PRAPARE - Transportation    Lack of Transportation (Medical): No    Lack of Transportation (Non-Medical): Yes  Physical Activity: Insufficiently Active (08/17/2023)   Exercise Vital Sign    Days of Exercise per Week: 3 days    Minutes of Exercise per Session: 30 min  Stress: Stress Concern Present (08/17/2023)   [employer] of Occupational Health - Occupational Stress Questionnaire    Feeling of Stress : To some extent  Social Connections: Socially Integrated (08/17/2023)   Social Connection and Isolation Panel [NHANES]    Frequency of Communication with Friends and Family: Three times a week    Frequency of Social Gatherings with Friends and Family: Once a week    Attends Religious Services: More than 4 times per year    Active Member of [employer] or Organizations: Yes    Attends Engineer, structural: More than 4 times per year    Marital Status: Married  Recent Concern: Social Connections - Moderately Isolated (05/28/2023)   Social Connection and Isolation Panel [NHANES]    Frequency of Communication with Friends and Family: Once a week    Frequency of Social Gatherings with Friends and Family: Once a week    Attends Religious Services: 1 to 4 times per year    Active Member of [employer] or Organizations: No    Attends Engineer, structural: Not on file     Marital Status: Married  Catering manager Violence: Not At Risk (06/12/2023)   Humiliation, Afraid, Rape, and Kick questionnaire    Fear of Current or Ex-Partner: No    Emotionally Abused: No    Physically Abused: No    Sexually Abused: No    FAMILY HISTORY: Family History  Problem Relation Age of Onset   Esophageal cancer Father    Cancer Father        esophagus   Diabetes Brother    Diabetes Paternal Uncle    Colon cancer Neg Hx    Rectal cancer Neg Hx    Stomach cancer Neg Hx     ALLERGIES:  has no known allergies.  MEDICATIONS:  Current Outpatient Medications  Medication Sig Dispense Refill   aspirin EC 81 MG tablet Take 1 tablet (81 mg total)  by mouth 2 (two) times daily. 84 tablet 0   bismuth subsalicylate (PEPTO-BISMOL) 262 MG chewable tablet Chew 2 tablets (524 mg total) by mouth 4 (four) times daily -  before meals and at bedtime. 112 tablet 0   doxycycline (VIBRAMYCIN) 100 MG capsule Take 1 capsule (100 mg total) by mouth 2 (two) times daily. 28 capsule 0   ergocalciferol (VITAMIN D2) 1.25 MG (50000 UT) capsule 1 (ONE) CAPSULE CAPSULE BY MOUTH ONCE A WEEK     ferrous sulfate 325 (65 FE) MG tablet Take 1 tablet (325 mg total) by mouth daily with breakfast. Please take with a source of Vitamin C 90 tablet 3   fluconazole (DIFLUCAN) 100 MG tablet DO NOT START UNTIL H PYLORI TREATMENT IS FINISHED!! Take 2 tabs by mouth day 1 then take 1 tablet daily until gone. 15 tablet 0   gabapentin (NEURONTIN) 400 MG capsule Take 1 capsule (400 mg total) by mouth 3 (three) times daily. 90 capsule 3   ibuprofen (ADVIL) 800 MG tablet Take 800 mg by mouth 3 (three) times daily as needed.     latanoprost (XALATAN) 0.005 % ophthalmic solution SMARTSIG:1 Drop(s) In Eye(s) Every Evening     metroNIDAZOLE (FLAGYL) 250 MG tablet Take 1 tablet (250 mg total) by mouth 4 (four) times daily. 56 tablet 0   Na Sulfate-K Sulfate-Mg Sulfate concentrate (SUPREP) 17.5-3.13-1.6 GM/177ML SOLN Take 1 kit by  mouth once.     NARCAN 4 MG/0.1ML LIQD nasal spray kit Place 1 spray into the nose as needed (opioid overdose).     omeprazole (PRILOSEC) 20 MG capsule Take 1 capsule (20 mg total) by mouth 2 (two) times daily before a meal. 28 capsule 0   oxyCODONE-acetaminophen (PERCOCET) 10-325 MG tablet Take 1 tablet by mouth every 4 (four) hours as needed. 180 tablet 0   sildenafil (REVATIO) 20 MG tablet TAKE 2 TO 3 TABLETS BY MOUTH ONCE DAILY AS NEEDED 90 tablet 2   tamsulosin (FLOMAX) 0.4 MG CAPS capsule Take 1 capsule (0.4 mg total) by mouth daily after supper. 90 capsule 3   No current facility-administered medications for this visit.    REVIEW OF SYSTEMS:   Constitutional: ( - ) fevers, ( - )  chills , ( - ) night sweats Eyes: ( - ) blurriness of vision, ( - ) double vision, ( - ) watery eyes Ears, nose, mouth, throat, and face: ( - ) mucositis, ( - ) sore throat Respiratory: ( - ) cough, ( - ) dyspnea, ( - ) wheezes Cardiovascular: ( - ) palpitation, ( - ) chest discomfort, ( - ) lower extremity swelling Gastrointestinal:  ( - ) nausea, ( - ) heartburn, ( - ) change in bowel habits Skin: ( - ) abnormal skin rashes Lymphatics: ( - ) new lymphadenopathy, ( - ) easy bruising Neurological: ( - ) numbness, ( - ) tingling, ( - ) new weaknesses Behavioral/Psych: ( - ) mood change, ( - ) new changes  All other systems were reviewed with the patient and are negative.  PHYSICAL EXAMINATION: ECOG PERFORMANCE STATUS: 1 - Symptomatic but completely ambulatory  Vitals:   01/05/24 1101  BP: (!) 140/85  Pulse: 72  Resp: 14  Temp: 98.1 F (36.7 C)  SpO2: 99%     Filed Weights   01/05/24 1101  Weight: 119 lb 3.2 oz (54.1 kg)      GENERAL: well appearing male in NAD  SKIN: skin color, texture, turgor are normal, no rashes or  significant lesions EYES: conjunctiva are pink and non-injected, sclera clear LUNGS: clear to auscultation and percussion with normal breathing effort HEART: regular rate  & rhythm and no murmurs and no lower extremity edema Musculoskeletal: no cyanosis of digits and no clubbing  PSYCH: alert & oriented x 3, fluent speech NEURO: no focal motor/sensory deficits  LABORATORY DATA:  I have reviewed the data as listed    Latest Ref Rng & Units 01/05/2024   10:35 AM 10/09/2023    1:40 PM 07/10/2023   12:55 PM  CBC  WBC 4.0 - 10.5 K/uL 5.2  5.7  6.6   Hemoglobin 13.0 - 17.0 g/dL 09.8  11.9  14.7   Hematocrit 39.0 - 52.0 % 35.5  36.1  35.0   Platelets 150 - 400 K/uL 184  176  197        Latest Ref Rng & Units 01/05/2024   10:35 AM 10/09/2023    1:40 PM 02/20/2023    3:56 PM  CMP  Glucose 70 - 99 mg/dL 829  562  83   BUN 6 - 20 mg/dL 13  11  13    Creatinine 0.61 - 1.24 mg/dL 1.30  8.65  7.84   Sodium 135 - 145 mmol/L 138  138  139   Potassium 3.5 - 5.1 mmol/L 3.7  3.5  3.8   Chloride 98 - 111 mmol/L 104  101  105   CO2 22 - 32 mmol/L 30  32  29   Calcium 8.9 - 10.3 mg/dL 9.3  9.5  9.3   Total Protein 6.5 - 8.1 g/dL 7.1  6.9  7.5   Total Bilirubin 0.0 - 1.2 mg/dL 0.3  0.4  0.4   Alkaline Phos 38 - 126 U/L 59  57  70   AST 15 - 41 U/L 21  23  26    ALT 0 - 44 U/L 19  20  32    RADIOGRAPHIC STUDIES: I have personally reviewed the radiological images as listed and agreed with the findings in the report. No results found.  ASSESSMENT & PLAN Vance Hochmuth is a 63 y.o. male who returns for a follow up for iron deficiency anemia.   #Iron deficiency anemia 2/2 to GI Bleeding/H. Pylori: --Patient underwent EGD in January 2025 which showed evidence of H. pylori infection.  He has completed the antibiotic regimen.  Currently awaiting testing for eradication in stool. --Currently on PO iron replacement daily.  --Labs today show mild anemia with Hgb 12.4, white blood cell 5.2, MCV 80.1, platelets 184 --Recommend IV iron to bolster levels if iron levels are persistently low today.  --RTC in 6 months for labs/follow up.   No orders of the defined types were placed in  this encounter.  All questions were answered. The patient knows to call the clinic with any problems, questions or concerns.  I have spent a total of 25 minutes minutes of face-to-face and non-face-to-face time, preparing to see the patient, performing a medically appropriate examination, counseling and educating the patient, ordering medications/tests/procedures, documenting clinical information in the electronic health record,  and care coordination.   Ulysees Barns, MD Department of Hematology/Oncology [facility] at [facility] Phone: [phone] Pager: [phone] Email: [email or web address]

## 2024-01-07 ENCOUNTER — Other Ambulatory Visit: Payer: Self-pay | Admitting: Family Medicine

## 2024-01-07 ENCOUNTER — Ambulatory Visit: Payer: 59 | Admitting: Hematology and Oncology

## 2024-01-07 DIAGNOSIS — N529 Male erectile dysfunction, unspecified: Secondary | ICD-10-CM

## 2024-01-07 LAB — TOXASSURE SELECT,+ANTIDEPR,UR

## 2024-01-26 ENCOUNTER — Encounter: Attending: Physical Medicine and Rehabilitation | Admitting: Registered Nurse

## 2024-01-26 VITALS — BP 133/90 | HR 73 | Ht 64.0 in | Wt 118.0 lb

## 2024-01-26 DIAGNOSIS — M545 Low back pain, unspecified: Secondary | ICD-10-CM

## 2024-01-26 DIAGNOSIS — M25512 Pain in left shoulder: Secondary | ICD-10-CM | POA: Insufficient documentation

## 2024-01-26 DIAGNOSIS — M542 Cervicalgia: Secondary | ICD-10-CM

## 2024-01-26 DIAGNOSIS — Z79891 Long term (current) use of opiate analgesic: Secondary | ICD-10-CM

## 2024-01-26 DIAGNOSIS — M25511 Pain in right shoulder: Secondary | ICD-10-CM | POA: Diagnosis not present

## 2024-01-26 DIAGNOSIS — Z5181 Encounter for therapeutic drug level monitoring: Secondary | ICD-10-CM

## 2024-01-26 DIAGNOSIS — M5412 Radiculopathy, cervical region: Secondary | ICD-10-CM

## 2024-01-26 DIAGNOSIS — M961 Postlaminectomy syndrome, not elsewhere classified: Secondary | ICD-10-CM

## 2024-01-26 DIAGNOSIS — M25552 Pain in left hip: Secondary | ICD-10-CM | POA: Diagnosis not present

## 2024-01-26 DIAGNOSIS — G8929 Other chronic pain: Secondary | ICD-10-CM | POA: Diagnosis not present

## 2024-01-26 DIAGNOSIS — G894 Chronic pain syndrome: Secondary | ICD-10-CM | POA: Diagnosis not present

## 2024-01-26 MED ORDER — OXYCODONE-ACETAMINOPHEN 10-325 MG PO TABS: 1.0000 | ORAL_TABLET | ORAL | 0 refills | Status: DC | PRN

## 2024-01-26 NOTE — Progress Notes (Signed)
 Subjective:    Patient ID: Philip Bates, male    DOB: Mar 05, 1961, 63 y.o.   MRN: [id number]  HPI: Philip Bates is a 63 y.o. male who returns for follow up appointment for chronic pain and medication refill. He states his pain is located in his neck radiating into his bilateral shoulders, lower back pain and left hip pain. He rates his pain 8. His current exercise regime is walking and performing stretching exercises.  Mr. Chakraborty Morphine  equivalent is 90.00 MME.   Last UDS was Performed on 01/04/2024, it was consistent.     Pain Inventory Average Pain 8 Pain Right Now 8 My pain is sharp, burning, dull, stabbing, tingling, and aching  In the last 24 hours, has pain interfered with the following? General activity 6 Relation with others 3 Enjoyment of life 5 What TIME of day is your pain at its worst? morning  Sleep (in general) Poor  Pain is worse with: walking, bending, sitting, standing, and some activites Pain improves with: medication Relief from Meds: 9  Family History  Problem Relation Age of Onset   Esophageal cancer Father    Cancer Father        esophagus   Diabetes Brother    Diabetes Paternal Uncle    Colon cancer Neg Hx    Rectal cancer Neg Hx    Stomach cancer Neg Hx    Social History   Socioeconomic History   Marital status: Married    Spouse name: Not on file   Number of children: Not on file   Years of education: Not on file   Highest education level: Some college, no degree  Occupational History   Not on file  Tobacco Use   Smoking status: Every Day    Current packs/day: 1.00    Average packs/day: 1 pack/day for 35.0 years (35.0 ttl pk-yrs)    Types: Cigarettes   Smokeless tobacco: Never  Vaping Use   Vaping status: Never Used  Substance and Sexual Activity   Alcohol  use: Never   Drug use: Never   Sexual activity: Yes  Other Topics Concern   Not on file  Social History Narrative   ** Merged History Encounter **       Social Drivers of  Health   Financial Resource Strain: Low Risk  (08/17/2023)   Overall Financial Resource Strain (CARDIA)    Difficulty of Paying Living Expenses: Not very hard  Recent Concern: Financial Resource Strain - Medium Risk (05/28/2023)   Overall Financial Resource Strain (CARDIA)    Difficulty of Paying Living Expenses: Somewhat hard  Food Insecurity: Food Insecurity Present (08/17/2023)   Hunger Vital Sign    Worried About Running Out of Food in the Last Year: Sometimes true    Ran Out of Food in the Last Year: Sometimes true  Transportation Needs: Unmet Transportation Needs (08/17/2023)   PRAPARE - Transportation    Lack of Transportation (Medical): No    Lack of Transportation (Non-Medical): Yes  Physical Activity: Insufficiently Active (08/17/2023)   Exercise Vital Sign    Days of Exercise per Week: 3 days    Minutes of Exercise per Session: 30 min  Stress: Stress Concern Present (08/17/2023)   [employer] of Occupational Health - Occupational Stress Questionnaire    Feeling of Stress : To some extent  Social Connections: Socially Integrated (08/17/2023)   Social Connection and Isolation Panel [NHANES]    Frequency of Communication with Friends and Family: Three times a week  Frequency of Social Gatherings with Friends and Family: Once a week    Attends Religious Services: More than 4 times per year    Active Member of [employer] or Organizations: Yes    Attends Engineer, structural: More than 4 times per year    Marital Status: Married  Recent Concern: Social Connections - Moderately Isolated (05/28/2023)   Social Connection and Isolation Panel [NHANES]    Frequency of Communication with Friends and Family: Once a week    Frequency of Social Gatherings with Friends and Family: Once a week    Attends Religious Services: 1 to 4 times per year    Active Member of [employer] or Organizations: No    Attends Engineer, structural: Not on file    Marital Status: Married    Past Surgical History:  Procedure Laterality Date   ANTERIOR CERVICAL DECOMPRESSION/DISCECTOMY FUSION 4 LEVELS N/A 08/11/2017   Procedure: Cervical three to Cervical seven Anterior Cervical discectomy with fusion/plate fixation;  Surgeon: Ditty, Raelene Bullocks, MD;  Location: [facility] OR;  Service: Neurosurgery;  Laterality: N/A;   BACK SURGERY     HERNIA REPAIR     inguinal hernia repair in early 2000   LEG SURGERY     POSTERIOR CERVICAL FUSION/FORAMINOTOMY N/A 08/12/2018   Procedure: POSTERIOR CERVICAL FUSION WITH LATERAL MASS FIXATION CERVICAL THREE TO CERVICAL SEVEN;  Surgeon: Augusto Blonder, MD;  Location: [facility] OR;  Service: Neurosurgery;  Laterality: N/A;   SHOULDER ARTHROSCOPY WITH ROTATOR CUFF REPAIR AND SUBACROMIAL DECOMPRESSION Right 02/02/2018   Procedure: RIGHT SHOULDER ARTHROSCOPY WITH SUBACROMIAL DECOMPRESSION, MINI-OPEN ROTATOR CUFF REPAIR;  Surgeon: Jasmine Mesi, MD;  Location[facility] OR;  Service: Orthopedics;  Laterality: Right;   SHOULDER SURGERY Right    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   Procedure: LEFT TOTAL HIP ARTHROPLASTY ANTERIOR APPROACH;  Surgeon: Wes Hamman, MD;  Location: [facility] OR;  Service: Orthopedics;  Laterality: Left;   Past Surgical History:  Procedure Laterality Date   ANTERIOR CERVICAL DECOMPRESSION/DISCECTOMY FUSION 4 LEVELS N/A 08/11/2017   Procedure: Cervical three to Cervical seven Anterior Cervical discectomy with fusion/plate fixation;  Surgeon: Ditty, Raelene Bullocks, MD;  Location: [facility] OR;  Service: Neurosurgery;  Laterality: N/A;   BACK SURGERY     HERNIA REPAIR     inguinal hernia repair in early 2000   LEG SURGERY     POSTERIOR CERVICAL FUSION/FORAMINOTOMY N/A 08/12/2018   Procedure: POSTERIOR CERVICAL FUSION WITH LATERAL MASS FIXATION CERVICAL THREE TO CERVICAL SEVEN;  Surgeon: Augusto Blonder, MD;  Location: [facility] OR;  Service: Neurosurgery;  Laterality: N/A;   SHOULDER ARTHROSCOPY WITH ROTATOR CUFF REPAIR AND SUBACROMIAL DECOMPRESSION  Right 02/02/2018   Procedure: RIGHT SHOULDER ARTHROSCOPY WITH SUBACROMIAL DECOMPRESSION, MINI-OPEN ROTATOR CUFF REPAIR;  Surgeon: Jasmine Mesi, MD;  Location: [facility] OR;  Service: Orthopedics;  Laterality: Right;   SHOULDER SURGERY Right    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   Procedure: LEFT TOTAL HIP ARTHROPLASTY ANTERIOR APPROACH;  Surgeon: Wes Hamman, MD;  Location: [facility] OR;  Service: Orthopedics;  Laterality: Left;   Past Medical History:  Diagnosis Date   Arthritis    Cerebral palsy (HCC)    Cervical radiculopathy    H/O umbilical hernia repair 2001   Pre-diabetes    Scoliosis    Spondylosis of cervical spine    BP (!) 133/90   Pulse 73   Ht 5\' 4"  (1.626 m)   Wt 118 lb (53.5 kg)   SpO2 99%   BMI 20.25 kg/m  Opioid Risk Score:   Fall Risk Score:  `1  Depression screen [facility] 2/9     12/02/2023    1:45 PM 11/05/2023   10:58 AM 09/07/2023    1:30 PM 07/06/2023    2:45 PM 06/12/2023   10:28 AM 05/29/2023    9:46 AM 05/29/2023    9:45 AM  Depression screen PHQ 2/9  Decreased Interest 0 0 1 1 0 3 3  Down, Depressed, Hopeless 0 0 1 1 0 1 1  PHQ - 2 Score 0 0 2 2 0 4 4  Altered sleeping     0 3 3  Tired, decreased energy     0 1 1  Change in appetite     0 2 2  Feeling bad or failure about yourself      0 1 1  Trouble concentrating     0 1 1  Moving slowly or fidgety/restless     0 1 1  Suicidal thoughts     0 0 0  PHQ-9 Score     0 13 13  Difficult doing work/chores     Not difficult at all Extremely dIfficult Extremely dIfficult     Review of Systems  Musculoskeletal:  Positive for back pain.       Bilateral shoulder pain Bilateral knee pain Right elbow pain  All other systems reviewed and are negative.     Objective:   Physical Exam Constitutional:      Appearance: Normal appearance.  Neck:     Comments: Cervical Paraspinal Tenderness: C-5-C-6 Cardiovascular:     Rate and Rhythm: Normal rate and regular rhythm.     Pulses: Normal pulses.     Heart  sounds: Normal heart sounds.  Pulmonary:     Effort: Pulmonary effort is normal.     Breath sounds: Normal breath sounds.  Musculoskeletal:     Comments: Normal Muscle Bulk and Muscle Testing Reveals:  Upper Extremities: Full ROM and Muscle Strength 5/5 Bilateral AC Joint Tenderness  Lumbar Paraspinal Tenderness: L-4-L-5 Lower Extremities: Full ROM and Muscle Strength 5/5 Wearing Left AFO Arises from Table slowly using cane for support Antalgic  Gait     Skin:    General: Skin is warm and dry.  Neurological:     Mental Status: He is alert and oriented to person, place, and time.  Psychiatric:        Mood and Affect: Mood normal.        Behavior: Behavior normal.         Assessment & Plan:  Cervicalgia/ Cervical Radiculitis: Continue HEP as Tolerated. Continue to Monitor. Continue Gabapentin . Continue to Monitor. 01/26/2024 Chronic Bilateral;  Shoulder Pain:  Continue HEP as Tolerated. Continue to Monitor. Ortho Following. 01/26/2024 Failed Back Syndrome: Continue HEP as Tolerated. Continue to Monitor. 0422/2025 Chronic Bilateral Low Back Pain without Sciatica: Continue HEP as Tolerated. Continue current medication regimen. Continue to Monitor. 01/26/2024 Chronic Left Hip Pain: S/P On 09/10/2020: Dr Christiane Cowing LEFT TOTAL HIP ARTHROPLASTY ANTERIOR APPROACH Continue HEP as Tolerated. Continue current medication regimen. Continue to Monitor. 01/26/2024 Chronic Left knee Pain: Continue HEP as Tolerated. Continue to Monitor. No complaints today. Continue to Monitor. 01/26/2024 Left Foot Drop: Wearing AFO: [employer] Following. Continue to Monitor. 01/26/2024 Chronic Pain Syndrome: Refilled Oxycodone  10/325 mg one tablet every 4 hours as needed for pain #180.  We will continue the opioid monitoring program, this consists of regular clinic visits, examinations, urine drug screen, pill counts as well as use of  [place]  Controlled Substance Reporting system. A 12 month History has been reviewed on  the [place]  Controlled Substance Reporting System on 01/26/2024   F/U in 1 month

## 2024-01-28 ENCOUNTER — Ambulatory Visit: Payer: 59 | Admitting: Registered Nurse

## 2024-01-31 ENCOUNTER — Encounter: Payer: Self-pay | Admitting: Registered Nurse

## 2024-02-08 ENCOUNTER — Emergency Department (HOSPITAL_COMMUNITY)
Admission: EM | Admit: 2024-02-08 | Discharge: 2024-02-08 | Discharge status: Against Medical Advice | Attending: Emergency Medicine | Admitting: Emergency Medicine

## 2024-02-08 ENCOUNTER — Encounter (HOSPITAL_COMMUNITY): Payer: Self-pay

## 2024-02-08 ENCOUNTER — Other Ambulatory Visit: Payer: Self-pay

## 2024-02-08 DIAGNOSIS — R2231 Localized swelling, mass and lump, right upper limb: Secondary | ICD-10-CM | POA: Diagnosis not present

## 2024-02-08 DIAGNOSIS — L03011 Cellulitis of right finger: Secondary | ICD-10-CM | POA: Diagnosis not present

## 2024-02-08 DIAGNOSIS — M79644 Pain in right finger(s): Secondary | ICD-10-CM | POA: Diagnosis not present

## 2024-02-08 DIAGNOSIS — Z5321 Procedure and treatment not carried out due to patient leaving prior to being seen by health care provider: Secondary | ICD-10-CM | POA: Diagnosis not present

## 2024-02-08 NOTE — ED Provider Triage Note (Cosign Needed Addendum)
 Emergency Medicine Provider Triage Evaluation Note  Philip Bates , a 63 y.o. male  was evaluated in triage.  Pt complains of swelling and pain in his right index finger.  Reports he is noticing pus forming near his nailbed and he wanted to get this evaluated.  Denies any known injury to the finger.  Review of Systems  Positive: As above Negative: As above  Physical Exam  BP (!) 134/98   Pulse 65   Temp 98.6 F (37 C) (Oral)   Resp 17   Ht 5\' 4"  (1.626 m)   Wt 54.4 kg   SpO2 100%   BMI 20.60 kg/m  Gen:   Awake, no distress   Resp:  Normal effort  MSK:   Moves extremities without difficulty   Paronychia of right index finger Medical Decision Making  Medically screening exam initiated at 3:29 PM.  Appropriate orders placed.  Philip Bates was informed that the remainder of the evaluation will be completed by another provider, this initial triage assessment does not replace that evaluation, and the importance of remaining in the ED until their evaluation is complete.     Rexie Catena, PA-C 02/08/24 1529    Rexie Catena, PA-C 02/08/24 1530

## 2024-02-08 NOTE — ED Triage Notes (Signed)
 Injury to right hand a few days ago, index and middle finger are hurting and swollen.

## 2024-02-26 ENCOUNTER — Encounter: Admitting: Registered Nurse

## 2024-03-02 ENCOUNTER — Ambulatory Visit: Admitting: Orthopaedic Surgery

## 2024-03-03 ENCOUNTER — Encounter: Payer: Self-pay | Admitting: Registered Nurse

## 2024-03-03 ENCOUNTER — Encounter: Attending: Physical Medicine and Rehabilitation | Admitting: Registered Nurse

## 2024-03-03 VITALS — BP 130/84 | HR 67 | Ht 64.0 in | Wt 121.4 lb

## 2024-03-03 DIAGNOSIS — M25512 Pain in left shoulder: Secondary | ICD-10-CM | POA: Insufficient documentation

## 2024-03-03 DIAGNOSIS — Z5181 Encounter for therapeutic drug level monitoring: Secondary | ICD-10-CM | POA: Diagnosis not present

## 2024-03-03 DIAGNOSIS — M5412 Radiculopathy, cervical region: Secondary | ICD-10-CM | POA: Diagnosis not present

## 2024-03-03 DIAGNOSIS — M545 Low back pain, unspecified: Secondary | ICD-10-CM | POA: Diagnosis not present

## 2024-03-03 DIAGNOSIS — M961 Postlaminectomy syndrome, not elsewhere classified: Secondary | ICD-10-CM | POA: Diagnosis not present

## 2024-03-03 DIAGNOSIS — M25511 Pain in right shoulder: Secondary | ICD-10-CM | POA: Insufficient documentation

## 2024-03-03 DIAGNOSIS — G894 Chronic pain syndrome: Secondary | ICD-10-CM | POA: Insufficient documentation

## 2024-03-03 DIAGNOSIS — M542 Cervicalgia: Secondary | ICD-10-CM | POA: Insufficient documentation

## 2024-03-03 DIAGNOSIS — Z79891 Long term (current) use of opiate analgesic: Secondary | ICD-10-CM | POA: Insufficient documentation

## 2024-03-03 DIAGNOSIS — M25552 Pain in left hip: Secondary | ICD-10-CM | POA: Diagnosis not present

## 2024-03-03 DIAGNOSIS — G8929 Other chronic pain: Secondary | ICD-10-CM | POA: Insufficient documentation

## 2024-03-03 MED ORDER — OXYCODONE-ACETAMINOPHEN 10-325 MG PO TABS: 1.0000 | ORAL_TABLET | ORAL | 0 refills | Status: DC | PRN

## 2024-03-03 NOTE — Progress Notes (Signed)
 Subjective:    Patient ID: Philip Bates, male    DOB: 1961-01-18, 63 y.o.   MRN: [id number]  HPI: Philip Bates is a 63 y.o. male who returns for follow up appointment for chronic pain and medication refill. He states his  pain is located in his  neck radiating into her left shoulder, right shoulder pain, lower back and left hip pain. She  rates her pain 7. His  current exercise regime is walking and performing stretching exercises.  Mr. Boehle Morphine  equivalent is 90.00 MME.   Last UDS was Performed on 01/04/2024, it was consistent.     Pain Inventory Average Pain 8 Pain Right Now 7 My pain is constant, sharp, burning, dull, stabbing, tingling, aching, and numbness  In the last 24 hours, has pain interfered with the following? General activity 7 Relation with others 8 Enjoyment of life 7 What TIME of day is your pain at its worst? morning , daytime, evening, night, and varies Sleep (in general) Poor  Pain is worse with: walking, bending, sitting, inactivity, and standing Pain improves with: rest and medication Relief from Meds: 9  Family History  Problem Relation Age of Onset   Esophageal cancer Father    Cancer Father        esophagus   Diabetes Brother    Diabetes Paternal Uncle    Colon cancer Neg Hx    Rectal cancer Neg Hx    Stomach cancer Neg Hx    Social History   Socioeconomic History   Marital status: Married    Spouse name: Not on file   Number of children: Not on file   Years of education: Not on file   Highest education level: Some college, no degree  Occupational History   Not on file  Tobacco Use   Smoking status: Every Day    Current packs/day: 1.00    Average packs/day: 1 pack/day for 35.0 years (35.0 ttl pk-yrs)    Types: Cigarettes   Smokeless tobacco: Never  Vaping Use   Vaping status: Never Used  Substance and Sexual Activity   Alcohol  use: Never   Drug use: Never   Sexual activity: Yes  Other Topics Concern   Not on file  Social  History Narrative   ** Merged History Encounter **       Social Drivers of Health   Financial Resource Strain: Low Risk  (08/17/2023)   Overall Financial Resource Strain (CARDIA)    Difficulty of Paying Living Expenses: Not very hard  Recent Concern: Financial Resource Strain - Medium Risk (05/28/2023)   Overall Financial Resource Strain (CARDIA)    Difficulty of Paying Living Expenses: Somewhat hard  Food Insecurity: Food Insecurity Present (08/17/2023)   Hunger Vital Sign    Worried About Running Out of Food in the Last Year: Sometimes true    Ran Out of Food in the Last Year: Sometimes true  Transportation Needs: Unmet Transportation Needs (08/17/2023)   PRAPARE - Transportation    Lack of Transportation (Medical): No    Lack of Transportation (Non-Medical): Yes  Physical Activity: Insufficiently Active (08/17/2023)   Exercise Vital Sign    Days of Exercise per Week: 3 days    Minutes of Exercise per Session: 30 min  Stress: Stress Concern Present (08/17/2023)   [employer] of Occupational Health - Occupational Stress Questionnaire    Feeling of Stress : To some extent  Social Connections: Socially Integrated (08/17/2023)   Social Connection and Isolation Panel [NHANES]  Frequency of Communication with Friends and Family: Three times a week    Frequency of Social Gatherings with Friends and Family: Once a week    Attends Religious Services: More than 4 times per year    Active Member of [employer] or Organizations: Yes    Attends Engineer, structural: More than 4 times per year    Marital Status: Married  Recent Concern: Social Connections - Moderately Isolated (05/28/2023)   Social Connection and Isolation Panel [NHANES]    Frequency of Communication with Friends and Family: Once a week    Frequency of Social Gatherings with Friends and Family: Once a week    Attends Religious Services: 1 to 4 times per year    Active Member of [employer] or Organizations: No     Attends Engineer, structural: Not on file    Marital Status: Married   Past Surgical History:  Procedure Laterality Date   ANTERIOR CERVICAL DECOMPRESSION/DISCECTOMY FUSION 4 LEVELS N/A 08/11/2017   Procedure: Cervical three to Cervical seven Anterior Cervical discectomy with fusion/plate fixation;  Surgeon: Ditty, Raelene Bullocks, MD;  Location: [facility] OR;  Service: Neurosurgery;  Laterality: N/A;   BACK SURGERY     HERNIA REPAIR     inguinal hernia repair in early 2000   LEG SURGERY     POSTERIOR CERVICAL FUSION/FORAMINOTOMY N/A 08/12/2018   Procedure: POSTERIOR CERVICAL FUSION WITH LATERAL MASS FIXATION CERVICAL THREE TO CERVICAL SEVEN;  Surgeon: Augusto Blonder, MD;  Location: [facility] OR;  Service: Neurosurgery;  Laterality: N/A;   SHOULDER ARTHROSCOPY WITH ROTATOR CUFF REPAIR AND SUBACROMIAL DECOMPRESSION Right 02/02/2018   Procedure: RIGHT SHOULDER ARTHROSCOPY WITH SUBACROMIAL DECOMPRESSION, MINI-OPEN ROTATOR CUFF REPAIR;  Surgeon: Jasmine Mesi, MD;  Location[facility] OR;  Service: Orthopedics;  Laterality: Right;   SHOULDER SURGERY Right    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   Procedure: LEFT TOTAL HIP ARTHROPLASTY ANTERIOR APPROACH;  Surgeon: Wes Hamman, MD;  Location: [facility] OR;  Service: Orthopedics;  Laterality: Left;   Past Surgical History:  Procedure Laterality Date   ANTERIOR CERVICAL DECOMPRESSION/DISCECTOMY FUSION 4 LEVELS N/A 08/11/2017   Procedure: Cervical three to Cervical seven Anterior Cervical discectomy with fusion/plate fixation;  Surgeon: Ditty, Raelene Bullocks, MD;  Location: [facility] OR;  Service: Neurosurgery;  Laterality: N/A;   BACK SURGERY     HERNIA REPAIR     inguinal hernia repair in early 2000   LEG SURGERY     POSTERIOR CERVICAL FUSION/FORAMINOTOMY N/A 08/12/2018   Procedure: POSTERIOR CERVICAL FUSION WITH LATERAL MASS FIXATION CERVICAL THREE TO CERVICAL SEVEN;  Surgeon: Augusto Blonder, MD;  Location: [facility] OR;  Service: Neurosurgery;  Laterality: N/A;    SHOULDER ARTHROSCOPY WITH ROTATOR CUFF REPAIR AND SUBACROMIAL DECOMPRESSION Right 02/02/2018   Procedure: RIGHT SHOULDER ARTHROSCOPY WITH SUBACROMIAL DECOMPRESSION, MINI-OPEN ROTATOR CUFF REPAIR;  Surgeon: Jasmine Mesi, MD;  Location: [facility] OR;  Service: Orthopedics;  Laterality: Right;   SHOULDER SURGERY Right    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   Procedure: LEFT TOTAL HIP ARTHROPLASTY ANTERIOR APPROACH;  Surgeon: Wes Hamman, MD;  Location: [facility] OR;  Service: Orthopedics;  Laterality: Left;   Past Medical History:  Diagnosis Date   Arthritis    Cerebral palsy (HCC)    Cervical radiculopathy    H/O umbilical hernia repair 2001   Pre-diabetes    Scoliosis    Spondylosis of cervical spine    BP (!) 155/92 (BP Location: Left Arm, Patient Position: Sitting)   Pulse 67   Ht  5\' 4"  (1.626 m)   Wt 121 lb 6.4 oz (55.1 kg)   SpO2 99%   BMI 20.84 kg/m   Opioid Risk Score:   Fall Risk Score:  `1  Depression screen [facility] 2/9     12/02/2023    1:45 PM 11/05/2023   10:58 AM 09/07/2023    1:30 PM 07/06/2023    2:45 PM 06/12/2023   10:28 AM 05/29/2023    9:46 AM 05/29/2023    9:45 AM  Depression screen PHQ 2/9  Decreased Interest 0 0 1 1 0 3 3  Down, Depressed, Hopeless 0 0 1 1 0 1 1  PHQ - 2 Score 0 0 2 2 0 4 4  Altered sleeping     0 3 3  Tired, decreased energy     0 1 1  Change in appetite     0 2 2  Feeling bad or failure about yourself      0 1 1  Trouble concentrating     0 1 1  Moving slowly or fidgety/restless     0 1 1  Suicidal thoughts     0 0 0  PHQ-9 Score     0 13 13  Difficult doing work/chores     Not difficult at all Extremely dIfficult Extremely dIfficult     Review of Systems     Objective:   Physical Exam Vitals and nursing note reviewed.  Constitutional:      Appearance: Normal appearance.  Neck:     Comments: Cervical Paraspinal Tenderness: C-5-C-6 Cardiovascular:     Rate and Rhythm: Normal rate and regular rhythm.     Pulses: Normal pulses.      Heart sounds: Normal heart sounds.  Pulmonary:     Effort: Pulmonary effort is normal.     Breath sounds: Normal breath sounds.  Musculoskeletal:     Comments: Normal Muscle Bulk and Muscle Testing Reveals:  Upper Extremities: Full ROM and Muscle Strength 5/5 Bilateral AC Joint Tenderness Lumbar Paraspinal Tenderness: L-4-L-5 Left Greater Trochanteric Tenderness Lower Extremities: Full ROM and Muscle Strength 5/5 Wearing Left AFO  Brace  Arises from Table slowly using cane  Antalgic  Gait     Skin:    General: Skin is warm and dry.  Neurological:     Mental Status: He is alert and oriented to person, place, and time.  Psychiatric:        Mood and Affect: Mood normal.        Behavior: Behavior normal.         Assessment & Plan:  Cervicalgia/ Cervical Radiculitis: Continue HEP as Tolerated. Continue to Monitor. Continue Gabapentin . Continue to Monitor. 03/03/2024 Chronic Bilateral;  Shoulder Pain:  Continue HEP as Tolerated. Continue to Monitor. Ortho Following. 03/03/2024 Failed Back Syndrome: Continue HEP as Tolerated. Continue to Monitor. 03/03/2024 Chronic Bilateral Low Back Pain without Sciatica: Continue HEP as Tolerated. Continue current medication regimen. Continue to Monitor. 03/03/2024 Chronic Left Hip Pain: S/P On 09/10/2020: Dr Christiane Cowing LEFT TOTAL HIP ARTHROPLASTY ANTERIOR APPROACH Continue HEP as Tolerated. Continue current medication regimen. Continue to Monitor. 03/03/2024 Chronic Left knee Pain: Continue HEP as Tolerated. Continue to Monitor. No complaints today. Continue to Monitor. 03/03/2024 Left Foot Drop: Wearing AFO: [employer] Following. Continue to Monitor. 03/03/2024 Chronic Pain Syndrome: Refilled Oxycodone  10/325 mg one tablet every 4 hours as needed for pain #180.  We will continue the opioid monitoring program, this consists of regular clinic visits, examinations, urine drug screen, pill counts  as well as use of [place]  Controlled Substance Reporting system.  A 12 month History has been reviewed on the [place]  Controlled Substance Reporting System on 03/03/2024   F/U in 1 month

## 2024-03-04 ENCOUNTER — Ambulatory Visit: Admitting: Registered Nurse

## 2024-03-07 ENCOUNTER — Other Ambulatory Visit: Payer: Self-pay | Admitting: Physical Medicine and Rehabilitation

## 2024-03-07 MED ORDER — IBUPROFEN 800 MG PO TABS: 800.0000 mg | ORAL_TABLET | Freq: Three times a day (TID) | ORAL | 0 refills | Status: DC | PRN

## 2024-03-15 ENCOUNTER — Ambulatory Visit (INDEPENDENT_AMBULATORY_CARE_PROVIDER_SITE_OTHER): Admitting: Physician Assistant

## 2024-03-15 ENCOUNTER — Other Ambulatory Visit (INDEPENDENT_AMBULATORY_CARE_PROVIDER_SITE_OTHER)

## 2024-03-15 DIAGNOSIS — Z96642 Presence of left artificial hip joint: Secondary | ICD-10-CM

## 2024-03-15 DIAGNOSIS — M25552 Pain in left hip: Secondary | ICD-10-CM
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
## 2024-03-29 ENCOUNTER — Encounter: Payer: Self-pay | Admitting: Registered Nurse

## 2024-03-29 ENCOUNTER — Ambulatory Visit (INDEPENDENT_AMBULATORY_CARE_PROVIDER_SITE_OTHER): Admitting: Urology

## 2024-03-29 ENCOUNTER — Encounter: Attending: Physical Medicine and Rehabilitation | Admitting: Registered Nurse

## 2024-03-29 ENCOUNTER — Encounter: Payer: Self-pay | Admitting: Urology

## 2024-03-29 VITALS — BP 164/84 | HR 59 | Ht 64.0 in | Wt 115.0 lb

## 2024-03-29 VITALS — BP 137/82 | HR 63 | Ht 64.0 in | Wt 124.0 lb

## 2024-03-29 DIAGNOSIS — M5412 Radiculopathy, cervical region: Secondary | ICD-10-CM | POA: Diagnosis not present

## 2024-03-29 DIAGNOSIS — R399 Unspecified symptoms and signs involving the genitourinary system: Secondary | ICD-10-CM | POA: Diagnosis not present

## 2024-03-29 DIAGNOSIS — Z5181 Encounter for therapeutic drug level monitoring: Secondary | ICD-10-CM | POA: Diagnosis not present

## 2024-03-29 DIAGNOSIS — M25511 Pain in right shoulder: Secondary | ICD-10-CM | POA: Insufficient documentation

## 2024-03-29 DIAGNOSIS — M25512 Pain in left shoulder: Secondary | ICD-10-CM | POA: Insufficient documentation

## 2024-03-29 DIAGNOSIS — M25552 Pain in left hip: Secondary | ICD-10-CM | POA: Diagnosis not present

## 2024-03-29 DIAGNOSIS — G894 Chronic pain syndrome: Secondary | ICD-10-CM | POA: Diagnosis not present

## 2024-03-29 DIAGNOSIS — G8929 Other chronic pain: Secondary | ICD-10-CM | POA: Diagnosis not present

## 2024-03-29 DIAGNOSIS — Z125 Encounter for screening for malignant neoplasm of prostate: Secondary | ICD-10-CM | POA: Diagnosis not present

## 2024-03-29 DIAGNOSIS — M545 Low back pain, unspecified: Secondary | ICD-10-CM | POA: Diagnosis not present

## 2024-03-29 DIAGNOSIS — Z79891 Long term (current) use of opiate analgesic: Secondary | ICD-10-CM | POA: Insufficient documentation

## 2024-03-29 DIAGNOSIS — M961 Postlaminectomy syndrome, not elsewhere classified: Secondary | ICD-10-CM | POA: Diagnosis not present

## 2024-03-29 DIAGNOSIS — M542 Cervicalgia: Secondary | ICD-10-CM | POA: Insufficient documentation

## 2024-03-29 LAB — MICROSCOPIC EXAMINATION

## 2024-03-29 LAB — URINALYSIS, ROUTINE W REFLEX MICROSCOPIC
Bilirubin, UA: NEGATIVE
Glucose, UA: NEGATIVE
Ketones, UA: NEGATIVE
Leukocytes,UA: NEGATIVE
Nitrite, UA: NEGATIVE
Specific Gravity, UA: 1.02 (ref 1.005–1.030)
Urobilinogen, Ur: 0.2 mg/dL (ref 0.2–1.0)
pH, UA: 6 (ref 5.0–7.5)

## 2024-03-29 LAB — BLADDER SCAN AMB NON-IMAGING: Scan Result: 40

## 2024-03-29 MED ORDER — SOLIFENACIN SUCCINATE 5 MG PO TABS: 5.0000 mg | ORAL_TABLET | Freq: Every day | ORAL | 11 refills | Status: DC

## 2024-03-29 MED ORDER — OXYCODONE-ACETAMINOPHEN 10-325 MG PO TABS: 1.0000 | ORAL_TABLET | ORAL | 0 refills | Status: DC | PRN

## 2024-03-29 NOTE — Progress Notes (Signed)
 Assessment: 1. Lower urinary tract symptoms   2. Prostate cancer screening     Plan: I personally reviewed the patient's chart including provider notes, and lab results. PSA today. Recommend discontinuing tamsulosin  due to potential side effects and lack of efficacy. Trial of solifenacin 5 mg daily.  Prescription sent.  Use and side effects discussed. Return to office in 4-6 weeks for reevaluation and bladder scan.  Chief Complaint:  Chief Complaint  Patient presents with   Lower Urinary Tract Symptoms    History of Present Illness:  Philip Bates is a 63 y.o. male who is seen for evaluation of lower urinary tract symptoms. He reports symptoms for approximately 1 year.  He has symptoms of frequency, voiding every 2-3 hours, nocturia x 2, urgency, and occasional urge incontinence.  He voids with a good stream and feels like he empties his bladder completely.  No dysuria or gross hematuria.  No history of UTIs or prostatitis. He has been on tamsulosin  for approximately 1 year.  He has not seen a significant change in his symptoms.  He reports some dizziness associated with the tamsulosin . IPSS = 14/4.   PSA results: 9/22 1.54  Past Medical History:  Past Medical History:  Diagnosis Date   Arthritis    Cerebral palsy (HCC)    Cervical radiculopathy    H/O umbilical hernia repair 2001   Pre-diabetes    Scoliosis    Spondylosis of cervical spine     Past Surgical History:  Past Surgical History:  Procedure Laterality Date   ANTERIOR CERVICAL DECOMPRESSION/DISCECTOMY FUSION 4 LEVELS N/A 08/11/2017   Procedure: Cervical three to Cervical seven Anterior Cervical discectomy with fusion/plate fixation;  Surgeon: Ditty, Morene Hicks, MD;  Location: [facility] OR;  Service: Neurosurgery;  Laterality: N/A;   BACK SURGERY     HERNIA REPAIR     inguinal hernia repair in early 2000   LEG SURGERY     POSTERIOR CERVICAL FUSION/FORAMINOTOMY N/A 08/12/2018   Procedure: POSTERIOR CERVICAL  FUSION WITH LATERAL MASS FIXATION CERVICAL THREE TO CERVICAL SEVEN;  Surgeon: Lanis Pupa, MD;  Location: [facility] OR;  Service: Neurosurgery;  Laterality: N/A;   SHOULDER ARTHROSCOPY WITH ROTATOR CUFF REPAIR AND SUBACROMIAL DECOMPRESSION Right 02/02/2018   Procedure: RIGHT SHOULDER ARTHROSCOPY WITH SUBACROMIAL DECOMPRESSION, MINI-OPEN ROTATOR CUFF REPAIR;  Surgeon: Addie Cordella Hamilton, MD;  Location: [facility] OR;  Service: Orthopedics;  Laterality: Right;   SHOULDER SURGERY Right    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   Procedure: LEFT TOTAL HIP ARTHROPLASTY ANTERIOR APPROACH;  Surgeon: Jerri Kay HERO, MD;  Location: [facility] OR;  Service: Orthopedics;  Laterality: Left;    Allergies:  No Known Allergies  Family History:  Family History  Problem Relation Age of Onset   Esophageal cancer Father    Cancer Father        esophagus   Diabetes Brother    Diabetes Paternal Uncle    Colon cancer Neg Hx    Rectal cancer Neg Hx    Stomach cancer Neg Hx     Social History:  Social History   Tobacco Use   Smoking status: Every Day    Current packs/day: 1.00    Average packs/day: 1 pack/day for 35.0 years (35.0 ttl pk-yrs)    Types: Cigarettes   Smokeless tobacco: Never  Vaping Use   Vaping status: Never Used  Substance Use Topics   Alcohol  use: Never   Drug use: Never    Review of symptoms:  Constitutional:  Negative for unexplained  weight loss, night sweats, fever, chills ENT:  Negative for nose bleeds, sinus pain, painful swallowing CV:  Negative for chest pain, shortness of breath, exercise intolerance, palpitations, loss of consciousness Resp:  Negative for cough, wheezing, shortness of breath GI:  Negative for nausea, vomiting, diarrhea, bloody stools GU:  Positives noted in HPI; otherwise negative for gross hematuria, dysuria Neuro:  Negative for seizures, poor balance, limb weakness, slurred speech Psych:  Negative for lack of energy, depression, anxiety Endocrine:  Negative for  polydipsia, polyuria, symptoms of hypoglycemia (dizziness, hunger, sweating) Hematologic:  Negative for anemia, purpura, petechia, prolonged or excessive bleeding, use of anticoagulants  Allergic:  Negative for difficulty breathing or choking as a result of exposure to anything; no shellfish allergy; no allergic response (rash/itch) to materials, foods  Physical exam: BP (!) 164/84   Pulse (!) 59   Ht 5' 4 (1.626 m)   Wt 115 lb (52.2 kg)   BMI 19.74 kg/m  GENERAL APPEARANCE:  Well appearing, well developed, well nourished, NAD HEENT: Atraumatic, Normocephalic, oropharynx clear. NECK: Supple without lymphadenopathy or thyromegaly. LUNGS: Clear to auscultation bilaterally. HEART: Regular Rate and Rhythm without murmurs, gallops, or rubs. ABDOMEN: Soft, non-tender, No Masses. EXTREMITIES: Moves all extremities well.  Without clubbing, cyanosis, or edema. NEUROLOGIC:  Alert and oriented x 3, normal gait, CN II-XII grossly intact.  MENTAL STATUS:  Appropriate. BACK:  Non-tender to palpation.  No CVAT SKIN:  Warm, dry and intact.   GU: Penis:  uncircumcised Meatus: Normal Scrotum: normal, no masses Testis: normal without masses bilateral Prostate: 40 g, NT, no nodules Rectum: Normal tone,  no masses or tenderness   Results: U/A: 0-5 WBCs, 0-2 RBCs  PVR = 40 ml

## 2024-03-29 NOTE — Progress Notes (Signed)
 Subjective:    Patient ID: Philip Bates, male    DOB: March 14, 1961, 63 y.o.   MRN: [id number]  HPI: Philip Bates is a 63 y.o. male who returns for follow up appointment for chronic pain and medication refill. He states his pain is located in his neck radiating into his left shoulder, right shoulder pain, lower bacl and left hip pain. He rates his pain 8. His current exercise regime is walking and performing stretching exercises.  Mr. Michelin Morphine  equivalent is 90.00 MME.   Last UDS was Performed on 01/04/2024, it was consistent.     Pain Inventory Average Pain 8 Pain Right Now 8 My pain is sharp, burning, dull, stabbing, tingling, and aching  In the last 24 hours, has pain interfered with the following? General activity 8 Relation with others 8 Enjoyment of life 8 What TIME of day is your pain at its worst? morning , daytime, evening, and night Sleep (in general) Poor  Pain is worse with: walking, bending, sitting, standing, and some activites Pain improves with: rest and medication Relief from Meds: 8  Family History  Problem Relation Age of Onset   Esophageal cancer Father    Cancer Father        esophagus   Diabetes Brother    Diabetes Paternal Uncle    Colon cancer Neg Hx    Rectal cancer Neg Hx    Stomach cancer Neg Hx    Social History   Socioeconomic History   Marital status: Married    Spouse name: Not on file   Number of children: Not on file   Years of education: Not on file   Highest education level: Some college, no degree  Occupational History   Not on file  Tobacco Use   Smoking status: Every Day    Current packs/day: 1.00    Average packs/day: 1 pack/day for 35.0 years (35.0 ttl pk-yrs)    Types: Cigarettes   Smokeless tobacco: Never  Vaping Use   Vaping status: Never Used  Substance and Sexual Activity   Alcohol  use: Never   Drug use: Never   Sexual activity: Yes  Other Topics Concern   Not on file  Social History Narrative   ** Merged  History Encounter **       Social Drivers of Health   Financial Resource Strain: Low Risk  (08/17/2023)   Overall Financial Resource Strain (CARDIA)    Difficulty of Paying Living Expenses: Not very hard  Recent Concern: Financial Resource Strain - Medium Risk (05/28/2023)   Overall Financial Resource Strain (CARDIA)    Difficulty of Paying Living Expenses: Somewhat hard  Food Insecurity: Food Insecurity Present (08/17/2023)   Hunger Vital Sign    Worried About Running Out of Food in the Last Year: Sometimes true    Ran Out of Food in the Last Year: Sometimes true  Transportation Needs: Unmet Transportation Needs (08/17/2023)   PRAPARE - Transportation    Lack of Transportation (Medical): No    Lack of Transportation (Non-Medical): Yes  Physical Activity: Insufficiently Active (08/17/2023)   Exercise Vital Sign    Days of Exercise per Week: 3 days    Minutes of Exercise per Session: 30 min  Stress: Stress Concern Present (08/17/2023)   [employer] of Occupational Health - Occupational Stress Questionnaire    Feeling of Stress : To some extent  Social Connections: Socially Integrated (08/17/2023)   Social Connection and Isolation Panel    Frequency of Communication with Friends and  Family: Three times a week    Frequency of Social Gatherings with Friends and Family: Once a week    Attends Religious Services: More than 4 times per year    Active Member of [employer] or Organizations: Yes    Attends Engineer, structural: More than 4 times per year    Marital Status: Married  Recent Concern: Social Connections - Moderately Isolated (05/28/2023)   Social Connection and Isolation Panel    Frequency of Communication with Friends and Family: Once a week    Frequency of Social Gatherings with Friends and Family: Once a week    Attends Religious Services: 1 to 4 times per year    Active Member of [employer] or Organizations: No    Attends Engineer, structural: Not on file     Marital Status: Married   Past Surgical History:  Procedure Laterality Date   ANTERIOR CERVICAL DECOMPRESSION/DISCECTOMY FUSION 4 LEVELS N/A 08/11/2017   Procedure: Cervical three to Cervical seven Anterior Cervical discectomy with fusion/plate fixation;  Surgeon: Ditty, Morene Hicks, MD;  Location: [facility] OR;  Service: Neurosurgery;  Laterality: N/A;   BACK SURGERY     HERNIA REPAIR     inguinal hernia repair in early 2000   LEG SURGERY     POSTERIOR CERVICAL FUSION/FORAMINOTOMY N/A 08/12/2018   Procedure: POSTERIOR CERVICAL FUSION WITH LATERAL MASS FIXATION CERVICAL THREE TO CERVICAL SEVEN;  Surgeon: Lanis Pupa, MD;  Location: [facility] OR;  Service: Neurosurgery;  Laterality: N/A;   SHOULDER ARTHROSCOPY WITH ROTATOR CUFF REPAIR AND SUBACROMIAL DECOMPRESSION Right 02/02/2018   Procedure: RIGHT SHOULDER ARTHROSCOPY WITH SUBACROMIAL DECOMPRESSION, MINI-OPEN ROTATOR CUFF REPAIR;  Surgeon: Addie Cordella Hamilton, MD;  Location: [facility] OR;  Service: Orthopedics;  Laterality: Right;   SHOULDER SURGERY Right    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   Procedure: LEFT TOTAL HIP ARTHROPLASTY ANTERIOR APPROACH;  Surgeon: Jerri Kay HERO, MD;  Location: [facility] OR;  Service: Orthopedics;  Laterality: Left;   Past Surgical History:  Procedure Laterality Date   ANTERIOR CERVICAL DECOMPRESSION/DISCECTOMY FUSION 4 LEVELS N/A 08/11/2017   Procedure: Cervical three to Cervical seven Anterior Cervical discectomy with fusion/plate fixation;  Surgeon: Ditty, Morene Hicks, MD;  Location: [facility] OR;  Service: Neurosurgery;  Laterality: N/A;   BACK SURGERY     HERNIA REPAIR     inguinal hernia repair in early 2000   LEG SURGERY     POSTERIOR CERVICAL FUSION/FORAMINOTOMY N/A 08/12/2018   Procedure: POSTERIOR CERVICAL FUSION WITH LATERAL MASS FIXATION CERVICAL THREE TO CERVICAL SEVEN;  Surgeon: Lanis Pupa, MD;  Location: [facility] OR;  Service: Neurosurgery;  Laterality: N/A;   SHOULDER ARTHROSCOPY WITH ROTATOR CUFF REPAIR AND  SUBACROMIAL DECOMPRESSION Right 02/02/2018   Procedure: RIGHT SHOULDER ARTHROSCOPY WITH SUBACROMIAL DECOMPRESSION, MINI-OPEN ROTATOR CUFF REPAIR;  Surgeon: Addie Cordella Hamilton, MD;  Location: [facility] OR;  Service: Orthopedics;  Laterality: Right;   SHOULDER SURGERY Right    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   Procedure: LEFT TOTAL HIP ARTHROPLASTY ANTERIOR APPROACH;  Surgeon: Jerri Kay HERO, MD;  Location: [facility] OR;  Service: Orthopedics;  Laterality: Left;   Past Medical History:  Diagnosis Date   Arthritis    Cerebral palsy (HCC)    Cervical radiculopathy    H/O umbilical hernia repair 2001   Pre-diabetes    Scoliosis    Spondylosis of cervical spine    BP 137/82 (BP Location: Left Arm, Patient Position: Sitting, Cuff Size: Large)   Pulse 63   Ht 5' 4 (1.626 m)  Wt 124 lb (56.2 kg)   SpO2 99%   BMI 21.28 kg/m   Opioid Risk Score:   Fall Risk Score:  `1  Depression screen PHQ 2/9     03/29/2024    1:21 PM 03/03/2024    9:52 AM 12/02/2023    1:45 PM 11/05/2023   10:58 AM 09/07/2023    1:30 PM 07/06/2023    2:45 PM 06/12/2023   10:28 AM  Depression screen PHQ 2/9  Decreased Interest  1 0 0 1 1 0  Down, Depressed, Hopeless 0 1 0 0 1 1 0  PHQ - 2 Score 0 2 0 0 2 2 0  Altered sleeping 0 1     0  Tired, decreased energy 0 1     0  Change in appetite 0 0     0  Feeling bad or failure about yourself  0 0     0  Trouble concentrating 0 0     0  Moving slowly or fidgety/restless 0 0     0  Suicidal thoughts 0 0     0  PHQ-9 Score 0 4     0  Difficult doing work/chores Somewhat difficult Somewhat difficult     Not difficult at all     Review of Systems  Musculoskeletal:  Positive for arthralgias, back pain, joint swelling and neck pain.       Right shoulder pain, lower back pain, left hip and knee pain, neck pain  All other systems reviewed and are negative.      Objective:   Physical Exam Vitals and nursing note reviewed.  Constitutional:      Appearance: Normal appearance.    Cardiovascular:     Rate and Rhythm: Normal rate and regular rhythm.     Pulses: Normal pulses.     Heart sounds: Normal heart sounds.  Pulmonary:     Effort: Pulmonary effort is normal.     Breath sounds: Normal breath sounds.   Musculoskeletal:     Comments: Normal Muscle Bulk and Muscle Testing Reveals:  Upper Extremities: Full ROM and Muscle Strength 5/5 Bilateral AC Joint  Tenderness   Lumbar Paraspinal Tenderness: L-4-L-5 Lower Extremities: Full ROM and Muscle Strength 5/5 Wearing Left AFO Arises from table slowly using cane for support Antalgic Gait      Skin:    General: Skin is warm and dry.   Neurological:     Mental Status: He is alert and oriented to person, place, and time.   Psychiatric:        Mood and Affect: Mood normal.        Behavior: Behavior normal.         Assessment & Plan:  Cervicalgia/ Cervical Radiculitis: Continue HEP as Tolerated. Continue to Monitor. Continue Gabapentin . Continue to Monitor. 03/29/2024 Chronic Bilateral;  Shoulder Pain:  Continue HEP as Tolerated. Continue to Monitor. Ortho Following. 03/29/2024 Failed Back Syndrome: Continue HEP as Tolerated. Continue to Monitor. 03/29/2024 Chronic Bilateral Low Back Pain without Sciatica: Continue HEP as Tolerated. Continue current medication regimen. Continue to Monitor. 03/29/2024 Chronic Left Hip Pain: S/P On 09/10/2020: Dr Jerri LEFT TOTAL HIP ARTHROPLASTY ANTERIOR APPROACH Continue HEP as Tolerated. Continue current medication regimen. Continue to Monitor. 03/29/2024 Chronic Left knee Pain: Continue HEP as Tolerated. Continue to Monitor. No complaints today. Continue to Monitor. 03/29/2024 Left Foot Drop: Wearing AFO: [employer] Following. Continue to Monitor. 03/29/2024 Chronic Pain Syndrome: Refilled Oxycodone  10/325 mg one tablet every 4 hours as  needed for pain #180.  We will continue the opioid monitoring program, this consists of regular clinic visits, examinations, urine drug screen,  pill counts as well as use of [place]  Controlled Substance Reporting system. A 12 month History has been reviewed on the [place]  Controlled Substance Reporting System on 03/29/2024   F/U in 1 month

## 2024-03-30 ENCOUNTER — Ambulatory Visit: Payer: Self-pay | Admitting: Urology

## 2024-03-30 LAB — PSA: Prostate Specific Ag, Serum: 1.3 ng/mL (ref 0.0–4.0)

## 2024-04-01 DIAGNOSIS — M7062 Trochanteric bursitis, left hip: Secondary | ICD-10-CM | POA: Diagnosis not present

## 2024-04-01 DIAGNOSIS — Z96642 Presence of left artificial hip joint: Secondary | ICD-10-CM | POA: Diagnosis not present

## 2024-04-07 ENCOUNTER — Other Ambulatory Visit: Payer: Self-pay | Admitting: Physical Medicine and Rehabilitation

## 2024-04-27 ENCOUNTER — Encounter: Attending: Physical Medicine and Rehabilitation | Admitting: Registered Nurse

## 2024-04-27 ENCOUNTER — Encounter: Payer: Self-pay | Admitting: Registered Nurse

## 2024-04-27 VITALS — BP 155/89 | HR 62 | Ht 64.0 in | Wt 120.0 lb

## 2024-04-27 DIAGNOSIS — M542 Cervicalgia: Secondary | ICD-10-CM | POA: Insufficient documentation

## 2024-04-27 DIAGNOSIS — M5412 Radiculopathy, cervical region: Secondary | ICD-10-CM | POA: Insufficient documentation

## 2024-04-27 DIAGNOSIS — G894 Chronic pain syndrome: Secondary | ICD-10-CM | POA: Insufficient documentation

## 2024-04-27 DIAGNOSIS — M961 Postlaminectomy syndrome, not elsewhere classified: Secondary | ICD-10-CM | POA: Diagnosis not present

## 2024-04-27 DIAGNOSIS — Z5181 Encounter for therapeutic drug level monitoring: Secondary | ICD-10-CM | POA: Insufficient documentation

## 2024-04-27 DIAGNOSIS — M545 Low back pain, unspecified: Secondary | ICD-10-CM | POA: Diagnosis not present

## 2024-04-27 DIAGNOSIS — M25511 Pain in right shoulder: Secondary | ICD-10-CM | POA: Diagnosis not present

## 2024-04-27 DIAGNOSIS — M25562 Pain in left knee: Secondary | ICD-10-CM | POA: Diagnosis not present

## 2024-04-27 DIAGNOSIS — Z79891 Long term (current) use of opiate analgesic: Secondary | ICD-10-CM | POA: Insufficient documentation

## 2024-04-27 DIAGNOSIS — G8929 Other chronic pain: Secondary | ICD-10-CM | POA: Diagnosis not present

## 2024-04-27 DIAGNOSIS — M25512 Pain in left shoulder: Secondary | ICD-10-CM | POA: Insufficient documentation

## 2024-04-27 DIAGNOSIS — M25552 Pain in left hip: Secondary | ICD-10-CM | POA: Insufficient documentation

## 2024-04-27 MED ORDER — OXYCODONE-ACETAMINOPHEN 10-325 MG PO TABS: 1.0000 | ORAL_TABLET | ORAL | 0 refills | Status: DC | PRN

## 2024-04-27 NOTE — Progress Notes (Unsigned)
 Subjective:    Patient ID: Philip Bates, male    DOB: 05-Apr-1961, 63 y.o.   MRN: [id number]  HPI: Philip Bates is a 64 y.o. male who returns for follow up appointment for chronic pain and medication refill. He states his  pain is located in his neck radiating into his left shoulder, lower back, left hip and bilateral knee pain. She rates her pain 7.Her  current exercise regime is walking and performing stretching exercises.  [name] Morphine  equivalent is 90.00 MME.   UDS ordered today.    Pain Inventory Average Pain 9 Pain Right Now 7 My pain is sharp, burning, dull, stabbing, tingling, and aching  In the last 24 hours, has pain interfered with the following? General activity 7 Relation with others 7 Enjoyment of life 7 What TIME of day is your pain at its worst? morning , daytime, evening, and night Sleep (in general) Poor  Pain is worse with: walking, bending, sitting, standing, and some activites Pain improves with: medication Relief from Meds: 9  Family History  Problem Relation Age of Onset   Esophageal cancer Father    Cancer Father        esophagus   Diabetes Brother    Diabetes Paternal Uncle    Colon cancer Neg Hx    Rectal cancer Neg Hx    Stomach cancer Neg Hx    Social History   Socioeconomic History   Marital status: Married    Spouse name: Not on file   Number of children: Not on file   Years of education: Not on file   Highest education level: Some college, no degree  Occupational History   Not on file  Tobacco Use   Smoking status: Every Day    Current packs/day: 1.00    Average packs/day: 1 pack/day for 35.0 years (35.0 ttl pk-yrs)    Types: Cigarettes   Smokeless tobacco: Never  Vaping Use   Vaping status: Never Used  Substance and Sexual Activity   Alcohol  use: Never   Drug use: Never   Sexual activity: Yes  Other Topics Concern   Not on file  Social History Narrative   ** Merged History Encounter **       Social Drivers of Health    Financial Resource Strain: High Risk (04/26/2024)   Overall Financial Resource Strain (CARDIA)    Difficulty of Paying Living Expenses: Hard  Food Insecurity: Food Insecurity Present (04/26/2024)   Hunger Vital Sign    Worried About Running Out of Food in the Last Year: Often true    Ran Out of Food in the Last Year: Sometimes true  Transportation Needs: Unmet Transportation Needs (04/26/2024)   PRAPARE - Transportation    Lack of Transportation (Medical): No    Lack of Transportation (Non-Medical): Yes  Physical Activity: Insufficiently Active (08/17/2023)   Exercise Vital Sign    Days of Exercise per Week: 3 days    Minutes of Exercise per Session: 30 min  Stress: Stress Concern Present (08/17/2023)   [employer] of Occupational Health - Occupational Stress Questionnaire    Feeling of Stress : To some extent  Social Connections: Unknown (04/26/2024)   Social Connection and Isolation Panel    Frequency of Communication with Friends and Family: Twice a week    Frequency of Social Gatherings with Friends and Family: Not on file    Attends Religious Services: 1 to 4 times per year    Active Member of [employer] or Organizations: Not  on file    Attends Club or Organization Meetings: Not on file    Marital Status: Married   Past Surgical History:  Procedure Laterality Date   ANTERIOR CERVICAL DECOMPRESSION/DISCECTOMY FUSION 4 LEVELS N/A 08/11/2017   Procedure: Cervical three to Cervical seven Anterior Cervical discectomy with fusion/plate fixation;  Surgeon: Ditty, Morene Hicks, MD;  Location: [facility] OR;  Service: Neurosurgery;  Laterality: N/A;   BACK SURGERY     HERNIA REPAIR     inguinal hernia repair in early 2000   LEG SURGERY     POSTERIOR CERVICAL FUSION/FORAMINOTOMY N/A 08/12/2018   Procedure: POSTERIOR CERVICAL FUSION WITH LATERAL MASS FIXATION CERVICAL THREE TO CERVICAL SEVEN;  Surgeon: Lanis Pupa, MD;  Location: [facility] OR;  Service: Neurosurgery;  Laterality: N/A;    SHOULDER ARTHROSCOPY WITH ROTATOR CUFF REPAIR AND SUBACROMIAL DECOMPRESSION Right 02/02/2018   Procedure: RIGHT SHOULDER ARTHROSCOPY WITH SUBACROMIAL DECOMPRESSION, MINI-OPEN ROTATOR CUFF REPAIR;  Surgeon: Addie Cordella Hamilton, MD;  Location: [facility] OR;  Service: Orthopedics;  Laterality: Right;   SHOULDER SURGERY Right    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   Procedure: LEFT TOTAL HIP ARTHROPLASTY ANTERIOR APPROACH;  Surgeon: Jerri Kay HERO, MD;  Location: [facility] OR;  Service: Orthopedics;  Laterality: Left;   Past Surgical History:  Procedure Laterality Date   ANTERIOR CERVICAL DECOMPRESSION/DISCECTOMY FUSION 4 LEVELS N/A 08/11/2017   Procedure: Cervical three to Cervical seven Anterior Cervical discectomy with fusion/plate fixation;  Surgeon: Ditty, Morene Hicks, MD;  Location: [facility] OR;  Service: Neurosurgery;  Laterality: N/A;   BACK SURGERY     HERNIA REPAIR     inguinal hernia repair in early 2000   LEG SURGERY     POSTERIOR CERVICAL FUSION/FORAMINOTOMY N/A 08/12/2018   Procedure: POSTERIOR CERVICAL FUSION WITH LATERAL MASS FIXATION CERVICAL THREE TO CERVICAL SEVEN;  Surgeon: Lanis Pupa, MD;  Location: [facility] OR;  Service: Neurosurgery;  Laterality: N/A;   SHOULDER ARTHROSCOPY WITH ROTATOR CUFF REPAIR AND SUBACROMIAL DECOMPRESSION Right 02/02/2018   Procedure: RIGHT SHOULDER ARTHROSCOPY WITH SUBACROMIAL DECOMPRESSION, MINI-OPEN ROTATOR CUFF REPAIR;  Surgeon: Addie Cordella Hamilton, MD;  Location: [facility] OR;  Service: Orthopedics;  Laterality: Right;   SHOULDER SURGERY Right    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   Procedure: LEFT TOTAL HIP ARTHROPLASTY ANTERIOR APPROACH;  Surgeon: Jerri Kay HERO, MD;  Location: [facility] OR;  Service: Orthopedics;  Laterality: Left;   Past Medical History:  Diagnosis Date   Arthritis    Cerebral palsy (HCC)    Cervical radiculopathy    H/O umbilical hernia repair 2001   Pre-diabetes    Scoliosis    Spondylosis of cervical spine    BP (!) 150/91 (BP Location: Left  Arm, Patient Position: Sitting, Cuff Size: Normal) Comment: Second BP rading  Pulse 62   Ht 5' 4 (1.626 m)   Wt 120 lb (54.4 kg)   SpO2 98%   BMI 20.60 kg/m   Opioid Risk Score:   Fall Risk Score:  `1  Depression screen [facility] 2/9     04/27/2024   10:24 AM 03/29/2024    1:21 PM 03/03/2024    9:52 AM 12/02/2023    1:45 PM 11/05/2023   10:58 AM 09/07/2023    1:30 PM 07/06/2023    2:45 PM  Depression screen PHQ 2/9  Decreased Interest 0  1 0 0 1 1  Down, Depressed, Hopeless 0 0 1 0 0 1 1  PHQ - 2 Score 0 0 2 0 0 2 2  Altered sleeping 0 0 1  Tired, decreased energy 0 0 1      Change in appetite 0 0 0      Feeling bad or failure about yourself  0 0 0      Trouble concentrating 0 0 0      Moving slowly or fidgety/restless 0 0 0      Suicidal thoughts 0 0 0      PHQ-9 Score 0 0 4      Difficult doing work/chores  Somewhat difficult Somewhat difficult          Review of Systems  Musculoskeletal:  Positive for back pain, joint swelling and myalgias.       Bilateral shoulder pain, low back pain, bilateral knee pain, left foot pain  All other systems reviewed and are negative.      Objective:   Physical Exam Constitutional:      Appearance: Normal appearance.  Neck:     Comments: Cervical Paraspinal Tenderness: C-5-C-6 Cardiovascular:     Rate and Rhythm: Normal rate.     Pulses: Normal pulses.     Heart sounds: Normal heart sounds.  Pulmonary:     Effort: Pulmonary effort is normal.     Breath sounds: Normal breath sounds.  Musculoskeletal:     Comments: Normal Muscle Bulk and Muscle  Testing Reveals: Upper Extremities:ull  ROM and Muscle Strength  5/5 Bilateral AC Joint Tenderness  Lumbar Paraspinal Tenderness: L-4-L-5 Left Greater Trochanter Tenderness Lower Extremities: Right Full ROM and Muscle Strength 5/5 Left Lower Extremity: Decreased ROM and Muscle Strength 5/5 Wearing AFO Arises from Table slowly using cane for support Antalgic  Gait     Skin:     General: Skin is warm and dry.  Neurological:     Mental Status: He is alert and oriented to person, place, and time.  Psychiatric:        Mood and Affect: Mood normal.        Behavior: Behavior normal.          Assessment & Plan:  Cervicalgia/ Cervical Radiculitis: Continue HEP as Tolerated. Continue to Monitor. Continue Gabapentin . Continue to Monitor. 04/27/2024 Chronic Bilateral;  Shoulder Pain:  Continue HEP as Tolerated. Continue to Monitor. Ortho Following. 04/27/2024 Failed Back Syndrome: Continue HEP as Tolerated. Continue to Monitor. 04/27/2024 Chronic Bilateral Low Back Pain without Sciatica: Continue HEP as Tolerated. Continue current medication regimen. Continue to Monitor. 04/27/2024 Chronic Left Hip Pain: S/P On 09/10/2020: Dr Jerri LEFT TOTAL HIP ARTHROPLASTY ANTERIOR APPROACH Continue HEP as Tolerated. Continue current medication regimen. Continue to Monitor. 04/27/2024 Chronic Left knee Pain: Continue HEP as Tolerated. Continue to Monitor. No complaints today. Continue to Monitor. 04/27/2024 Left Foot Drop: Wearing AFO: [employer] Following. Continue to Monitor. 04/27/2024 Chronic Pain Syndrome: Refilled Oxycodone  10/325 mg one tablet every 4 hours as needed for pain #180.  We will continue the opioid monitoring program, this consists of regular clinic visits, examinations, urine drug screen, pill counts as well as use of [place]  Controlled Substance Reporting system. A 12 month History has been reviewed on the [place]  Controlled Substance Reporting System on 04/27/2024   F/U in 1 month

## 2024-04-29 ENCOUNTER — Ambulatory Visit (INDEPENDENT_AMBULATORY_CARE_PROVIDER_SITE_OTHER): Admitting: Family Medicine

## 2024-04-29 ENCOUNTER — Encounter: Payer: Self-pay | Admitting: Family Medicine

## 2024-04-29 ENCOUNTER — Ambulatory Visit: Admitting: Family Medicine

## 2024-04-29 VITALS — BP 120/80 | HR 75 | Resp 16 | Ht 64.0 in | Wt 120.0 lb

## 2024-04-29 DIAGNOSIS — Z122 Encounter for screening for malignant neoplasm of respiratory organs: Secondary | ICD-10-CM | POA: Diagnosis not present

## 2024-04-29 DIAGNOSIS — Z23 Encounter for immunization: Secondary | ICD-10-CM

## 2024-04-29 DIAGNOSIS — Z020 Encounter for examination for admission to educational institution: Secondary | ICD-10-CM

## 2024-04-29 NOTE — Patient Instructions (Addendum)
 A few things to remember from today's visit:  Screening for lung cancer - Plan: CT CHEST LUNG CA SCREEN LOW DOSE W/O CM  School health examination  Letter provided today. Please schedule chest CT for lung cancer screening. I placed a new order in case it is needed.  Do not use My Chart to request refills or for acute issues that need immediate attention. If you send a my chart message, it may take a few days to be addressed, specially if I am not in the office.  Please be sure medication list is accurate. If a new problem present, please set up appointment sooner than planned today.

## 2024-04-29 NOTE — Progress Notes (Signed)
 Chief Complaint  Patient presents with   Letter for School/Work   HPI: [name] is a 63 y.o. male with past medical history of CP with multiple orthopedic surgeries and contractures, prediabetes, tobacco use disorder, and chronic opioid use who is here today requesting a letter, so he can continue attending school.  He is working on an associates degree in psychology. Requesting a letter for school that states he is able to attend online classes.  He provided a letter in the past, but his school has let him know it is outdated.  Pt denies any symptoms of cognitive or psychiatric impairments.   Continues to f/u with pain management and is seeing a new orthopedic provider for hip pain.  He is also followed by urology for BPH with nocturia.   He is working on smoking cessation and reports his cut date is tomorrow.  Pt has been using nicotine  patches to gradually assist with this. Low-density chest CT has been ordered for lung cancer screening, he did not call back to arrange appointment.  Lab Results  Component Value Date   CHOL 134 04/30/2022   HDL 47.00 04/30/2022   LDLCALC 61 04/30/2022   TRIG 130.0 04/30/2022   CHOLHDL 3 04/30/2022   Lab Results  Component Value Date   HGBA1C 5.6 03/04/2023   Lab Results  Component Value Date   NA 138 01/05/2024   CL 104 01/05/2024   K 3.7 01/05/2024   CO2 30 01/05/2024   BUN 13 01/05/2024   CREATININE 0.67 01/05/2024   GFRNONAA >60 01/05/2024   CALCIUM 9.3 01/05/2024   ALBUMIN  4.3 01/05/2024   GLUCOSE 104 (H) 01/05/2024   Review of Systems  Constitutional:  Negative for activity change, appetite change and fever.  HENT:  Negative for sore throat.   Respiratory:  Negative for cough, shortness of breath and wheezing.   Cardiovascular:  Negative for chest pain and leg swelling.  Gastrointestinal:  Negative for abdominal pain, nausea and vomiting.  Genitourinary:  Negative for decreased urine volume and hematuria.   Musculoskeletal:  Positive for arthralgias, back pain and neck pain.  Skin:  Negative for rash.  Neurological:  Negative for syncope and headaches.  Psychiatric/Behavioral:  Negative for confusion and hallucinations.   See other pertinent positives and negatives in HPI.  Current Outpatient Medications on File Prior to Visit  Medication Sig Dispense Refill   aspirin  EC 81 MG tablet Take 1 tablet (81 mg total) by mouth 2 (two) times daily. 84 tablet 0   ergocalciferol (VITAMIN D2) 1.25 MG (50000 UT) capsule 1 (ONE) CAPSULE CAPSULE BY MOUTH ONCE A WEEK     ferrous sulfate  325 (65 FE) MG tablet Take 1 tablet (325 mg total) by mouth daily with breakfast. Please take with a source of Vitamin C 90 tablet 3   gabapentin  (NEURONTIN ) 400 MG capsule Take 1 capsule (400 mg total) by mouth 3 (three) times daily. 90 capsule 3   ibuprofen  (ADVIL ) 800 MG tablet TAKE 1 TABLET BY MOUTH EVERY 8 HOURS AS NEEDED 30 tablet 0   latanoprost (XALATAN) 0.005 % ophthalmic solution SMARTSIG:1 Drop(s) In Eye(s) Every Evening     NARCAN 4 MG/0.1ML LIQD nasal spray kit Place 1 spray into the nose as needed (opioid overdose).     oxyCODONE -acetaminophen  (PERCOCET) 10-325 MG tablet Take 1 tablet by mouth every 4 (four) hours as needed. 180 tablet 0   sildenafil  (REVATIO ) 20 MG tablet TAKE 2 TO 3 TABLETS BY MOUTH ONCE DAILY  AS NEEDED 90 tablet 3   solifenacin  (VESICARE ) 5 MG tablet Take 1 tablet (5 mg total) by mouth daily. 30 tablet 11   tamsulosin  (FLOMAX ) 0.4 MG CAPS capsule Take 1 capsule (0.4 mg total) by mouth daily after supper. 90 capsule 3   No current facility-administered medications on file prior to visit.    Past Medical History:  Diagnosis Date   Arthritis    Cerebral palsy (HCC)    Cervical radiculopathy    H/O umbilical hernia repair 2001   Pre-diabetes    Scoliosis    Spondylosis of cervical spine    No Known Allergies  Social History   Socioeconomic History   Marital status: Married     Spouse name: Not on file   Number of children: Not on file   Years of education: Not on file   Highest education level: Some college, no degree  Occupational History   Not on file  Tobacco Use   Smoking status: Every Day    Current packs/day: 1.00    Average packs/day: 1 pack/day for 35.0 years (35.0 ttl pk-yrs)    Types: Cigarettes   Smokeless tobacco: Never  Vaping Use   Vaping status: Never Used  Substance and Sexual Activity   Alcohol  use: Never   Drug use: Never   Sexual activity: Yes  Other Topics Concern   Not on file  Social History Narrative   ** Merged History Encounter **       Social Drivers of Health   Financial Resource Strain: High Risk (04/26/2024)   Overall Financial Resource Strain (CARDIA)    Difficulty of Paying Living Expenses: Hard  Food Insecurity: Food Insecurity Present (04/26/2024)   Hunger Vital Sign    Worried About Running Out of Food in the Last Year: Often true    Ran Out of Food in the Last Year: Sometimes true  Transportation Needs: Unmet Transportation Needs (04/26/2024)   PRAPARE - Transportation    Lack of Transportation (Medical): No    Lack of Transportation (Non-Medical): Yes  Physical Activity: Insufficiently Active (08/17/2023)   Exercise Vital Sign    Days of Exercise per Week: 3 days    Minutes of Exercise per Session: 30 min  Stress: Stress Concern Present (08/17/2023)   [employer] of Occupational Health - Occupational Stress Questionnaire    Feeling of Stress : To some extent  Social Connections: Unknown (04/26/2024)   Social Connection and Isolation Panel    Frequency of Communication with Friends and Family: Twice a week    Frequency of Social Gatherings with Friends and Family: Not on file    Attends Religious Services: 1 to 4 times per year    Active Member of [employer] or Organizations: Not on file    Attends Banker Meetings: Not on file    Marital Status: Married    Vitals:   04/29/24 1023  BP:  120/80  Pulse: 75  Resp: 16  SpO2: 99%   Body mass index is 20.6 kg/m.  Physical Exam Vitals and nursing note reviewed.  Constitutional:      General: He is not in acute distress.    Appearance: He is well-developed.  HENT:     Head: Normocephalic and atraumatic.     Mouth/Throat:     Mouth: Mucous membranes are moist.  Eyes:     Conjunctiva/sclera: Conjunctivae normal.  Cardiovascular:     Rate and Rhythm: Normal rate and regular rhythm.     Heart sounds:  No murmur heard. Pulmonary:     Effort: Pulmonary effort is normal. No respiratory distress.     Breath sounds: Normal breath sounds.  Abdominal:     Palpations: Abdomen is soft. There is no mass.     Tenderness: There is no abdominal tenderness.  Musculoskeletal:     Comments: LE contractures, leg length discrepancy,and limping.  Skin:    General: Skin is warm.     Findings: No erythema or rash.  Neurological:     Mental Status: He is alert and oriented to person, place, and time.     Cranial Nerves: No cranial nerve deficit.     Comments: Antalgic gait, unstable,assisted by a cane.  Psychiatric:        Mood and Affect: Mood and affect normal.    ASSESSMENT AND PLAN: [name] was seen today for a school letter for cognitive evaluation    Orders Placed This Encounter  Procedures   CT CHEST LUNG CA SCREEN LOW DOSE W/O CM   Pneumococcal conjugate vaccine 20-valent (Prevnar 20)   School health examination Attended online classes to finish his associates degree since psychology.  Negative for cognitive or psychiatry at disorder that precluded him from continuing online education. Letter provided for school.  Screening for lung cancer -     CT CHEST LUNG CANCER SCREENING LOW DOSE WO CONTRAST; Future  Need for pneumococcal vaccination -     Pneumococcal conjugate vaccine 20-valent  Health maintenance: He has not had lung cancer screening, new order placed. Prevnar 20 given today.  I spent a total of  19 minutes in both face to face and non face to face activities for this visit on the date of this encounter. During this time history was obtained and documented, examination was performed, prior labs reviewed, and assessment/plan discussed.  Return if symptoms worsen or fail to improve.  I, Vernell Forest, acting as a scribe for [place] Nolton [place], MD., have documented all relevant documentation on the behalf of Tristin Vandeusen [place], MD, as directed by   while in the presence of Elton Catalano [place], MD.  I, Kaisley Stiverson [place], MD, have reviewed all documentation for this visit. The documentation on 04/29/24 for the exam, diagnosis, procedures, and orders are all accurate and complete.  Tyronda Vizcarrondo G. [place], MD  [facility]. [place] [facility]

## 2024-05-03 LAB — TOXASSURE SELECT,+ANTIDEPR,UR

## 2024-05-09 ENCOUNTER — Ambulatory Visit (INDEPENDENT_AMBULATORY_CARE_PROVIDER_SITE_OTHER): Admitting: Family Medicine

## 2024-05-09 ENCOUNTER — Encounter: Payer: Self-pay | Admitting: Family Medicine

## 2024-05-09 ENCOUNTER — Ambulatory Visit: Admitting: Family Medicine

## 2024-05-09 VITALS — BP 120/80 | HR 65 | Resp 16 | Ht 64.0 in | Wt 119.5 lb

## 2024-05-09 DIAGNOSIS — M4722 Other spondylosis with radiculopathy, cervical region: Secondary | ICD-10-CM | POA: Diagnosis not present

## 2024-05-09 DIAGNOSIS — R519 Headache, unspecified: Secondary | ICD-10-CM | POA: Diagnosis not present

## 2024-05-09 DIAGNOSIS — G809 Cerebral palsy, unspecified: Secondary | ICD-10-CM | POA: Diagnosis not present

## 2024-05-09 MED ORDER — PREDNISONE 20 MG PO TABS: ORAL_TABLET | ORAL | 0 refills | Status: AC

## 2024-05-09 NOTE — Progress Notes (Signed)
 ACUTE VISIT Chief Complaint  Patient presents with   Headache    X about a week    Discussed the use of AI scribe software for clinical note transcription with the patient, who gave verbal consent to proceed.  History of Present Illness Philip Bates is a 63 year old male with past medical history significant for CP with multiple orthopedic surgeries and contractures, prediabetes, tobacco use disorder, and chronic pain on chronic opioid use who presents with a new onset headache for one week.  He has been experiencing a headache for the past week, located in the occipital region and described as a pressure pain. The headache is intermittent, with a severity of 7 out of 10, and sometimes disrupts his sleep, although he has had sleep issues for a while (insomnia). He has been taking Tylenol  for the headache and also uses Percocet 10-325 mg every 4 hours as needed from a pain management, which he has been on for a while.  No recent injury, fever, chills, or changes in appetite.  No unusual weakness, numbness, nausea, or vomiting associated with the headache.  CT head  -- 12/12/2021: IMPRESSION: 1. No acute intracranial abnormality. No skull fracture. 2. Remote right zygomatic arch fracture.  He has a history of minimal anemia, for which he has been seen by hematology/oncology. His last blood work was in April/2025, which included a CBC, iron studies, and CMP.  Lab Results  Component Value Date   NA 138 01/05/2024   CL 104 01/05/2024   K 3.7 01/05/2024   CO2 30 01/05/2024   BUN 13 01/05/2024   CREATININE 0.67 01/05/2024   GFRNONAA >60 01/05/2024   CALCIUM 9.3 01/05/2024   ALBUMIN  4.3 01/05/2024   GLUCOSE 104 (H) 01/05/2024   Lab Results  Component Value Date   WBC 5.2 01/05/2024   HGB 12.4 (L) 01/05/2024   HCT 35.5 (L) 01/05/2024   MCV 80.1 01/05/2024   PLT 184 01/05/2024   Lab Results  Component Value Date   ALT 19 01/05/2024   AST 21 01/05/2024   ALKPHOS 59 01/05/2024    BILITOT 0.3 01/05/2024   Since his last visit solifenacin  5 mg was started by urologist for bladder issues.  No other new medication.  He smokes and is attempting to quit.  Has an appointment for low-density chest CT for lung cancer screening.  -He also complains of intermittent left shoulder pain, ongoing for several months.  Reports occasional associated numbness of the left arm.  Pain does not radiate down the arm.  The pain worsens when he is lying on his left side.   Review of Systems  Constitutional:  Negative for activity change and appetite change.  HENT:  Negative for sore throat.   Eyes:  Negative for photophobia and visual disturbance.  Respiratory:  Negative for cough, shortness of breath and wheezing.   Cardiovascular:  Negative for chest pain and palpitations.  Gastrointestinal:  Negative for abdominal pain.  Genitourinary:  Negative for decreased urine volume and dysuria.  Musculoskeletal:  Positive for arthralgias, back pain, gait problem and neck pain.  Skin:  Negative for rash.  Neurological:  Negative for syncope and facial asymmetry.  See other pertinent positives and negatives in HPI.  Current Outpatient Medications on File Prior to Visit  Medication Sig Dispense Refill   aspirin  EC 81 MG tablet Take 1 tablet (81 mg total) by mouth 2 (two) times daily. 84 tablet 0   ergocalciferol (VITAMIN D2) 1.25 MG (50000 UT)  capsule 1 (ONE) CAPSULE CAPSULE BY MOUTH ONCE A WEEK     ferrous sulfate  325 (65 FE) MG tablet Take 1 tablet (325 mg total) by mouth daily with breakfast. Please take with a source of Vitamin C 90 tablet 3   gabapentin  (NEURONTIN ) 400 MG capsule Take 1 capsule (400 mg total) by mouth 3 (three) times daily. 90 capsule 3   ibuprofen  (ADVIL ) 800 MG tablet TAKE 1 TABLET BY MOUTH EVERY 8 HOURS AS NEEDED 30 tablet 0   latanoprost (XALATAN) 0.005 % ophthalmic solution SMARTSIG:1 Drop(s) In Eye(s) Every Evening     NARCAN 4 MG/0.1ML LIQD nasal spray kit Place 1  spray into the nose as needed (opioid overdose).     oxyCODONE -acetaminophen  (PERCOCET) 10-325 MG tablet Take 1 tablet by mouth every 4 (four) hours as needed. 180 tablet 0   sildenafil  (REVATIO ) 20 MG tablet TAKE 2 TO 3 TABLETS BY MOUTH ONCE DAILY AS NEEDED 90 tablet 3   solifenacin  (VESICARE ) 5 MG tablet Take 1 tablet (5 mg total) by mouth daily. 30 tablet 11   tamsulosin  (FLOMAX ) 0.4 MG CAPS capsule Take 1 capsule (0.4 mg total) by mouth daily after supper. 90 capsule 3   No current facility-administered medications on file prior to visit.   Past Medical History:  Diagnosis Date   Arthritis    Cerebral palsy (HCC)    Cervical radiculopathy    H/O umbilical hernia repair 2001   Pre-diabetes    Scoliosis    Spondylosis of cervical spine    No Known Allergies  Social History   Socioeconomic History   Marital status: Married    Spouse name: Not on file   Number of children: Not on file   Years of education: Not on file   Highest education level: Some college, no degree  Occupational History   Not on file  Tobacco Use   Smoking status: Every Day    Current packs/day: 1.00    Average packs/day: 1 pack/day for 35.0 years (35.0 ttl pk-yrs)    Types: Cigarettes   Smokeless tobacco: Never  Vaping Use   Vaping status: Never Used  Substance and Sexual Activity   Alcohol  use: Never   Drug use: Never   Sexual activity: Yes  Other Topics Concern   Not on file  Social History Narrative   ** Merged History Encounter **       Social Drivers of Health   Financial Resource Strain: High Risk (04/26/2024)   Overall Financial Resource Strain (CARDIA)    Difficulty of Paying Living Expenses: Hard  Food Insecurity: Food Insecurity Present (04/26/2024)   Hunger Vital Sign    Worried About Running Out of Food in the Last Year: Often true    Ran Out of Food in the Last Year: Sometimes true  Transportation Needs: Unmet Transportation Needs (04/26/2024)   PRAPARE - Transportation    Lack  of Transportation (Medical): No    Lack of Transportation (Non-Medical): Yes  Physical Activity: Insufficiently Active (08/17/2023)   Exercise Vital Sign    Days of Exercise per Week: 3 days    Minutes of Exercise per Session: 30 min  Stress: Stress Concern Present (08/17/2023)   [employer] of Occupational Health - Occupational Stress Questionnaire    Feeling of Stress : To some extent  Social Connections: Unknown (04/26/2024)   Social Connection and Isolation Panel    Frequency of Communication with Friends and Family: Twice a week    Frequency of Social Gatherings  with Friends and Family: Not on file    Attends Religious Services: 1 to 4 times per year    Active Member of Clubs or Organizations: Not on file    Attends Banker Meetings: Not on file    Marital Status: Married   Vitals:   05/09/24 0741  BP: 120/80  Pulse: 65  Resp: 16  SpO2: 98%   Body mass index is 20.51 kg/m.  Physical Exam Vitals and nursing note reviewed.  Constitutional:      General: He is not in acute distress.    Appearance: He is well-developed.  HENT:     Head: Normocephalic and atraumatic.  Eyes:     Conjunctiva/sclera: Conjunctivae normal.  Cardiovascular:     Rate and Rhythm: Normal rate and regular rhythm.     Heart sounds: No murmur heard. Pulmonary:     Effort: Pulmonary effort is normal. No respiratory distress.     Breath sounds: Normal breath sounds.  Musculoskeletal:     Cervical back: Tenderness present. No edema or erythema. Pain with movement present. Decreased range of motion.       Back:  Skin:    General: Skin is warm.     Findings: No erythema or rash.  Neurological:     Mental Status: He is alert and oriented to person, place, and time.     Cranial Nerves: No cranial nerve deficit.     Comments: Antalgic gait, unstable,assisted by a cane. Left patellar DTR 1+, right 2+, reported as baseline.  Psychiatric:        Mood and Affect: Mood and affect  normal.   ASSESSMENT AND PLAN: Mr. Ephriam Turman was seen today for a persistent headache.   Cervical spondylosis with radiculopathy Assessment & Plan: Today he is reporting shoulder pain with some tingling/numbness in LUE. He agrees with prednisone  taper, we discussed son side effects, instructed to avoid taking it with NSAIDs. Currently on gabapentin  400 mg 3 times daily and ibuprofen  800 mg 3 times daily as needed along with Percocet 10-325 mg every 4 hours as needed.  Orders: -     predniSONE ; 3 tabs for 2 days, 2 tabs for 3 days, 1 tabs for 3 days, and 1/2 tab for 3 days. Take tables together with breakfast.  Dispense: 17 tablet; Refill: 0  Headache, unspecified headache type We discussed possible etiologies, history and examination today do not suggest a serious process. He agrees with holding on head imaging for now. ?  Tension headache, we discussed diagnosis, prognosis, and treatment options. Also to consider rebound headache. Monitor for new symptoms. Prednisone  may help. If persistent amitriptyline  could be considered. Clearly instructed about warning signs.  Cerebral palsy, unspecified type [facility]) Assessment & Plan: He has undergone several orthopedic surgeries with residual pain. Needs assistance with transfer, uses a cane. Rest of ADL's  independent. Follows regularly with orthopedist and pain management.  Return if symptoms worsen or fail to improve, for keep next appointment.  I, Vernell Forest, acting as a scribe for Cleone Hulick [place], MD., have documented all relevant documentation on the behalf of Brentyn Seehafer [place], MD, as directed by   while in the presence of Chrisann Melaragno [place], MD.  I, Deandria Klute [place], MD, have reviewed all documentation for this visit. The documentation on 05/09/24 for the exam, diagnosis, procedures, and orders are all accurate and complete.  Jessi Pitstick G. [place], MD  [facility]

## 2024-05-09 NOTE — Assessment & Plan Note (Addendum)
 Today he is reporting shoulder pain with some tingling/numbness in LUE. He agrees with prednisone  taper, we discussed son side effects, instructed to avoid taking it with NSAIDs. Currently on gabapentin  400 mg 3 times daily and ibuprofen  800 mg 3 times daily as needed along with Percocet 10-325 mg every 4 hours as needed.

## 2024-05-09 NOTE — Assessment & Plan Note (Signed)
 He has undergone several orthopedic surgeries with residual pain. Needs assistance with transfer, uses a cane. Rest of ADL's  independent. Follows regularly with orthopedist and pain management.

## 2024-05-09 NOTE — Patient Instructions (Addendum)
 A few things to remember from today's visit:  Cervical spondylosis with radiculopathy - Plan: predniSONE  (DELTASONE ) 20 MG tablet  Cerebral palsy, unspecified type (HCC), Chronic  Headache, unspecified headache type ? Tension headache.  Take Prednisone  with breakfast and not at the same time with Ibuprofen . If headache improves but does not resolve we can consider imaging and/or Amitriptyline .  If you need refills for medications you take chronically, please call your pharmacy. Do not use My Chart to request refills or for acute issues that need immediate attention. If you send a my chart message, it may take a few days to be addressed, specially if I am not in the office.  Please be sure medication list is accurate. If a new problem present, please set up appointment sooner than planned today.

## 2024-05-12 ENCOUNTER — Other Ambulatory Visit: Payer: Self-pay | Admitting: Registered Nurse

## 2024-05-13 DIAGNOSIS — S76012D Strain of muscle, fascia and tendon of left hip, subsequent encounter: Secondary | ICD-10-CM | POA: Diagnosis not present

## 2024-05-14 ENCOUNTER — Emergency Department (HOSPITAL_COMMUNITY)
Admission: EM | Admit: 2024-05-14 | Discharge: 2024-05-14 | Disposition: A | Discharge status: Home / Self Care | Attending: Emergency Medicine | Admitting: Emergency Medicine

## 2024-05-14 ENCOUNTER — Emergency Department (HOSPITAL_COMMUNITY)

## 2024-05-14 ENCOUNTER — Other Ambulatory Visit: Payer: Self-pay

## 2024-05-14 ENCOUNTER — Encounter (HOSPITAL_COMMUNITY): Payer: Self-pay | Admitting: Emergency Medicine

## 2024-05-14 DIAGNOSIS — L0291 Cutaneous abscess, unspecified: Secondary | ICD-10-CM | POA: Diagnosis not present

## 2024-05-14 DIAGNOSIS — L02214 Cutaneous abscess of groin: Secondary | ICD-10-CM | POA: Diagnosis not present

## 2024-05-14 DIAGNOSIS — K5730 Diverticulosis of large intestine without perforation or abscess without bleeding: Secondary | ICD-10-CM | POA: Diagnosis not present

## 2024-05-14 DIAGNOSIS — Z7982 Long term (current) use of aspirin: Secondary | ICD-10-CM | POA: Insufficient documentation

## 2024-05-14 DIAGNOSIS — R1909 Other intra-abdominal and pelvic swelling, mass and lump: Secondary | ICD-10-CM | POA: Diagnosis not present

## 2024-05-14 LAB — CBC WITH DIFFERENTIAL/PLATELET
Abs Immature Granulocytes: 0.03 K/uL (ref 0.00–0.07)
Basophils Absolute: 0.1 K/uL (ref 0.0–0.1)
Basophils Relative: 1 %
Eosinophils Absolute: 0.2 K/uL (ref 0.0–0.5)
Eosinophils Relative: 2 %
HCT: 36.2 % — ABNORMAL LOW (ref 39.0–52.0)
Hemoglobin: 12 g/dL — ABNORMAL LOW (ref 13.0–17.0)
Immature Granulocytes: 0 %
Lymphocytes Relative: 37 %
Lymphs Abs: 3 K/uL (ref 0.7–4.0)
MCH: 27 pg (ref 26.0–34.0)
MCHC: 33.1 g/dL (ref 30.0–36.0)
MCV: 81.5 fL (ref 80.0–100.0)
Monocytes Absolute: 0.6 K/uL (ref 0.1–1.0)
Monocytes Relative: 8 %
Neutro Abs: 4.4 K/uL (ref 1.7–7.7)
Neutrophils Relative %: 52 %
Platelets: 302 K/uL (ref 150–400)
RBC: 4.44 MIL/uL (ref 4.22–5.81)
RDW: 14.1 % (ref 11.5–15.5)
WBC: 8.2 K/uL (ref 4.0–10.5)
nRBC: 0 % (ref 0.0–0.2)

## 2024-05-14 LAB — BASIC METABOLIC PANEL WITH GFR
Anion gap: 12 (ref 5–15)
BUN: 14 mg/dL (ref 8–23)
CO2: 26 mmol/L (ref 22–32)
Calcium: 9.2 mg/dL (ref 8.9–10.3)
Chloride: 99 mmol/L (ref 98–111)
Creatinine, Ser: 0.78 mg/dL (ref 0.61–1.24)
GFR, Estimated: >60 mL/min
Glucose, Bld: 89 mg/dL (ref 70–99)
Potassium: 3.4 mmol/L — ABNORMAL LOW (ref 3.5–5.1)
Sodium: 137 mmol/L (ref 135–145)

## 2024-05-14 MED ORDER — HYDROCODONE-ACETAMINOPHEN 5-325 MG PO TABS
1.0000 | ORAL_TABLET | Freq: Once | ORAL | Status: AC
  Administered 2024-05-14: 1 via ORAL
  Filled 2024-05-14: qty 1

## 2024-05-14 MED ORDER — IOHEXOL 300 MG/ML  SOLN
100.0000 mL | Freq: Once | INTRAMUSCULAR | Status: AC | PRN
  Administered 2024-05-14: 100 mL via INTRAVENOUS

## 2024-05-14 MED ORDER — LIDOCAINE-EPINEPHRINE (PF) 2 %-1:200000 IJ SOLN
20.0000 mL | Freq: Once | INTRAMUSCULAR | Status: AC
  Administered 2024-05-14: 20 mL
  Filled 2024-05-14: qty 20

## 2024-05-14 MED ORDER — DOXYCYCLINE HYCLATE 100 MG PO TABS
100.0000 mg | ORAL_TABLET | Freq: Once | ORAL | Status: AC
  Administered 2024-05-14: 100 mg via ORAL
  Filled 2024-05-14: qty 1

## 2024-05-14 MED ORDER — DOXYCYCLINE HYCLATE 100 MG PO CAPS: 100.0000 mg | ORAL_CAPSULE | Freq: Two times a day (BID) | ORAL | 0 refills | Status: DC

## 2024-05-14 NOTE — ED Provider Notes (Signed)
 [place] EMERGENCY DEPARTMENT AT [facility] Provider Note   CSN: [id number] Arrival date & time: 05/14/24  8095     Patient presents with: Abscess   Philip Bates is a 64 y.o. male.   63 year old male presents today for concern of swelling to the left side of his groin.  He states this has been present for 2 months but worse in the past couple days.  Endorses worsening size, and pain.  No fever or drainage.  Denies any penile discharge or other complaints.  The history is provided by the patient. No language interpreter was used.       Prior to Admission medications   Medication Sig Start Date End Date Taking? Authorizing Provider  aspirin  EC 81 MG tablet Take 1 tablet (81 mg total) by mouth 2 (two) times daily. 09/10/20   Jule Ronal CROME, PA-C  ergocalciferol (VITAMIN D2) 1.25 MG (50000 UT) capsule 1 (ONE) CAPSULE CAPSULE BY MOUTH ONCE A WEEK 12/06/20   [provider]  ferrous sulfate  325 (65 FE) MG tablet Take 1 tablet (325 mg total) by mouth daily with breakfast. Please take with a source of Vitamin C 10/09/23   Dorsey, John T IV, MD  gabapentin  (NEURONTIN ) 400 MG capsule Take 1 capsule (400 mg total) by mouth 3 (three) times daily. 12/02/23   Debby Fidela CROME, NP  ibuprofen  (ADVIL ) 800 MG tablet TAKE 1 TABLET BY MOUTH EVERY 8 HOURS AS NEEDED 04/13/24   Raulkar, Sven SQUIBB, MD  latanoprost (XALATAN) 0.005 % ophthalmic solution SMARTSIG:1 Drop(s) In Eye(s) Every Evening 04/14/23   [provider]  NARCAN 4 MG/0.1ML LIQD nasal spray kit Place 1 spray into the nose as needed (opioid overdose). 11/30/18   [provider]  oxyCODONE -acetaminophen  (PERCOCET) 10-325 MG tablet Take 1 tablet by mouth every 4 (four) hours as needed. 04/27/24   Debby Fidela CROME, NP  predniSONE  (DELTASONE ) 20 MG tablet 3 tabs for 2 days, 2 tabs for 3 days, 1 tabs for 3 days, and 1/2 tab for 3 days. Take tables together with breakfast. 05/09/24 05/17/24  [place], Betty G, MD  sildenafil   (REVATIO ) 20 MG tablet TAKE 2 TO 3 TABLETS BY MOUTH ONCE DAILY AS NEEDED 01/08/24   [place], Betty G, MD  solifenacin  (VESICARE ) 5 MG tablet Take 1 tablet (5 mg total) by mouth daily. 03/29/24   Stoneking, Adine PARAS., MD  tamsulosin  (FLOMAX ) 0.4 MG CAPS capsule Take 1 capsule (0.4 mg total) by mouth daily after supper. 07/06/23   Raulkar, Sven SQUIBB, MD    Allergies: Patient has no known allergies.    Review of Systems  Constitutional:  Negative for chills and fever.  Genitourinary:  Negative for penile discharge, penile pain, penile swelling, scrotal swelling and testicular pain.  All other systems reviewed and are negative.   Updated Vital Signs BP 130/80   Pulse 81   Temp 98.2 F (36.8 C) (Oral)   Resp 17   SpO2 97%   Physical Exam Vitals and nursing note reviewed.  Constitutional:      General: He is not in acute distress.    Appearance: Normal appearance. He is not ill-appearing.  HENT:     Head: Normocephalic and atraumatic.     Nose: Nose normal.  Eyes:     Conjunctiva/sclera: Conjunctivae normal.  Cardiovascular:     Rate and Rhythm: Normal rate and regular rhythm.  Pulmonary:     Effort: Pulmonary effort is normal. No respiratory distress.  Genitourinary:  Comments: 1 and half a raised area on the left side of the groin.  It is tender.  Not fluctuant.  No drainage. Musculoskeletal:        General: No deformity.  Skin:    Findings: No rash.  Neurological:     Mental Status: He is alert.     (all labs ordered are listed, but only abnormal results are displayed) Labs Reviewed  CBC WITH DIFFERENTIAL/PLATELET - Abnormal; Notable for the following components:      Result Value   Hemoglobin 12.0 (*)    HCT 36.2 (*)    All other components within normal limits  BASIC METABOLIC PANEL WITH GFR - Abnormal; Notable for the following components:   Potassium 3.4 (*)    All other components within normal limits    EKG: None  Radiology: No results  found.   .Incision and Drainage  Date/Time: 05/14/2024 11:07 PM  Performed by: Hildegard Loge, PA-C Authorized by: Hildegard Loge, PA-C   Consent:    Consent obtained:  Verbal   Consent given by:  Patient   Risks discussed:  Bleeding, incomplete drainage, pain and damage to other organs   Alternatives discussed:  No treatment Universal protocol:    Procedure explained and questions answered to patient or proxy's satisfaction: yes     Relevant documents present and verified: yes     Test results available : yes     Imaging studies available: yes     Required blood products, implants, devices, and special equipment available: yes     Site/side marked: yes     Immediately prior to procedure, a time out was called: yes     Patient identity confirmed:  Verbally with patient Location:    Type:  Abscess Pre-procedure details:    Skin preparation:  Betadine  Anesthesia:    Anesthesia method:  Local infiltration   Local anesthetic:  Lidocaine  2% WITH epi Procedure type:    Complexity:  Complex Procedure details:    Incision types:  Single straight   Incision depth:  Subcutaneous   Wound management:  Probed and deloculated, irrigated with saline and extensive cleaning   Drainage:  Purulent   Drainage amount:  Moderate   Wound treatment:  Wound left open   Packing materials:  None Post-procedure details:    Procedure completion:  Tolerated well, no immediate complications    Medications Ordered in the ED  iohexol  (OMNIPAQUE ) 300 MG/ML solution 100 mL (has no administration in time range)                                    Medical Decision Making Amount and/or Complexity of Data Reviewed Labs: ordered. Radiology: ordered.  Risk Prescription drug management.   63 year old male presents today for concern of swelling on the left side of his mons pubis.  Ongoing for couple months.  Worse in the past couple days.  No fever or drainage.  Denies any other urinary complaints. Will  obtain basic blood work and CT scan to ensure that it is not anything deep or concerning.  CT with superficial abscess.  No other acute concern.  CBC without leukocytosis.  BMP without acute concern.  I&D performed.  Doxy prescribed. Discussed close follow-up with PCP. Strict return precautions given.  Discharged in stable condition.   Final diagnoses:  Abscess    ED Discharge Orders     None  Hildegard Loge, PA-C 05/14/24 2311    Yolande Lamar BROCKS, MD 05/20/24 1019

## 2024-05-14 NOTE — Discharge Instructions (Signed)
 Abscess was drained.  You were started on antibiotic called doxycycline .  Take your home pain medicine. Return if you notice fever, worsening drainage from the area, significant redness or severe pain. Follow-up with your primary care doctor

## 2024-05-14 NOTE — ED Triage Notes (Signed)
 Pt with swollen nodular area to upper left groin that has grown in size and pain level over the past few days.

## 2024-05-15 LAB — RAPID HIV SCREEN (HIV 1/2 AB+AG)
HIV 1/2 Antibodies: NONREACTIVE
HIV-1 P24 Antigen - HIV24: NONREACTIVE

## 2024-05-15 LAB — HEPATITIS B SURFACE ANTIGEN: Hepatitis B Surface Ag: NONREACTIVE

## 2024-05-17 LAB — HCV INTERPRETATION

## 2024-05-17 LAB — HCV AB W REFLEX TO QUANT PCR: HCV Ab: NONREACTIVE

## 2024-05-18 ENCOUNTER — Ambulatory Visit: Admitting: Urology

## 2024-05-18 ENCOUNTER — Encounter: Payer: Self-pay | Admitting: Urology

## 2024-05-18 VITALS — BP 110/72 | HR 77 | Ht 64.0 in | Wt 120.0 lb

## 2024-05-18 DIAGNOSIS — R3915 Urgency of urination: Secondary | ICD-10-CM

## 2024-05-18 DIAGNOSIS — R351 Nocturia: Secondary | ICD-10-CM

## 2024-05-18 DIAGNOSIS — R350 Frequency of micturition: Secondary | ICD-10-CM

## 2024-05-18 DIAGNOSIS — R399 Unspecified symptoms and signs involving the genitourinary system: Secondary | ICD-10-CM

## 2024-05-18 LAB — MICROSCOPIC EXAMINATION: Bacteria, UA: NONE SEEN

## 2024-05-18 LAB — URINALYSIS, ROUTINE W REFLEX MICROSCOPIC
Bilirubin, UA: NEGATIVE
Glucose, UA: NEGATIVE
Ketones, UA: NEGATIVE
Leukocytes,UA: NEGATIVE
Nitrite, UA: NEGATIVE
Protein,UA: NEGATIVE
Specific Gravity, UA: 1.015 (ref 1.005–1.030)
Urobilinogen, Ur: 0.2 mg/dL (ref 0.2–1.0)
pH, UA: 5.5 (ref 5.0–7.5)

## 2024-05-18 LAB — BLADDER SCAN AMB NON-IMAGING

## 2024-05-18 MED ORDER — SOLIFENACIN SUCCINATE 10 MG PO TABS: 10.0000 mg | ORAL_TABLET | Freq: Every day | ORAL | 11 refills | Status: DC

## 2024-05-18 NOTE — Progress Notes (Signed)
 Assessment: 1. Lower urinary tract symptoms     Plan: Increase solifenacin  to 10 mg daily. Continue tamsulosin . Return to office in 6 weeks.  Chief Complaint:  Chief Complaint  Patient presents with   LUTS    History of Present Illness:  Philip Bates is a 63 y.o. male who is seen for further evaluation of lower urinary tract symptoms. At his visit in June 2025, he reported symptoms for approximately 1 year.  His symptoms included frequency, voiding every 2-3 hours, nocturia x 2, urgency, and occasional urge incontinence.  He was voiding with a good stream and felt like he emptied his bladder completely.  No dysuria or gross hematuria.  No history of UTIs or prostatitis. He had been on tamsulosin  for approximately 1 year without a significant change in his symptoms.  He reported some dizziness associated with the tamsulosin . IPSS = 14/4. PVR = 40 ml.  PSA results: 9/22 1.54 6/24 1.3  He was given a trial of solifenacin  5 mg daily.  He returns today for follow-up.  He continues on solifenacin  5 mg daily.  He is also taking tamsulosin  0.4 mg daily.  He has not seen a significant improvement in his lower urinary tract symptoms.  He continues with urgency, frequency, and nocturia x 3.  He is also having occasional urge incontinence.  No dysuria or gross hematuria.  No side effects from the solifenacin . IPSS = 20/6. He recently had a I&D of a groin abscess done in the emergency room.  He is currently on doxycycline .  Portions of the above documentation were copied from a prior visit for review purposes only.   Past Medical History:  Past Medical History:  Diagnosis Date   Arthritis    Cerebral palsy (HCC)    Cervical radiculopathy    H/O umbilical hernia repair 2001   Pre-diabetes    Scoliosis    Spondylosis of cervical spine     Past Surgical History:  Past Surgical History:  Procedure Laterality Date   ANTERIOR CERVICAL DECOMPRESSION/DISCECTOMY FUSION 4 LEVELS N/A  08/11/2017   Procedure: Cervical three to Cervical seven Anterior Cervical discectomy with fusion/plate fixation;  Surgeon: Ditty, Morene Hicks, MD;  Location: [facility] OR;  Service: Neurosurgery;  Laterality: N/A;   BACK SURGERY     HERNIA REPAIR     inguinal hernia repair in early 2000   LEG SURGERY     POSTERIOR CERVICAL FUSION/FORAMINOTOMY N/A 08/12/2018   Procedure: POSTERIOR CERVICAL FUSION WITH LATERAL MASS FIXATION CERVICAL THREE TO CERVICAL SEVEN;  Surgeon: Lanis Pupa, MD;  Location: [facility] OR;  Service: Neurosurgery;  Laterality: N/A;   SHOULDER ARTHROSCOPY WITH ROTATOR CUFF REPAIR AND SUBACROMIAL DECOMPRESSION Right 02/02/2018   Procedure: RIGHT SHOULDER ARTHROSCOPY WITH SUBACROMIAL DECOMPRESSION, MINI-OPEN ROTATOR CUFF REPAIR;  Surgeon: Addie Cordella Hamilton, MD;  Location: [facility] OR;  Service: Orthopedics;  Laterality: Right;   SHOULDER SURGERY Right    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   Procedure: LEFT TOTAL HIP ARTHROPLASTY ANTERIOR APPROACH;  Surgeon: Jerri Kay HERO, MD;  Location: [facility] OR;  Service: Orthopedics;  Laterality: Left;    Allergies:  No Known Allergies  Family History:  Family History  Problem Relation Age of Onset   Esophageal cancer Father    Cancer Father        esophagus   Diabetes Brother    Diabetes Paternal Uncle    Colon cancer Neg Hx    Rectal cancer Neg Hx    Stomach cancer Neg Hx  Social History:  Social History   Tobacco Use   Smoking status: Every Day    Current packs/day: 1.00    Average packs/day: 1 pack/day for 35.0 years (35.0 ttl pk-yrs)    Types: Cigarettes   Smokeless tobacco: Never  Vaping Use   Vaping status: Never Used  Substance Use Topics   Alcohol  use: Never   Drug use: Never    ROS: Constitutional:  Negative for fever, chills, weight loss CV: Negative for chest pain, previous MI, hypertension Respiratory:  Negative for shortness of breath, wheezing, sleep apnea, frequent cough GI:  Negative for nausea, vomiting,  bloody stool, GERD  Physical exam: BP 110/72   Pulse 77   Ht 5' 4 (1.626 m)   Wt 120 lb (54.4 kg)   BMI 20.60 kg/m  GENERAL APPEARANCE:  Well appearing, well developed, well nourished, NAD HEENT:  Atraumatic, normocephalic, oropharynx clear NECK:  Supple without lymphadenopathy or thyromegaly ABDOMEN:  Soft, non-tender, no masses; left groin without fluctuance, incision healing EXTREMITIES:  Moves all extremities well, without clubbing, cyanosis, or edema NEUROLOGIC:  Alert and oriented x 3,, CN II-XII grossly intact MENTAL STATUS:  appropriate BACK:  Non-tender to palpation, No CVAT SKIN:  Warm, dry, and intact   Results: U/A: 0-5 WBCs, 0-2 RBCs  PVR = 0 mL

## 2024-05-19 DIAGNOSIS — S76012D Strain of muscle, fascia and tendon of left hip, subsequent encounter: Secondary | ICD-10-CM | POA: Diagnosis not present

## 2024-05-19 DIAGNOSIS — M7062 Trochanteric bursitis, left hip: Secondary | ICD-10-CM | POA: Diagnosis not present

## 2024-05-21 ENCOUNTER — Ambulatory Visit (HOSPITAL_BASED_OUTPATIENT_CLINIC_OR_DEPARTMENT_OTHER)
Admission: RE | Admit: 2024-05-21 | Discharge: 2024-05-21 | Disposition: A | Discharge status: Home / Self Care | Source: Ambulatory Visit | Attending: Family Medicine | Admitting: Family Medicine

## 2024-05-21 DIAGNOSIS — Z122 Encounter for screening for malignant neoplasm of respiratory organs: Secondary | ICD-10-CM | POA: Insufficient documentation

## 2024-05-21 DIAGNOSIS — Z87891 Personal history of nicotine dependence: Secondary | ICD-10-CM | POA: Insufficient documentation

## 2024-05-25 ENCOUNTER — Ambulatory Visit: Admitting: Family Medicine

## 2024-05-31 ENCOUNTER — Ambulatory Visit: Admitting: Family Medicine

## 2024-06-07 DIAGNOSIS — S76012D Strain of muscle, fascia and tendon of left hip, subsequent encounter: Secondary | ICD-10-CM | POA: Diagnosis not present

## 2024-06-08 ENCOUNTER — Other Ambulatory Visit: Payer: Self-pay | Admitting: Registered Nurse

## 2024-06-08 MED ORDER — GABAPENTIN 400 MG PO CAPS: 400.0000 mg | ORAL_CAPSULE | Freq: Three times a day (TID) | ORAL | 3 refills | Status: DC

## 2024-06-14 ENCOUNTER — Encounter: Attending: Physical Medicine and Rehabilitation | Admitting: Physical Medicine and Rehabilitation

## 2024-06-14 ENCOUNTER — Ambulatory Visit: Payer: Self-pay | Admitting: Family Medicine

## 2024-06-14 DIAGNOSIS — G894 Chronic pain syndrome: Secondary | ICD-10-CM | POA: Insufficient documentation

## 2024-06-14 DIAGNOSIS — M21372 Foot drop, left foot: Secondary | ICD-10-CM | POA: Diagnosis not present

## 2024-06-14 DIAGNOSIS — M961 Postlaminectomy syndrome, not elsewhere classified: Secondary | ICD-10-CM | POA: Insufficient documentation

## 2024-06-14 DIAGNOSIS — G4700 Insomnia, unspecified: Secondary | ICD-10-CM | POA: Diagnosis not present

## 2024-06-14 DIAGNOSIS — G8929 Other chronic pain: Secondary | ICD-10-CM | POA: Insufficient documentation

## 2024-06-14 DIAGNOSIS — K219 Gastro-esophageal reflux disease without esophagitis: Secondary | ICD-10-CM

## 2024-06-14 DIAGNOSIS — M25511 Pain in right shoulder: Secondary | ICD-10-CM | POA: Diagnosis not present

## 2024-06-14 DIAGNOSIS — S73199S Other sprain of unspecified hip, sequela: Secondary | ICD-10-CM | POA: Insufficient documentation

## 2024-06-14 DIAGNOSIS — M5412 Radiculopathy, cervical region: Secondary | ICD-10-CM | POA: Diagnosis not present

## 2024-06-14 DIAGNOSIS — M545 Low back pain, unspecified: Secondary | ICD-10-CM | POA: Insufficient documentation

## 2024-06-14 DIAGNOSIS — M542 Cervicalgia: Secondary | ICD-10-CM

## 2024-06-14 DIAGNOSIS — M25562 Pain in left knee: Secondary | ICD-10-CM | POA: Diagnosis not present

## 2024-06-14 DIAGNOSIS — M546 Pain in thoracic spine: Secondary | ICD-10-CM | POA: Diagnosis not present

## 2024-06-14 DIAGNOSIS — M25552 Pain in left hip: Secondary | ICD-10-CM | POA: Insufficient documentation

## 2024-06-14 DIAGNOSIS — M25512 Pain in left shoulder: Secondary | ICD-10-CM | POA: Insufficient documentation

## 2024-06-14 DIAGNOSIS — M24852 Other specific joint derangements of left hip, not elsewhere classified: Secondary | ICD-10-CM | POA: Diagnosis not present

## 2024-06-14 MED ORDER — OMEPRAZOLE 40 MG PO CPDR: 40.0000 mg | DELAYED_RELEASE_CAPSULE | Freq: Every day | ORAL | 0 refills | Status: DC

## 2024-06-14 MED ORDER — XTAMPZA ER 9 MG PO C12A: 1.0000 | EXTENDED_RELEASE_CAPSULE | Freq: Two times a day (BID) | ORAL | 0 refills | Status: DC

## 2024-06-14 MED ORDER — OXYCODONE-ACETAMINOPHEN 10-325 MG PO TABS: 1.0000 | ORAL_TABLET | Freq: Four times a day (QID) | ORAL | 0 refills | Status: DC | PRN

## 2024-06-14 NOTE — Progress Notes (Signed)
 Subjective:    Patient ID: Philip Bates, male    DOB: March 12, 1961, 64 y.o.   MRN: [id number]  HPI:  An audio/video tele-health visit is felt to be the most appropriate encounter for this patient at this time. This is a follow up tele-visit via phone. The patient is at home. MD is at office. Prior to scheduling this appointment, our staff discussed the limitations of evaluation and management by telemedicine and the availability of in-person appointments. The patient expressed understanding and agreed to proceed.   Philip Bates is a 63 y.o. male who returns for follow up appointment for chronic pain and medication refill. He states his pain is located in his neck radiating into her bilateral shoulders, lower back pain, left hip and left knee pain. He rates his pain 8. His current exercise regime is walking.   Philip Bates  equivalent is 60.00 MME.   Last UDS was Performed on 04/27/2023, it was consistent  Philip Bates past surgical history:  On 02/02/2018: Dr Addie   RIGHT SHOULDER ARTHROSCOPY WITH SUBACROMIAL DECOMPRESSION, MINI-OPEN ROTATOR CUFF REPAIR  On 08/12/2028: Dr Lanis POSTERIOR CERVICAL FUSION WITH LATERAL MASS FIXATION CERVICAL THREE TO CERVICAL SEVEN  On 09/10/2020: Dr Jerri LEFT TOTAL HIP ARTHROPLASTY ANTERIOR APPROACH   1) Insomnia He does not like the way the trazodone  makes him feel -tart cherry juice he is drinking   2) Hip pain: -sometimes he needs more medication than prescribed -painting worsens pain -has been diagnosed with a labral tear, is planning to get surgery -needs refill of his oxycodone   Pain Inventory Average Pain 8 Pain Right Now 8 My pain is constant, sharp, burning, dull, stabbing, tingling, and aching  In the last 24 hours, has pain interfered with the following? General activity 8 Relation with others 7 Enjoyment of life 7 What TIME of day is your pain at its worst? morning  Sleep (in general) Poor  Pain is worse with: walking, standing,  and some activites Pain improves with: rest and medication Relief from Meds: 7  Family History  Problem Relation Age of Onset   Esophageal cancer Father    Cancer Father        esophagus   Diabetes Brother    Diabetes Paternal Uncle    Colon cancer Neg Hx    Rectal cancer Neg Hx    Stomach cancer Neg Hx    Social History   Socioeconomic History   Marital status: Married    Spouse name: Not on file   Number of children: Not on file   Years of education: Not on file   Highest education level: Some college, no degree  Occupational History   Not on file  Tobacco Use   Smoking status: Every Day    Current packs/day: 1.00    Average packs/day: 1 pack/day for 35.0 years (35.0 ttl pk-yrs)    Types: Cigarettes   Smokeless tobacco: Never  Vaping Use   Vaping status: Never Used  Substance and Sexual Activity   Alcohol  use: Never   Drug use: Never   Sexual activity: Yes  Other Topics Concern   Not on file  Social History Narrative   ** Merged History Encounter **       Social Drivers of Health   Financial Resource Strain: Low Risk  (06/07/2024)   Received from [facility]   Overall Financial Resource Strain (CARDIA)    How hard is it for you to pay for the very basics like food, housing, medical  care, and heating?: Not very hard  Recent Concern: Financial Resource Strain - High Risk (04/26/2024)   Overall Financial Resource Strain (CARDIA)    Difficulty of Paying Living Expenses: Hard  Food Insecurity: Food Insecurity Present (06/07/2024)   Received from [facility]   Hunger Vital Sign    Within the past 12 months, you worried that your food would run out before you got the money to buy more.: Sometimes true    Within the past 12 months, the food you bought just didn't last and you didn't have money to get more.: Sometimes true  Transportation Needs: Unmet Transportation Needs (06/07/2024)   Received from [facility] - Transportation    In the past 12 months,  has lack of transportation kept you from medical appointments or from getting medications?: Yes    In the past 12 months, has lack of transportation kept you from meetings, work, or from getting things needed for daily living?: Yes  Physical Activity: Insufficiently Active (06/07/2024)   Received from [facility]   Exercise Vital Sign    On average, how many days per week do you engage in moderate to strenuous exercise (like a brisk walk)?: 2 days    On average, how many minutes do you engage in exercise at this level?: 30 min  Stress: No Stress Concern Present (06/07/2024)   Received from [facility] - Occupational Stress Questionnaire    Do you feel stress - tense, restless, nervous, or anxious, or unable to sleep at night because your mind is troubled all the time - these days?: Only a little  Social Connections: Somewhat Isolated (06/07/2024)   Received from [facility]   Social Network    How would you rate your social network (family, work, friends)?: Restricted participation with some degree of social isolation   Past Surgical History:  Procedure Laterality Date   ANTERIOR CERVICAL DECOMPRESSION/DISCECTOMY FUSION 4 LEVELS N/A 08/11/2017   Procedure: Cervical three to Cervical seven Anterior Cervical discectomy with fusion/plate fixation;  Surgeon: Ditty, Morene Hicks, MD;  Location: [facility] OR;  Service: Neurosurgery;  Laterality: N/A;   BACK SURGERY     HERNIA REPAIR     inguinal hernia repair in early 2000   LEG SURGERY     POSTERIOR CERVICAL FUSION/FORAMINOTOMY N/A 08/12/2018   Procedure: POSTERIOR CERVICAL FUSION WITH LATERAL MASS FIXATION CERVICAL THREE TO CERVICAL SEVEN;  Surgeon: Lanis Pupa, MD;  Location: [facility] OR;  Service: Neurosurgery;  Laterality: N/A;   SHOULDER ARTHROSCOPY WITH ROTATOR CUFF REPAIR AND SUBACROMIAL DECOMPRESSION Right 02/02/2018   Procedure: RIGHT SHOULDER ARTHROSCOPY WITH SUBACROMIAL DECOMPRESSION, MINI-OPEN  ROTATOR CUFF REPAIR;  Surgeon: Addie Cordella Hamilton, MD;  Location: [facility] OR;  Service: Orthopedics;  Laterality: Right;   SHOULDER SURGERY Right    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   Procedure: LEFT TOTAL HIP ARTHROPLASTY ANTERIOR APPROACH;  Surgeon: Jerri Kay HERO, MD;  Location: [facility] OR;  Service: Orthopedics;  Laterality: Left;   Past Surgical History:  Procedure Laterality Date   ANTERIOR CERVICAL DECOMPRESSION/DISCECTOMY FUSION 4 LEVELS N/A 08/11/2017   Procedure: Cervical three to Cervical seven Anterior Cervical discectomy with fusion/plate fixation;  Surgeon: Ditty, Morene Hicks, MD;  Location: [facility] OR;  Service: Neurosurgery;  Laterality: N/A;   BACK SURGERY     HERNIA REPAIR     inguinal hernia repair in early 2000   LEG SURGERY     POSTERIOR CERVICAL FUSION/FORAMINOTOMY N/A 08/12/2018   Procedure: POSTERIOR  CERVICAL FUSION WITH LATERAL MASS FIXATION CERVICAL THREE TO CERVICAL SEVEN;  Surgeon: Lanis Pupa, MD;  Location: [facility] OR;  Service: Neurosurgery;  Laterality: N/A;   SHOULDER ARTHROSCOPY WITH ROTATOR CUFF REPAIR AND SUBACROMIAL DECOMPRESSION Right 02/02/2018   Procedure: RIGHT SHOULDER ARTHROSCOPY WITH SUBACROMIAL DECOMPRESSION, MINI-OPEN ROTATOR CUFF REPAIR;  Surgeon: Addie Cordella Hamilton, MD;  Location[facility] OR;  Service: Orthopedics;  Laterality: Right;   SHOULDER SURGERY Right    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   Procedure: LEFT TOTAL HIP ARTHROPLASTY ANTERIOR APPROACH;  Surgeon: Jerri Kay HERO, MD;  Location: [facility] OR;  Service: Orthopedics;  Laterality: Left;   Past Medical History:  Diagnosis Date   Arthritis    Cerebral palsy (HCC)    Cervical radiculopathy    H/O umbilical hernia repair 2001   Pre-diabetes    Scoliosis    Spondylosis of cervical spine    There were no vitals taken for this visit.  Opioid Risk Score:   Fall Risk Score:  `1  Depression screen [facility] 2/9     04/27/2024   10:24 AM 03/29/2024    1:21 PM 03/03/2024    9:52 AM 12/02/2023    1:45  PM 11/05/2023   10:58 AM 09/07/2023    1:30 PM 07/06/2023    2:45 PM  Depression screen PHQ 2/9  Decreased Interest 0  1 0 0 1 1  Down, Depressed, Hopeless 0 0 1 0 0 1 1  PHQ - 2 Score 0 0 2 0 0 2 2  Altered sleeping 0 0 1      Tired, decreased energy 0 0 1      Change in appetite 0 0 0      Feeling bad or failure about yourself  0 0 0      Trouble concentrating 0 0 0      Moving slowly or fidgety/restless 0 0 0      Suicidal thoughts 0 0 0      PHQ-9 Score 0 0 4      Difficult doing work/chores  Somewhat difficult Somewhat difficult        Review of Systems  Musculoskeletal:  Positive for back pain.       B/L shoulder LT leg LT hip pain  All other systems reviewed and are negative.      Objective:       Assessment & Plan:  Cervicalgia/ Cervical Radiculitis: Continue HEP as Tolerated. Continue to Monitor. Continue Gabapentin . Continue to Monitor.  Chronic Right Shoulder Pain:  Continue HEP as Tolerated. Continue to Monitor. Ortho Following.  Failed Back Syndrome: Continue HEP as Tolerated. Continue to Monitor.  Chronic Bilateral Low Back Pain without Sciatica: Continue HEP as Tolerated. Continue current medication regimen. Continue to Monitor.  Chronic Left Hip Pain: S/P On 09/10/2020: Dr Jerri LEFT TOTAL HIP ARTHROPLASTY ANTERIOR APPROACH Continue HEP as Tolerated. Continue current medication regimen. Continue to Monitor.  Discussed that new labral tear was found and he is planning for surgical fixation -discussed his labral tear, discussed that we can try long acting oxycodone  Xtampza  since his pain is not adequately controlled with his immediate release oxycodone  Chronic Left knee Pain: Continue HEP as Tolerated. Continue to Monitor.  Left Foot Drop: Wearing AFO: [employer] Following. Continue to Monitor.   Chronic Pain Syndrome: Increase Oxycodone  10/325 mg one tablet 6 times a day as needed for pain #180. We will continue the opioid monitoring program, this consists of regular  clinic visits, examinations, urine drug screen, pill  counts as well as use of [place]  Controlled Substance Reporting system. A 12 month History has been reviewed on the [place]  Controlled Substance Reporting System on 05/26/2023  -CT left hip ordered  Insomnia: increase gabapentin  to 400mg  HS.  Recommended tart cherry juice, valerian and chamomile teas, continue tart cherry juice Recommended that he minimize light and screens at night Recommended that he shoulder get sunlight exposure during the day  BPH:  Prescribed flomax   5 minutes spent in discussion of his labral tear, discussed that we can try long acting oxycodone  Xtampza  since his pain is not adequately controlled with his immediate release oxycodone 

## 2024-06-15 ENCOUNTER — Ambulatory Visit (INDEPENDENT_AMBULATORY_CARE_PROVIDER_SITE_OTHER): Payer: 59

## 2024-06-15 VITALS — Ht 64.0 in | Wt 120.0 lb

## 2024-06-15 DIAGNOSIS — Z0000 Encounter for general adult medical examination without abnormal findings: Secondary | ICD-10-CM

## 2024-06-15 NOTE — Patient Instructions (Addendum)
 Philip Bates,  Thank you for taking the time for your Medicare Wellness Visit. I appreciate your continued commitment to your health goals. Please review the care plan we discussed, and feel free to reach out if I can assist you further.  Medicare recommends these wellness visits once per year to help you and your care team stay ahead of potential health issues. These visits are designed to focus on prevention, allowing your provider to concentrate on managing your acute and chronic conditions during your regular appointments.  Please note that Annual Wellness Visits do not include a physical exam. Some assessments may be limited, especially if the visit was conducted virtually. If needed, we may recommend a separate in-person follow-up with your provider.  Ongoing Care Seeing your primary care provider every 3 to 6 months helps us  monitor your health and provide consistent, personalized care.   Referrals If a referral was made during today's visit and you haven't received any updates within two weeks, please contact the referred provider directly to check on the status.  Recommended Screenings:  Health Maintenance  Topic Date Due   Flu Shot  05/06/2024   COVID-19 Vaccine (6 - 2025-26 season) 06/06/2024   Screening for Lung Cancer  05/21/2025   Medicare Annual Wellness Visit  06/15/2025   DTaP/Tdap/Td vaccine (2 - Td or Tdap) 08/14/2030   Colon Cancer Screening  11/03/2033   Pneumococcal Vaccine for age over 22  Completed   Hepatitis C Screening  Completed   HIV Screening  Completed   Hepatitis B Vaccine  Aged Out   HPV Vaccine  Aged Out   Meningitis B Vaccine  Aged Out   Zoster (Shingles) Vaccine  Discontinued  Opioid Pain Medicine Management Opioids are powerful medicines that are used to treat moderate to severe pain. When used for short periods of time, they can help you to: Sleep better. Do better in physical or occupational therapy. Feel better in the first few days after an  injury. Recover from surgery. Opioids should be taken with the supervision of a trained health care provider. They should be taken for the shortest period of time possible. This is because opioids can be addictive, and the longer you take opioids, the greater your risk of addiction. This addiction can also be called opioid use disorder. What are the risks? Using opioid pain medicines for longer than 3 days increases your risk of side effects. Side effects include: Constipation. Nausea and vomiting. Breathing difficulties (respiratory depression). Drowsiness. Confusion. Opioid use disorder. Itching. Taking opioid pain medicine for a long period of time can affect your ability to do daily tasks. It also puts you at risk for: Motor vehicle crashes. Depression. Suicide. Heart attack. Overdose, which can be life-threatening. What is a pain treatment plan? A pain treatment plan is an agreement between you and your health care provider. Pain is unique to each person, and treatments vary depending on your condition. To manage your pain, you and your health care provider need to work together. To help you do this: Discuss the goals of your treatment, including how much pain you might expect to have and how you will manage the pain. Review the risks and benefits of taking opioid medicines. Remember that a good treatment plan uses more than one approach and minimizes the chance of side effects. Be honest about the amount of medicines you take and about any drug or alcohol  use. Get pain medicine prescriptions from only one health care provider. Pain can be managed with  many types of alternative treatments. Ask your health care provider to refer you to one or more specialists who can help you manage pain through: Physical or occupational therapy. Counseling (cognitive behavioral therapy). Good nutrition. Biofeedback. Massage. Meditation. Non-opioid medicine. Following a gentle exercise  program. How to use opioid pain medicine Taking medicine Take your pain medicine exactly as told by your health care provider. Take it only when you need it. If your pain gets less severe, you may take less than your prescribed dose if your health care provider approves. If you are not having pain, do nottake pain medicine unless your health care provider tells you to take it. If your pain is severe, do nottry to treat it yourself by taking more pills than instructed on your prescription. Contact your health care provider for help. Write down the times when you take your pain medicine. It is easy to become confused while on pain medicine. Writing the time can help you avoid overdose. Take other over-the-counter or prescription medicines only as told by your health care provider. Keeping yourself and others safe  While you are taking opioid pain medicine: Do not drive, use machinery, or power tools. Do not sign legal documents. Do not drink alcohol . Do not take sleeping pills. Do not supervise children by yourself. Do not do activities that require climbing or being in high places. Do not go to a lake, river, ocean, spa, or swimming pool. Do not share your pain medicine with anyone. Keep pain medicine in a locked cabinet or in a secure area where pets and children cannot reach it. Stopping your use of opioids If you have been taking opioid medicine for more than a few weeks, you may need to slowly decrease (taper) how much you take until you stop completely. Tapering your use of opioids can decrease your risk of symptoms of withdrawal, such as: Pain and cramping in the abdomen. Nausea. Sweating. Sleepiness. Restlessness. Uncontrollable shaking (tremors). Cravings for the medicine. Do not attempt to taper your use of opioids on your own. Talk with your health care provider about how to do this. Your health care provider may prescribe a step-down schedule based on how much medicine you are  taking and how long you have been taking it. Getting rid of leftover pills Do not save any leftover pills. Get rid of leftover pills safely by: Taking the medicine to a prescription take-back program. This is usually offered by the county or law enforcement. Bringing them to a pharmacy that has a drug disposal container. Flushing them down the toilet. Check the label or package insert of your medicine to see whether this is safe to do. Throwing them out in the trash. Check the label or package insert of your medicine to see whether this is safe to do. If it is safe to throw it out, remove the medicine from the original container, put it into a sealable bag or container, and mix it with used coffee grounds, food scraps, dirt, or cat litter before putting it in the trash. Follow these instructions at home: Activity Do exercises as told by your health care provider. Avoid activities that make your pain worse. Return to your normal activities as told by your health care provider. Ask your health care provider what activities are safe for you. General instructions You may need to take these actions to prevent or treat constipation: Drink enough fluid to keep your urine pale yellow. Take over-the-counter or prescription medicines. Eat foods that are high  in fiber, such as beans, whole grains, and fresh fruits and vegetables. Limit foods that are high in fat and processed sugars, such as fried or sweet foods. Keep all follow-up visits. This is important. Where to find support If you have been taking opioids for a long time, you may benefit from receiving support for quitting from a local support group or counselor. Ask your health care provider for a referral to these resources in your area. Where to find more information Centers for Disease Control and Prevention (CDC): FootballExhibition.com.br [place] Food and Drug Administration (FDA): PumpkinSearch.com.ee Get help right away if: You may have taken too much of an  opioid (overdosed). Common symptoms of an overdose: Your breathing is slower or more shallow than normal. You have a very slow heartbeat (pulse). You have slurred speech. You have nausea and vomiting. Your pupils become very small. You have other potential symptoms: You are very confused. You faint or feel like you will faint. You have cold, clammy skin. You have blue lips or fingernails. You have thoughts of harming yourself or harming others. These symptoms may represent a serious problem that is an emergency. Do not wait to see if the symptoms will go away. Get medical help right away. Call your local emergency services (911 in the [place] Do not drive yourself to the hospital.  If you ever feel like you may hurt yourself or others, or have thoughts about taking your own life, get help right away. Go to your nearest emergency department or: Call your local emergency services (911 in the [place] Call the [facility] ((825)427-2283 in the [place] Call a suicide crisis helpline, such as the National Suicide Prevention Lifeline at 517-161-0732 or 988 in the [place] This is open 24 hours a day in the [place] If you're a Veteran: Call 988 and press 1. This is open 24 hours a day. Text the [employer] at 289-210-3608. Summary Opioid medicines can help you manage moderate to severe pain for a short period of time. A pain treatment plan is an agreement between you and your health care provider. Discuss the goals of your treatment, including how much pain you might expect to have and how you will manage the pain. If you think that you or someone else may have taken too much of an opioid, get medical help right away. This information is not intended to replace advice given to you by your health care provider. Make sure you discuss any questions you have with your health care provider. Document Revised: 06/29/2023 Document Reviewed: 01/02/2021 Elsevier Patient Education  2024  Elsevier Inc.     06/15/2024   10:44 AM  Advanced Directives  Does Patient Have a Medical Advance Directive? No  Would patient like information on creating a medical advance directive? No - Patient declined   Advance Care Planning is important because it: Ensures you receive medical care that aligns with your values, goals, and preferences. Provides guidance to your family and loved ones, reducing the emotional burden of decision-making during critical moments.  Vision: Annual vision screenings are recommended for early detection of glaucoma, cataracts, and diabetic retinopathy. These exams can also reveal signs of chronic conditions such as diabetes and high blood pressure.  Dental: Annual dental screenings help detect early signs of oral cancer, gum disease, and other conditions linked to overall health, including heart disease and diabetes.  Please see the attached documents for additional preventive care recommendations.

## 2024-06-15 NOTE — Progress Notes (Signed)
 Subjective:   Philip Bates is a 63 y.o. who presents for a Medicare Wellness preventive visit.  As a reminder, Annual Wellness Visits don't include a physical exam, and some assessments may be limited, especially if this visit is performed virtually. We may recommend an in-person follow-up visit with your provider if needed.  Visit Complete: Virtual I connected with  Fairy Moats on 06/15/24 by a audio enabled telemedicine application and verified that I am speaking with the correct person using two identifiers.  Patient Location: Home  Provider Location: Home Office  I discussed the limitations of evaluation and management by telemedicine. The patient expressed understanding and agreed to proceed.  Vital Signs: Because this visit was a virtual/telehealth visit, some criteria may be missing or patient reported. Any vitals not documented were not able to be obtained and vitals that have been documented are patient reported.    Persons Participating in Visit: Patient.  AWV Questionnaire: Yes: Patient Medicare AWV questionnaire was completed by the patient on 06/14/24; I have confirmed that all information answered by patient is correct and no changes since this date.  Cardiac Risk Factors include: advanced age (>15men, >55 women);male gender;smoking/ tobacco exposure     Objective:    Today's Vitals   06/15/24 1035  Weight: 120 lb (54.4 kg)  Height: 5' 4 (1.626 m)   Body mass index is 20.6 kg/m.     06/15/2024   10:44 AM 05/14/2024    7:17 PM 06/12/2023   10:31 AM 12/28/2022    1:58 PM 04/16/2022    2:32 PM 04/09/2021    3:28 PM 03/01/2021   11:39 AM  Advanced Directives  Does Patient Have a Medical Advance Directive? No No No No No Yes No  Would patient like information on creating a medical advance directive? No - Patient declined  No - Patient declined No - Patient declined No - Patient declined Yes (MAU/Ambulatory/Procedural Areas - Information given) No - Patient declined     Current Medications (verified) Outpatient Encounter Medications as of 06/15/2024  Medication Sig   aspirin  EC 81 MG tablet Take 1 tablet (81 mg total) by mouth 2 (two) times daily.   doxycycline  (VIBRAMYCIN ) 100 MG capsule Take 1 capsule (100 mg total) by mouth 2 (two) times daily. (Patient not taking: Reported on 06/15/2024)   ergocalciferol (VITAMIN D2) 1.25 MG (50000 UT) capsule 1 (ONE) CAPSULE CAPSULE BY MOUTH ONCE A WEEK   ferrous sulfate  325 (65 FE) MG tablet Take 1 tablet (325 mg total) by mouth daily with breakfast. Please take with a source of Vitamin C   gabapentin  (NEURONTIN ) 400 MG capsule Take 1 capsule (400 mg total) by mouth 3 (three) times daily.   ibuprofen  (ADVIL ) 800 MG tablet TAKE 1 TABLET BY MOUTH EVERY 8 HOURS AS NEEDED   latanoprost (XALATAN) 0.005 % ophthalmic solution SMARTSIG:1 Drop(s) In Eye(s) Every Evening   NARCAN 4 MG/0.1ML LIQD nasal spray kit Place 1 spray into the nose as needed (opioid overdose).   omeprazole  (PRILOSEC) 40 MG capsule Take 1 capsule (40 mg total) by mouth daily before breakfast.   oxyCODONE  ER (XTAMPZA  ER) 9 MG C12A Take 1 capsule by mouth every 12 (twelve) hours.   oxyCODONE -acetaminophen  (PERCOCET) 10-325 MG tablet Take 1 tablet by mouth every 6 (six) hours as needed for pain.   sildenafil  (REVATIO ) 20 MG tablet TAKE 2 TO 3 TABLETS BY MOUTH ONCE DAILY AS NEEDED   solifenacin  (VESICARE ) 10 MG tablet Take 1 tablet (10 mg total)  by mouth daily.   tamsulosin  (FLOMAX ) 0.4 MG CAPS capsule Take 1 capsule (0.4 mg total) by mouth daily after supper. (Patient not taking: Reported on 06/15/2024)   No facility-administered encounter medications on file as of 06/15/2024.    Allergies (verified) Patient has no known allergies.   History: Past Medical History:  Diagnosis Date   Arthritis    Cerebral palsy (HCC)    Cervical radiculopathy    H/O umbilical hernia repair 2001   Pre-diabetes    Scoliosis    Spondylosis of cervical spine    Past  Surgical History:  Procedure Laterality Date   ANTERIOR CERVICAL DECOMPRESSION/DISCECTOMY FUSION 4 LEVELS N/A 08/11/2017   Procedure: Cervical three to Cervical seven Anterior Cervical discectomy with fusion/plate fixation;  Surgeon: Ditty, Morene Hicks, MD;  Location: [facility] OR;  Service: Neurosurgery;  Laterality: N/A;   BACK SURGERY     HERNIA REPAIR     inguinal hernia repair in early 2000   LEG SURGERY     POSTERIOR CERVICAL FUSION/FORAMINOTOMY N/A 08/12/2018   Procedure: POSTERIOR CERVICAL FUSION WITH LATERAL MASS FIXATION CERVICAL THREE TO CERVICAL SEVEN;  Surgeon: Lanis Pupa, MD;  Location: [facility] OR;  Service: Neurosurgery;  Laterality: N/A;   SHOULDER ARTHROSCOPY WITH ROTATOR CUFF REPAIR AND SUBACROMIAL DECOMPRESSION Right 02/02/2018   Procedure: RIGHT SHOULDER ARTHROSCOPY WITH SUBACROMIAL DECOMPRESSION, MINI-OPEN ROTATOR CUFF REPAIR;  Surgeon: Addie Cordella Hamilton, MD;  Location: [facility] OR;  Service: Orthopedics;  Laterality: Right;   SHOULDER SURGERY Right    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   Procedure: LEFT TOTAL HIP ARTHROPLASTY ANTERIOR APPROACH;  Surgeon: Jerri Kay HERO, MD;  Location: [facility] OR;  Service: Orthopedics;  Laterality: Left;   Family History  Problem Relation Age of Onset   Esophageal cancer Father    Cancer Father        esophagus   Diabetes Brother    Diabetes Paternal Uncle    Colon cancer Neg Hx    Rectal cancer Neg Hx    Stomach cancer Neg Hx    Social History   Socioeconomic History   Marital status: Married    Spouse name: Not on file   Number of children: Not on file   Years of education: Not on file   Highest education level: Some college, no degree  Occupational History   Not on file  Tobacco Use   Smoking status: Every Day    Current packs/day: 1.00    Average packs/day: 1 pack/day for 35.0 years (35.0 ttl pk-yrs)    Types: Cigarettes   Smokeless tobacco: Never  Vaping Use   Vaping status: Never Used  Substance and Sexual Activity    Alcohol  use: Never   Drug use: Never   Sexual activity: Yes  Other Topics Concern   Not on file  Social History Narrative   ** Merged History Encounter **       Social Drivers of Health   Financial Resource Strain: Low Risk  (06/15/2024)   Overall Financial Resource Strain (CARDIA)    Difficulty of Paying Living Expenses: Not hard at all  Recent Concern: Financial Resource Strain - High Risk (04/26/2024)   Overall Financial Resource Strain (CARDIA)    Difficulty of Paying Living Expenses: Hard  Food Insecurity: No Food Insecurity (06/15/2024)   Hunger Vital Sign    Worried About Running Out of Food in the Last Year: Never true    Ran Out of Food in the Last Year: Never true  Recent Concern: Food Insecurity -  Food Insecurity Present (06/07/2024)   Received from [facility]   Hunger Vital Sign    Within the past 12 months, you worried that your food would run out before you got the money to buy more.: Sometimes true    Within the past 12 months, the food you bought just didn't last and you didn't have money to get more.: Sometimes true  Transportation Needs: No Transportation Needs (06/15/2024)   PRAPARE - Administrator, Civil Service (Medical): No    Lack of Transportation (Non-Medical): No  Recent Concern: Transportation Needs - Unmet Transportation Needs (06/07/2024)   Received from [facility] - Transportation    In the past 12 months, has lack of transportation kept you from medical appointments or from getting medications?: Yes    In the past 12 months, has lack of transportation kept you from meetings, work, or from getting things needed for daily living?: Yes  Physical Activity: Inactive (06/15/2024)   Exercise Vital Sign    Days of Exercise per Week: 0 days    Minutes of Exercise per Session: 0 min  Stress: No Stress Concern Present (06/15/2024)   [employer] of Occupational Health - Occupational Stress Questionnaire    Feeling of Stress: Not at  all  Social Connections: Moderately Isolated (06/15/2024)   Social Connection and Isolation Panel    Frequency of Communication with Friends and Family: More than three times a week    Frequency of Social Gatherings with Friends and Family: More than three times a week    Attends Religious Services: Never    Database administrator or Organizations: No    Attends Engineer, structural: Never    Marital Status: Married    Tobacco Counseling Ready to quit: Yes Counseling given: Yes    Clinical Intake:  Pre-visit preparation completed: Yes  Pain : No/denies pain     Nutritional Risks: None Diabetes: No  Lab Results  Component Value Date   HGBA1C 5.6 03/04/2023   HGBA1C 6.0 04/30/2022   HGBA1C 6.1 04/30/2021     How often do you need to have someone help you when you read instructions, pamphlets, or other written materials from your doctor or pharmacy?: 1 - Never  Interpreter Needed?: No  Information entered by :: Rojelio Blush LPN   Activities of Daily Living     06/15/2024   10:43 AM 06/14/2024   10:31 AM  In your present state of health, do you have any difficulty performing the following activities:  Hearing? 0 0  Vision? 0 0  Difficulty concentrating or making decisions? 0 0  Walking or climbing stairs? 1   Comment Uses a Cane   Dressing or bathing? 0   Doing errands, shopping? 0 0  Preparing Food and eating ? N N  Using the Toilet? N N  In the past six months, have you accidently leaked urine? N   Do you have problems with loss of bowel control? N N  Managing your Medications? N N  Managing your Finances? N N  Housekeeping or managing your Housekeeping? N N    Patient Care Team: [place], Betty G, MD as PCP - General (Family Medicine) [place], Betty G, MD (Family Medicine) Josepha Mliss ORN, RN as Case Manager Jerri Kay HERO, MD as Attending Physician (Orthopedic Surgery)  I have updated your Care Teams any recent Medical Services you may have  received from other providers in the past year.  Assessment:   This is a routine wellness examination for Philip Bates.  Hearing/Vision screen Hearing Screening - Comments:: Denies hearing difficulties   Vision Screening - Comments:: Wears rx glasses - up to date with routine eye exams with  [facility]    Goals Addressed               This Visit's Progress     Increase physical activity (pt-stated)        Remain active       Depression Screen     06/15/2024   10:42 AM 04/27/2024   10:24 AM 03/29/2024    1:21 PM 03/03/2024    9:52 AM 12/02/2023    1:45 PM 11/05/2023   10:58 AM 09/07/2023    1:30 PM  PHQ 2/9 Scores  PHQ - 2 Score 0 0 0 2 0 0 2  PHQ- 9 Score  0 0 4       Fall Risk     06/15/2024   10:44 AM 06/14/2024   10:31 AM 04/27/2024   10:24 AM 03/29/2024    1:21 PM 03/03/2024    9:52 AM  Fall Risk   Falls in the past year? 0 0 0 0 0  Number falls in past yr: 0 0     Injury with Fall? 0 0     Risk for fall due to : No Fall Risks      Follow up Falls evaluation completed        MEDICARE RISK AT HOME:  Medicare Risk at Home Any stairs in or around the home?: No If so, are there any without handrails?: No Home free of loose throw rugs in walkways, pet beds, electrical cords, etc?: Yes Adequate lighting in your home to reduce risk of falls?: Yes Life alert?: No Use of a cane, walker or w/c?: Yes Grab bars in the bathroom?: Yes Shower chair or bench in shower?: Yes Elevated toilet seat or a handicapped toilet?: No  TIMED UP AND GO:  Was the test performed?  No  Cognitive Function: 6CIT completed        06/15/2024   10:44 AM 06/12/2023   10:31 AM 04/16/2022    2:33 PM 04/09/2021    3:31 PM  6CIT Screen  What Year? 0 points 0 points 0 points 0 points  What month? 0 points 0 points 0 points 0 points  What time? 0 points 0 points 0 points 0 points  Count back from 20 0 points 0 points 0 points 0 points  Months in reverse 0 points 0 points 0 points 0 points   Repeat phrase 0 points 0 points 0 points 0 points  Total Score 0 points 0 points 0 points 0 points    Immunizations Immunization History  Administered Date(s) Administered   Influenza,inj,Quad PF,6+ Mos 06/01/2017   PFIZER Comirnaty(Gray Top)Covid-19 Tri-Sucrose Vaccine 12/30/2019, 01/21/2020, 07/25/2020   PNEUMOCOCCAL CONJUGATE-20 04/29/2024   Pfizer Covid-19 Vaccine Bivalent Booster 51yrs & up 07/01/2021   Pfizer(Comirnaty)Fall Seasonal Vaccine 12 years and older 07/31/2022   Pneumococcal Polysaccharide-23 07/25/2020   Tdap 08/14/2020   Zoster Recombinant(Shingrix) 07/25/2020, 11/11/2020    Screening Tests Health Maintenance  Topic Date Due   Influenza Vaccine  05/06/2024   COVID-19 Vaccine (6 - 2025-26 season) 06/06/2024   Lung Cancer Screening  05/21/2025   Medicare Annual Wellness (AWV)  06/15/2025   DTaP/Tdap/Td (2 - Td or Tdap) 08/14/2030   Colonoscopy  11/03/2033   Pneumococcal Vaccine: 50+ Years  Completed  Hepatitis C Screening  Completed   HIV Screening  Completed   Hepatitis B Vaccines 19-59 Average Risk  Aged Out   HPV VACCINES  Aged Out   Meningococcal B Vaccine  Aged Out   Zoster Vaccines- Shingrix  Discontinued    Health Maintenance Items Addressed:   Additional Screening:  Vision Screening: Recommended annual ophthalmology exams for early detection of glaucoma and other disorders of the eye. Is the patient up to date with their annual eye exam?  Yes  Who is the provider or what is the name of the office in which the patient attends annual eye exams? [facility]  Dental Screening: Recommended annual dental exams for proper oral hygiene  Community Resource Referral / Chronic Care Management: CRR required this visit?  No   CCM required this visit?  No   Plan:    I have personally reviewed and noted the following in the patient's chart:   Medical and social history Use of alcohol , tobacco or illicit drugs  Current medications and  supplements including opioid prescriptions. Patient is currently taking opioid prescriptions. Information provided to patient regarding non-opioid alternatives. Patient advised to discuss non-opioid treatment plan with their provider. Functional ability and status Nutritional status Physical activity Advanced directives List of other physicians Hospitalizations, surgeries, and ER visits in previous 12 months Vitals Screenings to include cognitive, depression, and falls Referrals and appointments  In addition, I have reviewed and discussed with patient certain preventive protocols, quality metrics, and best practice recommendations. A written personalized care plan for preventive services as well as general preventive health recommendations were provided to patient.   Rojelio LELON Blush, LPN   [id number]   After Visit Summary: (MyChart) Due to this being a telephonic visit, the after visit summary with patients personalized plan was offered to patient via MyChart   Notes: Nothing significant to report at this time.

## 2024-06-16 ENCOUNTER — Other Ambulatory Visit: Payer: Self-pay

## 2024-06-16 DIAGNOSIS — R399 Unspecified symptoms and signs involving the genitourinary system: Secondary | ICD-10-CM

## 2024-06-16 MED ORDER — SOLIFENACIN SUCCINATE 10 MG PO TABS: 10.0000 mg | ORAL_TABLET | Freq: Every day | ORAL | 11 refills | Status: AC

## 2024-06-22 ENCOUNTER — Encounter (HOSPITAL_BASED_OUTPATIENT_CLINIC_OR_DEPARTMENT_OTHER): Admitting: Registered Nurse

## 2024-06-22 ENCOUNTER — Encounter: Payer: Self-pay | Admitting: Registered Nurse

## 2024-06-22 DIAGNOSIS — M961 Postlaminectomy syndrome, not elsewhere classified: Secondary | ICD-10-CM | POA: Diagnosis not present

## 2024-06-22 DIAGNOSIS — G8929 Other chronic pain: Secondary | ICD-10-CM

## 2024-06-22 DIAGNOSIS — G894 Chronic pain syndrome: Secondary | ICD-10-CM | POA: Diagnosis not present

## 2024-06-22 DIAGNOSIS — M545 Low back pain, unspecified: Secondary | ICD-10-CM | POA: Diagnosis not present

## 2024-06-22 DIAGNOSIS — M25511 Pain in right shoulder: Secondary | ICD-10-CM

## 2024-06-22 DIAGNOSIS — M25512 Pain in left shoulder: Secondary | ICD-10-CM | POA: Diagnosis not present

## 2024-06-22 DIAGNOSIS — M25552 Pain in left hip: Secondary | ICD-10-CM

## 2024-06-22 DIAGNOSIS — S73199S Other sprain of unspecified hip, sequela: Secondary | ICD-10-CM | POA: Diagnosis not present

## 2024-06-22 MED ORDER — OXYCODONE-ACETAMINOPHEN 10-325 MG PO TABS
1.0000 | ORAL_TABLET | ORAL | 0 refills | Status: DC | PRN
Start: 2024-06-22 — End: 2024-06-22

## 2024-06-22 MED ORDER — OXYCODONE-ACETAMINOPHEN 10-325 MG PO TABS
1.0000 | ORAL_TABLET | ORAL | 0 refills | Status: DC | PRN
Start: 2024-06-22 — End: 2024-07-22

## 2024-06-22 NOTE — Progress Notes (Signed)
 Subjective:    Patient ID: Philip Bates, male    DOB: 1961/01/06, 63 y.o.   MRN: [id number]  HPI: Philip Bates is a 63 y.o. male who returns for follow up appointment for chronic pain and medication refill. He states his pain is located in his bilateral shoulders, left hip and left knee pain. He rates his pain 8. His current exercise regime is walking and performing stretching exercises.  Dr Lorilee prescribed Xtampza  on 06/14/2024, and he is taking his Oxycodone  IR every 4 hours as prescribed. Educated Philip Bates how to take his Xtampza  and Oxycodone  , he verbalizes understanding. This provider called Dr Lorilee regarding her Oxycodone  IR prescription, she agrees with resuming Oxycodone  as prescribe previously with a goal to decrease to 5 times a day as needed for pain. Philip Bates was called regarding the above, he verbalizes understanding.   Philip Bates  Morphine  equivalent is 120.00 MME.  Last UDS was Performed on  04/27/2024, it was consistent.    Pain Inventory Average Pain 8 Pain Right Now 8 My pain is intermittent, sharp, burning, dull, stabbing, tingling, and aching  In the last 24 hours, has pain interfered with the following? General activity 2 Relation with others 0 Enjoyment of life 0 What TIME of day is your pain at its worst? daytime, evening, and night Sleep (in general) Poor  Pain is worse with: walking, sitting, and inactivity Pain improves with: rest and medication Relief from Meds: 8  Family History  Problem Relation Age of Onset   Esophageal cancer Father    Cancer Father        esophagus   Diabetes Brother    Diabetes Paternal Uncle    Colon cancer Neg Hx    Rectal cancer Neg Hx    Stomach cancer Neg Hx    Social History   Socioeconomic History   Marital status: Married    Spouse name: Not on file   Number of children: Not on file   Years of education: Not on file   Highest education level: Some college, no degree  Occupational History   Not on file   Tobacco Use   Smoking status: Every Day    Current packs/day: 1.00    Average packs/day: 1 pack/day for 35.0 years (35.0 ttl pk-yrs)    Types: Cigarettes   Smokeless tobacco: Never  Vaping Use   Vaping status: Never Used  Substance and Sexual Activity   Alcohol  use: Never   Drug use: Never   Sexual activity: Yes  Other Topics Concern   Not on file  Social History Narrative   ** Merged History Encounter **       Social Drivers of Health   Financial Resource Strain: Low Risk  (06/15/2024)   Overall Financial Resource Strain (CARDIA)    Difficulty of Paying Living Expenses: Not hard at all  Recent Concern: Physicist, medical Strain - High Risk (04/26/2024)   Overall Financial Resource Strain (CARDIA)    Difficulty of Paying Living Expenses: Hard  Food Insecurity: No Food Insecurity (06/15/2024)   Hunger Vital Sign    Worried About Running Out of Food in the Last Year: Never true    Ran Out of Food in the Last Year: Never true  Recent Concern: Food Insecurity - Food Insecurity Present (06/07/2024)   Received from [facility]   Hunger Vital Sign    Within the past 12 months, you worried that your food would run out before you got the money to  buy more.: Sometimes true    Within the past 12 months, the food you bought just didn't last and you didn't have money to get more.: Sometimes true  Transportation Needs: No Transportation Needs (06/15/2024)   PRAPARE - Administrator, Civil Service (Medical): No    Lack of Transportation (Non-Medical): No  Recent Concern: Transportation Needs - Unmet Transportation Needs (06/07/2024)   Received from [facility] - Transportation    In the past 12 months, has lack of transportation kept you from medical appointments or from getting medications?: Yes    In the past 12 months, has lack of transportation kept you from meetings, work, or from getting things needed for daily living?: Yes  Physical Activity: Inactive  (06/15/2024)   Exercise Vital Sign    Days of Exercise per Week: 0 days    Minutes of Exercise per Session: 0 min  Stress: No Stress Concern Present (06/15/2024)   [employer] of Occupational Health - Occupational Stress Questionnaire    Feeling of Stress: Not at all  Social Connections: Moderately Isolated (06/15/2024)   Social Connection and Isolation Panel    Frequency of Communication with Friends and Family: More than three times a week    Frequency of Social Gatherings with Friends and Family: More than three times a week    Attends Religious Services: Never    Database administrator or Organizations: No    Attends Engineer, structural: Never    Marital Status: Married   Past Surgical History:  Procedure Laterality Date   ANTERIOR CERVICAL DECOMPRESSION/DISCECTOMY FUSION 4 LEVELS N/A 08/11/2017   Procedure: Cervical three to Cervical seven Anterior Cervical discectomy with fusion/plate fixation;  Surgeon: Ditty, Morene Hicks, MD;  Location: [facility] OR;  Service: Neurosurgery;  Laterality: N/A;   BACK SURGERY     HERNIA REPAIR     inguinal hernia repair in early 2000   LEG SURGERY     POSTERIOR CERVICAL FUSION/FORAMINOTOMY N/A 08/12/2018   Procedure: POSTERIOR CERVICAL FUSION WITH LATERAL MASS FIXATION CERVICAL THREE TO CERVICAL SEVEN;  Surgeon: Lanis Pupa, MD;  Location: [facility] OR;  Service: Neurosurgery;  Laterality: N/A;   SHOULDER ARTHROSCOPY WITH ROTATOR CUFF REPAIR AND SUBACROMIAL DECOMPRESSION Right 02/02/2018   Procedure: RIGHT SHOULDER ARTHROSCOPY WITH SUBACROMIAL DECOMPRESSION, MINI-OPEN ROTATOR CUFF REPAIR;  Surgeon: Addie Cordella Hamilton, MD;  Location: [facility] OR;  Service: Orthopedics;  Laterality: Right;   SHOULDER SURGERY Right    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   Procedure: LEFT TOTAL HIP ARTHROPLASTY ANTERIOR APPROACH;  Surgeon: Jerri Kay HERO, MD;  Location: [facility] OR;  Service: Orthopedics;  Laterality: Left;   Past Surgical History:  Procedure Laterality  Date   ANTERIOR CERVICAL DECOMPRESSION/DISCECTOMY FUSION 4 LEVELS N/A 08/11/2017   Procedure: Cervical three to Cervical seven Anterior Cervical discectomy with fusion/plate fixation;  Surgeon: Ditty, Morene Hicks, MD;  Location: [facility] OR;  Service: Neurosurgery;  Laterality: N/A;   BACK SURGERY     HERNIA REPAIR     inguinal hernia repair in early 2000   LEG SURGERY     POSTERIOR CERVICAL FUSION/FORAMINOTOMY N/A 08/12/2018   Procedure: POSTERIOR CERVICAL FUSION WITH LATERAL MASS FIXATION CERVICAL THREE TO CERVICAL SEVEN;  Surgeon: Lanis Pupa, MD;  Location: [facility] OR;  Service: Neurosurgery;  Laterality: N/A;   SHOULDER ARTHROSCOPY WITH ROTATOR CUFF REPAIR AND SUBACROMIAL DECOMPRESSION Right 02/02/2018   Procedure: RIGHT SHOULDER ARTHROSCOPY WITH SUBACROMIAL DECOMPRESSION, MINI-OPEN ROTATOR CUFF REPAIR;  Surgeon: Addie Cordella Hamilton, MD;  Location: [facility] OR;  Service: Orthopedics;  Laterality: Right;   SHOULDER SURGERY Right    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   Procedure: LEFT TOTAL HIP ARTHROPLASTY ANTERIOR APPROACH;  Surgeon: Jerri Kay HERO, MD;  Location: [facility] OR;  Service: Orthopedics;  Laterality: Left;   Past Medical History:  Diagnosis Date   Arthritis    Cerebral palsy (HCC)    Cervical radiculopathy    H/O umbilical hernia repair 2001   Pre-diabetes    Scoliosis    Spondylosis of cervical spine    BP (!) 146/82 (BP Location: Left Arm, Patient Position: Sitting, Cuff Size: Normal)   Pulse 78   Ht 5' 4 (1.626 m)   Wt 120 lb 3.2 oz (54.5 kg)   SpO2 99%   BMI 20.63 kg/m   Opioid Risk Score:   Fall Risk Score:  `1  Depression screen [facility] 2/9     06/22/2024    9:41 AM 06/15/2024   10:42 AM 04/27/2024   10:24 AM 03/29/2024    1:21 PM 03/03/2024    9:52 AM 12/02/2023    1:45 PM 11/05/2023   10:58 AM  Depression screen PHQ 2/9  Decreased Interest 3 0 0  1 0 0  Down, Depressed, Hopeless 2 0 0 0 1 0 0  PHQ - 2 Score 5 0 0 0 2 0 0  Altered sleeping 3  0 0 1    Tired,  decreased energy 0  0 0 1    Change in appetite 2  0 0 0    Feeling bad or failure about yourself  1  0 0 0    Trouble concentrating 0  0 0 0    Moving slowly or fidgety/restless 0  0 0 0    Suicidal thoughts 0  0 0 0    PHQ-9 Score 11  0 0 4    Difficult doing work/chores Somewhat difficult   Somewhat difficult Somewhat difficult        Review of Systems  Musculoskeletal:  Positive for arthralgias, back pain and myalgias.       Bilateral shoulder pain, left hip, knee and foot pain  All other systems reviewed and are negative.      Objective:   Physical Exam Vitals and nursing note reviewed.  Constitutional:      Appearance: Normal appearance.  Cardiovascular:     Rate and Rhythm: Normal rate and regular rhythm.     Pulses: Normal pulses.     Heart sounds: Normal heart sounds.  Pulmonary:     Effort: Pulmonary effort is normal.     Breath sounds: Normal breath sounds.  Musculoskeletal:     Comments: Normal Muscle Bulk and Muscle Testing Reveals:  Upper Extremities: Full ROM and Muscle Strength 5/5 Bilateral AC Joint Tenderness Left Greater Trochanter Tenderness Lower Extremities: Decreased ROM and Muscle Strength 5/5 Left Lower Extremity Flexion Produces Pain into his Left Patella Wearing his Left AFO Arises from table slowly using cane for support Antalgic  Gait     Skin:    General: Skin is warm and dry.  Neurological:     Mental Status: He is alert and oriented to person, place, and time.  Psychiatric:        Mood and Affect: Mood normal.        Behavior: Behavior normal.          Assessment & Plan:  Cervicalgia/ Cervical Radiculitis: No complaints today. Continue HEP as Tolerated. Continue to Monitor.  Continue Gabapentin . Continue to Monitor. 06/22/2024 Chronic Bilateral;  Shoulder Pain:  Continue HEP as Tolerated. Continue to Monitor. Ortho Following. 06/22/2024 Failed Back Syndrome: Continue HEP as Tolerated. Continue to Monitor. 06/22/2024 Chronic  Bilateral Low Back Pain without Sciatica: Continue HEP as Tolerated. Continue current medication regimen. Continue to Monitor. 06/22/2024 Chronic Left Hip Pain: S/P On 09/10/2020: Dr Jerri LEFT TOTAL HIP ARTHROPLASTY ANTERIOR APPROACH Continue HEP as Tolerated. Continue current medication regimen. Continue to Monitor. 06/22/2024 Chronic Left knee Pain: Continue HEP as Tolerated. Continue to Monitor. No complaints today. Continue to Monitor. 06/22/2024 Left Foot Drop: Wearing AFO: [employer] Following. Continue to Monitor. 04/27/2024 Chronic Pain Syndrome:  Continue Xtampza  9 mg every 12 hours. Refilled Oxycodone  10/325 mg one tablet every 4 hours as needed for pain #180.  We will continue the opioid monitoring program, this consists of regular clinic visits, examinations, urine drug screen, pill counts as well as use of [place]  Controlled Substance Reporting system. A 12 month History has been reviewed on the [place]  Controlled Substance Reporting System on 06/22/2024   F/U in 1 month

## 2024-06-23 ENCOUNTER — Telehealth: Payer: Self-pay | Admitting: Orthopedic Surgery

## 2024-06-23 NOTE — Telephone Encounter (Signed)
 Left voicemail advising patient that the CD of his xrays was ready for pickup at the front desk but that we were unable to copy the CT scans and he would need to get those from whichever facility he had them done at.

## 2024-06-23 NOTE — Telephone Encounter (Signed)
 Philip Bates is calling in to request his x-rays and CT scan be put on disc so that he could pick them up.  I advised that we could do the x-rays, but I was unsure about the CT scan.  His call back number is [phone]

## 2024-06-29 ENCOUNTER — Ambulatory Visit (INDEPENDENT_AMBULATORY_CARE_PROVIDER_SITE_OTHER): Admitting: Urology

## 2024-06-29 ENCOUNTER — Encounter: Payer: Self-pay | Admitting: Urology

## 2024-06-29 VITALS — BP 116/76 | HR 65 | Ht 64.0 in | Wt 120.0 lb

## 2024-06-29 DIAGNOSIS — R399 Unspecified symptoms and signs involving the genitourinary system: Secondary | ICD-10-CM | POA: Diagnosis not present

## 2024-06-29 LAB — BLADDER SCAN AMB NON-IMAGING

## 2024-06-29 LAB — URINALYSIS, ROUTINE W REFLEX MICROSCOPIC
Bilirubin, UA: NEGATIVE
Glucose, UA: NEGATIVE
Ketones, UA: NEGATIVE
Leukocytes,UA: NEGATIVE
Nitrite, UA: NEGATIVE
Protein,UA: NEGATIVE
RBC, UA: NEGATIVE
Specific Gravity, UA: 1.015 (ref 1.005–1.030)
Urobilinogen, Ur: 0.2 mg/dL (ref 0.2–1.0)
pH, UA: 5.5 (ref 5.0–7.5)

## 2024-06-29 NOTE — Progress Notes (Signed)
 Assessment: 1. Lower urinary tract symptoms     Plan: Continue solifenacin  10 mg daily. Continue tamsulosin . Return to office in 4 months  Chief Complaint:  Chief Complaint  Patient presents with   LUTS    History of Present Illness:  Philip Bates is a 63 y.o. male who is seen for further evaluation of lower urinary tract symptoms. At his visit in June 2025, he reported symptoms for approximately 1 year.  His symptoms included frequency, voiding every 2-3 hours, nocturia x 2, urgency, and occasional urge incontinence.  He was voiding with a good stream and felt like he emptied his bladder completely.  No dysuria or gross hematuria.  No history of UTIs or prostatitis. He had been on tamsulosin  for approximately 1 year without a significant change in his symptoms.  He reported some dizziness associated with the tamsulosin . IPSS = 14/4. PVR = 40 ml.  PSA results: 9/22 1.54 6/24 1.3  He was given a trial of solifenacin  5 mg daily.  At his visit in August 2025, he continued on solifenacin  5 mg daily.  He was also taking tamsulosin  0.4 mg daily.  He had not seen a significant improvement in his lower urinary tract symptoms.  He continued with urgency, frequency, and nocturia x 3.  He was also having occasional urge incontinence.  No dysuria or gross hematuria.  No side effects from the solifenacin . IPSS = 20/6. PVR = 0 ml. His dose of solifenacin  was increased to 10 mg daily.  He returns today for follow-up.  He continues on tamsulosin  and solifenacin  10 mg daily.  He has noted improvement in his urinary symptoms.  He has decreased urgency, frequency, and nocturia.  He is not having any incontinence.  He feels like he is emptying his bladder well.  No side effects. IPSS = 18/2  Portions of the above documentation were copied from a prior visit for review purposes only.   Past Medical History:  Past Medical History:  Diagnosis Date   Arthritis    Cerebral palsy (HCC)     Cervical radiculopathy    H/O umbilical hernia repair 2001   Pre-diabetes    Scoliosis    Spondylosis of cervical spine     Past Surgical History:  Past Surgical History:  Procedure Laterality Date   ANTERIOR CERVICAL DECOMPRESSION/DISCECTOMY FUSION 4 LEVELS N/A 08/11/2017   Procedure: Cervical three to Cervical seven Anterior Cervical discectomy with fusion/plate fixation;  Surgeon: Ditty, Morene Hicks, MD;  Location: [facility] OR;  Service: Neurosurgery;  Laterality: N/A;   BACK SURGERY     HERNIA REPAIR     inguinal hernia repair in early 2000   LEG SURGERY     POSTERIOR CERVICAL FUSION/FORAMINOTOMY N/A 08/12/2018   Procedure: POSTERIOR CERVICAL FUSION WITH LATERAL MASS FIXATION CERVICAL THREE TO CERVICAL SEVEN;  Surgeon: Lanis Pupa, MD;  Location: [facility] OR;  Service: Neurosurgery;  Laterality: N/A;   SHOULDER ARTHROSCOPY WITH ROTATOR CUFF REPAIR AND SUBACROMIAL DECOMPRESSION Right 02/02/2018   Procedure: RIGHT SHOULDER ARTHROSCOPY WITH SUBACROMIAL DECOMPRESSION, MINI-OPEN ROTATOR CUFF REPAIR;  Surgeon: Addie Cordella Hamilton, MD;  Location: [facility] OR;  Service: Orthopedics;  Laterality: Right;   SHOULDER SURGERY Right    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   Procedure: LEFT TOTAL HIP ARTHROPLASTY ANTERIOR APPROACH;  Surgeon: Jerri Kay HERO, MD;  Location: [facility] OR;  Service: Orthopedics;  Laterality: Left;    Allergies:  No Known Allergies  Family History:  Family History  Problem Relation Age of Onset  Esophageal cancer Father    Cancer Father        esophagus   Diabetes Brother    Diabetes Paternal Uncle    Colon cancer Neg Hx    Rectal cancer Neg Hx    Stomach cancer Neg Hx     Social History:  Social History   Tobacco Use   Smoking status: Every Day    Current packs/day: 1.00    Average packs/day: 1 pack/day for 35.0 years (35.0 ttl pk-yrs)    Types: Cigarettes   Smokeless tobacco: Never  Vaping Use   Vaping status: Never Used  Substance Use Topics   Alcohol  use:  Never   Drug use: Never    ROS: Constitutional:  Negative for fever, chills, weight loss CV: Negative for chest pain, previous MI, hypertension Respiratory:  Negative for shortness of breath, wheezing, sleep apnea, frequent cough GI:  Negative for nausea, vomiting, bloody stool, GERD  Physical exam: BP 116/76   Pulse 65   Ht 5' 4 (1.626 m)   Wt 120 lb (54.4 kg)   BMI 20.60 kg/m  GENERAL APPEARANCE:  Well appearing, well developed, well nourished, NAD HEENT:  Atraumatic, normocephalic, oropharynx clear NECK:  Supple without lymphadenopathy or thyromegaly ABDOMEN:  Soft, non-tender, no masses EXTREMITIES:  Moves all extremities well, without clubbing, cyanosis, or edema NEUROLOGIC:  Alert and oriented x 3, CN II-XII grossly intact MENTAL STATUS:  appropriate BACK:  Non-tender to palpation, No CVAT SKIN:  Warm, dry, and intact   Results: U/A: Negative  PVR = 83 ml

## 2024-07-07 ENCOUNTER — Other Ambulatory Visit: Payer: Self-pay | Admitting: Hematology and Oncology

## 2024-07-07 DIAGNOSIS — D509 Iron deficiency anemia, unspecified: Secondary | ICD-10-CM

## 2024-07-07 NOTE — Progress Notes (Unsigned)
 [facility] [phone]   [phone]) [phone]  PROGRESS NOTE  Patient Care Team: [place], Betty G, MD as PCP - General (Family Medicine) [place], Betty G, MD (Family Medicine) Josepha Mliss ORN, RN as Case Manager Jerri Kay HERO, MD as Attending Physician (Orthopedic Surgery)  CHIEF COMPLAINTS/PURPOSE OF CONSULTATION:  Iron deficiency anemia  HISTORY OF PRESENTING ILLNESS:  Philip Bates 63 y.o. male returns for a follow up for iron deficiency anemia. He was last seen on 01/05/24. In the interim, he continues on PO iron therapy.   On exam today, Philip Bates reports having fatigue but some days worse than others.  He continues to complete his ADLs but it is impacted due to left hip pain.  He is eating well without any major weight changes.  He denies nausea, vomiting or bowel habit changes.  Patient denies easy bruising or signs of active bleeding including hematochezia or melena.  Patient denies fevers, chills, night sweats, shortness of breath, chest pain or cough.  He has no other complaints.  Rest of the 10 point ROS as below.  MEDICAL HISTORY:  Past Medical History:  Diagnosis Date   Arthritis    Cerebral palsy (HCC)    Cervical radiculopathy    H/O umbilical hernia repair 2001   Pre-diabetes    Scoliosis    Spondylosis of cervical spine     SURGICAL HISTORY: Past Surgical History:  Procedure Laterality Date   ANTERIOR CERVICAL DECOMPRESSION/DISCECTOMY FUSION 4 LEVELS N/A 08/11/2017   Procedure: Cervical three to Cervical seven Anterior Cervical discectomy with fusion/plate fixation;  Surgeon: Ditty, Morene Hicks, MD;  Location: [facility] OR;  Service: Neurosurgery;  Laterality: N/A;   BACK SURGERY     HERNIA REPAIR     inguinal hernia repair in early 2000   LEG SURGERY     POSTERIOR CERVICAL FUSION/FORAMINOTOMY N/A 08/12/2018   Procedure: POSTERIOR CERVICAL FUSION WITH LATERAL MASS FIXATION CERVICAL THREE TO CERVICAL SEVEN;  Surgeon: Lanis Pupa, MD;   Location: [facility] OR;  Service: Neurosurgery;  Laterality: N/A;   SHOULDER ARTHROSCOPY WITH ROTATOR CUFF REPAIR AND SUBACROMIAL DECOMPRESSION Right 02/02/2018   Procedure: RIGHT SHOULDER ARTHROSCOPY WITH SUBACROMIAL DECOMPRESSION, MINI-OPEN ROTATOR CUFF REPAIR;  Surgeon: Addie Cordella Hamilton, MD;  Location: [facility] OR;  Service: Orthopedics;  Laterality: Right;   SHOULDER SURGERY Right    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   Procedure: LEFT TOTAL HIP ARTHROPLASTY ANTERIOR APPROACH;  Surgeon: Jerri Kay HERO, MD;  Location: [facility] OR;  Service: Orthopedics;  Laterality: Left;    SOCIAL HISTORY: Social History   Socioeconomic History   Marital status: Married    Spouse name: Not on file   Number of children: Not on file   Years of education: Not on file   Highest education level: Some college, no degree  Occupational History   Not on file  Tobacco Use   Smoking status: Every Day    Current packs/day: 1.00    Average packs/day: 1 pack/day for 35.0 years (35.0 ttl pk-yrs)    Types: Cigarettes   Smokeless tobacco: Never  Vaping Use   Vaping status: Never Used  Substance and Sexual Activity   Alcohol  use: Never   Drug use: Never   Sexual activity: Yes  Other Topics Concern   Not on file  Social History Narrative   ** Merged History Encounter **       Social Drivers of Health   Financial Resource Strain: Low Risk  (06/15/2024)   Overall Financial Resource Strain (CARDIA)  Difficulty of Paying Living Expenses: Not hard at all  Recent Concern: Financial Resource Strain - High Risk (04/26/2024)   Overall Financial Resource Strain (CARDIA)    Difficulty of Paying Living Expenses: Hard  Food Insecurity: No Food Insecurity (06/15/2024)   Hunger Vital Sign    Worried About Running Out of Food in the Last Year: Never true    Ran Out of Food in the Last Year: Never true  Recent Concern: Food Insecurity - Food Insecurity Present (06/07/2024)   Received from [facility]   Hunger Vital Sign     Within the past 12 months, you worried that your food would run out before you got the money to buy more.: Sometimes true    Within the past 12 months, the food you bought just didn't last and you didn't have money to get more.: Sometimes true  Transportation Needs: No Transportation Needs (06/15/2024)   PRAPARE - Administrator, Civil Service (Medical): No    Lack of Transportation (Non-Medical): No  Recent Concern: Transportation Needs - Unmet Transportation Needs (06/07/2024)   Received from [facility] - Transportation    In the past 12 months, has lack of transportation kept you from medical appointments or from getting medications?: Yes    In the past 12 months, has lack of transportation kept you from meetings, work, or from getting things needed for daily living?: Yes  Physical Activity: Inactive (06/15/2024)   Exercise Vital Sign    Days of Exercise per Week: 0 days    Minutes of Exercise per Session: 0 min  Stress: No Stress Concern Present (06/15/2024)   [employer] of Occupational Health - Occupational Stress Questionnaire    Feeling of Stress: Not at all  Social Connections: Moderately Isolated (06/15/2024)   Social Connection and Isolation Panel    Frequency of Communication with Friends and Family: More than three times a week    Frequency of Social Gatherings with Friends and Family: More than three times a week    Attends Religious Services: Never    Database administrator or Organizations: No    Attends Banker Meetings: Never    Marital Status: Married  Catering manager Violence: Not At Risk (06/15/2024)   Humiliation, Afraid, Rape, and Kick questionnaire    Fear of Current or Ex-Partner: No    Emotionally Abused: No    Physically Abused: No    Sexually Abused: No    FAMILY HISTORY: Family History  Problem Relation Age of Onset   Esophageal cancer Father    Cancer Father        esophagus   Diabetes Brother    Diabetes  Paternal Uncle    Colon cancer Neg Hx    Rectal cancer Neg Hx    Stomach cancer Neg Hx     ALLERGIES:  has no known allergies.  MEDICATIONS:  Current Outpatient Medications  Medication Sig Dispense Refill   aspirin  EC 81 MG tablet Take 1 tablet (81 mg total) by mouth 2 (two) times daily. 84 tablet 0   ergocalciferol (VITAMIN D2) 1.25 MG (50000 UT) capsule 1 (ONE) CAPSULE CAPSULE BY MOUTH ONCE A WEEK     ferrous sulfate  325 (65 FE) MG tablet Take 1 tablet (325 mg total) by mouth daily with breakfast. Please take with a source of Vitamin C 90 tablet 3   gabapentin  (NEURONTIN ) 400 MG capsule Take 1 capsule (400 mg total) by mouth 3 (three) times daily.  90 capsule 3   ibuprofen  (ADVIL ) 800 MG tablet TAKE 1 TABLET BY MOUTH EVERY 8 HOURS AS NEEDED 30 tablet 0   latanoprost (XALATAN) 0.005 % ophthalmic solution SMARTSIG:1 Drop(s) In Eye(s) Every Evening     NARCAN 4 MG/0.1ML LIQD nasal spray kit Place 1 spray into the nose as needed (opioid overdose).     omeprazole  (PRILOSEC) 40 MG capsule Take 1 capsule (40 mg total) by mouth daily before breakfast. 60 capsule 0   oxyCODONE  ER (XTAMPZA  ER) 9 MG C12A Take 1 capsule by mouth every 12 (twelve) hours. 60 capsule 0   oxyCODONE -acetaminophen  (PERCOCET) 10-325 MG tablet Take 1 tablet by mouth every 4 (four) hours as needed for pain. 180 tablet 0   sildenafil  (REVATIO ) 20 MG tablet TAKE 2 TO 3 TABLETS BY MOUTH ONCE DAILY AS NEEDED 90 tablet 3   solifenacin  (VESICARE ) 10 MG tablet Take 1 tablet (10 mg total) by mouth daily. 30 tablet 11   tamsulosin  (FLOMAX ) 0.4 MG CAPS capsule Take 1 capsule (0.4 mg total) by mouth daily after supper. 90 capsule 3   No current facility-administered medications for this visit.    REVIEW OF SYSTEMS:   Constitutional: ( - ) fevers, ( - )  chills , ( - ) night sweats Eyes: ( - ) blurriness of vision, ( - ) double vision, ( - ) watery eyes Ears, nose, mouth, throat, and face: ( - ) mucositis, ( - ) sore  throat Respiratory: ( - ) cough, ( - ) dyspnea, ( - ) wheezes Cardiovascular: ( - ) palpitation, ( - ) chest discomfort, ( - ) lower extremity swelling Gastrointestinal:  ( - ) nausea, ( - ) heartburn, ( - ) change in bowel habits Skin: ( - ) abnormal skin rashes Lymphatics: ( - ) new lymphadenopathy, ( - ) easy bruising Neurological: ( - ) numbness, ( - ) tingling, ( - ) new weaknesses Behavioral/Psych: ( - ) mood change, ( - ) new changes  All other systems were reviewed with the patient and are negative.  PHYSICAL EXAMINATION: ECOG PERFORMANCE STATUS: 1 - Symptomatic but completely ambulatory  Vitals:   07/08/24 1315  BP: 138/86  Pulse: 64  Resp: 13  Temp: 98.1 F (36.7 C)  SpO2: 100%    Filed Weights   07/08/24 1315  Weight: 118 lb 14.4 oz (53.9 kg)    GENERAL: well appearing male in NAD  SKIN: skin color, texture, turgor are normal, no rashes or significant lesions EYES: conjunctiva are pink and non-injected, sclera clear LUNGS: clear to auscultation and percussion with normal breathing effort HEART: regular rate & rhythm and no murmurs and no lower extremity edema Musculoskeletal: no cyanosis of digits and no clubbing  PSYCH: alert & oriented x 3, fluent speech NEURO: no focal motor/sensory deficits  LABORATORY DATA:  I have reviewed the data as listed    Latest Ref Rng & Units 07/08/2024   12:17 PM 05/14/2024    8:11 PM 01/05/2024   10:35 AM  CBC  WBC 4.0 - 10.5 K/uL 4.8  8.2  5.2   Hemoglobin 13.0 - 17.0 g/dL 86.8  87.9  87.5   Hematocrit 39.0 - 52.0 % 38.3  36.2  35.5   Platelets 150 - 400 K/uL 270  302  184        Latest Ref Rng & Units 07/08/2024   12:17 PM 05/14/2024    8:11 PM 01/05/2024   10:35 AM  CMP  Glucose 70 -  99 mg/dL 62  89  895   BUN 8 - 23 mg/dL 14  14  13    Creatinine 0.61 - 1.24 mg/dL 9.34  9.21  9.32   Sodium 135 - 145 mmol/L 139  137  138   Potassium 3.5 - 5.1 mmol/L 3.6  3.4  3.7   Chloride 98 - 111 mmol/L 102  99  104   CO2 22 - 32  mmol/L 32  26  30   Calcium 8.9 - 10.3 mg/dL 9.8  9.2  9.3   Total Protein 6.5 - 8.1 g/dL 7.5   7.1   Total Bilirubin 0.0 - 1.2 mg/dL 0.2   0.3   Alkaline Phos 38 - 126 U/L 67   59   AST 15 - 41 U/L 16   21   ALT 0 - 44 U/L 17   19    RADIOGRAPHIC STUDIES: I have personally reviewed the radiological images as listed and agreed with the findings in the report. No results found.  ASSESSMENT & PLAN Yader Criger is a 63 y.o. male who returns for a follow up for iron deficiency anemia.   #Iron deficiency anemia 2/2 to GI Bleeding/H. Pylori: --Patient underwent EGD in January 2025 which showed evidence of H. pylori infection.  He has completed the antibiotic regimen.  Currently awaiting testing for eradication in stool. --Last received IV feraheme 510 mg x 2 doses from 07/17/23-07/24/23.  --Currently on PO iron replacement daily.  --Labs today show no anemia with Hgb 13.1, MCV 79.1. Iron panel shows some deficiency with saturation 16%, ferritin pending --Will arrange IV feraheme 510 mg x 1 dose to bolster iron levels.  --RTC in 6 months for labs/follow up.   No orders of the defined types were placed in this encounter.  All questions were answered. The patient knows to call the clinic with any problems, questions or concerns.  I have spent a total of 25 minutes minutes of face-to-face and non-face-to-face time, preparing to see the patient, performing a medically appropriate examination, counseling and educating the patient, ordering medications/tests/procedures, documenting clinical information in the electronic health record,  and care coordination.   Johnston Police PA-C Dept of Hematology and Oncology [facility] Phone: [phone]

## 2024-07-08 ENCOUNTER — Ambulatory Visit: Admitting: Hematology and Oncology

## 2024-07-08 ENCOUNTER — Telehealth: Payer: Self-pay | Admitting: Pharmacy Technician

## 2024-07-08 ENCOUNTER — Other Ambulatory Visit

## 2024-07-08 ENCOUNTER — Inpatient Hospital Stay (HOSPITAL_BASED_OUTPATIENT_CLINIC_OR_DEPARTMENT_OTHER): Admitting: Physician Assistant

## 2024-07-08 ENCOUNTER — Inpatient Hospital Stay: Attending: Physician Assistant

## 2024-07-08 VITALS — BP 138/86 | HR 64 | Temp 98.1°F | Resp 13 | Wt 118.9 lb

## 2024-07-08 DIAGNOSIS — F17210 Nicotine dependence, cigarettes, uncomplicated: Secondary | ICD-10-CM | POA: Diagnosis not present

## 2024-07-08 DIAGNOSIS — D509 Iron deficiency anemia, unspecified: Secondary | ICD-10-CM

## 2024-07-08 DIAGNOSIS — K922 Gastrointestinal hemorrhage, unspecified: Secondary | ICD-10-CM | POA: Insufficient documentation

## 2024-07-08 DIAGNOSIS — D500 Iron deficiency anemia secondary to blood loss (chronic): Secondary | ICD-10-CM | POA: Diagnosis present

## 2024-07-08 LAB — CBC WITH DIFFERENTIAL (CANCER CENTER ONLY)
Abs Immature Granulocytes: 0.01 K/uL (ref 0.00–0.07)
Basophils Absolute: 0.1 K/uL (ref 0.0–0.1)
Basophils Relative: 1 %
Eosinophils Absolute: 0.3 K/uL (ref 0.0–0.5)
Eosinophils Relative: 6 %
HCT: 38.3 % — ABNORMAL LOW (ref 39.0–52.0)
Hemoglobin: 13.1 g/dL (ref 13.0–17.0)
Immature Granulocytes: 0 %
Lymphocytes Relative: 46 %
Lymphs Abs: 2.2 K/uL (ref 0.7–4.0)
MCH: 27.1 pg (ref 26.0–34.0)
MCHC: 34.2 g/dL (ref 30.0–36.0)
MCV: 79.1 fL — ABNORMAL LOW (ref 80.0–100.0)
Monocytes Absolute: 0.5 K/uL (ref 0.1–1.0)
Monocytes Relative: 10 %
Neutro Abs: 1.8 K/uL (ref 1.7–7.7)
Neutrophils Relative %: 37 %
Platelet Count: 270 K/uL (ref 150–400)
RBC: 4.84 MIL/uL (ref 4.22–5.81)
RDW: 13.4 % (ref 11.5–15.5)
Smear Review: NORMAL
WBC Count: 4.8 K/uL (ref 4.0–10.5)
nRBC: 0 % (ref 0.0–0.2)

## 2024-07-08 LAB — CMP (CANCER CENTER ONLY)
ALT: 17 U/L (ref 0–44)
AST: 16 U/L (ref 15–41)
Albumin: 4.5 g/dL (ref 3.5–5.0)
Alkaline Phosphatase: 67 U/L (ref 38–126)
Anion gap: 5 (ref 5–15)
BUN: 14 mg/dL (ref 8–23)
CO2: 32 mmol/L (ref 22–32)
Calcium: 9.8 mg/dL (ref 8.9–10.3)
Chloride: 102 mmol/L (ref 98–111)
Creatinine: 0.65 mg/dL (ref 0.61–1.24)
GFR, Estimated: >60 mL/min (ref >60)
Glucose, Bld: 62 mg/dL — ABNORMAL LOW (ref 70–99)
Potassium: 3.6 mmol/L (ref 3.5–5.1)
Sodium: 139 mmol/L (ref 135–145)
Total Bilirubin: 0.2 mg/dL (ref 0.0–1.2)
Total Protein: 7.5 g/dL (ref 6.5–8.1)

## 2024-07-08 LAB — IRON AND IRON BINDING CAPACITY (CC-WL,HP ONLY)
Iron: 65 ug/dL (ref 45–182)
Saturation Ratios: 16 % — ABNORMAL LOW (ref 17.9–39.5)
TIBC: 407 ug/dL (ref 250–450)
UIBC: 342 ug/dL (ref 117–376)

## 2024-07-08 LAB — RETIC PANEL
Immature Retic Fract: 7.8 % (ref 2.3–15.9)
RBC.: 4.74 MIL/uL (ref 4.22–5.81)
Retic Count, Absolute: 57.4 K/uL (ref 19.0–186.0)
Retic Ct Pct: 1.2 % (ref 0.4–3.1)
Reticulocyte Hemoglobin: 32.1 pg (ref >27.9)

## 2024-07-08 LAB — FERRITIN: Ferritin: 163 ng/mL (ref 24–336)

## 2024-07-08 NOTE — Telephone Encounter (Addendum)
 Auth Submission: NO AUTH NEEDED Site of care: Site of care: [facility] Payer: [employer]HC DUAL Medication & CPT/J Code(s) submitted: Feraheme (ferumoxytol ) [id number] Diagnosis Code:  Route of submission (phone, fax, portal): PORTAL Phone # Fax # Auth type: Buy/Bill PB Units/visits requested: X1 Reference [id number] Approval from: 07/08/24 to 10/05/24

## 2024-07-18 ENCOUNTER — Ambulatory Visit

## 2024-07-18 VITALS — BP 148/89 | HR 59 | Temp 98.0°F | Resp 20 | Ht 64.0 in | Wt 118.8 lb

## 2024-07-18 DIAGNOSIS — D509 Iron deficiency anemia, unspecified: Secondary | ICD-10-CM | POA: Diagnosis not present

## 2024-07-18 MED ORDER — SODIUM CHLORIDE 0.9 % IV SOLN
510.0000 mg | Freq: Once | INTRAVENOUS | Status: AC
  Administered 2024-07-18: 510 mg via INTRAVENOUS
  Filled 2024-07-18: qty 17

## 2024-07-18 NOTE — Progress Notes (Signed)
 Diagnosis: Iron Deficiency Anemia  Provider:  Praveen Mannam MD  Procedure: IV Infusion  IV Type: Peripheral, IV Location: R Antecubital  Feraheme (Ferumoxytol ), Dose: 510 mg  Infusion Start Time: 1135  Infusion Stop Time: 1152  Post Infusion IV Care: Observation period completed and Peripheral IV Discontinued  Discharge: Condition: Good, Destination: Home . AVS Declined  Performed by:  Zaylyn Bergdoll, RN

## 2024-07-18 NOTE — Patient Instructions (Signed)

## 2024-07-22 ENCOUNTER — Telehealth: Payer: Self-pay | Admitting: Registered Nurse

## 2024-07-22 ENCOUNTER — Encounter: Attending: Physical Medicine and Rehabilitation | Admitting: Registered Nurse

## 2024-07-22 ENCOUNTER — Encounter: Payer: Self-pay | Admitting: Registered Nurse

## 2024-07-22 VITALS — BP 156/91 | HR 75 | Ht 64.0 in | Wt 124.2 lb

## 2024-07-22 DIAGNOSIS — Z79891 Long term (current) use of opiate analgesic: Secondary | ICD-10-CM | POA: Diagnosis not present

## 2024-07-22 DIAGNOSIS — M542 Cervicalgia: Secondary | ICD-10-CM | POA: Insufficient documentation

## 2024-07-22 DIAGNOSIS — M961 Postlaminectomy syndrome, not elsewhere classified: Secondary | ICD-10-CM | POA: Diagnosis not present

## 2024-07-22 DIAGNOSIS — M25511 Pain in right shoulder: Secondary | ICD-10-CM | POA: Diagnosis not present

## 2024-07-22 DIAGNOSIS — Z5181 Encounter for therapeutic drug level monitoring: Secondary | ICD-10-CM | POA: Insufficient documentation

## 2024-07-22 DIAGNOSIS — M545 Low back pain, unspecified: Secondary | ICD-10-CM | POA: Insufficient documentation

## 2024-07-22 DIAGNOSIS — M25552 Pain in left hip: Secondary | ICD-10-CM | POA: Diagnosis not present

## 2024-07-22 DIAGNOSIS — G8929 Other chronic pain: Secondary | ICD-10-CM | POA: Insufficient documentation

## 2024-07-22 DIAGNOSIS — G894 Chronic pain syndrome: Secondary | ICD-10-CM | POA: Diagnosis not present

## 2024-07-22 MED ORDER — OXYCODONE-ACETAMINOPHEN 10-325 MG PO TABS: 1.0000 | ORAL_TABLET | ORAL | 0 refills | Status: DC | PRN

## 2024-07-22 NOTE — Progress Notes (Unsigned)
 Subjective:    Patient ID: Philip Bates, male    DOB: [birth year], 63 y.o.   MRN: [id number]  HPI: Philip Bates is a 63 y.o. male who returns for follow up appointment for chronic pain and medication refill. states *** pain is located in  ***. rates pain ***. current exercise regime is walking and performing stretching exercises.  Mr. Mcpeek Morphine  equivalent is *** MME.   Last UDS was Performed on 04/27/2024, it was consistent.    Pain Inventory Average Pain 9 Pain Right Now 8 My pain is sharp, burning, dull, stabbing, tingling, aching, and throbbing  In the last 24 hours, has pain interfered with the following? General activity 6 Relation with others 2 Enjoyment of life 7 What TIME of day is your pain at its worst? morning , daytime, and evening Sleep (in general) Poor  Pain is worse with: walking, bending, sitting, inactivity, standing, and some activites Pain improves with: rest, heat/ice, and medication Relief from Meds: 9  Family History  Problem Relation Age of Onset   Esophageal cancer Father    Cancer Father        esophagus   Diabetes Brother    Diabetes Paternal Uncle    Colon cancer Neg Hx    Rectal cancer Neg Hx    Stomach cancer Neg Hx    Social History   Socioeconomic History   Marital status: Married    Spouse name: Not on file   Number of children: Not on file   Years of education: Not on file   Highest education level: Some college, no degree  Occupational History   Not on file  Tobacco Use   Smoking status: Every Day    Current packs/day: 1.00    Average packs/day: 1 pack/day for 35.0 years (35.0 ttl pk-yrs)    Types: Cigarettes   Smokeless tobacco: Never  Vaping Use   Vaping status: Never Used  Substance and Sexual Activity   Alcohol  use: Never   Drug use: Never   Sexual activity: Yes  Other Topics Concern   Not on file  Social History Narrative   ** Merged History Encounter **       Social Drivers of Health   Financial Resource  Strain: Low Risk  (06/15/2024)   Overall Financial Resource Strain (CARDIA)    Difficulty of Paying Living Expenses: Not hard at all  Recent Concern: Physicist, medical Strain - High Risk (04/26/2024)   Overall Financial Resource Strain (CARDIA)    Difficulty of Paying Living Expenses: Hard  Food Insecurity: No Food Insecurity (06/15/2024)   Hunger Vital Sign    Worried About Running Out of Food in the Last Year: Never true    Ran Out of Food in the Last Year: Never true  Recent Concern: Food Insecurity - Food Insecurity Present (06/07/2024)   Received from [facility]   Hunger Vital Sign    Within the past 12 months, you worried that your food would run out before you got the money to buy more.: Sometimes true    Within the past 12 months, the food you bought just didn't last and you didn't have money to get more.: Sometimes true  Transportation Needs: No Transportation Needs (06/15/2024)   PRAPARE - Administrator, Civil Service (Medical): No    Lack of Transportation (Non-Medical): No  Recent Concern: Transportation Needs - Unmet Transportation Needs (06/07/2024)   Received from [facility] - Transportation    In  the past 12 months, has lack of transportation kept you from medical appointments or from getting medications?: Yes    In the past 12 months, has lack of transportation kept you from meetings, work, or from getting things needed for daily living?: Yes  Physical Activity: Inactive (06/15/2024)   Exercise Vital Sign    Days of Exercise per Week: 0 days    Minutes of Exercise per Session: 0 min  Stress: No Stress Concern Present (06/15/2024)   [employer] of Occupational Health - Occupational Stress Questionnaire    Feeling of Stress: Not at all  Social Connections: Moderately Isolated (06/15/2024)   Social Connection and Isolation Panel    Frequency of Communication with Friends and Family: More than three times a week    Frequency of Social Gatherings  with Friends and Family: More than three times a week    Attends Religious Services: Never    Database administrator or Organizations: No    Attends Engineer, structural: Never    Marital Status: Married   Past Surgical History:  Procedure Laterality Date   ANTERIOR CERVICAL DECOMPRESSION/DISCECTOMY FUSION 4 LEVELS N/A 08/11/2017   Procedure: Cervical three to Cervical seven Anterior Cervical discectomy with fusion/plate fixation;  Surgeon: Ditty, Morene Hicks, MD;  Location: [facility] OR;  Service: Neurosurgery;  Laterality: N/A;   BACK SURGERY     HERNIA REPAIR     inguinal hernia repair in early 2000   LEG SURGERY     POSTERIOR CERVICAL FUSION/FORAMINOTOMY N/A 08/12/2018   Procedure: POSTERIOR CERVICAL FUSION WITH LATERAL MASS FIXATION CERVICAL THREE TO CERVICAL SEVEN;  Surgeon: Lanis Pupa, MD;  Location: [facility] OR;  Service: Neurosurgery;  Laterality: N/A;   SHOULDER ARTHROSCOPY WITH ROTATOR CUFF REPAIR AND SUBACROMIAL DECOMPRESSION Right 02/02/2018   Procedure: RIGHT SHOULDER ARTHROSCOPY WITH SUBACROMIAL DECOMPRESSION, MINI-OPEN ROTATOR CUFF REPAIR;  Surgeon: Addie Cordella Hamilton, MD;  Location: [facility] OR;  Service: Orthopedics;  Laterality: Right;   SHOULDER SURGERY Right    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   Procedure: LEFT TOTAL HIP ARTHROPLASTY ANTERIOR APPROACH;  Surgeon: Jerri Kay HERO, MD;  Location: [facility] OR;  Service: Orthopedics;  Laterality: Left;   Past Surgical History:  Procedure Laterality Date   ANTERIOR CERVICAL DECOMPRESSION/DISCECTOMY FUSION 4 LEVELS N/A 08/11/2017   Procedure: Cervical three to Cervical seven Anterior Cervical discectomy with fusion/plate fixation;  Surgeon: Ditty, Morene Hicks, MD;  Location: [facility] OR;  Service: Neurosurgery;  Laterality: N/A;   BACK SURGERY     HERNIA REPAIR     inguinal hernia repair in early 2000   LEG SURGERY     POSTERIOR CERVICAL FUSION/FORAMINOTOMY N/A 08/12/2018   Procedure: POSTERIOR CERVICAL FUSION WITH LATERAL MASS  FIXATION CERVICAL THREE TO CERVICAL SEVEN;  Surgeon: Lanis Pupa, MD;  Location: [facility] OR;  Service: Neurosurgery;  Laterality: N/A;   SHOULDER ARTHROSCOPY WITH ROTATOR CUFF REPAIR AND SUBACROMIAL DECOMPRESSION Right 02/02/2018   Procedure: RIGHT SHOULDER ARTHROSCOPY WITH SUBACROMIAL DECOMPRESSION, MINI-OPEN ROTATOR CUFF REPAIR;  Surgeon: Addie Cordella Hamilton, MD;  Location: [facility] OR;  Service: Orthopedics;  Laterality: Right;   SHOULDER SURGERY Right    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   Procedure: LEFT TOTAL HIP ARTHROPLASTY ANTERIOR APPROACH;  Surgeon: Jerri Kay HERO, MD;  Location: [facility] OR;  Service: Orthopedics;  Laterality: Left;   Past Medical History:  Diagnosis Date   Arthritis    Cerebral palsy (HCC)    Cervical radiculopathy    H/O umbilical hernia repair 2001   Pre-diabetes  Scoliosis    Spondylosis of cervical spine    BP (!) 149/87 (BP Location: Left Arm, Patient Position: Sitting, Cuff Size: Normal)   Pulse 75   Ht 5' 4 (1.626 m)   Wt 124 lb 3.2 oz (56.3 kg)   SpO2 99%   BMI 21.32 kg/m   Opioid Risk Score:   Fall Risk Score:  `1  Depression screen [facility] 2/9     06/22/2024    9:41 AM 06/15/2024   10:42 AM 04/27/2024   10:24 AM 03/29/2024    1:21 PM 03/03/2024    9:52 AM 12/02/2023    1:45 PM 11/05/2023   10:58 AM  Depression screen PHQ 2/9  Decreased Interest 3 0 0  1 0 0  Down, Depressed, Hopeless 2 0 0 0 1 0 0  PHQ - 2 Score 5 0 0 0 2 0 0  Altered sleeping 3  0 0 1    Tired, decreased energy 0  0 0 1    Change in appetite 2  0 0 0    Feeling bad or failure about yourself  1  0 0 0    Trouble concentrating 0  0 0 0    Moving slowly or fidgety/restless 0  0 0 0    Suicidal thoughts 0  0 0 0    PHQ-9 Score 11  0 0 4    Difficult doing work/chores Somewhat difficult   Somewhat difficult Somewhat difficult        Review of Systems  Musculoskeletal:  Positive for arthralgias, back pain, myalgias and neck pain.       Neck pain, back, shoulder pain, left  hip pain  All other systems reviewed and are negative.      Objective:   Physical Exam        Assessment & Plan:  Cervicalgia/ Cervical Radiculitis: No complaints today. Continue HEP as Tolerated. Continue to Monitor. Continue Gabapentin . Continue to Monitor. 06/22/2024 Chronic Bilateral;  Shoulder Pain:  Continue HEP as Tolerated. Continue to Monitor. Ortho Following. 06/22/2024 Failed Back Syndrome: Continue HEP as Tolerated. Continue to Monitor. 06/22/2024 Chronic Bilateral Low Back Pain without Sciatica: Continue HEP as Tolerated. Continue current medication regimen. Continue to Monitor. 06/22/2024 Chronic Left Hip Pain: S/P On 09/10/2020: Dr Jerri LEFT TOTAL HIP ARTHROPLASTY ANTERIOR APPROACH Continue HEP as Tolerated. Continue current medication regimen. Continue to Monitor. 06/22/2024 Chronic Left knee Pain: Continue HEP as Tolerated. Continue to Monitor. No complaints today. Continue to Monitor. 06/22/2024 Left Foot Drop: Wearing AFO: [employer] Following. Continue to Monitor. 04/27/2024 Chronic Pain Syndrome:  Continue Xtampza  9 mg every 12 hours. Refilled Oxycodone  10/325 mg one tablet every 4 hours as needed for pain #180.  We will continue the opioid monitoring program, this consists of regular clinic visits, examinations, urine drug screen, pill counts as well as use of [place]  Controlled Substance Reporting system. A 12 month History has been reviewed on the [place]  Controlled Substance Reporting System on 06/22/2024   F/U in 1 month

## 2024-07-22 NOTE — Telephone Encounter (Signed)
 Patients pharmacy needs a call from [place] to fill medication early.

## 2024-07-22 NOTE — Telephone Encounter (Signed)
 [facility] Called: Spoke  with Delon, the pharmacist  she states she will let the pharmacy who is working tomorrow, be aware of the permission for early refill. Call placed to Mr. Schaible regarding the above. He verbalizes understanding.

## 2024-08-01 ENCOUNTER — Ambulatory Visit

## 2024-08-04 ENCOUNTER — Telehealth: Payer: Self-pay

## 2024-08-04 NOTE — Telephone Encounter (Signed)
 Per pharmacy Philip Bates need a new Rx for Gabapentin  400 MG sent. He cancelled it recently now he needs it back.

## 2024-08-05 ENCOUNTER — Other Ambulatory Visit: Payer: Self-pay

## 2024-08-05 DIAGNOSIS — K219 Gastro-esophageal reflux disease without esophagitis: Secondary | ICD-10-CM

## 2024-08-05 MED ORDER — GABAPENTIN 400 MG PO CAPS: 400.0000 mg | ORAL_CAPSULE | Freq: Three times a day (TID) | ORAL | 3 refills | Status: DC

## 2024-08-05 MED ORDER — OMEPRAZOLE 40 MG PO CPDR: 40.0000 mg | DELAYED_RELEASE_CAPSULE | Freq: Every day | ORAL | 0 refills | Status: AC

## 2024-08-05 NOTE — Telephone Encounter (Signed)
 PDMP was Reviewed.  Gabapentin  e-scribed.  Call placed to [name], no answer.

## 2024-08-08 ENCOUNTER — Encounter: Payer: Self-pay | Admitting: Radiology

## 2024-08-22 ENCOUNTER — Encounter: Admitting: Registered Nurse

## 2024-08-23 ENCOUNTER — Encounter: Attending: Physical Medicine and Rehabilitation | Admitting: Registered Nurse

## 2024-08-23 ENCOUNTER — Encounter: Payer: Self-pay | Admitting: Registered Nurse

## 2024-08-23 VITALS — BP 142/93 | HR 68 | Ht 64.0 in | Wt 118.8 lb

## 2024-08-23 DIAGNOSIS — M25511 Pain in right shoulder: Secondary | ICD-10-CM | POA: Insufficient documentation

## 2024-08-23 DIAGNOSIS — G894 Chronic pain syndrome: Secondary | ICD-10-CM | POA: Insufficient documentation

## 2024-08-23 DIAGNOSIS — M542 Cervicalgia: Secondary | ICD-10-CM | POA: Insufficient documentation

## 2024-08-23 DIAGNOSIS — M25512 Pain in left shoulder: Secondary | ICD-10-CM | POA: Diagnosis present

## 2024-08-23 DIAGNOSIS — Z5181 Encounter for therapeutic drug level monitoring: Secondary | ICD-10-CM | POA: Insufficient documentation

## 2024-08-23 DIAGNOSIS — Z79891 Long term (current) use of opiate analgesic: Secondary | ICD-10-CM | POA: Insufficient documentation

## 2024-08-23 DIAGNOSIS — M961 Postlaminectomy syndrome, not elsewhere classified: Secondary | ICD-10-CM | POA: Diagnosis present

## 2024-08-23 DIAGNOSIS — M25552 Pain in left hip: Secondary | ICD-10-CM | POA: Insufficient documentation

## 2024-08-23 DIAGNOSIS — M545 Low back pain, unspecified: Secondary | ICD-10-CM | POA: Diagnosis present

## 2024-08-23 DIAGNOSIS — G8929 Other chronic pain: Secondary | ICD-10-CM | POA: Diagnosis present

## 2024-08-23 MED ORDER — OXYCODONE-ACETAMINOPHEN 10-325 MG PO TABS: 1.0000 | ORAL_TABLET | ORAL | 0 refills | Status: DC | PRN

## 2024-08-23 NOTE — Progress Notes (Unsigned)
 Subjective:    Patient ID: Philip Bates, male    DOB: [birth year], 63 y.o.   MRN: [id number]  HPI: Philip Bates is a 63 y.o. male who returns for follow up appointment for chronic pain and medication refill. He states his pain is located in his  neck radiating into his left shoulder., also reports right shoulder pain,  lower back pain, left hip and left knee pain. She rates her pain 7. Her current exercise regime is walking and performing stretching exercises.  [name] Morphine  equivalent is 90.00 MME.   Oral Swab was Performed today.    Pain Inventory Average Pain 8 Pain Right Now 7 My pain is sharp, burning, dull, stabbing, tingling, and aching  In the last 24 hours, has pain interfered with the following? General activity 7 Relation with others 7 Enjoyment of life 7 What TIME of day is your pain at its worst? morning , daytime, evening, and night Sleep (in general) Poor  Pain is worse with: walking, bending, sitting, inactivity, standing, unsure, and some activites Pain improves with: rest, heat/ice, and medication Relief from Meds: 7  Family History  Problem Relation Age of Onset   Esophageal cancer Father    Cancer Father        esophagus   Diabetes Brother    Diabetes Paternal Uncle    Colon cancer Neg Hx    Rectal cancer Neg Hx    Stomach cancer Neg Hx    Social History   Socioeconomic History   Marital status: Married    Spouse name: Not on file   Number of children: Not on file   Years of education: Not on file   Highest education level: Some college, no degree  Occupational History   Not on file  Tobacco Use   Smoking status: Every Day    Current packs/day: 1.00    Average packs/day: 1 pack/day for 35.0 years (35.0 ttl pk-yrs)    Types: Cigarettes   Smokeless tobacco: Never  Vaping Use   Vaping status: Never Used  Substance and Sexual Activity   Alcohol  use: Never   Drug use: Never   Sexual activity: Yes  Other Topics Concern   Not on file   Social History Narrative   ** Merged History Encounter **       Social Drivers of Health   Financial Resource Strain: Low Risk  (06/15/2024)   Overall Financial Resource Strain (CARDIA)    Difficulty of Paying Living Expenses: Not hard at all  Recent Concern: Physicist, Medical Strain - High Risk (04/26/2024)   Overall Financial Resource Strain (CARDIA)    Difficulty of Paying Living Expenses: Hard  Food Insecurity: No Food Insecurity (06/15/2024)   Hunger Vital Sign    Worried About Running Out of Food in the Last Year: Never true    Ran Out of Food in the Last Year: Never true  Recent Concern: Food Insecurity - Food Insecurity Present (06/07/2024)   Received from [facility]   Hunger Vital Sign    Within the past 12 months, you worried that your food would run out before you got the money to buy more.: Sometimes true    Within the past 12 months, the food you bought just didn't last and you didn't have money to get more.: Sometimes true  Transportation Needs: No Transportation Needs (06/15/2024)   PRAPARE - Administrator, Civil Service (Medical): No    Lack of Transportation (Non-Medical): No  Recent Concern:  Transportation Needs - Unmet Transportation Needs (06/07/2024)   Received from [facility] - Transportation    In the past 12 months, has lack of transportation kept you from medical appointments or from getting medications?: Yes    In the past 12 months, has lack of transportation kept you from meetings, work, or from getting things needed for daily living?: Yes  Physical Activity: Inactive (06/15/2024)   Exercise Vital Sign    Days of Exercise per Week: 0 days    Minutes of Exercise per Session: 0 min  Stress: No Stress Concern Present (06/15/2024)   [employer] of Occupational Health - Occupational Stress Questionnaire    Feeling of Stress: Not at all  Social Connections: Moderately Isolated (06/15/2024)   Social Connection and Isolation Panel     Frequency of Communication with Friends and Family: More than three times a week    Frequency of Social Gatherings with Friends and Family: More than three times a week    Attends Religious Services: Never    Database Administrator or Organizations: No    Attends Engineer, Structural: Never    Marital Status: Married   Past Surgical History:  Procedure Laterality Date   ANTERIOR CERVICAL DECOMPRESSION/DISCECTOMY FUSION 4 LEVELS N/A 08/11/2017   Procedure: Cervical three to Cervical seven Anterior Cervical discectomy with fusion/plate fixation;  Surgeon: Ditty, Morene Hicks, MD;  Location: [facility] OR;  Service: Neurosurgery;  Laterality: N/A;   BACK SURGERY     HERNIA REPAIR     inguinal hernia repair in early 2000   LEG SURGERY     POSTERIOR CERVICAL FUSION/FORAMINOTOMY N/A 08/12/2018   Procedure: POSTERIOR CERVICAL FUSION WITH LATERAL MASS FIXATION CERVICAL THREE TO CERVICAL SEVEN;  Surgeon: Lanis Pupa, MD;  Location: [facility] OR;  Service: Neurosurgery;  Laterality: N/A;   SHOULDER ARTHROSCOPY WITH ROTATOR CUFF REPAIR AND SUBACROMIAL DECOMPRESSION Right 02/02/2018   Procedure: RIGHT SHOULDER ARTHROSCOPY WITH SUBACROMIAL DECOMPRESSION, MINI-OPEN ROTATOR CUFF REPAIR;  Surgeon: Addie Cordella Hamilton, MD;  Location: [facility] OR;  Service: Orthopedics;  Laterality: Right;   SHOULDER SURGERY Right    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   Procedure: LEFT TOTAL HIP ARTHROPLASTY ANTERIOR APPROACH;  Surgeon: Jerri Kay HERO, MD;  Location: [facility] OR;  Service: Orthopedics;  Laterality: Left;   Past Surgical History:  Procedure Laterality Date   ANTERIOR CERVICAL DECOMPRESSION/DISCECTOMY FUSION 4 LEVELS N/A 08/11/2017   Procedure: Cervical three to Cervical seven Anterior Cervical discectomy with fusion/plate fixation;  Surgeon: Ditty, Morene Hicks, MD;  Location: [facility] OR;  Service: Neurosurgery;  Laterality: N/A;   BACK SURGERY     HERNIA REPAIR     inguinal hernia repair in early 2000   LEG SURGERY      POSTERIOR CERVICAL FUSION/FORAMINOTOMY N/A 08/12/2018   Procedure: POSTERIOR CERVICAL FUSION WITH LATERAL MASS FIXATION CERVICAL THREE TO CERVICAL SEVEN;  Surgeon: Lanis Pupa, MD;  Location: [facility] OR;  Service: Neurosurgery;  Laterality: N/A;   SHOULDER ARTHROSCOPY WITH ROTATOR CUFF REPAIR AND SUBACROMIAL DECOMPRESSION Right 02/02/2018   Procedure: RIGHT SHOULDER ARTHROSCOPY WITH SUBACROMIAL DECOMPRESSION, MINI-OPEN ROTATOR CUFF REPAIR;  Surgeon: Addie Cordella Hamilton, MD;  [facility] OR;  Service: Orthopedics;  Laterality: Right;   SHOULDER SURGERY Right    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   Procedure: LEFT TOTAL HIP ARTHROPLASTY ANTERIOR APPROACH;  Surgeon: Jerri Kay HERO, MD;  Location: [facility] OR;  Service: Orthopedics;  Laterality: Left;   Past Medical History:  Diagnosis Date   Arthritis  Cerebral palsy (HCC)    Cervical radiculopathy    H/O umbilical hernia repair 2001   Pre-diabetes    Scoliosis    Spondylosis of cervical spine    BP (!) 142/93 (BP Location: Left Arm, Patient Position: Sitting)   Pulse 68   Ht 5' 4 (1.626 m)   Wt 118 lb 12.8 oz (53.9 kg)   SpO2 99%   BMI 20.39 kg/m   Opioid Risk Score:   Fall Risk Score:  `1  Depression screen [facility] 2/9     06/22/2024    9:41 AM 06/15/2024   10:42 AM 04/27/2024   10:24 AM 03/29/2024    1:21 PM 03/03/2024    9:52 AM 12/02/2023    1:45 PM 11/05/2023   10:58 AM  Depression screen PHQ 2/9  Decreased Interest 3 0 0  1 0 0  Down, Depressed, Hopeless 2 0 0 0 1 0 0  PHQ - 2 Score 5 0 0 0 2 0 0  Altered sleeping 3  0 0 1    Tired, decreased energy 0  0 0 1    Change in appetite 2  0 0 0    Feeling bad or failure about yourself  1  0 0 0    Trouble concentrating 0  0 0 0    Moving slowly or fidgety/restless 0  0 0 0    Suicidal thoughts 0  0 0 0    PHQ-9 Score 11   0  0  4     Difficult doing work/chores Somewhat difficult   Somewhat difficult Somewhat difficult       Data saved with a previous flowsheet row  definition      Review of Systems  Musculoskeletal:  Positive for arthralgias and back pain.       Bilateral shoulder pain, left hip, left knee and lower leg pain       Objective:   Physical Exam Vitals and nursing note reviewed.  Constitutional:      Appearance: Normal appearance.  Cardiovascular:     Rate and Rhythm: Normal rate and regular rhythm.     Pulses: Normal pulses.     Heart sounds: Normal heart sounds.  Pulmonary:     Effort: Pulmonary effort is normal.     Breath sounds: Normal breath sounds.  Musculoskeletal:     Comments: Normal Muscle Bulk and Muscle Testing Reveals:  Upper Extremities: Full ROM and Muscle Strength 5/5 Bilateral AC Joint Tenderness: L>R Lumbar Paraspinal Tenderness: L-3-L-5 Left Greater Trochanter Tenderness Lower Extremities: Right: Full ROM and Muscle Strength 5/5 Left Lower Extremity: Decreased ROM and Muscle Strength 5/5 Left Lower Extremity Flexion Produces Pain into his Left Patella Arises from Table slowly using cane for support Antalgic  Gait     Skin:    General: Skin is warm and dry.  Neurological:     Mental Status: He is alert and oriented to person, place, and time.  Psychiatric:        Mood and Affect: Mood normal.        Behavior: Behavior normal.          Assessment & Plan:  Cervicalgia/ Cervical Radiculitis: Continue HEP as Tolerated. Continue to Monitor. Continue Gabapentin . Continue to Monitor. 08/23/2024 Chronic Right  Shoulder Pain:  Continue HEP as Tolerated. Continue to Monitor. Ortho Following. 08/23/2024 Failed Back Syndrome: Continue HEP as Tolerated. Continue to Monitor. 08/23/2024 Chronic Bilateral Low Back Pain without Sciatica: Continue HEP as Tolerated. Continue current  medication regimen. Continue to Monitor. 08/23/2024 Chronic Left Hip Pain: S/P On 09/10/2020: Dr Jerri LEFT TOTAL HIP ARTHROPLASTY ANTERIOR APPROACH Continue HEP as Tolerated. Continue current medication regimen. Continue to Monitor.  08/23/2024 Chronic Left knee Pain: Continue HEP as Tolerated. Continue to Monitor.  Continue to Monitor. 08/23/2024 Left Foot Drop: He usually wears his AFO: [employer] Following. Continue to Monitor. 08/23/2024 Chronic Pain Syndrome: Refilled Oxycodone  10/325 mg one tablet every 4 hours as needed for pain #180.  We will continue the opioid monitoring program, this consists of regular clinic visits, examinations, urine drug screen, pill counts as well as use of [place]  Controlled Substance Reporting system. A 12 month History has been reviewed on the [place]  Controlled Substance Reporting System on 08/23/2024   F/U in 1 month

## 2024-08-26 LAB — DRUG TOX MONITOR 1 W/CONF, ORAL FLD
Amphetamines: NEGATIVE ng/mL (ref <10)
Barbiturates: NEGATIVE ng/mL (ref <10)
Benzodiazepines: NEGATIVE ng/mL (ref <0.50)
Buprenorphine: NEGATIVE ng/mL (ref <0.10)
Cocaine: NEGATIVE ng/mL (ref <5.0)
Codeine: NEGATIVE ng/mL (ref <2.5)
Cotinine: >250 ng/mL — ABNORMAL HIGH (ref <5.0)
Dihydrocodeine: NEGATIVE ng/mL (ref <2.5)
Fentanyl: NEGATIVE ng/mL (ref <0.10)
Heroin Metabolite: NEGATIVE ng/mL (ref <1.0)
Hydrocodone: NEGATIVE ng/mL (ref <2.5)
Hydromorphone: NEGATIVE ng/mL (ref <2.5)
MARIJUANA: NEGATIVE ng/mL (ref <2.5)
MDMA: NEGATIVE ng/mL (ref <10)
Meprobamate: NEGATIVE ng/mL (ref <2.5)
Methadone: NEGATIVE ng/mL (ref <5.0)
Morphine: NEGATIVE ng/mL (ref <2.5)
Nicotine Metabolite: POSITIVE ng/mL — AB (ref <5.0)
Norhydrocodone: NEGATIVE ng/mL (ref <2.5)
Noroxycodone: 6.1 ng/mL — ABNORMAL HIGH (ref <2.5)
Opiates: POSITIVE ng/mL — AB (ref <2.5)
Oxycodone: 60.9 ng/mL — ABNORMAL HIGH (ref <2.5)
Oxymorphone: NEGATIVE ng/mL (ref <2.5)
Phencyclidine: NEGATIVE ng/mL (ref <10)
Tapentadol: NEGATIVE ng/mL (ref <5.0)
Tramadol: NEGATIVE ng/mL (ref <5.0)
Zolpidem: NEGATIVE ng/mL (ref <5.0)

## 2024-08-26 LAB — DRUG TOX ALC METAB W/CON, ORAL FLD: Alcohol Metabolite: NEGATIVE ng/mL (ref <25)

## 2024-09-04 ENCOUNTER — Other Ambulatory Visit: Payer: Self-pay | Admitting: Registered Nurse

## 2024-09-21 ENCOUNTER — Encounter: Attending: Physical Medicine and Rehabilitation | Admitting: Registered Nurse

## 2024-09-21 VITALS — BP 132/87 | HR 68 | Ht 64.0 in | Wt 117.0 lb

## 2024-09-21 DIAGNOSIS — M25552 Pain in left hip: Secondary | ICD-10-CM

## 2024-09-21 DIAGNOSIS — M542 Cervicalgia: Secondary | ICD-10-CM

## 2024-09-21 DIAGNOSIS — M545 Low back pain, unspecified: Secondary | ICD-10-CM | POA: Diagnosis present

## 2024-09-21 DIAGNOSIS — G894 Chronic pain syndrome: Secondary | ICD-10-CM

## 2024-09-21 DIAGNOSIS — M961 Postlaminectomy syndrome, not elsewhere classified: Secondary | ICD-10-CM

## 2024-09-21 DIAGNOSIS — Z5181 Encounter for therapeutic drug level monitoring: Secondary | ICD-10-CM | POA: Diagnosis present

## 2024-09-21 DIAGNOSIS — M25512 Pain in left shoulder: Secondary | ICD-10-CM | POA: Diagnosis present

## 2024-09-21 DIAGNOSIS — Z79891 Long term (current) use of opiate analgesic: Secondary | ICD-10-CM

## 2024-09-21 DIAGNOSIS — M79672 Pain in left foot: Secondary | ICD-10-CM | POA: Diagnosis present

## 2024-09-21 DIAGNOSIS — M25511 Pain in right shoulder: Secondary | ICD-10-CM | POA: Diagnosis present

## 2024-09-21 DIAGNOSIS — G8929 Other chronic pain: Secondary | ICD-10-CM | POA: Diagnosis present

## 2024-09-21 MED ORDER — OXYCODONE-ACETAMINOPHEN 10-325 MG PO TABS: 1.0000 | ORAL_TABLET | ORAL | 0 refills | Status: DC | PRN

## 2024-09-21 NOTE — Progress Notes (Signed)
 "  Subjective:    Patient ID: Philip Bates, male    DOB: [birth year], 63 y.o.   MRN: [id number]  YEP:Philip Bates is a 63 y.o. male who returns for follow up appointment for chronic pain and medication refill. He states his pain is located in his bilateral shoulders R>L, lower back, left hip and left foot. He rates his pain 7. His current exercise regime is walking and performing stretching exercises.  Mr. Laymon Morphine  equivalent is 90.00 MME.   Last Oral Swab was Performed on 08/23/2024, it was consistent.     Pain Inventory Average Pain 8 Pain Right Now 7 My pain is sharp, burning, dull, stabbing, tingling, and aching  In the last 24 hours, has pain interfered with the following? General activity 5 Relation with others 2 Enjoyment of life 7 What TIME of day is your pain at its worst? varies Sleep (in general) Poor  Pain is worse with: walking, bending, sitting, inactivity, standing, unsure, and some activites Pain improves with: rest and medication Relief from Meds: 8  Family History  Problem Relation Age of Onset   Esophageal cancer Father    Cancer Father        esophagus   Diabetes Brother    Diabetes Paternal Uncle    Colon cancer Neg Hx    Rectal cancer Neg Hx    Stomach cancer Neg Hx    Social History   Socioeconomic History   Marital status: Married    Spouse name: Not on file   Number of children: Not on file   Years of education: Not on file   Highest education level: Some college, no degree  Occupational History   Not on file  Tobacco Use   Smoking status: Every Day    Current packs/day: 1.00    Average packs/day: 1 pack/day for 35.0 years (35.0 ttl pk-yrs)    Types: Cigarettes   Smokeless tobacco: Never  Vaping Use   Vaping status: Never Used  Substance and Sexual Activity   Alcohol  use: Never   Drug use: Never   Sexual activity: Yes  Other Topics Concern   Not on file  Social History Narrative   ** Merged History Encounter **       Social  Drivers of Health   Tobacco Use: High Risk (08/23/2024)   Patient History    Smoking Tobacco Use: Every Day    Smokeless Tobacco Use: Never    Passive Exposure: Not on file  Financial Resource Strain: Medium Risk (08/30/2024)   Received [facility]   Overall Financial Resource Strain (CARDIA)    Difficulty of Paying Living Expenses: Somewhat hard  Food Insecurity: No Food Insecurity (08/30/2024)   Received from [facility]   Epic    Within the past 12 months, you worried that your food would run out before you got the money to buy more.: Never true    Within the past 12 months, the food you bought just didn't last and you didn't have money to get more.: Never true  Recent Concern: Food Insecurity - Food Insecurity Present (06/07/2024)   Received from [facility]   Epic    Within the past 12 months, you worried that your food would run out before you got the money to buy more.: Sometimes true    Within the past 12 months, the food you bought just didn't last and you didn't have money to get more.: Sometimes true  Transportation Needs: No  Transportation Needs (08/30/2024)   Received from [facility] - Transportation    In the past 12 months, has lack of transportation kept you from medical appointments or from getting medications?: No    Lack of Transportation (Non-Medical): No  Recent Concern: Transportation Needs - Unmet Transportation Needs (06/07/2024)   Received from [facility]    In the past 12 months, has lack of transportation kept you from medical appointments or from getting medications?: Yes    In the past 12 months, has lack of transportation kept you from meetings, work, or from getting things needed for daily living?: Yes  Physical Activity: Inactive (06/15/2024)   Exercise Vital Sign    Days of Exercise per Week: 0 days    Minutes of Exercise per Session: 0 min  Stress: No Stress Concern  Present (06/15/2024)   [employer] of Occupational Health - Occupational Stress Questionnaire    Feeling of Stress: Not at all  Social Connections: Moderately Isolated (06/15/2024)   Social Connection and Isolation Panel    Frequency of Communication with Friends and Family: More than three times a week    Frequency of Social Gatherings with Friends and Family: More than three times a week    Attends Religious Services: Never    Database Administrator or Organizations: No    Attends Banker Meetings: Never    Marital Status: Married  Depression (PHQ2-9): High Risk (06/22/2024)   Depression (PHQ2-9)    PHQ-2 Score: 11  Alcohol  Screen: Low Risk (06/15/2024)   Alcohol  Screen    Last Alcohol  Screening Score (AUDIT): 0  Housing: Unknown (08/30/2024)   Received from [facility]   Epic    In the last 12 months, was there a time when you were not able to pay the mortgage or rent on time?: No    Number of Times Moved in the Last Year: Not on file    At any time in the past 12 months, were you homeless or living in a shelter (including now)?: No  Utilities: Not At Risk (08/30/2024)   Received from [facility]   Epic    In the past 12 months has the electric, gas, oil, or water  company threatened to shut off services in your home?: No  Health Literacy: Adequate Health Literacy (06/15/2024)   B1300 Health Literacy    Frequency of need for help with medical instructions: Never   Past Surgical History:  Procedure Laterality Date   ANTERIOR CERVICAL DECOMPRESSION/DISCECTOMY FUSION 4 LEVELS N/A 08/11/2017   Procedure: Cervical three to Cervical seven Anterior Cervical discectomy with fusion/plate fixation;  Surgeon: Ditty, Morene Hicks, MD;  Location: [facility] OR;  Service: Neurosurgery;  Laterality: N/A;   BACK SURGERY     HERNIA REPAIR     inguinal hernia repair in early 2000   LEG SURGERY     POSTERIOR CERVICAL FUSION/FORAMINOTOMY N/A 08/12/2018    Procedure: POSTERIOR CERVICAL FUSION WITH LATERAL MASS FIXATION CERVICAL THREE TO CERVICAL SEVEN;  Surgeon: Lanis Pupa, MD;  Location: [facility] OR;  Service: Neurosurgery;  Laterality: N/A;   SHOULDER ARTHROSCOPY WITH ROTATOR CUFF REPAIR AND SUBACROMIAL DECOMPRESSION Right 02/02/2018   Procedure: RIGHT SHOULDER ARTHROSCOPY WITH SUBACROMIAL DECOMPRESSION, MINI-OPEN ROTATOR CUFF REPAIR;  Surgeon: Addie Cordella Hamilton, MD;  Location: [facility] OR;  Service: Orthopedics;  Laterality: Right;   SHOULDER SURGERY Right    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   Procedure: LEFT TOTAL  HIP ARTHROPLASTY ANTERIOR APPROACH;  Surgeon: Jerri Kay HERO, MD;  Location: [facility] OR;  Service: Orthopedics;  Laterality: Left;   Past Surgical History:  Procedure Laterality Date   ANTERIOR CERVICAL DECOMPRESSION/DISCECTOMY FUSION 4 LEVELS N/A 08/11/2017   Procedure: Cervical three to Cervical seven Anterior Cervical discectomy with fusion/plate fixation;  Surgeon: Ditty, Morene Hicks, MD;  Location: [facility] OR;  Service: Neurosurgery;  Laterality: N/A;   BACK SURGERY     HERNIA REPAIR     inguinal hernia repair in early 2000   LEG SURGERY     POSTERIOR CERVICAL FUSION/FORAMINOTOMY N/A 08/12/2018   Procedure: POSTERIOR CERVICAL FUSION WITH LATERAL MASS FIXATION CERVICAL THREE TO CERVICAL SEVEN;  Surgeon: Lanis Pupa, MD;  Location: [facility] OR;  Service: Neurosurgery;  Laterality: N/A;   SHOULDER ARTHROSCOPY WITH ROTATOR CUFF REPAIR AND SUBACROMIAL DECOMPRESSION Right 02/02/2018   Procedure: RIGHT SHOULDER ARTHROSCOPY WITH SUBACROMIAL DECOMPRESSION, MINI-OPEN ROTATOR CUFF REPAIR;  Surgeon: Addie Cordella Hamilton, MD;  Location: [facility] OR;  Service: Orthopedics;  Laterality: Right;   SHOULDER SURGERY Right    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   Procedure: LEFT TOTAL HIP ARTHROPLASTY ANTERIOR APPROACH;  Surgeon: Jerri Kay HERO, MD;  Location: [facility] OR;  Service: Orthopedics;  Laterality: Left;   Past Medical History:  Diagnosis Date    Arthritis    Cerebral palsy (HCC)    Cervical radiculopathy    H/O umbilical hernia repair 2001   Pre-diabetes    Scoliosis    Spondylosis of cervical spine    BP 132/87   Pulse 68   Ht 5' 4 (1.626 m)   Wt 117 lb (53.1 kg)   SpO2 99%   BMI 20.08 kg/m   Opioid Risk Score:   Fall Risk Score:  `1  Depression screen [facility] 2/9     06/22/2024    9:41 AM 06/15/2024   10:42 AM 04/27/2024   10:24 AM 03/29/2024    1:21 PM 03/03/2024    9:52 AM 12/02/2023    1:45 PM 11/05/2023   10:58 AM  Depression screen PHQ 2/9  Decreased Interest 3 0 0  1 0 0  Down, Depressed, Hopeless 2 0 0 0 1 0 0  PHQ - 2 Score 5 0 0 0 2 0 0  Altered sleeping 3  0 0 1    Tired, decreased energy 0  0 0 1    Change in appetite 2  0 0 0    Feeling bad or failure about yourself  1  0 0 0    Trouble concentrating 0  0 0 0    Moving slowly or fidgety/restless 0  0 0 0    Suicidal thoughts 0  0 0 0    PHQ-9 Score 11   0  0  4     Difficult doing work/chores Somewhat difficult   Somewhat difficult Somewhat difficult       Data saved with a previous flowsheet row definition     Review of Systems  Musculoskeletal:  Positive for back pain.       B/L shoulder pain Left hip pain Left foot pain  All other systems reviewed and are negative.      Objective:   Physical Exam Vitals and nursing note reviewed.  Constitutional:      Appearance: Normal appearance.  Cardiovascular:     Rate and Rhythm: Normal rate and regular rhythm.     Pulses: Normal pulses.     Heart sounds: Normal heart sounds.  Pulmonary:  Effort: Pulmonary effort is normal.     Breath sounds: Normal breath sounds.  Musculoskeletal:     Comments: Normal Muscle Bulk and Muscle Testing Reveals:  Upper Extremities:Full  ROM and Muscle Strength 5/5 Bilateral AC Joint Tenderness  Lumbar Paraspinal Tenderness: L-4-L-5 Left Greater Trochanter Tenderness Lower Extremities: Full ROM and Muscle Strength 5/5 Left Lower Extremity Flexion Produces  pain into his Left Patella Arises from Table slowly using cane for support Antalgic  Gait     Skin:    General: Skin is warm and dry.  Neurological:     Mental Status: He is alert and oriented to person, place, and time.  Psychiatric:        Mood and Affect: Mood normal.        Behavior: Behavior normal.          Assessment & Plan:  Cervicalgia/ Cervical Radiculitis: Continue HEP as Tolerated. Continue to Monitor. Continue Gabapentin . Continue to Monitor. 09/21/2024 Chronic Right  Shoulder Pain:  Continue HEP as Tolerated. Continue to Monitor. Ortho Following. 09/21/2024 Failed Back Syndrome: Continue HEP as Tolerated. Continue to Monitor. 09/21/2024 Chronic Bilateral Low Back Pain without Sciatica: Continue HEP as Tolerated. Continue current medication regimen. Continue to Monitor. 09/21/2024 Chronic Left Hip Pain: S/P On 09/10/2020: Dr Jerri LEFT TOTAL HIP ARTHROPLASTY ANTERIOR APPROACH Continue HEP as Tolerated. Continue current medication regimen. Continue to Monitor. 09/21/2024 Chronic Left knee Pain: Continue HEP as Tolerated. Continue to Monitor.  Continue to Monitor. 09/21/2024 Left Foot Drop: He usually wears his AFO: [employer] Following. Continue to Monitor. 09/21/2024 Chronic Pain Syndrome: Refilled Oxycodone  10/325 mg one tablet every 4 hours as needed for pain #180.  We will continue the opioid monitoring program, this consists of regular clinic visits, examinations, urine drug screen, pill counts as well as use of [place]  Controlled Substance Reporting system. A 12 month History has been reviewed on the [place]  Controlled Substance Reporting System on 09/21/2024   F/U in 1 month   "

## 2024-10-01 ENCOUNTER — Encounter: Payer: Self-pay | Admitting: Registered Nurse

## 2024-10-18 ENCOUNTER — Encounter: Payer: Self-pay | Admitting: Registered Nurse

## 2024-10-18 ENCOUNTER — Encounter: Attending: Physical Medicine and Rehabilitation | Admitting: Registered Nurse

## 2024-10-18 VITALS — BP 126/82 | HR 72 | Ht 64.0 in | Wt 121.8 lb

## 2024-10-18 DIAGNOSIS — Z5181 Encounter for therapeutic drug level monitoring: Secondary | ICD-10-CM | POA: Diagnosis not present

## 2024-10-18 DIAGNOSIS — M25562 Pain in left knee: Secondary | ICD-10-CM | POA: Diagnosis not present

## 2024-10-18 DIAGNOSIS — M25512 Pain in left shoulder: Secondary | ICD-10-CM | POA: Insufficient documentation

## 2024-10-18 DIAGNOSIS — M545 Low back pain, unspecified: Secondary | ICD-10-CM | POA: Insufficient documentation

## 2024-10-18 DIAGNOSIS — M25511 Pain in right shoulder: Secondary | ICD-10-CM | POA: Diagnosis not present

## 2024-10-18 DIAGNOSIS — G894 Chronic pain syndrome: Secondary | ICD-10-CM | POA: Diagnosis not present

## 2024-10-18 DIAGNOSIS — M961 Postlaminectomy syndrome, not elsewhere classified: Secondary | ICD-10-CM | POA: Insufficient documentation

## 2024-10-18 DIAGNOSIS — M5412 Radiculopathy, cervical region: Secondary | ICD-10-CM | POA: Insufficient documentation

## 2024-10-18 DIAGNOSIS — M542 Cervicalgia: Secondary | ICD-10-CM | POA: Insufficient documentation

## 2024-10-18 DIAGNOSIS — Z79891 Long term (current) use of opiate analgesic: Secondary | ICD-10-CM | POA: Insufficient documentation

## 2024-10-18 DIAGNOSIS — G8929 Other chronic pain: Secondary | ICD-10-CM | POA: Diagnosis present

## 2024-10-18 DIAGNOSIS — M25552 Pain in left hip: Secondary | ICD-10-CM | POA: Diagnosis not present

## 2024-10-18 MED ORDER — OXYCODONE-ACETAMINOPHEN 10-325 MG PO TABS: 1.0000 | ORAL_TABLET | ORAL | 0 refills | Status: AC | PRN

## 2024-10-18 NOTE — Progress Notes (Signed)
 "  Subjective:    Patient ID: Philip Bates, male    DOB: [birth year], 64 y.o.   MRN: [id number]  HPI: Philip Bates is a 64 y.o. male who returns for follow up appointment for chronic pain and medication refill. He states his pain is located in his neck radiating into his left shoulder, he reports he will call his neurosurgeon to schedule an appointment. He also reports right shoulder pain lower back, left hip pain and left knee pain. He rates his pain 8. His current exercise regime is walking and performing stretching exercises.  Mr. Rodda Morphine  equivalent is 90.00 MME.   Last Oral Swab was Performed on 08/23/2024, it was consistent.    Pain Inventory Average Pain 8 Pain Right Now N/A My pain is sharp, burning, dull, stabbing, tingling, and aching  In the last 24 hours, has pain interfered with the following? General activity 7 Relation with others 6 Enjoyment of life 6 What TIME of day is your pain at its worst? evening Sleep (in general) Poor  Pain is worse with: walking, bending, sitting, inactivity, standing, and some activites Pain improves with: rest and heat/ice Relief from Meds: 8  Family History  Problem Relation Age of Onset   Esophageal cancer Father    Cancer Father        esophagus   Diabetes Brother    Diabetes Paternal Uncle    Colon cancer Neg Hx    Rectal cancer Neg Hx    Stomach cancer Neg Hx    Social History   Socioeconomic History   Marital status: Married    Spouse name: Not on file   Number of children: Not on file   Years of education: Not on file   Highest education level: Some college, no degree  Occupational History   Not on file  Tobacco Use   Smoking status: Every Day    Current packs/day: 1.00    Average packs/day: 1 pack/day for 35.0 years (35.0 ttl pk-yrs)    Types: Cigarettes   Smokeless tobacco: Never  Vaping Use   Vaping status: Never Used  Substance and Sexual Activity   Alcohol  use: Never   Drug use: Never   Sexual activity:  Yes  Other Topics Concern   Not on file  Social History Narrative   ** Merged History Encounter **       Social Drivers of Health   Tobacco Use: High Risk (10/18/2024)   Patient History    Smoking Tobacco Use: Every Day    Smokeless Tobacco Use: Never    Passive Exposure: Not on file  Financial Resource Strain: Medium Risk (08/30/2024)   Received from [facility]   Overall Financial Resource Strain (CARDIA)    Difficulty of Paying Living Expenses: Somewhat hard  Food Insecurity: No Food Insecurity (08/30/2024)   Received from [facility]   Epic    Within the past 12 months, you worried that your food would run out before you got the money to buy more.: Never true    Within the past 12 months, the food you bought just didn't last and you didn't have money to get more.: Never true  Recent Concern: Food Insecurity - Food Insecurity Present (06/07/2024)   Received from [facility]   Epic    Within the past 12 months, you worried that your food would run out before you got the money to buy more.: Sometimes true    Within the past 12 months,  the food you bought just didn't last and you didn't have money to get more.: Sometimes true  Transportation Needs: No Transportation Needs (08/30/2024)   Received from [facility] - Transportation    In the past 12 months, has lack of transportation kept you from medical appointments or from getting medications?: No    Lack of Transportation (Non-Medical): No  Recent Concern: Transportation Needs - Unmet Transportation Needs (06/07/2024)   Received from [facility]    In the past 12 months, has lack of transportation kept you from medical appointments or from getting medications?: Yes    In the past 12 months, has lack of transportation kept you from meetings, work, or from getting things needed for daily living?: Yes  Physical Activity: Inactive (06/15/2024)   Exercise Vital  Sign    Days of Exercise per Week: 0 days    Minutes of Exercise per Session: 0 min  Stress: No Stress Concern Present (06/15/2024)   [employer] of Occupational Health - Occupational Stress Questionnaire    Feeling of Stress: Not at all  Social Connections: Moderately Isolated (06/15/2024)   Social Connection and Isolation Panel    Frequency of Communication with Friends and Family: More than three times a week    Frequency of Social Gatherings with Friends and Family: More than three times a week    Attends Religious Services: Never    Database Administrator or Organizations: No    Attends Banker Meetings: Never    Marital Status: Married  Depression (PHQ2-9): High Risk (06/22/2024)   Depression (PHQ2-9)    PHQ-2 Score: 11  Alcohol  Screen: Low Risk (06/15/2024)   Alcohol  Screen    Last Alcohol  Screening Score (AUDIT): 0  Housing: Unknown (08/30/2024)   Received from [facility]   Epic    In the last 12 months, was there a time when you were not able to pay the mortgage or rent on time?: No    Number of Times Moved in the Last Year: Not on file    At any time in the past 12 months, were you homeless or living in a shelter (including now)?: No  Utilities: Not At Risk (08/30/2024)   Received from [facility]   Epic    In the past 12 months has the electric, gas, oil, or water  company threatened to shut off services in your home?: No  Health Literacy: Adequate Health Literacy (06/15/2024)   B1300 Health Literacy    Frequency of need for help with medical instructions: Never   Past Surgical History:  Procedure Laterality Date   ANTERIOR CERVICAL DECOMPRESSION/DISCECTOMY FUSION 4 LEVELS N/A 08/11/2017   Procedure: Cervical three to Cervical seven Anterior Cervical discectomy with fusion/plate fixation;  Surgeon: Ditty, Morene Hicks, MD;  Location: [facility] OR;  Service: Neurosurgery;  Laterality: N/A;   BACK SURGERY     HERNIA REPAIR      inguinal hernia repair in early 2000   LEG SURGERY     POSTERIOR CERVICAL FUSION/FORAMINOTOMY N/A 08/12/2018   Procedure: POSTERIOR CERVICAL FUSION WITH LATERAL MASS FIXATION CERVICAL THREE TO CERVICAL SEVEN;  Surgeon: Lanis Pupa, MD;  Location: [facility] OR;  Service: Neurosurgery;  Laterality: N/A;   SHOULDER ARTHROSCOPY WITH ROTATOR CUFF REPAIR AND SUBACROMIAL DECOMPRESSION Right 02/02/2018   Procedure: RIGHT SHOULDER ARTHROSCOPY WITH SUBACROMIAL DECOMPRESSION, MINI-OPEN ROTATOR CUFF REPAIR;  Surgeon: Addie Cordella Hamilton, MD;  Location: [facility] OR;  Service: Orthopedics;  Laterality: Right;   SHOULDER SURGERY Right    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   Procedure: LEFT TOTAL HIP ARTHROPLASTY ANTERIOR APPROACH;  Surgeon: Jerri Kay HERO, MD;  Location: [facility] OR;  Service: Orthopedics;  Laterality: Left;   Past Surgical History:  Procedure Laterality Date   ANTERIOR CERVICAL DECOMPRESSION/DISCECTOMY FUSION 4 LEVELS N/A 08/11/2017   Procedure: Cervical three to Cervical seven Anterior Cervical discectomy with fusion/plate fixation;  Surgeon: Ditty, Morene Hicks, MD;  Location: [facility] OR;  Service: Neurosurgery;  Laterality: N/A;   BACK SURGERY     HERNIA REPAIR     inguinal hernia repair in early 2000   LEG SURGERY     POSTERIOR CERVICAL FUSION/FORAMINOTOMY N/A 08/12/2018   Procedure: POSTERIOR CERVICAL FUSION WITH LATERAL MASS FIXATION CERVICAL THREE TO CERVICAL SEVEN;  Surgeon: Lanis Pupa, MD;  Location: [facility] OR;  Service: Neurosurgery;  Laterality: N/A;   SHOULDER ARTHROSCOPY WITH ROTATOR CUFF REPAIR AND SUBACROMIAL DECOMPRESSION Right 02/02/2018   Procedure: RIGHT SHOULDER ARTHROSCOPY WITH SUBACROMIAL DECOMPRESSION, MINI-OPEN ROTATOR CUFF REPAIR;  Surgeon: Addie Cordella Hamilton, MD;  Location: [facility] OR;  Service: Orthopedics;  Laterality: Right;   SHOULDER SURGERY Right    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   Procedure: LEFT TOTAL HIP ARTHROPLASTY ANTERIOR APPROACH;  Surgeon: Jerri Kay HERO,  MD;  Location: [facility] OR;  Service: Orthopedics;  Laterality: Left;   Past Medical History:  Diagnosis Date   Arthritis    Cerebral palsy (HCC)    Cervical radiculopathy    H/O umbilical hernia repair 2001   Pre-diabetes    Scoliosis    Spondylosis of cervical spine    BP 126/82 (BP Location: Left Arm, Patient Position: Sitting, Cuff Size: Normal)   Pulse 72   Ht 5' 4 (1.626 m)   Wt 121 lb 12.8 oz (55.2 kg)   SpO2 100%   BMI 20.91 kg/m   Opioid Risk Score:   Fall Risk Score:  `1  Depression screen [facility] 2/9     06/22/2024    9:41 AM 06/15/2024   10:42 AM 04/27/2024   10:24 AM 03/29/2024    1:21 PM 03/03/2024    9:52 AM 12/02/2023    1:45 PM 11/05/2023   10:58 AM  Depression screen PHQ 2/9  Decreased Interest 3 0 0  1 0 0  Down, Depressed, Hopeless 2 0 0 0 1 0 0  PHQ - 2 Score 5 0 0 0 2 0 0  Altered sleeping 3  0 0 1    Tired, decreased energy 0  0 0 1    Change in appetite 2  0 0 0    Feeling bad or failure about yourself  1  0 0 0    Trouble concentrating 0  0 0 0    Moving slowly or fidgety/restless 0  0 0 0    Suicidal thoughts 0  0 0 0    PHQ-9 Score 11   0  0  4     Difficult doing work/chores Somewhat difficult   Somewhat difficult Somewhat difficult       Data saved with a previous flowsheet row definition      Review of Systems  Musculoskeletal:  Positive for arthralgias and back pain.       Bilateral shoulder pain, low back pain, left hip, knee and foot pain  All other systems reviewed and are negative.      Objective:   Physical Exam Vitals and nursing note reviewed.  Constitutional:  Appearance: Normal appearance.  Neck:     Comments: Cervical Paraspinal Tenderness: C-4-C-6  Cardiovascular:     Rate and Rhythm: Normal rate and regular rhythm.     Pulses: Normal pulses.     Heart sounds: Normal heart sounds.  Pulmonary:     Effort: Pulmonary effort is normal.     Breath sounds: Normal breath sounds.  Musculoskeletal:     Comments: Normal  Muscle Bulk and Muscle Testing Reveals:  Upper Extremities: Full ROM and Muscle Strength 5/5 Bilateral AC Joint Tenderness: L>R   Lumbar Paraspinal Tenderness: L-4-L-5 Lower Extremities: Full ROM and Muscle Strength 5/5 Wearing Left ankle brace  Arises from Table slowly using cane for support Antalgic  Gait     Skin:    General: Skin is warm and dry.  Neurological:     Mental Status: He is alert and oriented to person, place, and time.  Psychiatric:        Mood and Affect: Mood normal.        Behavior: Behavior normal.          Assessment & Plan:  Cervicalgia/ Cervical Radiculitis: Continue HEP as Tolerated. Continue to Monitor. Continue Gabapentin . Continue to Monitor. 10/18/2024 Chronic Bilateral  Shoulder Pain: L>R  Continue HEP as Tolerated. Continue to Monitor. Ortho Following. 10/18/2024 Failed Back Syndrome: Continue HEP as Tolerated. Continue to Monitor. 10/18/2024 Chronic Bilateral Low Back Pain without Sciatica: Continue HEP as Tolerated. Continue current medication regimen. Continue to Monitor. 10/18/2024 Chronic Left Hip Pain: S/P On 09/10/2020: Dr Jerri LEFT TOTAL HIP ARTHROPLASTY ANTERIOR APPROACH Continue HEP as Tolerated. Continue current medication regimen. Continue to Monitor. 10/18/2024 Chronic Left knee Pain: Continue HEP as Tolerated. Continue to Monitor.  Continue to Monitor. 10/18/2024 Left Foot Drop: He is wearing Ankle Brace. Ortho and [facility] Following. Continue to Monitor. 10/18/2024 Chronic Pain Syndrome: Refilled Oxycodone  10/325 mg one tablet every 4 hours as needed for pain #180.  We will continue the opioid monitoring program, this consists of regular clinic visits, examinations, urine drug screen, pill counts as well as use of [place]  Controlled Substance Reporting system. A 12 month History has been reviewed on the [place]  Controlled Substance Reporting System on 10/18/2024   F/U in 1 month   "

## 2024-10-27 ENCOUNTER — Ambulatory Visit: Admitting: Urology

## 2024-10-27 NOTE — Progress Notes (Unsigned)
 "  Assessment: 1. Lower urinary tract symptoms     Plan: Continue solifenacin  10 mg daily. Continue tamsulosin . Return to office in 4 months  Chief Complaint:  No chief complaint on file.   History of Present Illness:  Philip Bates is a 64 y.o. male who is seen for further evaluation of lower urinary tract symptoms. At his visit in June 2025, he reported symptoms for approximately 1 year.  His symptoms included frequency, voiding every 2-3 hours, nocturia x 2, urgency, and occasional urge incontinence.  He was voiding with a good stream and felt like he emptied his bladder completely.  No dysuria or gross hematuria.  No history of UTIs or prostatitis. He had been on tamsulosin  for approximately 1 year without a significant change in his symptoms.  He reported some dizziness associated with the tamsulosin . IPSS = 14/4. PVR = 40 ml.  PSA results: 9/22 1.54 6/25 1.3  He was given a trial of solifenacin  5 mg daily.  At his visit in August 2025, he continued on solifenacin  5 mg daily.  He was also taking tamsulosin  0.4 mg daily.  He had not seen a significant improvement in his lower urinary tract symptoms.  He continued with urgency, frequency, and nocturia x 3.  He was also having occasional urge incontinence.  No dysuria or gross hematuria.  No side effects from the solifenacin . IPSS = 20/6. PVR = 0 ml. His dose of solifenacin  was increased to 10 mg daily.  At his visit in September 2025, he continued on tamsulosin  and solifenacin  10 mg daily with noted improvement in his urinary symptoms.  He had decreased urgency, frequency, and nocturia.  No incontinence.  No side effects. IPSS = 18/2. PVR = 83 mL.    Portions of the above documentation were copied from a prior visit for review purposes only.   Past Medical History:  Past Medical History:  Diagnosis Date   Arthritis    Cerebral palsy (HCC)    Cervical radiculopathy    H/O umbilical hernia repair 2001   Pre-diabetes     Scoliosis    Spondylosis of cervical spine     Past Surgical History:  Past Surgical History:  Procedure Laterality Date   ANTERIOR CERVICAL DECOMPRESSION/DISCECTOMY FUSION 4 LEVELS N/A 08/11/2017   Procedure: Cervical three to Cervical seven Anterior Cervical discectomy with fusion/plate fixation;  Surgeon: Ditty, Morene Hicks, MD;  Location: [facility] OR;  Service: Neurosurgery;  Laterality: N/A;   BACK SURGERY     HERNIA REPAIR     inguinal hernia repair in early 2000   LEG SURGERY     POSTERIOR CERVICAL FUSION/FORAMINOTOMY N/A 08/12/2018   Procedure: POSTERIOR CERVICAL FUSION WITH LATERAL MASS FIXATION CERVICAL THREE TO CERVICAL SEVEN;  Surgeon: Lanis Pupa, MD;  Location: [facility] OR;  Service: Neurosurgery;  Laterality: N/A;   SHOULDER ARTHROSCOPY WITH ROTATOR CUFF REPAIR AND SUBACROMIAL DECOMPRESSION Right 02/02/2018   Procedure: RIGHT SHOULDER ARTHROSCOPY WITH SUBACROMIAL DECOMPRESSION, MINI-OPEN ROTATOR CUFF REPAIR;  Surgeon: Addie Cordella Hamilton, MD;  Location: [facility] OR;  Service: Orthopedics;  Laterality: Right;   SHOULDER SURGERY Right    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   Procedure: LEFT TOTAL HIP ARTHROPLASTY ANTERIOR APPROACH;  Surgeon: Jerri Kay HERO, MD;  Location: [facility] OR;  Service: Orthopedics;  Laterality: Left;    Allergies:  No Known Allergies  Family History:  Family History  Problem Relation Age of Onset   Esophageal cancer Father    Cancer Father  esophagus   Diabetes Brother    Diabetes Paternal Uncle    Colon cancer Neg Hx    Rectal cancer Neg Hx    Stomach cancer Neg Hx     Social History:  Social History   Tobacco Use   Smoking status: Every Day    Current packs/day: 1.00    Average packs/day: 1 pack/day for 35.0 years (35.0 ttl pk-yrs)    Types: Cigarettes   Smokeless tobacco: Never  Vaping Use   Vaping status: Never Used  Substance Use Topics   Alcohol  use: Never   Drug use: Never    ROS: Constitutional:  Negative for fever,  chills, weight loss CV: Negative for chest pain, previous MI, hypertension Respiratory:  Negative for shortness of breath, wheezing, sleep apnea, frequent cough GI:  Negative for nausea, vomiting, bloody stool, GERD  Physical exam: There were no vitals taken for this visit. GENERAL APPEARANCE:  Well appearing, well developed, well nourished, NAD HEENT:  Atraumatic, normocephalic, oropharynx clear NECK:  Supple without lymphadenopathy or thyromegaly ABDOMEN:  Soft, non-tender, no masses EXTREMITIES:  Moves all extremities well, without clubbing, cyanosis, or edema NEUROLOGIC:  Alert and oriented x 3, normal gait, CN II-XII grossly intact MENTAL STATUS:  appropriate BACK:  Non-tender to palpation, No CVAT SKIN:  Warm, dry, and intact   Results: U/A:  "

## 2024-11-08 ENCOUNTER — Encounter: Payer: Self-pay | Admitting: Hematology and Oncology

## 2024-11-08 ENCOUNTER — Encounter: Payer: Self-pay | Admitting: Physician Assistant

## 2024-11-21 ENCOUNTER — Encounter: Admitting: Registered Nurse

## 2025-01-06 ENCOUNTER — Other Ambulatory Visit

## 2025-01-06 ENCOUNTER — Ambulatory Visit: Admitting: Physician Assistant

## 2025-06-21 ENCOUNTER — Ambulatory Visit
